# Patient Record
Sex: Male | Born: 1950 | Race: Black or African American | Hispanic: No | Smoking: Former smoker
Health system: Southern US, Community
[De-identification: ages and names within clinical notes are randomized; demographics above are authoritative.]

## PROBLEM LIST (undated history)

## (undated) ENCOUNTER — Ambulatory Visit (HOSPITAL_COMMUNITY): Admission: EM

## (undated) DIAGNOSIS — R011 Cardiac murmur, unspecified: Secondary | ICD-10-CM

## (undated) DIAGNOSIS — I10 Essential (primary) hypertension: Secondary | ICD-10-CM

## (undated) DIAGNOSIS — L932 Other local lupus erythematosus: Secondary | ICD-10-CM

## (undated) DIAGNOSIS — I509 Heart failure, unspecified: Secondary | ICD-10-CM

## (undated) DIAGNOSIS — M329 Systemic lupus erythematosus, unspecified: Secondary | ICD-10-CM

## (undated) DIAGNOSIS — N183 Chronic kidney disease, stage 3 unspecified: Secondary | ICD-10-CM

## (undated) DIAGNOSIS — E785 Hyperlipidemia, unspecified: Secondary | ICD-10-CM

## (undated) DIAGNOSIS — R972 Elevated prostate specific antigen [PSA]: Secondary | ICD-10-CM

## (undated) DIAGNOSIS — I519 Heart disease, unspecified: Secondary | ICD-10-CM

## (undated) DIAGNOSIS — I4729 Other ventricular tachycardia: Secondary | ICD-10-CM

## (undated) DIAGNOSIS — K649 Unspecified hemorrhoids: Secondary | ICD-10-CM

## (undated) DIAGNOSIS — Z87898 Personal history of other specified conditions: Secondary | ICD-10-CM

## (undated) DIAGNOSIS — R4781 Slurred speech: Secondary | ICD-10-CM

## (undated) DIAGNOSIS — E7800 Pure hypercholesterolemia, unspecified: Secondary | ICD-10-CM

## (undated) DIAGNOSIS — F1410 Cocaine abuse, uncomplicated: Secondary | ICD-10-CM

## (undated) DIAGNOSIS — N1831 Chronic kidney disease, stage 3a: Secondary | ICD-10-CM

## (undated) DIAGNOSIS — I69351 Hemiplegia and hemiparesis following cerebral infarction affecting right dominant side: Secondary | ICD-10-CM

## (undated) DIAGNOSIS — R269 Unspecified abnormalities of gait and mobility: Secondary | ICD-10-CM

## (undated) DIAGNOSIS — N401 Enlarged prostate with lower urinary tract symptoms: Secondary | ICD-10-CM

## (undated) DIAGNOSIS — F1990 Other psychoactive substance use, unspecified, uncomplicated: Secondary | ICD-10-CM

## (undated) DIAGNOSIS — R351 Nocturia: Secondary | ICD-10-CM

## (undated) DIAGNOSIS — F1010 Alcohol abuse, uncomplicated: Secondary | ICD-10-CM

## (undated) DIAGNOSIS — Z8673 Personal history of transient ischemic attack (TIA), and cerebral infarction without residual deficits: Secondary | ICD-10-CM

## (undated) DIAGNOSIS — I130 Hypertensive heart and chronic kidney disease with heart failure and stage 1 through stage 4 chronic kidney disease, or unspecified chronic kidney disease: Secondary | ICD-10-CM

## (undated) DIAGNOSIS — I472 Ventricular tachycardia: Secondary | ICD-10-CM

## (undated) DIAGNOSIS — IMO0002 Reserved for concepts with insufficient information to code with codable children: Secondary | ICD-10-CM

## (undated) DIAGNOSIS — F32A Depression, unspecified: Secondary | ICD-10-CM

## (undated) DIAGNOSIS — I639 Cerebral infarction, unspecified: Secondary | ICD-10-CM

## (undated) DIAGNOSIS — N179 Acute kidney failure, unspecified: Secondary | ICD-10-CM

## (undated) DIAGNOSIS — D649 Anemia, unspecified: Secondary | ICD-10-CM

## (undated) DIAGNOSIS — N400 Enlarged prostate without lower urinary tract symptoms: Secondary | ICD-10-CM

## (undated) HISTORY — PX: TONSILLECTOMY: SUR1361

## (undated) HISTORY — DX: Hemiplegia and hemiparesis following cerebral infarction affecting right dominant side: I69.351

## (undated) HISTORY — DX: Reserved for concepts with insufficient information to code with codable children: IMO0002

## (undated) HISTORY — DX: Chronic kidney disease, stage 3a: N18.31

## (undated) HISTORY — DX: Chronic kidney disease, stage 3 unspecified: N18.30

## (undated) HISTORY — DX: Personal history of other specified conditions: Z87.898

## (undated) HISTORY — DX: Cardiac murmur, unspecified: R01.1

## (undated) HISTORY — PX: PROSTATE BIOPSY: SHX241

## (undated) HISTORY — DX: Hyperlipidemia, unspecified: E78.5

## (undated) HISTORY — PX: KNEE ARTHROSCOPY: SHX127

## (undated) HISTORY — DX: Hypertensive heart and chronic kidney disease with heart failure and stage 1 through stage 4 chronic kidney disease, or unspecified chronic kidney disease: I13.0

## (undated) HISTORY — DX: Unspecified hemorrhoids: K64.9

## (undated) HISTORY — DX: Heart failure, unspecified: I50.9

## (undated) HISTORY — DX: Systemic lupus erythematosus, unspecified: M32.9

## (undated) HISTORY — DX: Benign prostatic hyperplasia without lower urinary tract symptoms: N40.0

---
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
## 2001-03-11 ENCOUNTER — Emergency Department (HOSPITAL_COMMUNITY): Admission: EM | Admit: 2001-03-11 | Discharge: 2001-03-11 | Payer: Self-pay | Admitting: Emergency Medicine

## 2001-03-11 ENCOUNTER — Encounter: Payer: Self-pay | Admitting: Emergency Medicine

## 2002-04-17 ENCOUNTER — Emergency Department (HOSPITAL_COMMUNITY): Admission: EM | Admit: 2002-04-17 | Discharge: 2002-04-17 | Payer: Self-pay | Admitting: Emergency Medicine

## 2012-05-21 DIAGNOSIS — J45909 Unspecified asthma, uncomplicated: Secondary | ICD-10-CM

## 2012-05-21 HISTORY — DX: Unspecified asthma, uncomplicated: J45.909

## 2013-03-05 ENCOUNTER — Ambulatory Visit (INDEPENDENT_AMBULATORY_CARE_PROVIDER_SITE_OTHER): Payer: BC Managed Care – PPO | Admitting: Internal Medicine

## 2013-03-19 ENCOUNTER — Ambulatory Visit (INDEPENDENT_AMBULATORY_CARE_PROVIDER_SITE_OTHER): Payer: BC Managed Care – PPO | Admitting: Internal Medicine

## 2013-03-19 ENCOUNTER — Encounter: Payer: Self-pay | Admitting: Internal Medicine

## 2013-03-19 ENCOUNTER — Encounter (HOSPITAL_COMMUNITY): Payer: Self-pay | Admitting: *Deleted

## 2013-03-19 VITALS — BP 140/70 | HR 74 | Ht 74.0 in | Wt 207.7 lb

## 2013-03-19 DIAGNOSIS — I517 Cardiomegaly: Secondary | ICD-10-CM

## 2013-03-19 DIAGNOSIS — R011 Cardiac murmur, unspecified: Secondary | ICD-10-CM

## 2013-03-19 DIAGNOSIS — R9431 Abnormal electrocardiogram [ECG] [EKG]: Secondary | ICD-10-CM

## 2013-03-19 DIAGNOSIS — Z01810 Encounter for preprocedural cardiovascular examination: Secondary | ICD-10-CM

## 2013-03-19 NOTE — Progress Notes (Signed)
OFFICE NOTE  Chief Complaint:  Preoperative cardiovascular exam, murmur  Primary Care Physician: Irven Shelling, MD  HPI:  Isaac Reyes is a pleasant 62 year old male who was recently scheduled for foot surgery with Dr. Doran Durand. During preoperative evaluation he was noted to have an abnormal EKG showing possible ischemic changes, premature atrial contractions and was noted to be hypertensive. History was canceled and he was referred for evaluation in our office. Isaac Reyes has no significant past medical history that he reports. There is a family history of heart disease in his father died of heart attack at age 61 and also has hypertension. His brother also died but not of heart disease. He denies any chest pain or worsening shortness of breath recently and has been told that he has high blood pressure intermittently in the past. He underwent an EKG preoperatively which showed left ventricular hypertrophy, PACs, and inferior lateral T-wave inversions. This is corroborated with his EKG today suggesting possible global ischemia or apical variant hypertrophic cardiomyopathy. He was also heard to have a cardiac murmur and is here for evaluation of that. He does report being very active and denies any shortness of breath with exertion as mentioned or limiting symptoms. 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  PMHx:  Past Medical History  Diagnosis Date  . Lupus     Past Surgical History  Procedure Laterality Date  . Knee surgery      FAMHx:  Family History  Problem Relation Age of Onset  . Heart attack Father     SOCHx:   reports that he has quit smoking. His smoking use included Cigars. He has never used smokeless tobacco. He reports that he drinks about 1.0 ounces of alcohol per week. He reports that he does not use illicit drugs.  ALLERGIES:  No Known Allergies  ROS: A comprehensive review of systems was negative except for:  Musculoskeletal: positive for bunions, foot pain, LE swelling  HOME MEDS: Current Outpatient Prescriptions  Medication Sig Dispense Refill  . Acetaminophen (TYLENOL PO) Take by mouth as needed.       No current facility-administered medications for this visit.    LABS/IMAGING: No results found for this or any previous visit (from the past 48 hour(s)). No results found.  VITALS: BP 140/70  Pulse 74  Ht 6\' 2"  (1.88 m)  Wt 207 lb 11.2 oz (94.212 kg)  BMI 26.66 kg/m2  EXAM: General appearance: alert and no distress Neck: no carotid bruit and no JVD Lungs: clear to auscultation bilaterally Heart: regular rate and rhythm, S1, S2 normal and systolic murmur: early systolic 2/6, blowing at apex Abdomen: soft, non-tender; bowel sounds normal; no masses,  no organomegaly Extremities: extremities normal, atraumatic, no cyanosis or edema Pulses: 2+ and symmetric Skin: Skin color, texture, turgor normal. No rashes or lesions Neurologic: Grossly normal Psych: Mood, affect normal  EKG: Normal sinus rhythm at 74, full criteria for LVH, inferior and lateral T-wave inversions  ASSESSMENT: 1. Systolic murmur heard best at the apex 2. Abnormal EKG suggesting ischemia versus apical variant hypertrophic cardiomyopathy 3. Probable undiagnosed hypertension 4. Indeterminate preoperative cardiovascular risk  PLAN: 1.   Isaac Reyes has an EKG which is markedly abnormal, suggesting either hypertensive heart disease, apical variant hypertrophic cardiomyopathy or ischemia. He has not had ischemic symptoms, however an ischemia workup is indicated preoperatively. I would recommend a LexiScan nuclear stress test as he has difficulty walking with his foot problems. In addition,  an echocardiogram will be helpful to assess for structural changes of the heart and/or hypertrophy. He may need treatment with an ACE inhibitor or ARB as his blood pressure I suspect has been elevated for some time. We'll plan to see  him back in a few weeks to discuss results of his tests and re\re check his blood pressure at that time. He continues to be elevated, again I would recommend starting medication.  Thanks again for the kind referral. Please don't hesitate to contact me with questions.  Pixie Casino, MD, [facility] Attending Cardiologist [facility]  Aundrea Horace C 03/19/2013, 11:22 AM

## 2013-03-19 NOTE — Patient Instructions (Addendum)
Your physician has requested that you have a lexiscan myoview. For further information please visit [email or web address]. Please follow instruction sheet, as given.  Your physician has requested that you have an echocardiogram. Echocardiography is a painless test that uses sound waves to create images of your heart. It provides your doctor with information about the size and shape of your heart and how well your heart's chambers and valves are working. This procedure takes approximately one hour. There are no restrictions for this procedure.  These tests are both done in our office.  Please schedule a follow up visit with Dr. Debara Pickett after your tests.

## 2013-04-03 ENCOUNTER — Ambulatory Visit (HOSPITAL_COMMUNITY)
Admission: RE | Admit: 2013-04-03 | Discharge: 2013-04-03 | Disposition: A | Discharge status: Home / Self Care | Payer: BC Managed Care – PPO | Source: Ambulatory Visit | Attending: Cardiovascular Disease | Admitting: Cardiovascular Disease

## 2013-04-03 DIAGNOSIS — Z01810 Encounter for preprocedural cardiovascular examination: Secondary | ICD-10-CM

## 2013-04-03 DIAGNOSIS — R9431 Abnormal electrocardiogram [ECG] [EKG]: Secondary | ICD-10-CM | POA: Insufficient documentation

## 2013-04-03 MED ORDER — TECHNETIUM TC 99M SESTAMIBI GENERIC - CARDIOLITE
30.2000 | Freq: Once | INTRAVENOUS | Status: AC | PRN
  Administered 2013-04-03: 30.2 via INTRAVENOUS

## 2013-04-03 MED ORDER — REGADENOSON 0.4 MG/5ML IV SOLN
0.4000 mg | Freq: Once | INTRAVENOUS | Status: AC
  Administered 2013-04-03: 0.4 mg via INTRAVENOUS

## 2013-04-03 MED ORDER — TECHNETIUM TC 99M SESTAMIBI GENERIC - CARDIOLITE
10.4000 | Freq: Once | INTRAVENOUS | Status: AC | PRN
  Administered 2013-04-03: 10 via INTRAVENOUS

## 2013-04-03 NOTE — Procedures (Addendum)
[place] [address] [place] [id number]  Cardiology Nuclear Med Study  Isaac Reyes is a 62 y.o. male     MRN : [id number]     DOB: [birth year]  Procedure Date: 04/03/2013  Nuclear Med Background Indication for Stress Test:  Surgical Clearance and Abnormal EKG History:  murmur;lupus Cardiac Risk Factors: Family History - CAD and History of Smoking  Symptoms:  Pt denies symptoms   Nuclear Pre-Procedure Caffeine/Decaff Intake:  12:00am NPO After: 10am   IV Site: R Forearm  IV 0.9% NS with Angio Cath:  22g  Chest Size (in):  42"  IV Started by: Azucena Cecil, RN  Height: 6\' 2"  (1.88 m)  Cup Size: n/a  BMI:  Body mass index is 26.57 kg/(m^2). Weight:  207 lb (93.895 kg)   Tech Comments:  n/a    Nuclear Med Study 1 or 2 day study: 1 day  Stress Test Type:  [place] Provider:  Lyman Bishop, MD   Resting Radionuclide: Technetium 62m Sestamibi  Resting Radionuclide Dose: 10.4 mCi   Stress Radionuclide:  Technetium 1m Sestamibi  Stress Radionuclide Dose: 30.2 mCi           Stress Protocol Rest HR: 67 Stress HR:  76  Rest BP: 177/114 Stress BP: 181/119  Exercise Time (min): n/a METS: n/a   Predicted Max HR: 158 bpm % Max HR: 53.8 bpm Rate Pressure Product: 15385  Dose of Adenosine (mg):  n/a Dose of Lexiscan: 0.4 mg  Dose of Atropine (mg): n/a Dose of Dobutamine: n/a mcg/kg/min (at max HR)  Stress Test Technologist: Leane Para, CCT Nuclear Technologist: Imagene Riches, CNMT   Rest Procedure:  Myocardial perfusion imaging was performed at rest 45 minutes following the intravenous administration of Technetium 52m Sestamibi. Stress Procedure:  The patient received IV Lexiscan 0.4 mg over 15-seconds.  Technetium 36m Sestamibi injected at 30-seconds.  There were no significant changes with Lexiscan.  Quantitative spect images were obtained after a 45 minute  delay.  Transient Ischemic Dilatation (Normal <1.22):  1.23 Lung/Heart Ratio (Normal <0.45):  0.24 QGS EDV:  231 ml QGS ESV:  142 ml LV Ejection Fraction: 39%   Rest ECG: NSR-LVH  Stress ECG: No significant change from baseline ECG  QPS Raw Data Images:  Marked cardiomegaly is present. Mild diaphragmatic attenuation is seen. Stress Images:  Normal homogeneous uptake in all areas of the myocardium. Rest Images:  Normal homogeneous uptake in all areas of the myocardium. Subtraction (SDS):  No evidence of ischemia. LV Wall Motion:  Moderate global LV hypokinesis with calculated EF of 39%  Impression Exercise Capacity:  Lexiscan with no exercise. BP Response:  Severe baseline hypertension Clinical Symptoms:  No significant symptoms noted. ECG Impression:  No significant ECG changes with Lexiscan. 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   Sanda Klein, MD  04/03/2013 5:23 PM

## 2013-04-06 ENCOUNTER — Ambulatory Visit (HOSPITAL_COMMUNITY)
Admission: RE | Admit: 2013-04-06 | Discharge: 2013-04-06 | Disposition: A | Discharge status: Home / Self Care | Payer: BC Managed Care – PPO | Source: Ambulatory Visit | Attending: Cardiology | Admitting: Cardiology

## 2013-04-06 DIAGNOSIS — Z01810 Encounter for preprocedural cardiovascular examination: Secondary | ICD-10-CM

## 2013-04-06 DIAGNOSIS — R9431 Abnormal electrocardiogram [ECG] [EKG]: Secondary | ICD-10-CM | POA: Insufficient documentation

## 2013-04-06 NOTE — Progress Notes (Signed)
2D Echo Performed 04/06/2013    Marygrace Drought, RCS

## 2013-04-15 ENCOUNTER — Ambulatory Visit: Payer: BC Managed Care – PPO | Admitting: Internal Medicine

## 2013-04-22 NOTE — Progress Notes (Signed)
Left message with patient's mother for him to return call.

## 2017-06-21 DIAGNOSIS — Z8679 Personal history of other diseases of the circulatory system: Secondary | ICD-10-CM

## 2017-06-21 DIAGNOSIS — I639 Cerebral infarction, unspecified: Secondary | ICD-10-CM

## 2017-06-21 HISTORY — DX: Cerebral infarction, unspecified: I63.9

## 2017-06-21 HISTORY — DX: Personal history of other diseases of the circulatory system: Z86.79

## 2017-07-17 ENCOUNTER — Other Ambulatory Visit: Payer: Self-pay

## 2017-07-17 ENCOUNTER — Emergency Department (HOSPITAL_COMMUNITY): Payer: Medicare Other

## 2017-07-17 ENCOUNTER — Inpatient Hospital Stay (HOSPITAL_COMMUNITY)
Admission: EM | Admit: 2017-07-17 | Discharge: 2017-07-19 | DRG: 065 | Disposition: A | Discharge status: Home / Self Care | Payer: Medicare Other | Attending: Internal Medicine | Admitting: Internal Medicine

## 2017-07-17 ENCOUNTER — Encounter (HOSPITAL_COMMUNITY): Payer: Self-pay | Admitting: *Deleted

## 2017-07-17 DIAGNOSIS — E440 Moderate protein-calorie malnutrition: Secondary | ICD-10-CM | POA: Diagnosis present

## 2017-07-17 DIAGNOSIS — D649 Anemia, unspecified: Secondary | ICD-10-CM

## 2017-07-17 DIAGNOSIS — R296 Repeated falls: Secondary | ICD-10-CM | POA: Diagnosis not present

## 2017-07-17 DIAGNOSIS — G8194 Hemiplegia, unspecified affecting left nondominant side: Secondary | ICD-10-CM | POA: Diagnosis not present

## 2017-07-17 DIAGNOSIS — I472 Ventricular tachycardia: Secondary | ICD-10-CM | POA: Diagnosis present

## 2017-07-17 DIAGNOSIS — R4701 Aphasia: Secondary | ICD-10-CM | POA: Diagnosis present

## 2017-07-17 DIAGNOSIS — N289 Disorder of kidney and ureter, unspecified: Secondary | ICD-10-CM | POA: Diagnosis not present

## 2017-07-17 DIAGNOSIS — R030 Elevated blood-pressure reading, without diagnosis of hypertension: Secondary | ICD-10-CM | POA: Diagnosis present

## 2017-07-17 DIAGNOSIS — Z8673 Personal history of transient ischemic attack (TIA), and cerebral infarction without residual deficits: Secondary | ICD-10-CM

## 2017-07-17 DIAGNOSIS — I639 Cerebral infarction, unspecified: Secondary | ICD-10-CM | POA: Diagnosis not present

## 2017-07-17 DIAGNOSIS — E785 Hyperlipidemia, unspecified: Secondary | ICD-10-CM | POA: Diagnosis not present

## 2017-07-17 DIAGNOSIS — F17200 Nicotine dependence, unspecified, uncomplicated: Secondary | ICD-10-CM | POA: Diagnosis present

## 2017-07-17 DIAGNOSIS — Z8249 Family history of ischemic heart disease and other diseases of the circulatory system: Secondary | ICD-10-CM

## 2017-07-17 DIAGNOSIS — R4781 Slurred speech: Secondary | ICD-10-CM | POA: Diagnosis not present

## 2017-07-17 DIAGNOSIS — R471 Dysarthria and anarthria: Secondary | ICD-10-CM | POA: Diagnosis not present

## 2017-07-17 DIAGNOSIS — I4729 Other ventricular tachycardia: Secondary | ICD-10-CM

## 2017-07-17 DIAGNOSIS — R7303 Prediabetes: Secondary | ICD-10-CM | POA: Diagnosis not present

## 2017-07-17 DIAGNOSIS — I10 Essential (primary) hypertension: Secondary | ICD-10-CM | POA: Diagnosis not present

## 2017-07-17 DIAGNOSIS — F1010 Alcohol abuse, uncomplicated: Secondary | ICD-10-CM | POA: Diagnosis not present

## 2017-07-17 HISTORY — DX: Other ventricular tachycardia: I47.29

## 2017-07-17 HISTORY — DX: Ventricular tachycardia: I47.2

## 2017-07-17 HISTORY — DX: Anemia, unspecified: D64.9

## 2017-07-17 HISTORY — DX: Cerebral infarction, unspecified: I63.9

## 2017-07-17 LAB — BASIC METABOLIC PANEL
ANION GAP: 10 (ref 5–15)
BUN: 16 mg/dL (ref 6–20)
CHLORIDE: 101 mmol/L (ref 101–111)
CO2: 27 mmol/L (ref 22–32)
CREATININE: 1.3 mg/dL — AB (ref 0.61–1.24)
Calcium: 9.2 mg/dL (ref 8.9–10.3)
GFR calc Af Amer: >60 mL/min (ref >60)
GFR calc non Af Amer: 55 mL/min — ABNORMAL LOW (ref >60)
Glucose, Bld: 101 mg/dL — ABNORMAL HIGH (ref 65–99)
Potassium: 3.6 mmol/L (ref 3.5–5.1)
SODIUM: 138 mmol/L (ref 135–145)

## 2017-07-17 LAB — CBC WITH DIFFERENTIAL/PLATELET
Basophils Absolute: 0 10*3/uL (ref 0.0–0.1)
Basophils Relative: 1 %
EOS ABS: 0.2 10*3/uL (ref 0.0–0.7)
Eosinophils Relative: 6 %
HCT: 39.6 % (ref 39.0–52.0)
Hemoglobin: 12.4 g/dL — ABNORMAL LOW (ref 13.0–17.0)
LYMPHS ABS: 1.3 10*3/uL (ref 0.7–4.0)
Lymphocytes Relative: 38 %
MCH: 25.9 pg — AB (ref 26.0–34.0)
MCHC: 31.3 g/dL (ref 30.0–36.0)
MCV: 82.7 fL (ref 78.0–100.0)
Monocytes Absolute: 0.3 10*3/uL (ref 0.1–1.0)
Monocytes Relative: 10 %
NEUTROS PCT: 45 %
Neutro Abs: 1.6 10*3/uL — ABNORMAL LOW (ref 1.7–7.7)
Platelets: 175 10*3/uL (ref 150–400)
RBC: 4.79 MIL/uL (ref 4.22–5.81)
RDW: 15.2 % (ref 11.5–15.5)
WBC: 3.4 10*3/uL — ABNORMAL LOW (ref 4.0–10.5)

## 2017-07-17 LAB — URINALYSIS, ROUTINE W REFLEX MICROSCOPIC
Bilirubin Urine: NEGATIVE
Glucose, UA: NEGATIVE mg/dL
Hgb urine dipstick: NEGATIVE
Ketones, ur: NEGATIVE mg/dL
Leukocytes, UA: NEGATIVE
NITRITE: NEGATIVE
Protein, ur: NEGATIVE mg/dL
SPECIFIC GRAVITY, URINE: 1.015 (ref 1.005–1.030)
pH: 6 (ref 5.0–8.0)

## 2017-07-17 NOTE — ED Notes (Signed)
Results reviewed, no change in acuity at this time

## 2017-07-17 NOTE — Discharge Instructions (Addendum)
Fall Prevention in the Home Falls can cause injuries. They can happen to people of all ages. There are many things you can do to make your home safe and to help prevent falls. What can I do on the outside of my home?  Regularly fix the edges of walkways and driveways and fix any cracks.  Remove anything that might make you trip as you walk through a door, such as a raised step or threshold.  Trim any bushes or trees on the path to your home.  Use bright outdoor lighting.  Clear any walking paths of anything that might make someone trip, such as rocks or tools.  Regularly check to see if handrails are loose or broken. Make sure that both sides of any steps have handrails.  Any raised decks and porches should have guardrails on the edges.  Have any leaves, snow, or ice cleared regularly.  Use sand or salt on walking paths during winter.  Clean up any spills in your garage right away. This includes oil or grease spills. What can I do in the bathroom?  Use night lights.  Install grab bars by the toilet and in the tub and shower. Do not use towel bars as grab bars.  Use non-skid mats or decals in the tub or shower.  If you need to sit down in the shower, use a plastic, non-slip stool.  Keep the floor dry. Clean up any water that spills on the floor as soon as it happens.  Remove soap buildup in the tub or shower regularly.  Attach bath mats securely with double-sided non-slip rug tape.  Do not have throw rugs and other things on the floor that can make you trip. What can I do in the bedroom?  Use night lights.  Make sure that you have a light by your bed that is easy to reach.  Do not use any sheets or blankets that are too big for your bed. They should not hang down onto the floor.  Have a firm chair that has side arms. You can use this for support while you get dressed.  Do not have throw rugs and other things on the floor that can make you trip. What can I do in  the kitchen?  Clean up any spills right away.  Avoid walking on wet floors.  Keep items that you use a lot in easy-to-reach places.  If you need to reach something above you, use a strong step stool that has a grab bar.  Keep electrical cords out of the way.  Do not use floor polish or wax that makes floors slippery. If you must use wax, use non-skid floor wax.  Do not have throw rugs and other things on the floor that can make you trip. What can I do with my stairs?  Do not leave any items on the stairs.  Make sure that there are handrails on both sides of the stairs and use them. Fix handrails that are broken or loose. Make sure that handrails are as long as the stairways.  Check any carpeting to make sure that it is firmly attached to the stairs. Fix any carpet that is loose or worn.  Avoid having throw rugs at the top or bottom of the stairs. If you do have throw rugs, attach them to the floor with carpet tape.  Make sure that you have a light switch at the top of the stairs and the bottom of the stairs. If you  do not have them, ask someone to add them for you. What else can I do to help prevent falls?  Wear shoes that: ? Do not have high heels. ? Have rubber bottoms. ? Are comfortable and fit you well. ? Are closed at the toe. Do not wear sandals.  If you use a stepladder: ? Make sure that it is fully opened. Do not climb a closed stepladder. ? Make sure that both sides of the stepladder are locked into place. ? Ask someone to hold it for you, if possible.  Clearly mark and make sure that you can see: ? Any grab bars or handrails. ? First and last steps. ? Where the edge of each step is.  Use tools that help you move around (mobility aids) if they are needed. These include: ? Canes. ? Walkers. ? Scooters. ? Crutches.  Turn on the lights when you go into a dark area. Replace any light bulbs as soon as they burn out.  Set up your furniture so you have a clear  path. Avoid moving your furniture around.  If any of your floors are uneven, fix them.  If there are any pets around you, be aware of where they are.  Review your medicines with your doctor. Some medicines can make you feel dizzy. This can increase your chance of falling. Ask your doctor what other things that you can do to help prevent falls. This information is not intended to replace advice given to you by your health care provider. Make sure you discuss any questions you have with your health care provider. Document Released: 03/03/2009 Document Revised: 10/13/2015 Document Reviewed: 06/11/2014 Elsevier Interactive Patient Education  2018 [employer].   Ischemic Stroke An ischemic stroke is the sudden death of brain tissue. Blood carries oxygen to all areas of the body. This type of stroke happens when your blood does not flow to your brain like normal. Your brain cannot get the oxygen it needs. This is an emergency. It must be treated right away. Symptoms of a stroke usually happen all of a sudden. You may notice them when you wake up. They can include:  Weakness or loss of feeling in your face, arm, or leg. This often happens on one side of the body.  Trouble walking.  Trouble moving your arms or legs.  Loss of balance or coordination.  Feeling confused.  Trouble talking or understanding what people are saying.  Slurred speech.  Trouble seeing.  Seeing two of one object (double vision).  Feeling dizzy.  Feeling sick to your stomach (nauseous) and throwing up (vomiting).  A very bad headache for no reason.  Get help as soon as any of these problems start. This is important. Some treatments work better if they are given right away. These include:  Aspirin.  Medicines to control blood pressure.  A shot (injection) of medicine to break up the blood clot.  Treatments given in the blood vessel (artery) to take out the clot or break it up.  Other treatments may  include:  Oxygen.  Fluids given through an IV tube.  Medicines to thin out your blood.  Procedures to help your blood flow better.  What increases the risk? Certain things may make you more likely to have a stroke. Some of these are things that you can change, such as:  Being very overweight (obesity).  Smoking.  Taking birth control pills.  Not being active.  Drinking too much alcohol.  Using drugs.  Other  risk factors include:  High blood pressure.  High cholesterol.  Diabetes.  Heart disease.  Being [place] American, Native American, Hispanic, or [place] Native.  Being over age 73.  Family history of stroke.  Having had blood clots, stroke, or warning stroke (transient ischemic attack, TIA) in the past.  Sickle cell disease.  Being a woman with a history of high blood pressure in pregnancy (preeclampsia).  Migraine headache.  Sleep apnea.  Having an irregular heartbeat (atrial fibrillation).  Long-term (chronic) diseases that cause soreness and swelling (inflammation).  Disorders that affect how your blood clots.  Follow these instructions at home: Medicines  Take over-the-counter and prescription medicines only as told by your doctor.  If you were told to take aspirin or another medicine to thin your blood, take it exactly as told by your doctor. ? Taking too much of the medicine can cause bleeding. ? If you do not take enough, it may not work as well.  Know the side effects of your medicines. If you are taking a blood thinner, make sure you: ? Hold pressure over any cuts for longer than usual. ? Tell your dentist and other doctors that you take this medicine. ? Avoid activities that may cause damage or injury to your body. Eating and drinking  Follow instructions from your doctor about what you cannot eat or drink.  Eat healthy foods.  If you have trouble with swallowing, do these things to avoid choking: ? Take small bites when  eating. ? Eat foods that are soft or pureed. Safety  Follow instructions from your health care team about physical activity.  Use a walker or cane as told by your doctor.  Keep your home safe so you do not fall. This may include: ? Having experts look at your home to make sure it is safe. ? Putting grab bars in the bedroom and bathroom. ? Using raised toilets. ? Putting a seat in the shower. General instructions  Do not use any tobacco products. ? Examples of these are cigarettes, chewing tobacco, and e-cigarettes. ? If you need help quitting, ask your doctor.  Limit how much alcohol you drink. This means no more than 1 drink a day for nonpregnant women and 2 drinks a day for men. One drink equals 12 oz of beer, 5 oz of wine, or 1 oz of hard liquor.  If you need help to stop using drugs or alcohol, ask your doctor to refer you to a program or specialist.  Stay active. Exercise as told by your doctor.  Keep all follow-up visits as told by your doctor. This is important. Get help right away if:  You suddenly: ? Have weakness or loss of feeling in your face, arm, or leg. ? Feel confused. ? Have trouble talking or understanding what people are saying. ? Have trouble seeing. ? Have trouble walking. ? Have trouble moving your arms or legs. ? Feel dizzy. ? Lose your balance or coordination. ? Have a very bad headache and you do not know why.  You pass out (lose consciousness) or almost pass out.  You have jerky movements that you cannot control (seizure). These symptoms may be an emergency. Do not wait to see if the symptoms will go away. Get medical help right away. Call your local emergency services (911 in the [place] Do not drive yourself to the hospital. This information is not intended to replace advice given to you by your health care provider. Make sure you discuss any questions  you have with your health care provider. Document Released: 04/26/2011 Document Revised:  10/18/2015 Document Reviewed: 08/03/2015 Elsevier Interactive Patient Education  2018 [employer].   Stroke Prevention Some health problems and behaviors may make it more likely for you to have a stroke. Below are ways to lessen your risk of having a stroke.  Be active for at least 30 minutes on most or all days.  Do not smoke. Try not to be around others who smoke.  Do not drink too much alcohol. ? Do not have more than 2 drinks a day if you are a man. ? Do not have more than 1 drink a day if you are a woman and are not pregnant.  Eat healthy foods, such as fruits and vegetables. If you were put on a specific diet, follow the diet as told.  Keep your cholesterol levels under control through diet and medicines. Look for foods that are low in saturated fat, trans fat, cholesterol, and are high in fiber.  If you have diabetes, follow all diet plans and take your medicine as told.  Ask your doctor if you need treatment to lower your blood pressure. If you have high blood pressure (hypertension), follow all diet plans and take your medicine as told by your doctor.  If you are 76-83 years old, have your blood pressure checked every 3-5 years. If you are age 58 or older, have your blood pressure checked every year.  Keep a healthy weight. Eat foods that are low in calories, salt, saturated fat, trans fat, and cholesterol.  Do not take drugs.  Avoid birth control pills, if this applies. Talk to your doctor about the risks of taking birth control pills.  Talk to your doctor if you have sleep problems (sleep apnea).  Take all medicine as told by your doctor. ? You may be told to take aspirin or blood thinner medicine. Take this medicine as told by your doctor. ? Understand your medicine instructions.  Make sure any other conditions you have are being taken care of.  Get help right away if:  You suddenly lose feeling (you feel numb) or have weakness in your face, arm, or leg.  Your  face or eyelid hangs down to one side.  You suddenly feel confused.  You have trouble talking (aphasia) or understanding what people are saying.  You suddenly have trouble seeing in one or both eyes.  You suddenly have trouble walking.  You are dizzy.  You lose your balance or your movements are clumsy (uncoordinated).  You suddenly have a very bad headache and you do not know the cause.  You have new chest pain.  Your heart feels like it is fluttering or skipping a beat (irregular heartbeat). Do not wait to see if the symptoms above go away. Get help right away. Call your local emergency services (911 in [place] Do not drive yourself to the hospital. This information is not intended to replace advice given to you by your health care provider. Make sure you discuss any questions you have with your health care provider. Document Released: 11/06/2011 Document Revised: 10/13/2015 Document Reviewed: 11/07/2012 Elsevier Interactive Patient Education  [employer]. Please follow up with your PCP and a neurologist.  You need to have further studies done to see if we can do something to prevent further strokes.  Return for any worsening symptoms

## 2017-07-17 NOTE — ED Provider Notes (Signed)
[facility] EMERGENCY DEPARTMENT Provider Note   CSN: [id number] Arrival date & time: 07/17/17  1826     History   Chief Complaint Chief Complaint  Patient presents with  . Aphasia    x 1 year  . Fall    2 weeks ago    HPI Isaac Reyes is a 67 y.o. male.  67 yo M with a chief complaint of slurred speech and increased falls.  The patient has had difficulty with speech for at least a year.  He states that he knew he had a stroke but did not seek care.  He has been falling more often over the past week or so.  His friend convinced him that he needed to come to the emergency department.  He denies chest pain or shortness of breath.  Denies abdominal pain.  Denies difficulty with swallowing.  Denies unilateral numbness or weakness.  Denies headache.  Denies neck pain.   The history is provided by the patient.  Fall  This is a new problem. The current episode started more than 1 week ago. The problem occurs constantly. The problem has been gradually worsening. Pertinent negatives include no chest pain, no abdominal pain, no headaches and no shortness of breath. Nothing aggravates the symptoms. Nothing relieves the symptoms. He has tried nothing for the symptoms. The treatment provided no relief.    Past Medical History:  Diagnosis Date  . Lupus     Patient Active Problem List   Diagnosis Date Noted  . Abnormal EKG 03/19/2013  . Pre-operative cardiovascular exam, new EKG abnormalities c/w ischemia 03/19/2013  . LVH (left ventricular hypertrophy) 03/19/2013  . Murmur, cardiac 03/19/2013    Past Surgical History:  Procedure Laterality Date  . KNEE SURGERY         Home Medications    Prior to Admission medications   Medication Sig Start Date End Date Taking? Authorizing Provider  Acetaminophen (TYLENOL PO) Take by mouth as needed.    [provider]    Family History Family History  Problem Relation Age of Onset  . Heart attack Father      Social History Social History   Tobacco Use  . Smoking status: Former Smoker    Years: 22.00    Types: Cigars  . Smokeless tobacco: Never Used  Substance Use Topics  . Alcohol use: Yes    Alcohol/week: 1.0 - 1.5 oz    Types: 2 - 3 Standard drinks or equivalent per week  . Drug use: No     Allergies   Patient has no known allergies.   Review of Systems Review of Systems  Constitutional: Negative for chills and fever.  HENT: Negative for congestion and facial swelling.   Eyes: Negative for discharge and visual disturbance.  Respiratory: Negative for shortness of breath.   Cardiovascular: Negative for chest pain and palpitations.  Gastrointestinal: Negative for abdominal pain, diarrhea and vomiting.  Musculoskeletal: Negative for arthralgias and myalgias.  Skin: Negative for color change and rash.  Neurological: Positive for facial asymmetry, speech difficulty and weakness. Negative for tremors, syncope and headaches.  Psychiatric/Behavioral: Negative for confusion and dysphoric mood.     Physical Exam Updated Vital Signs BP (!) 176/107   Pulse (!) 128   Temp 98.4 F (36.9 C) (Oral)   Resp 15   Ht 6\' 2"  (1.88 m)   Wt 81.6 kg (180 lb)   SpO2 100%   BMI 23.11 kg/m   Physical Exam  Constitutional:  He is oriented to person, place, and time. He appears well-developed and well-nourished.  HENT:  Head: Normocephalic and atraumatic.  Eyes: EOM are normal. Pupils are equal, round, and reactive to light.  Neck: Normal range of motion. Neck supple. No JVD present.  Cardiovascular: Normal rate and regular rhythm. Exam reveals no gallop and no friction rub.  No murmur heard. Pulmonary/Chest: No respiratory distress. He has no wheezes.  Abdominal: He exhibits no distension and no mass. There is no tenderness. There is no rebound and no guarding.  Musculoskeletal: Normal range of motion.  Neurological: He is alert and oriented to person, place, and time.  Slurred  speech, left upper extremity 4 out of 5 muscle strength compared to right 5 out of 5.  Right lower extremity difficulty heel to shin, wavering.  Patient is able to ambulate with a shuffling gait.  Skin: No rash noted. No pallor.  Psychiatric: He has a normal mood and affect. His behavior is normal.  Nursing note and vitals reviewed.    ED Treatments / Results  Labs (all labs ordered are listed, but only abnormal results are displayed) Labs Reviewed  BASIC METABOLIC PANEL - Abnormal; Notable for the following components:      Result Value   Glucose, Bld 101 (*)    Creatinine, Ser 1.30 (*)    GFR calc non Af Amer 55 (*)    All other components within normal limits  CBC WITH DIFFERENTIAL/PLATELET - Abnormal; Notable for the following components:   WBC 3.4 (*)    Hemoglobin 12.4 (*)    MCH 25.9 (*)    Neutro Abs 1.6 (*)    All other components within normal limits  URINALYSIS, ROUTINE W REFLEX MICROSCOPIC    EKG  EKG Interpretation  Date/Time:  Wednesday July 17 2017 18:52:48 EST Ventricular Rate:  63 PR Interval:  168 QRS Duration: 106 QT Interval:  430 QTC Calculation: 440 R Axis:   -39 Text Interpretation:  Sinus rhythm with Premature atrial complexes with Abberant conduction Left axis deviation Incomplete right bundle branch block Left ventricular hypertrophy T wave abnormality, consider lateral ischemia Abnormal ECG No old tracing to compare Confirmed by Delora Fuel ([id number]) on 07/17/2017 10:55:37 PM       Radiology Ct Head Wo Contrast  Result Date: 07/17/2017 CLINICAL DATA:  Several recent falls.  Gait difficulty EXAM: CT HEAD WITHOUT CONTRAST TECHNIQUE: Contiguous axial images were obtained from the base of the skull through the vertex without intravenous contrast. COMPARISON:  None. FINDINGS: Brain: The ventricles are normal in size and configuration. There is no intracranial mass hemorrhage, extra-axial fluid collection, or midline shift. There is decreased  attenuation in the medial right occipital lobe, concerning for acute/recent infarct in this area. There is extensive small vessel disease in the centra semiovale bilaterally. There are multiple lacunar infarcts in basal ganglia regions bilaterally. There is decreased attenuation in the medial right lentiform nucleus compared to the left side, concerning for recent infarct in this area. There are foci of small vessel disease in each thalamus with a focal prior infarct in the mid right thalamus. Vascular: No hyperdense vessels are evident. No appreciable vascular calcification is evident. Skull: Bony calvarium appears intact. Sinuses/Orbits: There is mucosal thickening in several ethmoid air cells. Other visualized paranasal sinuses are clear. Orbits appear symmetric bilaterally. Other: Mastoid air cells are clear. There is debris in the left external auditory canal. IMPRESSION: 1. Decreased attenuation in the medial aspect of the right occipital lobe, concerning for  recent and potentially acute infarct. 2. Extensive supratentorial small vessel disease. Multiple lacunar infarcts as well as small vessel disease are noted throughout both basal ganglia regions. There is decreased attenuation in the medial right lentiform nucleus compared to the left side, suggesting early/recent infarct in this area. Prior infarcts and small vessel disease in the thalami, more discrete on right than on the left. 3.  No mass or hemorrhage.  No extra-axial fluid collection. 4.  Mucosal thickening noted in several ethmoid air cells. 5.  Probable cerumen in the left external auditory canal. Electronically Signed   By: Lowella Grip III M.D.   On: 07/17/2017 19:57    Procedures Procedures (including critical care time)  Medications Ordered in ED Medications - No data to display   Initial Impression / Assessment and Plan / ED Course  I have reviewed the triage vital signs and the nursing notes.  Pertinent labs & imaging  results that were available during my care of the patient were reviewed by me and considered in my medical decision making (see chart for details).     67 yo M with a chief complaint of slurred speech and frequent falls.  These are somewhat subacute in nature.  The patient has a abnormal neuro exam but is difficult to ascertain how long he has had these deficits.  CT of the head was concerning for a possible acute infarct in the right occipital lobe.  I discussed the case with neurology who do not feel that his symptoms localized to that area of the brain.  The recommended an MRI.  If this is negative he feels that the rest of his workup can be completed as an outpatient.  Turned over to Dr. Roxanne Mins. Please see his note for further details.   The patients results and plan were reviewed and discussed.   Any x-rays performed were independently reviewed by myself.   Differential diagnosis were considered with the presenting HPI.  Medications - No data to display  Vitals:   07/17/17 1846 07/17/17 1847 07/17/17 2017 07/17/17 2249  BP: (!) 179/94  (!) 130/98 (!) 176/107  Pulse: (!) 45  (!) 57 (!) 128  Resp: 18  18 15   Temp: 98.4 F (36.9 C)     TempSrc: Oral     SpO2: 100%  100% 100%  Weight:  81.6 kg (180 lb)    Height:  6\' 2"  (1.88 m)      Final diagnoses:  Slurred speech  Frequent falls      Final Clinical Impressions(s) / ED Diagnoses   Final diagnoses:  Slurred speech  Frequent falls    ED Discharge Orders        Ordered    Ambulatory referral to Neurology    Comments:  Stroke, frequent falls   07/17/17 [place], [place], DO 07/17/17 2351

## 2017-07-17 NOTE — ED Triage Notes (Addendum)
Pt is here today because he feels he had a stroke 1 year ago.  He noticed his gait was off when he fell several weeks ago.  Also c/o frequent urination, sometimes waking up 10 times a night to urinate, though small amounts.  AO x 4. PT ambulated into triage without difficulty, though states he has been weak.

## 2017-07-17 NOTE — ED Provider Notes (Signed)
Patient placed in Quick Look pathway, seen and evaluated   Chief Complaint: weakness and unsteady gait  HPI:  67 y.o. male who presents to the ED with c/o feeling weak and and having unsteady gait and worsening speech. Patient reports having CVA one year ago and states that his speech has been affected since then but seems worse now. He also reports that about 2 weeks ago he fell and after that he noted his gait was different. Patient also c/o frequent urination that often wakes him up to 10 time a night and he only goes small amounts.   ROS: Neuro: speech impaired, unsteady gait.  GU: frequent urination   Physical Exam:  BP (!) 179/94 (BP Location: Right Arm)   Pulse (!) 45   Temp 98.4 F (36.9 C) (Oral)   Resp 18   Ht 6\' 2"  (1.88 m)   Wt 81.6 kg (180 lb)   SpO2 100%   BMI 23.11 kg/m    Gen: No distress  Neuro: Awake and Alert, slurred speech,   Skin: Warm and dry  Heart: bradycardia    Focused Exam:    Initiation of care has begun. The patient has been counseled on the process, plan, and necessity for staying for the completion/evaluation, and the remainder of the medical screening examination    Ashley Murrain, NP 07/17/17 Hoover [place], MD 07/18/17 2208

## 2017-07-17 NOTE — ED Notes (Signed)
Pt up at the desk asking when he is going to be roomed. Pt wanting to leave, RN encouraged Pt to stay.

## 2017-07-18 ENCOUNTER — Other Ambulatory Visit: Payer: Self-pay

## 2017-07-18 ENCOUNTER — Emergency Department (HOSPITAL_COMMUNITY): Payer: Medicare Other

## 2017-07-18 ENCOUNTER — Observation Stay (HOSPITAL_COMMUNITY): Payer: Medicare Other

## 2017-07-18 ENCOUNTER — Encounter (HOSPITAL_COMMUNITY): Payer: Self-pay | Admitting: Internal Medicine

## 2017-07-18 ENCOUNTER — Observation Stay (HOSPITAL_BASED_OUTPATIENT_CLINIC_OR_DEPARTMENT_OTHER): Payer: Medicare Other

## 2017-07-18 DIAGNOSIS — R011 Cardiac murmur, unspecified: Secondary | ICD-10-CM | POA: Diagnosis not present

## 2017-07-18 DIAGNOSIS — I6789 Other cerebrovascular disease: Secondary | ICD-10-CM

## 2017-07-18 DIAGNOSIS — F17200 Nicotine dependence, unspecified, uncomplicated: Secondary | ICD-10-CM

## 2017-07-18 DIAGNOSIS — R030 Elevated blood-pressure reading, without diagnosis of hypertension: Secondary | ICD-10-CM | POA: Diagnosis not present

## 2017-07-18 DIAGNOSIS — D649 Anemia, unspecified: Secondary | ICD-10-CM | POA: Insufficient documentation

## 2017-07-18 DIAGNOSIS — I639 Cerebral infarction, unspecified: Secondary | ICD-10-CM

## 2017-07-18 DIAGNOSIS — I472 Ventricular tachycardia: Secondary | ICD-10-CM | POA: Diagnosis not present

## 2017-07-18 DIAGNOSIS — F1010 Alcohol abuse, uncomplicated: Secondary | ICD-10-CM

## 2017-07-18 DIAGNOSIS — E440 Moderate protein-calorie malnutrition: Secondary | ICD-10-CM

## 2017-07-18 DIAGNOSIS — R4781 Slurred speech: Secondary | ICD-10-CM | POA: Diagnosis not present

## 2017-07-18 DIAGNOSIS — N289 Disorder of kidney and ureter, unspecified: Secondary | ICD-10-CM

## 2017-07-18 DIAGNOSIS — I4729 Other ventricular tachycardia: Secondary | ICD-10-CM

## 2017-07-18 DIAGNOSIS — R296 Repeated falls: Secondary | ICD-10-CM

## 2017-07-18 DIAGNOSIS — E785 Hyperlipidemia, unspecified: Secondary | ICD-10-CM

## 2017-07-18 LAB — COMPREHENSIVE METABOLIC PANEL
ALK PHOS: 99 U/L (ref 38–126)
ALT: 14 U/L — ABNORMAL LOW (ref 17–63)
ANION GAP: 11 (ref 5–15)
AST: 17 U/L (ref 15–41)
Albumin: 3.3 g/dL — ABNORMAL LOW (ref 3.5–5.0)
BUN: 13 mg/dL (ref 6–20)
CALCIUM: 8.9 mg/dL (ref 8.9–10.3)
CO2: 24 mmol/L (ref 22–32)
Chloride: 102 mmol/L (ref 101–111)
Creatinine, Ser: 1.25 mg/dL — ABNORMAL HIGH (ref 0.61–1.24)
GFR, EST NON AFRICAN AMERICAN: 58 mL/min — AB (ref >60)
Glucose, Bld: 98 mg/dL (ref 65–99)
Potassium: 3.3 mmol/L — ABNORMAL LOW (ref 3.5–5.1)
SODIUM: 137 mmol/L (ref 135–145)
Total Bilirubin: 1.1 mg/dL (ref 0.3–1.2)
Total Protein: 6.7 g/dL (ref 6.5–8.1)

## 2017-07-18 LAB — IRON AND TIBC
Iron: 60 ug/dL (ref 45–182)
Saturation Ratios: 29 % (ref 17.9–39.5)
TIBC: 204 ug/dL — ABNORMAL LOW (ref 250–450)
UIBC: 144 ug/dL

## 2017-07-18 LAB — CBC
HCT: 38.3 % — ABNORMAL LOW (ref 39.0–52.0)
HEMOGLOBIN: 12.8 g/dL — AB (ref 13.0–17.0)
MCH: 27.4 pg (ref 26.0–34.0)
MCHC: 33.4 g/dL (ref 30.0–36.0)
MCV: 82 fL (ref 78.0–100.0)
PLATELETS: 145 10*3/uL — AB (ref 150–400)
RBC: 4.67 MIL/uL (ref 4.22–5.81)
RDW: 15.5 % (ref 11.5–15.5)
WBC: 3.9 10*3/uL — ABNORMAL LOW (ref 4.0–10.5)

## 2017-07-18 LAB — RETICULOCYTES
RBC.: 4.67 MIL/uL (ref 4.22–5.81)
RETIC COUNT ABSOLUTE: 32.7 10*3/uL (ref 19.0–186.0)
RETIC CT PCT: 0.7 % (ref 0.4–3.1)

## 2017-07-18 LAB — ECHOCARDIOGRAM COMPLETE
HEIGHTINCHES: 74 in
WEIGHTICAEL: 2797.2 [oz_av]

## 2017-07-18 LAB — LIPID PANEL
CHOL/HDL RATIO: 3.1 ratio
Cholesterol: 200 mg/dL (ref 0–200)
HDL: 65 mg/dL (ref >40)
LDL Cholesterol: 124 mg/dL — ABNORMAL HIGH (ref 0–99)
Triglycerides: 56 mg/dL (ref <150)
VLDL: 11 mg/dL (ref 0–40)

## 2017-07-18 LAB — FOLATE: Folate: 15.5 ng/mL (ref >5.9)

## 2017-07-18 LAB — MAGNESIUM: MAGNESIUM: 1.9 mg/dL (ref 1.7–2.4)

## 2017-07-18 LAB — TROPONIN I: TROPONIN I: 0.07 ng/mL — AB (ref <0.03)

## 2017-07-18 LAB — HEMOGLOBIN A1C
Hgb A1c MFr Bld: 5.7 % — ABNORMAL HIGH (ref 4.8–5.6)
Mean Plasma Glucose: 116.89 mg/dL

## 2017-07-18 LAB — FERRITIN: Ferritin: 56 ng/mL (ref 24–336)

## 2017-07-18 LAB — VITAMIN B12: VITAMIN B 12: 430 pg/mL (ref 180–914)

## 2017-07-18 MED ORDER — ACETAMINOPHEN 650 MG RE SUPP: 650.0000 mg | RECTAL | Status: DC | PRN

## 2017-07-18 MED ORDER — ACETAMINOPHEN 160 MG/5ML PO SOLN: 650.0000 mg | ORAL | Status: DC | PRN

## 2017-07-18 MED ORDER — STROKE: EARLY STAGES OF RECOVERY BOOK
Freq: Once | Status: DC
  Filled 2017-07-18: qty 1

## 2017-07-18 MED ORDER — ASPIRIN 325 MG PO TABS
325.0000 mg | ORAL_TABLET | Freq: Every day | ORAL | Status: DC
  Administered 2017-07-18 – 2017-07-19 (×2): 325 mg via ORAL
  Filled 2017-07-18 (×2): qty 1

## 2017-07-18 MED ORDER — SODIUM CHLORIDE 0.9 % IV SOLN: INTRAVENOUS | Status: DC

## 2017-07-18 MED ORDER — ENSURE ENLIVE PO LIQD: 237.0000 mL | Freq: Two times a day (BID) | ORAL | Status: DC

## 2017-07-18 MED ORDER — HYDRALAZINE HCL 20 MG/ML IJ SOLN
10.0000 mg | INTRAMUSCULAR | Status: DC | PRN
  Administered 2017-07-18: 10 mg via INTRAVENOUS

## 2017-07-18 MED ORDER — ENOXAPARIN SODIUM 40 MG/0.4ML ~~LOC~~ SOLN
40.0000 mg | SUBCUTANEOUS | Status: DC
  Administered 2017-07-18: 40 mg via SUBCUTANEOUS
  Filled 2017-07-18: qty 0.4

## 2017-07-18 MED ORDER — ATORVASTATIN CALCIUM 80 MG PO TABS
80.0000 mg | ORAL_TABLET | Freq: Every day | ORAL | Status: DC
  Administered 2017-07-18: 80 mg via ORAL
  Filled 2017-07-18: qty 1

## 2017-07-18 MED ORDER — ASPIRIN 300 MG RE SUPP: 300.0000 mg | Freq: Every day | RECTAL | Status: DC

## 2017-07-18 MED ORDER — ACETAMINOPHEN 325 MG PO TABS: 650.0000 mg | ORAL_TABLET | ORAL | Status: DC | PRN

## 2017-07-18 NOTE — ED Provider Notes (Signed)
Patient signed out to me by Dr. Tyrone Nine pending MRI of the brain.  MRI shows 7 mm acute infarct in the putamen.  Case is discussed with Dr. Hal Hope of [facility] hospitalist who agrees to admit the patient.  Case also discussed with Dr. Cheral Marker of neurology service who agrees to see the patient in consultation.  Results for orders placed or performed during the hospital encounter of 62/95/28  Basic metabolic panel  Result Value Ref Range   Sodium 138 135 - 145 mmol/L   Potassium 3.6 3.5 - 5.1 mmol/L   Chloride 101 101 - 111 mmol/L   CO2 27 22 - 32 mmol/L   Glucose, Bld 101 (H) 65 - 99 mg/dL   BUN 16 6 - 20 mg/dL   Creatinine, Ser 1.30 (H) 0.61 - 1.24 mg/dL   Calcium 9.2 8.9 - 10.3 mg/dL   GFR calc non Af Amer 55 (L) >60 mL/min   GFR calc Af Amer >60 >60 mL/min   Anion gap 10 5 - 15  CBC with Differential/Platelet  Result Value Ref Range   WBC 3.4 (L) 4.0 - 10.5 K/uL   RBC 4.79 4.22 - 5.81 MIL/uL   Hemoglobin 12.4 (L) 13.0 - 17.0 g/dL   HCT 39.6 39.0 - 52.0 %   MCV 82.7 78.0 - 100.0 fL   MCH 25.9 (L) 26.0 - 34.0 pg   MCHC 31.3 30.0 - 36.0 g/dL   RDW 15.2 11.5 - 15.5 %   Platelets 175 150 - 400 K/uL   Neutrophils Relative % 45 %   Neutro Abs 1.6 (L) 1.7 - 7.7 K/uL   Lymphocytes Relative 38 %   Lymphs Abs 1.3 0.7 - 4.0 K/uL   Monocytes Relative 10 %   Monocytes Absolute 0.3 0.1 - 1.0 K/uL   Eosinophils Relative 6 %   Eosinophils Absolute 0.2 0.0 - 0.7 K/uL   Basophils Relative 1 %   Basophils Absolute 0.0 0.0 - 0.1 K/uL  Urinalysis, Routine w reflex microscopic  Result Value Ref Range   Color, Urine YELLOW YELLOW   APPearance CLEAR CLEAR   Specific Gravity, Urine 1.015 1.005 - 1.030   pH 6.0 5.0 - 8.0   Glucose, UA NEGATIVE NEGATIVE mg/dL   Hgb urine dipstick NEGATIVE NEGATIVE   Bilirubin Urine NEGATIVE NEGATIVE   Ketones, ur NEGATIVE NEGATIVE mg/dL   Protein, ur NEGATIVE NEGATIVE mg/dL   Nitrite NEGATIVE NEGATIVE   Leukocytes, UA NEGATIVE NEGATIVE   Ct Head Wo  Contrast  Result Date: 07/17/2017 CLINICAL DATA:  Several recent falls.  Gait difficulty EXAM: CT HEAD WITHOUT CONTRAST TECHNIQUE: Contiguous axial images were obtained from the base of the skull through the vertex without intravenous contrast. COMPARISON:  None. FINDINGS: Brain: The ventricles are normal in size and configuration. There is no intracranial mass hemorrhage, extra-axial fluid collection, or midline shift. There is decreased attenuation in the medial right occipital lobe, concerning for acute/recent infarct in this area. There is extensive small vessel disease in the centra semiovale bilaterally. There are multiple lacunar infarcts in basal ganglia regions bilaterally. There is decreased attenuation in the medial right lentiform nucleus compared to the left side, concerning for recent infarct in this area. There are foci of small vessel disease in each thalamus with a focal prior infarct in the mid right thalamus. Vascular: No hyperdense vessels are evident. No appreciable vascular calcification is evident. Skull: Bony calvarium appears intact. Sinuses/Orbits: There is mucosal thickening in several ethmoid air cells. Other visualized paranasal sinuses are clear.  Orbits appear symmetric bilaterally. Other: Mastoid air cells are clear. There is debris in the left external auditory canal. IMPRESSION: 1. Decreased attenuation in the medial aspect of the right occipital lobe, concerning for recent and potentially acute infarct. 2. Extensive supratentorial small vessel disease. Multiple lacunar infarcts as well as small vessel disease are noted throughout both basal ganglia regions. There is decreased attenuation in the medial right lentiform nucleus compared to the left side, suggesting early/recent infarct in this area. Prior infarcts and small vessel disease in the thalami, more discrete on right than on the left. 3.  No mass or hemorrhage.  No extra-axial fluid collection. 4.  Mucosal thickening noted  in several ethmoid air cells. 5.  Probable cerumen in the left external auditory canal. Electronically Signed   By: Lowella Grip III M.D.   On: 07/17/2017 19:57   Mr Brain Wo Contrast  Result Date: 07/18/2017 CLINICAL DATA:  67 y/o  M; slurred speech and increasing falls. EXAM: MRI HEAD WITHOUT CONTRAST TECHNIQUE: Multiplanar, multiecho pulse sequences of the brain and surrounding structures were obtained without intravenous contrast. COMPARISON:  07/17/2017 CT head. FINDINGS: Brain: 7 mm focus of reduced diffusion in the right anterior putamen compatible with acute/early subacute infarction. No associated hemorrhage or mass effect. Multiple chronic lacunar infarctions are present within the bilateral basal ganglia involving thalamus, lentiform nuclei, and caudate nuclei. Multiple small chronic periventricular infarctions. Confluent patchynonspecific foci of T2 FLAIR hyperintense signal abnormality in subcortical and periventricular white matter as well as pons are compatible withseverechronic microvascular ischemic changes for age. Moderatebrain parenchymal volume loss. Numerous foci of susceptibility hypointensity are present in a predominantly central distribution involving basal ganglia, periventricular white matter, pons, and left cerebellar hemisphere compatible with hemosiderin deposition of chronic microhemorrhage. Vascular: Normal flow voids. Skull and upper cervical spine: Normal marrow signal. Sinuses/Orbits: Negative. Other: None. IMPRESSION: 1. 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 No associated hemorrhage or mass effect. 2. Several chronic lacunar infarcts in basal ganglia and periventricular white matter. 3. Severe for age chronic microvascular ischemic changes of the brain. Moderate brain parenchymal volume loss. 4. Foci of chronic microhemorrhage with central distribution, probably related to chronic hypertension. These results were called by telephone at the  time of interpretation on 07/18/2017 at 6:96 am to Dr. Roxanne Mins, who verbally acknowledged these results. Electronically Signed   By: Kristine Garbe M.D.   On: 78/93/8101 75:10      Delora Fuel, MD 25/85/27 [phone]

## 2017-07-18 NOTE — Progress Notes (Signed)
Initial Nutrition Assessment  DOCUMENTATION CODES:   Non-severe (moderate) malnutrition in context of chronic illness  INTERVENTION:  Magic cup TID with meals, each supplement provides 290 kcal and 9 grams of protein  Hormel Shake daily with Lunch, each supplement provides 500 calories and 23 grams of protein  NUTRITION DIAGNOSIS:   Moderate Malnutrition related to chronic illness as evidenced by moderate fat depletion, severe fat depletion, moderate muscle depletion  GOAL:   Patient will meet greater than or equal to 90% of their needs  MONITOR:   PO intake, I & O's, Labs, Supplement acceptance, Weight trends  REASON FOR ASSESSMENT:   Malnutrition Screening Tool    ASSESSMENT:   Isaac Reyes is a 67 y.o. male with no significant past medical history presents to the ER with complaints of slurred speech.  Patient states he has been having difficulty speaking for last 1 year but his friends noticed that his speech has got worse acutely Presents with acute/subacute CVA  Spoke with patient at bedside. He reports decreased appetite for 1-2 years with a UBW of 220 pounds, down 25 pounds over 2 years. Discussed with RN and PT, patient has been taking care of his mom during that time span, cooking, and eating less. Normally eats eggs and toast for breakfast, meat and bread for lunch, and will normally eat out for dinner. Unclear if he is meeting calorie needs. 11.3% insignificant weight loss. Does not like ensure. Ate 100% of pulled pork sandwich, collard greens, potato wedges and banana pudding for lunch.  Labs reviewed:  K+ 3.3  Medications reviewed and include:  NS at 66mL/hr  NUTRITION - FOCUSED PHYSICAL EXAM:    Most Recent Value  Orbital Region  Moderate depletion  Upper Arm Region  Moderate depletion  Thoracic and Lumbar Region  Moderate depletion  Buccal Region  Moderate depletion  Temple Region  Moderate depletion  Clavicle Bone Region  Severe depletion   Clavicle and Acromion Bone Region  Severe depletion  Scapular Bone Region  Severe depletion  Dorsal Hand  Mild depletion  Patellar Region  Mild depletion  Anterior Thigh Region  Mild depletion  Posterior Calf Region  Mild depletion  Edema (RD Assessment)  None  Hair  Reviewed  Eyes  Reviewed  Mouth  Reviewed  Skin  Reviewed  Nails  Reviewed       Diet Order:  Fall precautions Diet Heart Room service appropriate? Yes; Fluid consistency: Thin  EDUCATION NEEDS:   Education needs have been addressed  Skin:  Skin Assessment: Reviewed RN Assessment  Last BM:  PTA  Height:   Ht Readings from Last 1 Encounters:  07/18/17 6\' 2"  (1.88 m)    Weight:   Wt Readings from Last 1 Encounters:  07/18/17 174 lb 13.2 oz (79.3 kg)    Ideal Body Weight:  86.36 kg  BMI:  Body mass index is 22.45 kg/m.  Estimated Nutritional Needs:   Kcal:  2000-2300 calories  Protein:  110-127 grams  Fluid:  2-2.3L  Satira Anis. [name], MS, RD LDN Inpatient Clinical Dietitian Pager [phone]

## 2017-07-18 NOTE — Progress Notes (Signed)
Occupational Therapy Evaluation Patient Details Name: Isaac Reyes MRN: [id number] DOB: 1950-09-08 Today's Date: 07/18/2017    History of Present Illness 67 y.o. male presenting to the ED with slurred speech and increased falls. 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  PMH: Lupus   Clinical Impression   PTA, pt was independent with his mobility and ADL. And IADL tasks.  Pt lives with his mother and he is her primary caregiver. Pt demosntrates difficulty with BUE integration and sensorimotor tasks, increasing his difficulty such as writing and manipulating fasteners. Pt states "I can't read what I write and that frustrates me". Recommend pt follow up with OT at the neuro outpt center. Will follow acutely.     Follow Up Recommendations  Outpatient OT(neuro outpt)    Equipment Recommendations  None recommended by OT    Recommendations for Other Services       Precautions / Restrictions Precautions Precautions: Fall      Mobility Bed Mobility Overal bed mobility: Modified Independent                Transfers Overall transfer level: Needs assistance   Transfers: Sit to/from Stand Sit to Stand: Supervision              Balance Overall balance assessment: History of Falls(will further assess)                                         ADL either performed or assessed with clinical judgement   ADL Overall ADL's : Needs assistance/impaired                                     Functional mobility during ADLs: Supervision/safety General ADL Comments: Pt overall set up with ADL tasks although demonstrates difficulty with fine motor tasks, i.e. tying, buttoning; writing     Vision Baseline Vision/History: No visual deficits Additional Comments: appears intact     Perception     Praxis      Pertinent Vitals/Pain Pain Assessment: No/denies pain     Hand Dominance (uses both)   Extremity/Trunk  Assessment Upper Extremity Assessment Upper Extremity Assessment: LUE deficits/detail;RUE deficits/detail RUE Deficits / Details: strength WFL; pt's writing has been affected; Pt states " I can't read what I write becasue my writing is so bad" RUE Coordination: decreased fine motor LUE Deficits / Details: strength WFL; pt demonstrates difficulty with sensorimotor integration (unable to tie gown) LUE Coordination: decreased fine motor   Lower Extremity Assessment Lower Extremity Assessment: Defer to PT evaluation   Cervical / Trunk Assessment Cervical / Trunk Assessment: Normal   Communication Communication Communication: Expressive difficulties   Cognition Arousal/Alertness: Awake/alert Behavior During Therapy: WFL for tasks assessed/performed Overall Cognitive Status: Within Functional Limits for tasks assessed                                     General Comments       Exercises     Shoulder Instructions      Home Living Family/patient expects to be discharged to:: Private residence Living Arrangements: Parent Available Help at Discharge: Family;Available PRN/intermittently Type of Home: House Home Access: Level entry     Home Layout: One level  Bathroom Shower/Tub: Tub/shower unit;Walk-in shower   Bathroom Toilet: Standard Bathroom Accessibility: Yes How Accessible: Accessible via walker Home Equipment: None          Prior Functioning/Environment Level of Independence: Independent        Comments: caregiver for his mother; drives; retired Armed forces operational officer        OT Problem List: Decreased coordination;Impaired UE functional use;Impaired balance (sitting and/or standing)      OT Treatment/Interventions: Self-care/ADL training;Therapeutic exercise;Neuromuscular education;Therapeutic activities;Patient/family education;Balance training    OT Goals(Current goals can be found in the care plan section) Acute Rehab OT Goals Patient Stated  Goal: to go home OT Goal Formulation: With patient Time For Goal Achievement: 08/01/17 Potential to Achieve Goals: Good  OT Frequency: Min 3X/week   Barriers to D/C:            Co-evaluation              AM-PAC PT "6 Clicks" Daily Activity     Outcome Measure Help from another person eating meals?: None Help from another person taking care of personal grooming?: None Help from another person toileting, which includes using toliet, bedpan, or urinal?: None Help from another person bathing (including washing, rinsing, drying)?: None Help from another person to put on and taking off regular upper body clothing?: A Little Help from another person to put on and taking off regular lower body clothing?: None 6 Click Score: 23   End of Session Equipment Utilized During Treatment: Gait belt Nurse Communication: Mobility status  Activity Tolerance: Patient tolerated treatment well Patient left: in bed;with call bell/phone within reach;with bed alarm set  OT Visit Diagnosis: Unsteadiness on feet (R26.81);Muscle weakness (generalized) (M62.81)                Time: 1025-8527 OT Time Calculation (min): 24 min Charges:  OT General Charges $OT Visit: 1 Visit OT Evaluation $OT Eval Low Complexity: 1 Low OT Treatments $Self Care/Home Management : 8-22 mins G-Codes:     [facility], OT/L  [phone] 07/18/2017  Isaac Reyes,Isaac Reyes 07/18/2017, 8:30 AM

## 2017-07-18 NOTE — Progress Notes (Signed)
Isaac Reyes is a 67 y.o. male with no significant past medical history presents to the ER with complaints of slurred speech.  Patient states he has been having difficulty speaking for last 1 year but his friends noticed that his speech has got worse acutely.  His friends insisted him to come to the ER and presented.  Denies any weakness of the upper or lower extremities denies any visual symptoms.  Denies any difficulty swallowing.  Has not been taking any medications.  Has not been to his PCP for many years. 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  07/18/17: patient seen and examined at his bedside. Appears to have some weakness in his left lower extremity. Mild dysarthria. Neurology following, highly appreciated.  Please refer to H&P dictated by Dr. Hal Hope on 07/18/17  for further assessment and plan.

## 2017-07-18 NOTE — Progress Notes (Signed)
  Echocardiogram 2D Echocardiogram has been performed.  Nakyia Dau T Jasia Hiltunen 07/18/2017, 10:53 AM

## 2017-07-18 NOTE — Evaluation (Addendum)
Physical Therapy Evaluation Patient Details Name: Isaac Reyes MRN: [id number] DOB: [birth year] Today's Date: 07/18/2017   History of Present Illness  67 y.o. male presenting to the ED with slurred speech and increased falls. 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  PMH: Lupus  Clinical Impression  Patient presents with dysarthria s/p above. Tolerated gait training and stair training with supervision for safety. Scored 21/24 on DGI indicating pt is not an increased risk for falls. Tolerated higher level balance activities- walking backwards, head turns, direction changes etc with only minor deviations in gait but no overt LOB. Pt denies any deficits and wants to return home. Pt cares for mother PTA and is independent. Education re: signs/symptoms of stroke and BeFAST. Per OT, pt with some UE coordination issues and difficulty writing so OP OT seems appropriate. Pt does not require skilled therapy services in the hospital as pt functioning close to baseline with regards to mobility. All education completed. Discharge from therapy.     Follow Up Recommendations No PT follow up;Supervision - Intermittent    Equipment Recommendations  None recommended by PT    Recommendations for Other Services       Precautions / Restrictions Precautions Precautions: Fall Restrictions Weight Bearing Restrictions: No      Mobility  Bed Mobility Overal bed mobility: Modified Independent             General bed mobility comments: No assist needed.   Transfers Overall transfer level: Needs assistance Equipment used: None Transfers: Sit to/from Stand Sit to Stand: Supervision         General transfer comment: Supervision for safety.   Ambulation/Gait Ambulation/Gait assistance: Supervision Ambulation Distance (Feet): 350 Feet Assistive device: None Gait Pattern/deviations: Step-through pattern;Decreased stride length;Wide base of support Gait velocity:  decreased   General Gait Details: Mostly steady gait with wide BoS. Some mild drifting noted. Tolerated higher level balance deficits- see balance section.  Stairs Stairs: Yes Stairs assistance: Supervision Stair Management: One rail Right;Alternating pattern Number of Stairs: 3(+ 2 steps x2 bouts) General stair comments: Used rail for support, good demo of technique.  Wheelchair Mobility    Modified Rankin (Stroke Patients Only) Modified Rankin (Stroke Patients Only) Pre-Morbid Rankin Score: No symptoms Modified Rankin: No significant disability     Balance Overall balance assessment: Needs assistance;History of Falls Sitting-balance support: Feet supported;No upper extremity supported Sitting balance-Leahy Scale: Good     Standing balance support: During functional activity Standing balance-Leahy Scale: Good               High level balance activites: Backward walking;Direction changes;Turns;Head turns;Sudden stops High Level Balance Comments: Tolerated above with only mild deviations in gait but no overt LOB.  Standardized Balance Assessment Standardized Balance Assessment : Dynamic Gait Index   Dynamic Gait Index Level Surface: Normal Change in Gait Speed: Normal Gait with Horizontal Head Turns: Normal Gait with Vertical Head Turns: Normal Gait and Pivot Turn: Mild Impairment Step Over Obstacle: Mild Impairment Step Around Obstacles: Normal Steps: Mild Impairment Total Score: 21       Pertinent Vitals/Pain Pain Assessment: No/denies pain    Home Living Family/patient expects to be discharged to:: Private residence Living Arrangements: Parent Available Help at Discharge: Family;Available PRN/intermittently Type of Home: House Home Access: Level entry     Home Layout: One level Home Equipment: None      Prior Function Level of Independence: Independent         Comments: caregiver for his mother;  drives; retired Therapist, occupational: (uses both)    Extremity/Trunk Assessment   Upper Extremity Assessment Upper Extremity Assessment: Defer to OT evaluation RUE Deficits / Details: strength WFL; pt's writing has been affected; Pt states " I can't read what I write becasue my writing is so bad" RUE Coordination: decreased fine motor LUE Deficits / Details: strength WFL; pt demonstrates difficulty with sensorimotor integration (unable to tie gown) LUE Coordination: decreased fine motor    Lower Extremity Assessment Lower Extremity Assessment: Overall WFL for tasks assessed    Cervical / Trunk Assessment Cervical / Trunk Assessment: Normal  Communication   Communication: Expressive difficulties  Cognition Arousal/Alertness: Awake/alert Behavior During Therapy: WFL for tasks assessed/performed Overall Cognitive Status: Within Functional Limits for tasks assessed                                        General Comments      Exercises     Assessment/Plan    PT Assessment Patent does not need any further PT services  PT Problem List         PT Treatment Interventions      PT Goals (Current goals can be found in the Care Plan section)  Acute Rehab PT Goals Patient Stated Goal: to go home PT Goal Formulation: All assessment and education complete, DC therapy    Frequency     Barriers to discharge        Co-evaluation               AM-PAC PT "6 Clicks" Daily Activity  Outcome Measure Difficulty turning over in bed (including adjusting bedclothes, sheets and blankets)?: None Difficulty moving from lying on back to sitting on the side of the bed? : None Difficulty sitting down on and standing up from a chair with arms (e.g., wheelchair, bedside commode, etc,.)?: None Help needed moving to and from a bed to chair (including a wheelchair)?: None Help needed walking in hospital room?: None Help needed climbing 3-5 steps with a railing? : A Little 6 Click  Score: 23    End of Session Equipment Utilized During Treatment: Gait belt Activity Tolerance: Patient tolerated treatment well Patient left: in bed;with call bell/phone within reach;with bed alarm set Nurse Communication: Mobility status PT Visit Diagnosis: Difficulty in walking, not elsewhere classified (R26.2)    Time: 4765-4650 PT Time Calculation (min) (ACUTE ONLY): 13 min   Charges:   PT Evaluation $PT Eval Low Complexity: 1 Low     PT G CodesWray Kearns, PT, DPT [phone]    Marguarite Arbour A Kambrea Carrasco 07/18/2017, 11:50 AM

## 2017-07-18 NOTE — Consult Note (Signed)
Referring Physician: Dr. Roxanne Mins    Chief Complaint: Slurred speech with increased falls  HPI: Isaac Reyes is an 67 y.o. male presenting to the ED with slurred speech and increased falls.  He stated on initial ED interview that he has been having difficulty with speech for at least a year, but that this has recently worsened. He stated that he had a stroke with speech deficit about 1 year ago but has not yet sought care for this. He complains of unsteady gain and feels that he has been falling more often over the past week, approximately.  His friend convinced him to be seen in the ED. He denies feeling weaker on one side than the other. Also denies sensory numbness. He has a PMHx of lupus.  EKG: sinus rhythm with PACs  MRI brain:  1. 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 No associated hemorrhage or mass effect. 2. Several chronic lacunar infarcts in basal ganglia and periventricular white matter. 3. Severe for age chronic microvascular ischemic changes of the brain. Moderate brain parenchymal volume loss. 4. Foci of chronic microhemorrhage with central distribution, probably related to chronic hypertension.  Past Medical History:  Diagnosis Date  . Lupus     Past Surgical History:  Procedure Laterality Date  . KNEE SURGERY      Family History  Problem Relation Age of Onset  . Heart attack Father    Social History:  reports that he has quit smoking. His smoking use included cigars. He quit after 22.00 years of use. he has never used smokeless tobacco. He reports that he drinks about 1.0 - 1.5 oz of alcohol per week. He reports that he does not use drugs.  Allergies: No Known Allergies  Medications: Prior to Admission: Tylenol  ROS: No chest pain, SOB, abdominal pain, chills, fever or headache. Positive for frequent urination at night with small volumes. Other ROS as per HPI.   Physical Examination: Blood pressure (!) 181/92, pulse 67,  temperature 98.4 F (36.9 C), temperature source Oral, resp. rate 16, height _0  (1.88 m), weight 81.6 kg (180 lb), SpO2 98 %.  HEENT: [place]/AT. Vitiligo patches noted Lungs: Respirations unlabored Ext: No edema  Neurologic Examination: Mental Status: Alert, oriented, thought content appropriate.  Speech fluent without evidence of aphasia.  Able to follow all commands without difficulty. Dysarthria noted Cranial Nerves: II:  Visual fields intact. PERRL  III,IV, VI: Ptosis not present, EOMI V,VII: Smile symmetric. Facial temp sensation normal bilaterally VIII: hearing normal bilaterally IX,X: Mild pharyngeal dysarthria XI: bilateral shoulder shrug XII: midline tongue extension  Motor: RUE and RLE: 5/5 LUE and LLE: 4/5 Sensory: Temp and light touch intact proximally all 4 limbs Deep Tendon Reflexes:  Some patchy asymmetry 1+ to 2+ upper and lower extremities Plantars: Right: downgoing  Left: downgoing Cerebellar: No ataxia with FNF bilaterally Gait: Deferred   Results for orders placed or performed during the hospital encounter of 07/17/17 (from the past 48 hour(s))  Urinalysis, Routine w reflex microscopic     Status: None   Collection Time: 07/17/17  7:09 PM  Result Value Ref Range   Color, Urine YELLOW YELLOW   APPearance CLEAR CLEAR   Specific Gravity, Urine 1.015 1.005 - 1.030   pH 6.0 5.0 - 8.0   Glucose, UA NEGATIVE NEGATIVE mg/dL   Hgb urine dipstick NEGATIVE NEGATIVE   Bilirubin Urine NEGATIVE NEGATIVE   Ketones, ur NEGATIVE NEGATIVE mg/dL   Protein, ur NEGATIVE NEGATIVE mg/dL   Nitrite  NEGATIVE NEGATIVE   Leukocytes, UA NEGATIVE NEGATIVE    Comment: Performed at [facility] [address], [place], [place] [address]  Basic metabolic panel     Status: Abnormal   Collection Time: 07/17/17  7:25 PM  Result Value Ref Range   Sodium 138 135 - 145 mmol/L   Potassium 3.6 3.5 - 5.1 mmol/L   Chloride 101 101 - 111 mmol/L   CO2 27 22 - 32 mmol/L   Glucose, Bld  101 (H) 65 - 99 mg/dL   BUN 16 6 - 20 mg/dL   Creatinine, Ser 1.30 (H) 0.61 - 1.24 mg/dL   Calcium 9.2 8.9 - 10.3 mg/dL   GFR calc non Af Amer 55 (L) >60 mL/min   GFR calc Af Amer >60 >60 mL/min    Comment: (NOTE) The eGFR has been calculated using the CKD EPI equation. This calculation has not been validated in all clinical situations. eGFR's persistently <60 mL/min signify possible Chronic Kidney Disease.    Anion gap 10 5 - 15    Comment: Performed at [facility] [address] [place], [place] [address]  CBC with Differential/Platelet     Status: Abnormal   Collection Time: 07/17/17  7:25 PM  Result Value Ref Range   WBC 3.4 (L) 4.0 - 10.5 K/uL   RBC 4.79 4.22 - 5.81 MIL/uL   Hemoglobin 12.4 (L) 13.0 - 17.0 g/dL   HCT 39.6 39.0 - 52.0 %   MCV 82.7 78.0 - 100.0 fL   MCH 25.9 (L) 26.0 - 34.0 pg   MCHC 31.3 30.0 - 36.0 g/dL   RDW 15.2 11.5 - 15.5 %   Platelets 175 150 - 400 K/uL   Neutrophils Relative % 45 %   Neutro Abs 1.6 (L) 1.7 - 7.7 K/uL   Lymphocytes Relative 38 %   Lymphs Abs 1.3 0.7 - 4.0 K/uL   Monocytes Relative 10 %   Monocytes Absolute 0.3 0.1 - 1.0 K/uL   Eosinophils Relative 6 %   Eosinophils Absolute 0.2 0.0 - 0.7 K/uL   Basophils Relative 1 %   Basophils Absolute 0.0 0.0 - 0.1 K/uL    Comment: Performed at [facility] [address], [place], [place] [address]   Ct Head Wo Contrast  Result Date: 07/17/2017 CLINICAL DATA:  Several recent falls.  Gait difficulty EXAM: CT HEAD WITHOUT CONTRAST TECHNIQUE: Contiguous axial images were obtained from the base of the skull through the vertex without intravenous contrast. COMPARISON:  None. FINDINGS: Brain: The ventricles are normal in size and configuration. There is no intracranial mass hemorrhage, extra-axial fluid collection, or midline shift. There is decreased attenuation in the medial right occipital lobe, concerning for acute/recent infarct in this area. There is extensive small vessel disease  in the centra semiovale bilaterally. There are multiple lacunar infarcts in basal ganglia regions bilaterally. There is decreased attenuation in the medial right lentiform nucleus compared to the left side, concerning for recent infarct in this area. There are foci of small vessel disease in each thalamus with a focal prior infarct in the mid right thalamus. Vascular: No hyperdense vessels are evident. No appreciable vascular calcification is evident. Skull: Bony calvarium appears intact. Sinuses/Orbits: There is mucosal thickening in several ethmoid air cells. Other visualized paranasal sinuses are clear. Orbits appear symmetric bilaterally. Other: Mastoid air cells are clear. There is debris in the left external auditory canal. IMPRESSION: 1. Decreased attenuation in the medial aspect of the right  occipital lobe, concerning for recent and potentially acute infarct. 2. Extensive supratentorial small vessel disease. Multiple lacunar infarcts as well as small vessel disease are noted throughout both basal ganglia regions. There is decreased attenuation in the medial right lentiform nucleus compared to the left side, suggesting early/recent infarct in this area. Prior infarcts and small vessel disease in the thalami, more discrete on right than on the left. 3.  No mass or hemorrhage.  No extra-axial fluid collection. 4.  Mucosal thickening noted in several ethmoid air cells. 5.  Probable cerumen in the left external auditory canal. Electronically Signed   By: Lowella Grip III M.D.   On: 07/17/2017 19:57   Mr Brain Wo Contrast  Result Date: 07/18/2017 CLINICAL DATA:  67 y/o  M; slurred speech and increasing falls. EXAM: MRI HEAD WITHOUT CONTRAST TECHNIQUE: Multiplanar, multiecho pulse sequences of the brain and surrounding structures were obtained without intravenous contrast. COMPARISON:  07/17/2017 CT head. FINDINGS: Brain: 7 mm focus of reduced diffusion in the right anterior putamen compatible with  acute/early subacute infarction. No associated hemorrhage or mass effect. Multiple chronic lacunar infarctions are present within the bilateral basal ganglia involving thalamus, lentiform nuclei, and caudate nuclei. Multiple small chronic periventricular infarctions. Confluent patchynonspecific foci of T2 FLAIR hyperintense signal abnormality in subcortical and periventricular white matter as well as pons are compatible withseverechronic microvascular ischemic changes for age. Moderatebrain parenchymal volume loss. Numerous foci of susceptibility hypointensity are present in a predominantly central distribution involving basal ganglia, periventricular white matter, pons, and left cerebellar hemisphere compatible with hemosiderin deposition of chronic microhemorrhage. Vascular: Normal flow voids. Skull and upper cervical spine: Normal marrow signal. Sinuses/Orbits: Negative. Other: None. IMPRESSION: 1. 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 No associated hemorrhage or mass effect. 2. Several chronic lacunar infarcts in basal ganglia and periventricular white matter. 3. Severe for age chronic microvascular ischemic changes of the brain. Moderate brain parenchymal volume loss. 4. Foci of chronic microhemorrhage with central distribution, probably related to chronic hypertension. These results were called by telephone at the time of interpretation on 07/18/2017 at 7:25 am to Dr. Roxanne Mins, who verbally acknowledged these results. Electronically Signed   By: Kristine Garbe M.D.   On: 07/18/2017 01:46    Assessment: 67 y.o. male presenting with worsened speech relative to baseline and a 2 week history of gait instability  1. 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  2. Also seen on MRI: Several chronic lacunar infarcts in basal ganglia and periventricular white matter; severe for age chronic microvascular ischemic changes of the brain;  moderate brain parenchymal volume loss; foci of chronic microhemorrhage with central distribution, probably related to chronic hypertension. 3. Exam reveals mild left hemiparesis and dysarthria 4. Stroke Risk Factors - Lupus  Plan: 1. HgbA1c, fasting lipid panel 2. MRA of the brain without contrast 3. PT consult, OT consult, Speech consult 4. Echocardiogram 5. Carotid dopplers 6. Start ASA and atorvastatin  7. Risk factor modification 8. Telemetry monitoring 9. Frequent neuro checks 10. BP management  _0  signed: Dr. Kerney Elbe 07/18/2017, 2:29 AM

## 2017-07-18 NOTE — Progress Notes (Signed)
Carotid duplex prelim: 1-39% ICA stenosis.  Dadrian Ballantine Eunice, RDMS, RVT   

## 2017-07-18 NOTE — H&P (Signed)
History and Physical    BRASEN BUNDREN BBC:[id number] DOB: 05-30-1950 DOA: 07/17/2017  PCP: [name], MD  Patient coming from: Home.  Chief Complaint: Slurred speech.  HPI: Isaac Reyes is a 67 y.o. male with no significant past medical history presents to the ER with complaints of slurred speech.  Patient states he has been having difficulty speaking for last 1 year but his friends noticed that his speech has got worse acutely.  His friends insisted him to come to the ER and presented.  Denies any weakness of the upper or lower extremities denies any visual symptoms.  Denies any difficulty swallowing.  Has not been taking any medications.  Has not been to his PCP for many years.  ED Course: In the ER on exam patient has slurred speech but otherwise is able to move all extremities 5 x 5.  No facial asymmetry.  CT of the head was showing abnormal findings concerning for acute stroke which was followed by MRI of the brain which shows acute to subacute stroke in the right anterior putamen.  Neurologist on-call was consulted.  Patient is being admitted for further stroke workup.  Review of Systems: As per HPI, rest all negative.   Past Medical History:  Diagnosis Date  . Lupus     Past Surgical History:  Procedure Laterality Date  . KNEE SURGERY       reports that he has quit smoking. His smoking use included cigars. He quit after 22.00 years of use. he has never used smokeless tobacco. He reports that he drinks about 1.0 - 1.5 oz of alcohol per week. He reports that he does not use drugs.  No Known Allergies  Family History  Problem Relation Age of Onset  . Heart attack Father     Prior to Admission medications   Medication Sig Start Date End Date Taking? Authorizing Provider  Multiple Vitamin (MULTIVITAMIN WITH MINERALS) TABS tablet Take 1 tablet by mouth daily.   Yes [provider]    Physical Exam: Vitals:   07/18/17 0000 07/18/17 0015 07/18/17 0030  07/18/17 0300  BP: (!) 181/92 (!) 164/108 (!) 180/107 (!) 182/114  Pulse: 67 66 68   Resp: 16 20 17    Temp:      TempSrc:      SpO2: 98% 99% 100%   Weight:      Height:          Constitutional: Moderately built and nourished. Vitals:   07/18/17 0000 07/18/17 0015 07/18/17 0030 07/18/17 0300  BP: (!) 181/92 (!) 164/108 (!) 180/107 (!) 182/114  Pulse: 67 66 68   Resp: 16 20 17    Temp:      TempSrc:      SpO2: 98% 99% 100%   Weight:      Height:       Eyes: Anicteric no pallor. ENMT: No discharge from the ears eyes nose or mouth. Neck: No mass felt.  No JVD appreciated. Respiratory: No rhonchi or crepitations. Cardiovascular: S1-S2 heard no murmurs appreciated. Abdomen: Soft nontender bowel sounds present. Musculoskeletal: No edema.  No joint effusion. Skin: No rash.  Skin appears warm. Neurologic: Alert awake oriented to time place and person.  Moves all extremities 5 x 5.  No facial asymmetry tongue is midline.  Pupils are equal and reacting to light. Psychiatric: Appears normal.  Normal affect.   Labs on Admission: I have personally reviewed following labs and imaging studies  CBC: Recent Labs  Lab 07/17/17  1925  WBC 3.4*  NEUTROABS 1.6*  HGB 12.4*  HCT 39.6  MCV 82.7  PLT 993   Basic Metabolic Panel: Recent Labs  Lab 07/17/17 1925  NA 138  K 3.6  CL 101  CO2 27  GLUCOSE 101*  BUN 16  CREATININE 1.30*  CALCIUM 9.2   GFR: Estimated Creatinine Clearance: 63.6 mL/min (A) (by C-G formula based on SCr of 1.3 mg/dL (H)). Liver Function Tests: No results for input(s): AST, ALT, ALKPHOS, BILITOT, PROT, ALBUMIN in the last 168 hours. No results for input(s): LIPASE, AMYLASE in the last 168 hours. No results for input(s): AMMONIA in the last 168 hours. Coagulation Profile: No results for input(s): INR, PROTIME in the last 168 hours. Cardiac Enzymes: No results for input(s): CKTOTAL, CKMB, CKMBINDEX, TROPONINI in the last 168 hours. BNP (last 3  results) No results for input(s): PROBNP in the last 8760 hours. HbA1C: No results for input(s): HGBA1C in the last 72 hours. CBG: No results for input(s): GLUCAP in the last 168 hours. Lipid Profile: No results for input(s): CHOL, HDL, LDLCALC, TRIG, CHOLHDL, LDLDIRECT in the last 72 hours. Thyroid Function Tests: No results for input(s): TSH, T4TOTAL, FREET4, T3FREE, THYROIDAB in the last 72 hours. Anemia Panel: No results for input(s): VITAMINB12, FOLATE, FERRITIN, TIBC, IRON, RETICCTPCT in the last 72 hours. Urine analysis:    Component Value Date/Time   COLORURINE YELLOW 07/17/2017 1909   APPEARANCEUR CLEAR 07/17/2017 1909   LABSPEC 1.015 07/17/2017 1909   PHURINE 6.0 07/17/2017 1909   GLUCOSEU NEGATIVE 07/17/2017 1909   HGBUR NEGATIVE 07/17/2017 1909   BILIRUBINUR NEGATIVE 07/17/2017 1909   KETONESUR NEGATIVE 07/17/2017 1909   PROTEINUR NEGATIVE 07/17/2017 1909   NITRITE NEGATIVE 07/17/2017 1909   LEUKOCYTESUR NEGATIVE 07/17/2017 1909   Sepsis Labs: @LABRCNTIP (procalcitonin:4,lacticidven:4) )No results found for this or any previous visit (from the past 240 hour(s)).   Radiological Exams on Admission: Ct Head Wo Contrast  Result Date: 07/17/2017 CLINICAL DATA:  Several recent falls.  Gait difficulty EXAM: CT HEAD WITHOUT CONTRAST TECHNIQUE: Contiguous axial images were obtained from the base of the skull through the vertex without intravenous contrast. COMPARISON:  None. FINDINGS: Brain: The ventricles are normal in size and configuration. There is no intracranial mass hemorrhage, extra-axial fluid collection, or midline shift. There is decreased attenuation in the medial right occipital lobe, concerning for acute/recent infarct in this area. There is extensive small vessel disease in the centra semiovale bilaterally. There are multiple lacunar infarcts in basal ganglia regions bilaterally. There is decreased attenuation in the medial right lentiform nucleus compared to the  left side, concerning for recent infarct in this area. There are foci of small vessel disease in each thalamus with a focal prior infarct in the mid right thalamus. Vascular: No hyperdense vessels are evident. No appreciable vascular calcification is evident. Skull: Bony calvarium appears intact. Sinuses/Orbits: There is mucosal thickening in several ethmoid air cells. Other visualized paranasal sinuses are clear. Orbits appear symmetric bilaterally. Other: Mastoid air cells are clear. There is debris in the left external auditory canal. IMPRESSION: 1. Decreased attenuation in the medial aspect of the right occipital lobe, concerning for recent and potentially acute infarct. 2. Extensive supratentorial small vessel disease. Multiple lacunar infarcts as well as small vessel disease are noted throughout both basal ganglia regions. There is decreased attenuation in the medial right lentiform nucleus compared to the left side, suggesting early/recent infarct in this area. Prior infarcts and small vessel disease in the thalami, more discrete on  right than on the left. 3.  No mass or hemorrhage.  No extra-axial fluid collection. 4.  Mucosal thickening noted in several ethmoid air cells. 5.  Probable cerumen in the left external auditory canal. Electronically Signed   By: Lowella Grip III M.D.   On: 07/17/2017 19:57   Mr Brain Wo Contrast  Result Date: 07/18/2017 CLINICAL DATA:  67 y/o  M; slurred speech and increasing falls. EXAM: MRI HEAD WITHOUT CONTRAST TECHNIQUE: Multiplanar, multiecho pulse sequences of the brain and surrounding structures were obtained without intravenous contrast. COMPARISON:  07/17/2017 CT head. FINDINGS: Brain: 7 mm focus of reduced diffusion in the right anterior putamen compatible with acute/early subacute infarction. No associated hemorrhage or mass effect. Multiple chronic lacunar infarctions are present within the bilateral basal ganglia involving thalamus, lentiform nuclei, and  caudate nuclei. Multiple small chronic periventricular infarctions. Confluent patchynonspecific foci of T2 FLAIR hyperintense signal abnormality in subcortical and periventricular white matter as well as pons are compatible withseverechronic microvascular ischemic changes for age. Moderatebrain parenchymal volume loss. Numerous foci of susceptibility hypointensity are present in a predominantly central distribution involving basal ganglia, periventricular white matter, pons, and left cerebellar hemisphere compatible with hemosiderin deposition of chronic microhemorrhage. Vascular: Normal flow voids. Skull and upper cervical spine: Normal marrow signal. Sinuses/Orbits: Negative. Other: None. IMPRESSION: 1. 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 No associated hemorrhage or mass effect. 2. Several chronic lacunar infarcts in basal ganglia and periventricular white matter. 3. Severe for age chronic microvascular ischemic changes of the brain. Moderate brain parenchymal volume loss. 4. Foci of chronic microhemorrhage with central distribution, probably related to chronic hypertension. These results were called by telephone at the time of interpretation on 07/18/2017 at 2:84 am to Dr. Roxanne Mins, who verbally acknowledged these results. Electronically Signed   By: Kristine Garbe M.D.   On: 07/18/2017 01:46    EKG: Independently reviewed.  Normal sinus rhythm.  Acute/subacute CVA  Assessment/Plan Active Problems:   Acute CVA (cerebrovascular accident) ([place])   NSVT (nonsustained ventricular tachycardia) ([facility])   Elevated blood pressure reading   Normocytic normochromic anemia    1. Acute/subacute CVA with slurred speech -appreciate neurology consult.  MRI of the brain, echocardiogram carotid Doppler has been ordered.  Patient is placed on aspirin and Lipitor.  Get physical therapy consult.  Check hemoglobin A1c and lipid panel. 2. Elevated blood pressure -MRI brain findings are  concerning for hypertension.  At this time since patient has acute stroke will allow for permissive hypertension and I have placed patient on PRN IV hydralazine for systolic blood pressure more than 220.  Closely follow blood pressure trends.  Patient eventually may need antihypertensives.  3. Nonsustained ventricular tachycardia -patient had a brief run of nonsustained ventricular tachycardia while in the ER.  Will check 2D echo magnesium levels and repeat metabolic panel.  Patient has no chest pain is asymptomatic.  Follow 2D echo results. 4. Normocytic normochromic anemia -no old labs to compare.  Will check anemia panel.  Since patient also has mildly elevated creatinine will check SPEP. 5. Mildly elevated creatinine no labs to compare.  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  Check SPEP. 6. Tobacco abuse -tobacco cessation counseling requested.   DVT prophylaxis: Lovenox. Code Status: Full code. Family Communication: Discussed with patient. Disposition Plan: Home. Consults called: Neurology. Admission status: Observation.   Rise Patience MD [facility] Pager [phone].  If 7PM-7AM, please contact night-coverage [email or web address] Password [facility]  07/18/2017, 4:42  AM

## 2017-07-18 NOTE — Progress Notes (Signed)
  Echocardiogram 2D Echocardiogram has been performed.  Isaac Reyes Isaac Reyes 07/18/2017, 10:53 AM

## 2017-07-18 NOTE — Progress Notes (Signed)
Critical lab Troponin 0.07 received. MD notified

## 2017-07-18 NOTE — Progress Notes (Signed)
Pt Isaac Reyes arrived at the unit at about 0655, he is alert and oriented X4.BP= 170/92, other values are within normal  limits.He was oriented to his room environment.

## 2017-07-18 NOTE — Progress Notes (Addendum)
NEUROHOSPITALISTS STROKE TEAM - DAILY PROGRESS NOTE   ADMISSION HISTORY:  Isaac Reyes is an 67 y.o. male presenting to the ED with slurred speech and increased falls.  He stated on initial ED interview that he has been having difficulty with speech for at least a year, but that this has recently worsened. He stated that he had a stroke with speech deficit about 1 year ago but has not yet sought care for this. He complains of unsteady gain and feels that he has been falling more often over the past week, approximately.  His friend convinced him to be seen in the ED. He denies feeling weaker on one side than the other. Also denies sensory numbness. He has a PMHx of lupus.  EKG: sinus rhythm with PACs  SUBJECTIVE (INTERVAL HISTORY) No family is at the bedside. Patient is found laying in bed in NAD. Overall he feels his condition is unchanged. Voices no new complaints. No new events reported overnight. Was able to ambulate in hallways with PT this AM.   OBJECTIVE Lab Results: CBC:  Recent Labs  Lab 07/17/17 1925 07/18/17 0628  WBC 3.4* 3.9*  HGB 12.4* 12.8*  HCT 39.6 38.3*  MCV 82.7 82.0  PLT 175 145*   BMP: Recent Labs  Lab 07/17/17 1925 07/18/17 0628  NA 138 137  K 3.6 3.3*  CL 101 102  CO2 27 24  GLUCOSE 101* 98  BUN 16 13  CREATININE 1.30* 1.25*  CALCIUM 9.2 8.9  MG  --  1.9   Liver Function Tests:  Recent Labs  Lab 07/18/17 0628  AST 17  ALT 14*  ALKPHOS 99  BILITOT 1.1  PROT 6.7  ALBUMIN 3.3*   Cardiac Enzymes:  Recent Labs  Lab 07/18/17 0628  TROPONINI 0.07*   [place] Work -Up:  Recent Labs    07/18/17 0628  VITAMINB12 430  FOLATE 15.5  IRON 60  RETICCTPCT 0.7   Urinalysis:  Recent Labs  Lab 07/17/17 [place] CLEAR  LABSPEC 1.015  PHURINE 6.0  GLUCOSEU NEGATIVE  HGBUR NEGATIVE  BILIRUBINUR NEGATIVE  KETONESUR NEGATIVE  PROTEINUR NEGATIVE  NITRITE  NEGATIVE  LEUKOCYTESUR NEGATIVE   PHYSICAL EXAM Temp:  [97.7 F (36.5 C)-98.9 F (37.2 C)] 98.9 F (37.2 C) (02/28 1242) Pulse Rate:  [45-128] 71 (02/28 1242) Resp:  [13-20] 20 (02/28 1242) BP: (125-182)/(68-116) 125/68 (02/28 1242) SpO2:  [98 %-100 %] 100 % (02/28 1242) Weight:  [79.3 kg (174 lb 13.2 oz)-81.6 kg (180 lb)] 79.3 kg (174 lb 13.2 oz) (02/28 0631) General - Well nourished, well developed, in no apparent distress HEENT-  Normocephalic,  Cardiovascular - Regular rate and rhythm  Respiratory - Lungs clear bilaterally. No wheezing. Abdomen - soft and non-tender, BS normal Extremities- no edema or cyanosis Mental Status: Alert, oriented, thought content appropriate.  Speech fluent without evidence of aphasia.  Able to follow all commands without difficulty. Dysarthria noted Cranial Nerves: II:  Visual fields intact. PERRL  III,IV, VI: Ptosis not present, EOMI V,VII: Smile symmetric. Facial temp sensation normal bilaterally VIII: hearing normal bilaterally IX,X: Mild pharyngeal dysarthria XI: bilateral shoulder shrug XII: midline tongue extension  Motor: RUE and RLE: 5/5 LUE and LLE: 4+/5 Sensory: Temp and light touch intact proximally all 4 limbs Plantars: Right: downgoing  Left: downgoing Cerebellar: No ataxia with FNF bilaterally Gait: Deferred  IMAGING: I have personally reviewed the radiological images below and agree with the radiology interpretations. Dg Chest 2 View  Result  Date: 07/18/2017 CLINICAL DATA:  67 year old male with stroke. EXAM: CHEST  2 VIEW COMPARISON:  None. FINDINGS: The heart size and mediastinal contours are within normal limits. Both lungs are clear. The visualized skeletal structures are unremarkable. IMPRESSION: No active cardiopulmonary disease. Electronically Signed   By: Anner Crete M.D.   On: 07/18/2017 05:28   Ct Head Wo Contrast  Result Date: 07/17/2017 CLINICAL DATA:  Several recent falls.  Gait difficulty EXAM: CT HEAD  WITHOUT CONTRAST TECHNIQUE: Contiguous axial images were obtained from the base of the skull through the vertex without intravenous contrast. COMPARISON:  None. FINDINGS: Brain: The ventricles are normal in size and configuration. There is no intracranial mass hemorrhage, extra-axial fluid collection, or midline shift. There is decreased attenuation in the medial right occipital lobe, concerning for acute/recent infarct in this area. There is extensive small vessel disease in the centra semiovale bilaterally. There are multiple lacunar infarcts in basal ganglia regions bilaterally. There is decreased attenuation in the medial right lentiform nucleus compared to the left side, concerning for recent infarct in this area. There are foci of small vessel disease in each thalamus with a focal prior infarct in the mid right thalamus. Vascular: No hyperdense vessels are evident. No appreciable vascular calcification is evident. Skull: Bony calvarium appears intact. Sinuses/Orbits: There is mucosal thickening in several ethmoid air cells. Other visualized paranasal sinuses are clear. Orbits appear symmetric bilaterally. Other: Mastoid air cells are clear. There is debris in the left external auditory canal. IMPRESSION: 1. Decreased attenuation in the medial aspect of the right occipital lobe, concerning for recent and potentially acute infarct. 2. Extensive supratentorial small vessel disease. Multiple lacunar infarcts as well as small vessel disease are noted throughout both basal ganglia regions. There is decreased attenuation in the medial right lentiform nucleus compared to the left side, suggesting early/recent infarct in this area. Prior infarcts and small vessel disease in the thalami, more discrete on right than on the left. 3.  No mass or hemorrhage.  No extra-axial fluid collection. 4.  Mucosal thickening noted in several ethmoid air cells. 5.  Probable cerumen in the left external auditory canal. Electronically  Signed   By: Lowella Grip III M.D.   On: 07/17/2017 19:57   Mr Brain Wo Contrast  Result Date: 07/18/2017 CLINICAL DATA:  67 y/o  M; slurred speech and increasing falls. EXAM: MRI HEAD WITHOUT CONTRAST TECHNIQUE: Multiplanar, multiecho pulse sequences of the brain and surrounding structures were obtained without intravenous contrast. COMPARISON:  07/17/2017 CT head. FINDINGS: Brain: 7 mm focus of reduced diffusion in the right anterior putamen compatible with acute/early subacute infarction. No associated hemorrhage or mass effect. Multiple chronic lacunar infarctions are present within the bilateral basal ganglia involving thalamus, lentiform nuclei, and caudate nuclei. Multiple small chronic periventricular infarctions. Confluent patchynonspecific foci of T2 FLAIR hyperintense signal abnormality in subcortical and periventricular white matter as well as pons are compatible withseverechronic microvascular ischemic changes for age. Moderatebrain parenchymal volume loss. Numerous foci of susceptibility hypointensity are present in a predominantly central distribution involving basal ganglia, periventricular white matter, pons, and left cerebellar hemisphere compatible with hemosiderin deposition of chronic microhemorrhage. Vascular: Normal flow voids. Skull and upper cervical spine: Normal marrow signal. Sinuses/Orbits: Negative. Other: None. IMPRESSION: 1. 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 No associated hemorrhage or mass effect. 2. Several chronic lacunar infarcts in basal ganglia and periventricular white matter. 3. Severe for age chronic microvascular ischemic changes of the brain. Moderate brain parenchymal volume  loss. 4. Foci of chronic microhemorrhage with central distribution, probably related to chronic hypertension. These results were called by telephone at the time of interpretation on 07/18/2017 at 5:73 am to Dr. Roxanne Mins, who verbally acknowledged these results.  Electronically Signed   By: Kristine Garbe M.D.   On: 07/18/2017 01:46   Mr Jodene Nam Head Wo Contrast  Result Date: 07/18/2017 CLINICAL DATA:  67 y/o  M; slurred speech and infarct. EXAM: MRA HEAD WITHOUT CONTRAST TECHNIQUE: Angiographic images of the Circle of Willis were obtained using MRA technique without intravenous contrast. COMPARISON:  07/18/2017 MRI of the head. FINDINGS: Mild motion artifact. Internal carotid arteries:  Patent. Anterior cerebral arteries:  Patent. Middle cerebral arteries: Patent. Anterior communicating artery: Not identified, likely hypoplastic or absent. Posterior communicating arteries:  Patent.  Bilateral fetal PCA. Posterior cerebral arteries:  Patent. Basilar artery:  Patent. Vertebral arteries:  Patent. No evidence of high-grade stenosis, large vessel occlusion, or aneurysm. IMPRESSION: Patent anterior and posterior intracranial circulation. No large vessel occlusion, aneurysm, or significant stenosis. Electronically Signed   By: Kristine Garbe M.D.   On: 07/18/2017 06:03   Echocardiogram:                                              PENDING  B/L Carotid U/S:   1-39% ICA stenosis.     IMPRESSION: Isaac Reyes is a 67 y.o. male with PMH of  HTN and old CVA's now presenting with worsened speech relative to baseline and a 2 week history of gait instability. MRI reveals:   7 mm acute/early subacute infarction within right anterior putamen.   Suspected Etiology: small vessel disease Resultant Symptoms:  mild left hemiparesis and dysarthria Stroke Risk Factors: diabetes mellitus, hyperlipidemia and hypertension Other Stroke Risk Factors: Advanced age, Cigarette smoker,  Hx stroke, Lupus  Outstanding Stroke Work-up Studies:    Work up is  completed Lupus labs       -PENDING  PLAN  07/18/2017: Continue Aspirin/ Statin Frequent neuro checks Telemetry monitoring PT/OT/SLP Ongoing aggressive stroke risk factor management Patient counseled  to be compliant with his antithrombotic medications Patient counseled on Lifestyle modifications including, Diet, Exercise, and Stress Follow up with [facility] in 6 weeks  HX OF STROKES: Several chronic lacunar infarcts in basal ganglia and periventricular white matter  HYPERTENSION: Stable, some elevated B/P's noted overnight Permissive hypertension (OK if <220/120) for 24-48 hours post stroke and then gradually normalized within 5-7 days. SBP goal less than 140/90  Long term BP goal normotensive. May slowly start B/P medications after 48 hours Home Meds: NONE  HYPERLIPIDEMIA:    Component Value Date/Time   CHOL 200 07/18/2017 0628   TRIG 56 07/18/2017 0628   HDL 65 07/18/2017 0628   CHOLHDL 3.1 07/18/2017 0628   VLDL 11 07/18/2017 0628   LDLCALC 124 (H) 07/18/2017 0628  Home Meds:  NONE LDL  goal < 70 Started on  Lipitor to 80 mg daily Continue statin at discharge  PRE- DIABETES: Lab Results  Component Value Date   HGBA1C 5.7 (H) 07/18/2017  HgbA1c goal < 7.0 Continue CBG monitoring and SSI to maintain glucose 140-180 mg/dl DM education   TOBACCO ABUSE Current smoker Smoking cessation counseling provided Nicotine patch provided  Other Active Problems: Active Problems:   Acute CVA (cerebrovascular accident) ([place])   NSVT (nonsustained ventricular tachycardia) ([place])  Elevated blood pressure reading   Normocytic normochromic anemia    Hospital day # 0 VTE prophylaxis: Lovenox  Diet : Fall precautions Diet Heart Room service appropriate? Yes; Fluid consistency: Thin   FAMILY UPDATES: No family at bedside  TEAM UPDATES: [name], DO   Prior Home Stroke Medications:  No antithrombotic  Discharge Stroke Meds:  Please discharge patient on aspirin 325 mg daily   Disposition: Final discharge disposition not confirmed Therapy Recs:               PENDING Follow Up:  Follow-up Information    [name], MD Follow up.   Specialty:   Internal Medicine Contact information: [address] [place] [address] [phone]          [name], MD -PCP Follow up in 1-2 weeks      Assessment & plan discussed with with attending physician and they are in agreement.    Mary Sella, ANP-C Stroke Neurology Team 07/18/2017 1:16 PM   07/18/2017 ATTENDING ASSESSMENT:   I reviewed above note and agree with the assessment and plan. I have made any additions or clarifications directly to the above note. Pt was seen and examined.   67 year old male with history of lupus admitted for worsening slurred speech and increased frequency of falls at home.  MRI brain showed acute right anterior putamen small punctate infarct.  It also showed old bilateral BG, CR and right CC infarcts, as well as quite a few cerebral micro bleeds most prominent in the bilateral BG.  MRI of head unremarkable.  Carotid Doppler no significant stenosis.  EF 55-60%.  LDL 124 and A1c 5.7.  Creatinine 1.3.  His systolic blood pressure in hospital ranging from 132 - 182, indicating hypertension.  However, patient not aware of history of hypertension.  Patient foremer smoker, and currently alcohol user.   Patient stroke more consistent with mitral disease given location and risk factors.  However, due to history of lupus, will check further test to rule out antiphospholipid syndrome.  Currently on aspirin and Lipitor, continue for stroke prevention and risk factor modification.  PT/OT recommend outpatient OT.  Rosalin Hawking, MD PhD Stroke Neurology 07/18/2017 4:32 PM    To contact Stroke Continuity provider, please refer to [email or web address] After hours, contact General Neurology

## 2017-07-19 DIAGNOSIS — E440 Moderate protein-calorie malnutrition: Secondary | ICD-10-CM

## 2017-07-19 DIAGNOSIS — F1010 Alcohol abuse, uncomplicated: Secondary | ICD-10-CM | POA: Diagnosis present

## 2017-07-19 DIAGNOSIS — N289 Disorder of kidney and ureter, unspecified: Secondary | ICD-10-CM | POA: Diagnosis present

## 2017-07-19 DIAGNOSIS — F17200 Nicotine dependence, unspecified, uncomplicated: Secondary | ICD-10-CM | POA: Diagnosis present

## 2017-07-19 DIAGNOSIS — R4701 Aphasia: Secondary | ICD-10-CM | POA: Diagnosis present

## 2017-07-19 DIAGNOSIS — I639 Cerebral infarction, unspecified: Secondary | ICD-10-CM | POA: Diagnosis not present

## 2017-07-19 DIAGNOSIS — D649 Anemia, unspecified: Secondary | ICD-10-CM | POA: Diagnosis present

## 2017-07-19 DIAGNOSIS — I472 Ventricular tachycardia: Secondary | ICD-10-CM | POA: Diagnosis present

## 2017-07-19 DIAGNOSIS — R296 Repeated falls: Secondary | ICD-10-CM | POA: Diagnosis present

## 2017-07-19 DIAGNOSIS — E785 Hyperlipidemia, unspecified: Secondary | ICD-10-CM

## 2017-07-19 DIAGNOSIS — Z8249 Family history of ischemic heart disease and other diseases of the circulatory system: Secondary | ICD-10-CM | POA: Diagnosis not present

## 2017-07-19 DIAGNOSIS — Z8673 Personal history of transient ischemic attack (TIA), and cerebral infarction without residual deficits: Secondary | ICD-10-CM | POA: Diagnosis not present

## 2017-07-19 DIAGNOSIS — R7303 Prediabetes: Secondary | ICD-10-CM | POA: Diagnosis present

## 2017-07-19 DIAGNOSIS — R471 Dysarthria and anarthria: Secondary | ICD-10-CM | POA: Diagnosis present

## 2017-07-19 DIAGNOSIS — R4781 Slurred speech: Secondary | ICD-10-CM | POA: Diagnosis present

## 2017-07-19 DIAGNOSIS — G8194 Hemiplegia, unspecified affecting left nondominant side: Secondary | ICD-10-CM | POA: Diagnosis present

## 2017-07-19 DIAGNOSIS — I10 Essential (primary) hypertension: Secondary | ICD-10-CM | POA: Diagnosis present

## 2017-07-19 LAB — CBC
HCT: 37.5 % — ABNORMAL LOW (ref 39.0–52.0)
Hemoglobin: 11.7 g/dL — ABNORMAL LOW (ref 13.0–17.0)
MCH: 25.8 pg — AB (ref 26.0–34.0)
MCHC: 31.2 g/dL (ref 30.0–36.0)
MCV: 82.6 fL (ref 78.0–100.0)
Platelets: 175 10*3/uL (ref 150–400)
RBC: 4.54 MIL/uL (ref 4.22–5.81)
RDW: 15.6 % — ABNORMAL HIGH (ref 11.5–15.5)
WBC: 4.2 10*3/uL (ref 4.0–10.5)

## 2017-07-19 LAB — MAGNESIUM: MAGNESIUM: 2.4 mg/dL (ref 1.7–2.4)

## 2017-07-19 LAB — BASIC METABOLIC PANEL
Anion gap: 9 (ref 5–15)
BUN: 24 mg/dL — AB (ref 6–20)
CHLORIDE: 101 mmol/L (ref 101–111)
CO2: 28 mmol/L (ref 22–32)
CREATININE: 2.16 mg/dL — AB (ref 0.61–1.24)
Calcium: 8.8 mg/dL — ABNORMAL LOW (ref 8.9–10.3)
GFR calc Af Amer: 35 mL/min — ABNORMAL LOW (ref >60)
GFR calc non Af Amer: 30 mL/min — ABNORMAL LOW (ref >60)
GLUCOSE: 92 mg/dL (ref 65–99)
POTASSIUM: 3.3 mmol/L — AB (ref 3.5–5.1)
SODIUM: 138 mmol/L (ref 135–145)

## 2017-07-19 LAB — CARDIOLIPIN ANTIBODIES, IGG, IGM, IGA
Anticardiolipin IgA: <9 APL U/mL (ref 0–11)
Anticardiolipin IgM: <9 MPL U/mL (ref 0–12)

## 2017-07-19 LAB — HOMOCYSTEINE: HOMOCYSTEINE-NORM: 17.9 umol/L — AB (ref 0.0–15.0)

## 2017-07-19 LAB — LUPUS ANTICOAGULANT PANEL
DRVVT: 35.3 s (ref 0.0–47.0)
PTT Lupus Anticoagulant: 36 s (ref 0.0–51.9)

## 2017-07-19 LAB — ANTI-DNA ANTIBODY, DOUBLE-STRANDED

## 2017-07-19 MED ORDER — ATORVASTATIN CALCIUM 80 MG PO TABS: 80.0000 mg | ORAL_TABLET | Freq: Every day | ORAL | 0 refills | Status: DC

## 2017-07-19 MED ORDER — ASPIRIN 325 MG PO TABS: 325.0000 mg | ORAL_TABLET | Freq: Every day | ORAL | 0 refills | Status: DC

## 2017-07-19 NOTE — Progress Notes (Signed)
Patient discharged home. IV and telemetry discontinued. Discharge information given. Patient questions asked and answered. Volunteer services to transport patient from unit via wheelchair. Wendee Copp

## 2017-07-19 NOTE — Progress Notes (Addendum)
Occupational Therapy Treatment Patient Details Name: Isaac Reyes MRN: [id number] DOB: [birth year] Today's Date: 07/19/2017    History of present illness 67 y.o. male presenting to the ED with slurred speech and increased falls. 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  PMH: Lupus   OT comments  Pt anxious about leaving - pt asking about leaving - nurse had discussed this with pt prior to session. Pt again asking about when he would leave - discussion with pt about need to have DC order from MD once they feel he is appropirate for DC.  Educated pt on HEP for fine motor and coordination activities and pre-writing tasks. During session, pt demonstrates apparent cognitive impairment, primarily with attention as noted below. Recommend further cognitive assessment. Given pt's current attentional deficits, recommend pt refrain from driving at this time. Continue to recommend follow up with outpt OT at the neuro outpt center. Pt in agreement.   Follow Up Recommendations  Outpatient OT ; intermittent S Refrain from driving   Equipment Recommendations  None recommended by OT    Recommendations for Other Services      Precautions / Restrictions Precautions Precautions: None Restrictions Weight Bearing Restrictions: No       Mobility Bed Mobility                  Transfers                      Balance                                           ADL either performed or assessed with clinical judgement   ADL                                               Vision   Additional Comments: concerns for decreased visual attention; fields appear intact. will further assess   Perception     Praxis      Cognition Arousal/Alertness: Awake/alert Behavior During Therapy: WFL for tasks assessed/performed Overall Cognitive Status: Impaired/Different from baseline Area of Impairment: Attention                    Current Attention Level: Selective           General Comments: Pt with apparent deficits with attention - will further assess. During cancellation task, pt omitted a significant number of targets        Exercises Exercises: Other exercises Other Exercises Other Exercises: fine motor/coordination Other Exercises: BUE integrated activities Other Exercises: handwriting activities   Shoulder Instructions       General Comments      Pertinent Vitals/ Pain       Pain Assessment: No/denies pain  Home Living       Type of Home: House                                  Prior Functioning/Environment              Frequency  Min 3X/week        Progress Toward Goals  OT Goals(current goals can now be found in the care  plan section)  Progress towards OT goals: Progressing toward goals  Acute Rehab OT Goals Patient Stated Goal: to go home OT Goal Formulation: With patient Time For Goal Achievement: 08/01/17 Potential to Achieve Goals: Good ADL Goals Pt/caregiver will Perform Home Exercise Program: Both right and left upper extremity;With theraputty;With written HEP provided Additional ADL Goal #1: Pt will independently verbalize 3 strtegies to reduce risk of falls.  Plan Discharge plan remains appropriate    Co-evaluation                 AM-PAC PT "6 Clicks" Daily Activity     Outcome Measure   Help from another person eating meals?: None Help from another person taking care of personal grooming?: None Help from another person toileting, which includes using toliet, bedpan, or urinal?: None Help from another person bathing (including washing, rinsing, drying)?: None Help from another person to put on and taking off regular upper body clothing?: A Little Help from another person to put on and taking off regular lower body clothing?: None 6 Click Score: 23    End of Session    OT Visit Diagnosis: Cognitive communication deficit  (R41.841);Muscle weakness (generalized) (M62.81) Symptoms and signs involving cognitive functions: Cerebral infarction   Activity Tolerance Patient tolerated treatment well   Patient Left in bed;with call bell/phone within reach;Other (comment)(working with ST)   Nurse Communication Other (comment)(DC needs)        Time: 5189-8421 OT Time Calculation (min): 21 min  Charges: OT General Charges $OT Visit: 1 Visit OT Treatments $Therapeutic Activity: 8-22 mins  [facility], OT/L  [phone] 07/19/2017   Dayvon Dax,HILLARY 07/19/2017, 9:18 AM

## 2017-07-19 NOTE — Evaluation (Signed)
Speech Language Pathology Evaluation Patient Details Name: Isaac Reyes MRN: [id number] DOB: 1950-10-13 Today's Date: 07/19/2017 Time: 7829-5621 SLP Time Calculation (min) (ACUTE ONLY): 30 min  Problem List:  Patient Active Problem List   Diagnosis Date Noted  . CVA (cerebral vascular accident) ([place]) 07/19/2017  . Stroke (cerebrum) ([place]) 07/18/2017  . NSVT (nonsustained ventricular tachycardia) ([place]) 07/18/2017  . Elevated blood pressure reading 07/18/2017  . Normocytic normochromic anemia 07/18/2017  . Malnutrition of moderate degree 07/18/2017  . Normocytic anemia   . Frequent falls   . Renal insufficiency   . Hyperlipidemia   . Heavy smoker   . Alcohol abuse   . Abnormal EKG 03/19/2013  . Pre-operative cardiovascular exam, new EKG abnormalities c/w ischemia 03/19/2013  . LVH (left ventricular hypertrophy) 03/19/2013  . Murmur, cardiac 03/19/2013   Past Medical History:  Past Medical History:  Diagnosis Date  . Acute CVA (cerebrovascular accident) ([place]) 06/2017  . Anemia   . Lupus   . Lupus   . NSVT (nonsustained ventricular tachycardia) ([facility])    Past Surgical History:  Past Surgical History:  Procedure Laterality Date  . KNEE SURGERY     HPI:  67 year old male with history of lupus admitted for worsening slurred speech and increased frequency of falls at home.  MRI brain showed acute right anterior putamen small punctate infarct.  It also showed old bilateral BG, CR and right CC infarcts, as well as quite a few cerebral micro bleeds most prominent in the bilateral BG.     Assessment / Plan / Recommendation Clinical Impression  Patient presents with moderate impairments in the areas of speech intelligibility, sustained attention and organization, with impact on working memory and high level reasoning tasks. These deficits have the potential to impact safety with ADLs post discharge although patient eager to return home. Recommend OP SLP f/u for intermittent  supervision at a minimum after discharge.     SLP Assessment  SLP Recommendation/Assessment: Patient needs continued Speech Lanaguage Pathology Services SLP Visit Diagnosis: Attention and concentration deficit Attention and concentration deficit following: Cerebral infarction    Follow Up Recommendations  Outpatient SLP    Frequency and Duration min 2x/week  1 week      SLP Evaluation Cognition  Overall Cognitive Status: Impaired/Different from baseline Arousal/Alertness: Awake/alert Orientation Level: Oriented X4 Attention: Sustained Sustained Attention: Impaired Sustained Attention Impairment: Verbal complex;Functional complex Memory: Impaired Memory Impairment: Storage deficit;Retrieval deficit Awareness: Impaired Awareness Impairment: Anticipatory impairment Problem Solving: Appears intact Executive Function: Reasoning;Organizing;Self Correcting Reasoning: Impaired Reasoning Impairment: Verbal complex;Functional complex Organizing: Impaired Organizing Impairment: Verbal complex;Functional complex Self Correcting: Impaired Self Correcting Impairment: Verbal complex;Functional complex Behaviors: Impulsive Safety/Judgment: Impaired(? baseline)       Comprehension  Auditory Comprehension Overall Auditory Comprehension: Appears within functional limits for tasks assessed Visual Recognition/Discrimination Discrimination: Within Function Limits Reading Comprehension Reading Status: Within funtional limits    Expression Expression Primary Mode of Expression: Verbal Verbal Expression Overall Verbal Expression: Appears within functional limits for tasks assessed   Oral / Motor  Oral Motor/Sensory Function Overall Oral Motor/Sensory Function: Generalized oral weakness(discoordination) Motor Speech Overall Motor Speech: Impaired Respiration: Within functional limits Phonation: Normal Resonance: Within functional limits Articulation: Impaired Level of Impairment:  Word Intelligibility: Intelligibility reduced Word: 50-74% accurate Phrase: 50-74% accurate Sentence: 50-74% accurate Conversation: 50-74% accurate Motor Planning: Not tested Effective Techniques: Increased vocal intensity   GO            Isaac Rainwater MA, CCC-SLP [phone]  Isaac Reyes Isaac Reyes 07/19/2017, 11:07 AM

## 2017-07-19 NOTE — Care Management Note (Signed)
Case Management Note  Patient Details  Name: Isaac Reyes MRN: [id number] Date of Birth: 05-27-1950  Subjective/Objective:     Pt admitted with CVA. He is from home where he takes care of his mother.                Action/Plan: Pt discharging home with self care. CM consulted for outpatient therapy. CM met with the patient and he would like to attend [facility]. Orders in Epic and information on the AVS. Pt states he has transportation home.   Expected Discharge Date:                  Expected Discharge Plan:  OP Rehab  In-House Referral:     Discharge planning Services  CM Consult  Post Acute Care Choice:    Choice offered to:     DME Arranged:    DME Agency:     HH Arranged:    [place] Agency:     Status of Service:  Completed, signed off  If discussed at [employer] of Stay Meetings, dates discussed:    Additional Comments:  Pollie Friar, RN 07/19/2017, 11:51 AM

## 2017-07-19 NOTE — Discharge Summary (Signed)
Discharge Summary  Isaac Reyes [facility] IOX:[id number] DOB: Feb 04, 1951  PCP: [name], MD  Admit date: 07/17/2017 Discharge date: 07/19/2017  Time spent: 25 minutes  Recommendations for Outpatient Follow-up:  1. Follow up with neurology 2. Follow up with PCP 3. Continue OT in the outpatient setting 4. Continue speech therapy in the outpatient setting 5. Fall precaution  Discharge Diagnoses:  Active Hospital Problems   Diagnosis Date Noted  . CVA (cerebral vascular accident) ([place]) 07/19/2017  . 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 NSVT (nonsustained ventricular tachycardia) ([place]) 07/18/2017  . Elevated blood pressure reading 07/18/2017  . Normocytic normochromic anemia 07/18/2017  . Malnutrition of moderate degree 07/18/2017  . Frequent falls   . Renal insufficiency   . Hyperlipidemia   . Heavy smoker   . Alcohol abuse     Resolved Hospital Problems  No resolved problems to display.    Discharge Condition: stable   Diet recommendation: resume previous diet  Vitals:   07/19/17 0400 07/19/17 0930  BP: (!) 146/77 99/85  Pulse: 60 76  Resp: 18 18  Temp: 98.6 F (37 C) 98 F (36.7 C)  SpO2: 97% 100%    History of present illness:  Isaac Reyes a 67 y.o.malewithno significant past medical history presents to the ER with complaints of slurred speech. Patient states he has been having difficulty speaking for last 1 year but his friends noticed that his speech has got worse acutely. His friends insisted him to come to the ER and presented. Denies any weakness of the upper or lower extremities denies any visual symptoms. Denies any difficulty swallowing. Has not been taking any medications. Has not been to his PCP for many years. 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  07/18/17: patient seen and examined at his bedside. Appears to have some weakness in his left lower extremity. Mild dysarthria.  Neurology following, highly appreciated.  07/19/17: no new complaints. On the day of discharge the patient was hemodynamically stable. He will need to continue OT/speech therapy in the outpatient setting and follow up with neurology and PCP.   MRI brain showed acute right anterior putamen small punctate infarct.  It also showed old bilateral BG, CR and right CC infarcts, as well as quite a few cerebral micro bleeds most prominent in the bilateral BG.  MRI of head unremarkable.  Carotid Doppler no significant stenosis.  EF 55-60%.  LDL 124 and A1c 5.7.     Hospital Course:  Active Problems:   Stroke (cerebrum) ([facility])   NSVT (nonsustained ventricular tachycardia) ([facility])   Elevated blood pressure reading   Normocytic normochromic anemia   Malnutrition of moderate degree   Frequent falls   Renal insufficiency   Hyperlipidemia   Heavy smoker   Alcohol abuse   CVA (cerebral vascular accident) ([place])  1-Acute/subacute CVA with slurred speech -appreciate neurology consult.  MRI/MRA of the brain revealed 7 mm acute/early subacute infarction within right anterior putamen.   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 If you have any questions about your discharge medications or the care you received while you were in the  hospital after you are discharged, you can call the unit and asked to speak with the hospitalist on call if the hospitalist that took care of you is not available. Once you are discharged, your primary care physician will handle any further medical issues. Please note that NO REFILLS for any discharge medications will be authorized once you are discharged, as it is imperative that you return to your primary care physician (or establish a relationship with a primary care physician if you do not have one) for your aftercare needs so that they can reassess your need for medications and monitor your lab values.  Discharge Instructions    Ambulatory referral to Neurology   Complete by:  As directed    Stroke, frequent falls   Ambulatory referral to Occupational Therapy   Complete by:  As directed    Ambulatory referral to Speech Therapy   Complete by:  As directed      Allergies as of 07/19/2017   No Known Allergies     Medication List    TAKE these medications   aspirin 325 MG tablet Take 1 tablet (325 mg total) by mouth daily. Start taking on:  07/20/2017   atorvastatin 80 MG tablet Commonly known as:  LIPITOR Take 1 tablet (80 mg total) by mouth daily at 6 PM.   multivitamin with minerals Tabs tablet Take 1 tablet by mouth daily.      No Known Allergies Follow-up Information    [name], MD Follow up in 3 day(s).   Specialty:  Internal Medicine Contact information: [address], [address] [place] [address] [place] Follow up in 3 day(s).        [facility] Follow up in 1 week(s).   Specialty:  Speech Therapy       [facility] Follow up.   Specialty:  Rehabilitation Why:  They will contact you for the first visit. Contact information: [address] [place] [id number] [place] [phone]            The results of significant diagnostics from this hospitalization (including imaging, microbiology, ancillary and laboratory) are listed below for reference.    Significant Diagnostic Studies: Dg Chest 2 View  Result Date: 07/18/2017 CLINICAL DATA:  67 year old male with stroke. EXAM: CHEST  2 VIEW COMPARISON:  None. FINDINGS: The heart size and mediastinal contours are within normal limits. Both lungs are clear. The visualized skeletal structures are unremarkable. IMPRESSION: No active cardiopulmonary disease. Electronically Signed   By: Anner Crete M.D.   On: 07/18/2017 05:28   Ct Head Wo Contrast  Result Date: 07/17/2017 CLINICAL DATA:  Several recent falls.  Gait difficulty EXAM: CT HEAD WITHOUT CONTRAST TECHNIQUE: Contiguous axial images were obtained from the base of the skull through the vertex without intravenous contrast. COMPARISON:  None. FINDINGS: Brain: The ventricles are normal in size and configuration. There is no intracranial mass hemorrhage, extra-axial fluid collection, or midline shift. There is decreased attenuation in the medial  right occipital lobe, concerning for acute/recent infarct in this area. There is extensive small vessel disease in the centra semiovale bilaterally. There are multiple lacunar infarcts in basal ganglia regions bilaterally. There is decreased attenuation in the medial right lentiform nucleus compared to the left side, concerning for recent infarct in this area. There are foci of small vessel disease in each thalamus with a focal prior infarct in the mid right thalamus. Vascular: No hyperdense vessels are evident. No appreciable vascular calcification is evident. Skull: Bony calvarium appears intact. Sinuses/Orbits: There is mucosal thickening in several ethmoid air cells. Other visualized paranasal sinuses are clear. Orbits appear symmetric bilaterally. Other: Mastoid air cells are clear. There is debris in the left external auditory canal.  IMPRESSION: 1. Decreased attenuation in the medial aspect of the right occipital lobe, concerning for recent and potentially acute infarct. 2. Extensive supratentorial small vessel disease. Multiple lacunar infarcts as well as small vessel disease are noted throughout both basal ganglia regions. There is decreased attenuation in the medial right lentiform nucleus compared to the left side, suggesting early/recent infarct in this area. Prior infarcts and small vessel disease in the thalami, more discrete on right than on the left. 3.  No mass or hemorrhage.  No extra-axial fluid collection. 4.  Mucosal thickening noted in several ethmoid air cells. 5.  Probable cerumen in the left external auditory canal. Electronically Signed   By: Lowella Grip III M.D.   On: 07/17/2017 19:57   Mr Brain Wo Contrast  Result Date: 07/18/2017 CLINICAL DATA:  67 y/o  M; slurred speech and increasing falls. EXAM: MRI HEAD WITHOUT CONTRAST TECHNIQUE: Multiplanar, multiecho pulse sequences of the brain and surrounding structures were obtained without intravenous contrast. COMPARISON:  07/17/2017 CT head. FINDINGS: Brain: 7 mm focus of reduced diffusion in the right anterior putamen compatible with acute/early subacute infarction. No associated hemorrhage or mass effect. Multiple chronic lacunar infarctions are present within the bilateral basal ganglia involving thalamus, lentiform nuclei, and caudate nuclei. Multiple small chronic periventricular infarctions. Confluent patchynonspecific foci of T2 FLAIR hyperintense signal abnormality in subcortical and periventricular white matter as well as pons are compatible withseverechronic microvascular ischemic changes for age. Moderatebrain parenchymal volume loss. Numerous foci of susceptibility hypointensity are present in a predominantly central distribution involving basal ganglia, periventricular white matter, pons, and left cerebellar hemisphere compatible with hemosiderin  deposition of chronic microhemorrhage. Vascular: Normal flow voids. Skull and upper cervical spine: Normal marrow signal. Sinuses/Orbits: Negative. Other: None. IMPRESSION: 1. 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 No associated hemorrhage or mass effect. 2. Several chronic lacunar infarcts in basal ganglia and periventricular white matter. 3. Severe for age chronic microvascular ischemic changes of the brain. Moderate brain parenchymal volume loss. 4. Foci of chronic microhemorrhage with central distribution, probably related to chronic hypertension. These results were called by telephone at the time of interpretation on 07/18/2017 at 8:29 am to Dr. Roxanne Mins, who verbally acknowledged these results. Electronically Signed   By: Kristine Garbe M.D.   On: 07/18/2017 01:46   Mr Jodene Nam Head Wo Contrast  Result Date: 07/18/2017 CLINICAL DATA:  67 y/o  M; slurred speech and infarct. EXAM: MRA HEAD WITHOUT CONTRAST TECHNIQUE: Angiographic images of the Circle of Willis were obtained using MRA technique without intravenous contrast. COMPARISON:  07/18/2017 MRI of the head. FINDINGS: Mild motion artifact. Internal carotid arteries:  Patent. Anterior cerebral arteries:  Patent. Middle cerebral arteries: Patent. Anterior communicating artery: Not identified, likely hypoplastic or absent. Posterior communicating  arteries:  Patent.  Bilateral fetal PCA. Posterior cerebral arteries:  Patent. Basilar artery:  Patent. Vertebral arteries:  Patent. No evidence of high-grade stenosis, large vessel occlusion, or aneurysm. IMPRESSION: Patent anterior and posterior intracranial circulation. No large vessel occlusion, aneurysm, or significant stenosis. Electronically Signed   By: Kristine Garbe M.D.   On: 07/18/2017 06:03    Microbiology: No results found for this or any previous visit (from the past 240 hour(s)).   Labs: Basic Metabolic Panel: Recent Labs  Lab 07/17/17 1925  07/18/17 0628 07/19/17 1003  NA 138 137 138  K 3.6 3.3* 3.3*  CL 101 102 101  CO2 27 24 28   GLUCOSE 101* 98 92  BUN 16 13 24*  CREATININE 1.30* 1.25* 2.16*  CALCIUM 9.2 8.9 8.8*  MG  --  1.9 2.4   Liver Function Tests: Recent Labs  Lab 07/18/17 0628  AST 17  ALT 14*  ALKPHOS 99  BILITOT 1.1  PROT 6.7  ALBUMIN 3.3*   No results for input(s): LIPASE, AMYLASE in the last 168 hours. No results for input(s): AMMONIA in the last 168 hours. CBC: Recent Labs  Lab 07/17/17 1925 07/18/17 0628 07/19/17 1003  WBC 3.4* 3.9* 4.2  NEUTROABS 1.6*  --   --   HGB 12.4* 12.8* 11.7*  HCT 39.6 38.3* 37.5*  MCV 82.7 82.0 82.6  PLT 175 145* 175   Cardiac Enzymes: Recent Labs  Lab 07/18/17 0628  TROPONINI 0.07*   BNP: BNP (last 3 results) No results for input(s): BNP in the last 8760 hours.  ProBNP (last 3 results) No results for input(s): PROBNP in the last 8760 hours.  CBG: No results for input(s): GLUCAP in the last 168 hours.     Signed:  [name], MD [facility] 07/19/2017, 6:02 PM

## 2017-07-19 NOTE — Progress Notes (Addendum)
NEUROHOSPITALISTS STROKE TEAM - DAILY PROGRESS NOTE   ADMISSION HISTORY:  Isaac Reyes is an 67 y.o. male presenting to the ED with slurred speech and increased falls.  He stated on initial ED interview that he has been having difficulty with speech for at least a year, but that this has recently worsened. He stated that he had a stroke with speech deficit about 1 year ago but has not yet sought care for this. He complains of unsteady gain and feels that he has been falling more often over the past week, approximately.  His friend convinced him to be seen in the ED. He denies feeling weaker on one side than the other. Also denies sensory numbness. He has a PMHx of lupus.  EKG: sinus rhythm with PACs  SUBJECTIVE (INTERVAL HISTORY) No family is at the bedside. Patient is found laying in bed in NAD. Overall he feels his condition is slowly improving. Voices no new complaints. No new events reported overnight. Dysarthria slowly improving. Patient anxious to be discharged home today   OBJECTIVE Lab Results: CBC:  Recent Labs  Lab 07/17/17 1925 07/18/17 0628 07/19/17 1003  WBC 3.4* 3.9* 4.2  HGB 12.4* 12.8* 11.7*  HCT 39.6 38.3* 37.5*  MCV 82.7 82.0 82.6  PLT 175 145* 175   BMP: Recent Labs  Lab 07/17/17 1925 07/18/17 0628 07/19/17 1003  NA 138 137 138  K 3.6 3.3* 3.3*  CL 101 102 101  CO2 27 24 28   GLUCOSE 101* 98 92  BUN 16 13 24*  CREATININE 1.30* 1.25* 2.16*  CALCIUM 9.2 8.9 8.8*  MG  --  1.9 2.4   Liver Function Tests:  Recent Labs  Lab 07/18/17 0628  AST 17  ALT 14*  ALKPHOS 99  BILITOT 1.1  PROT 6.7  ALBUMIN 3.3*   Cardiac Enzymes:  Recent Labs  Lab 07/18/17 0628  TROPONINI 0.07*   [place] Work -Up:  Recent Labs    07/18/17 0628  VITAMINB12 430  FOLATE 15.5  IRON 60  RETICCTPCT 0.7   Urinalysis:  Recent Labs  Lab 07/17/17 1909  COLORURINE YELLOW  APPEARANCEUR CLEAR  LABSPEC 1.015    PHURINE 6.0  GLUCOSEU NEGATIVE  HGBUR NEGATIVE  BILIRUBINUR NEGATIVE  KETONESUR NEGATIVE  PROTEINUR NEGATIVE  NITRITE NEGATIVE  LEUKOCYTESUR NEGATIVE   PHYSICAL EXAM Temp:  [98 F (36.7 C)-99.3 F (37.4 C)] 98 F (36.7 C) (03/01 0930) Pulse Rate:  [60-81] 76 (03/01 0930) Resp:  [18-20] 18 (03/01 0930) BP: (99-146)/(71-85) 99/85 (03/01 0930) SpO2:  [97 %-100 %] 100 % (03/01 0930) General - Well nourished, well developed, in no apparent distress HEENT-  Normocephalic,  Cardiovascular - Regular rate and rhythm  Respiratory - Lungs clear bilaterally. No wheezing. Abdomen - soft and non-tender, BS normal Extremities- no edema or cyanosis Mental Status: Alert, oriented, thought content appropriate.  Speech fluent without evidence of aphasia.  Able to follow all commands without difficulty. Dysarthria noted but improved from yesterdays exam Cranial Nerves: II:  Visual fields intact. PERRL  III,IV, VI: Ptosis not present, EOMI V,VII: Smile symmetric. Facial temp sensation normal bilaterally VIII: hearing normal bilaterally IX,X: Mild pharyngeal dysarthria XI: bilateral shoulder shrug XII: midline tongue extension  Motor: RUE and RLE: 5/5 LUE and LLE: 4+/5 Sensory: Temp and light touch intact proximally all 4 limbs Plantars: Right: downgoing  Left: downgoing Cerebellar: No ataxia with FNF bilaterally Gait: Deferred  IMAGING: I have personally reviewed the radiological images below and agree with the radiology interpretations.  Dg Chest 2 View  Result Date: 07/18/2017 CLINICAL DATA:  67 year old male with stroke. EXAM: CHEST  2 VIEW COMPARISON:  None. FINDINGS: The heart size and mediastinal contours are within normal limits. Both lungs are clear. The visualized skeletal structures are unremarkable. IMPRESSION: No active cardiopulmonary disease. Electronically Signed   By: Anner Crete M.D.   On: 07/18/2017 05:28   Ct Head Wo Contrast  Result Date: 07/17/2017 CLINICAL  DATA:  Several recent falls.  Gait difficulty EXAM: CT HEAD WITHOUT CONTRAST TECHNIQUE: Contiguous axial images were obtained from the base of the skull through the vertex without intravenous contrast. COMPARISON:  None. FINDINGS: Brain: The ventricles are normal in size and configuration. There is no intracranial mass hemorrhage, extra-axial fluid collection, or midline shift. There is decreased attenuation in the medial right occipital lobe, concerning for acute/recent infarct in this area. There is extensive small vessel disease in the centra semiovale bilaterally. There are multiple lacunar infarcts in basal ganglia regions bilaterally. There is decreased attenuation in the medial right lentiform nucleus compared to the left side, concerning for recent infarct in this area. There are foci of small vessel disease in each thalamus with a focal prior infarct in the mid right thalamus. Vascular: No hyperdense vessels are evident. No appreciable vascular calcification is evident. Skull: Bony calvarium appears intact. Sinuses/Orbits: There is mucosal thickening in several ethmoid air cells. Other visualized paranasal sinuses are clear. Orbits appear symmetric bilaterally. Other: Mastoid air cells are clear. There is debris in the left external auditory canal. IMPRESSION: 1. Decreased attenuation in the medial aspect of the right occipital lobe, concerning for recent and potentially acute infarct. 2. Extensive supratentorial small vessel disease. Multiple lacunar infarcts as well as small vessel disease are noted throughout both basal ganglia regions. There is decreased attenuation in the medial right lentiform nucleus compared to the left side, suggesting early/recent infarct in this area. Prior infarcts and small vessel disease in the thalami, more discrete on right than on the left. 3.  No mass or hemorrhage.  No extra-axial fluid collection. 4.  Mucosal thickening noted in several ethmoid air cells. 5.  Probable  cerumen in the left external auditory canal. Electronically Signed   By: Lowella Grip III M.D.   On: 07/17/2017 19:57   Mr Brain Wo Contrast  Result Date: 07/18/2017 CLINICAL DATA:  67 y/o  M; slurred speech and increasing falls. EXAM: MRI HEAD WITHOUT CONTRAST TECHNIQUE: Multiplanar, multiecho pulse sequences of the brain and surrounding structures were obtained without intravenous contrast. COMPARISON:  07/17/2017 CT head. FINDINGS: Brain: 7 mm focus of reduced diffusion in the right anterior putamen compatible with acute/early subacute infarction. No associated hemorrhage or mass effect. Multiple chronic lacunar infarctions are present within the bilateral basal ganglia involving thalamus, lentiform nuclei, and caudate nuclei. Multiple small chronic periventricular infarctions. Confluent patchynonspecific foci of T2 FLAIR hyperintense signal abnormality in subcortical and periventricular white matter as well as pons are compatible withseverechronic microvascular ischemic changes for age. Moderatebrain parenchymal volume loss. Numerous foci of susceptibility hypointensity are present in a predominantly central distribution involving basal ganglia, periventricular white matter, pons, and left cerebellar hemisphere compatible with hemosiderin deposition of chronic microhemorrhage. Vascular: Normal flow voids. Skull and upper cervical spine: Normal marrow signal. Sinuses/Orbits: Negative. Other: None. IMPRESSION: 1. 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 No associated hemorrhage or mass effect. 2. Several chronic lacunar infarcts in basal ganglia and periventricular white matter. 3. Severe for age chronic microvascular ischemic changes of  the brain. Moderate brain parenchymal volume loss. 4. Foci of chronic microhemorrhage with central distribution, probably related to chronic hypertension. These results were called by telephone at the time of interpretation on 07/18/2017 at 7:74  am to Dr. Roxanne Mins, who verbally acknowledged these results. Electronically Signed   By: Kristine Garbe M.D.   On: 07/18/2017 01:46   Mr Jodene Nam Head Wo Contrast  Result Date: 07/18/2017 CLINICAL DATA:  67 y/o  M; slurred speech and infarct. EXAM: MRA HEAD WITHOUT CONTRAST TECHNIQUE: Angiographic images of the Circle of Willis were obtained using MRA technique without intravenous contrast. COMPARISON:  07/18/2017 MRI of the head. FINDINGS: Mild motion artifact. Internal carotid arteries:  Patent. Anterior cerebral arteries:  Patent. Middle cerebral arteries: Patent. Anterior communicating artery: Not identified, likely hypoplastic or absent. Posterior communicating arteries:  Patent.  Bilateral fetal PCA. Posterior cerebral arteries:  Patent. Basilar artery:  Patent. Vertebral arteries:  Patent. No evidence of high-grade stenosis, large vessel occlusion, or aneurysm. IMPRESSION: Patent anterior and posterior intracranial circulation. No large vessel occlusion, aneurysm, or significant stenosis. Electronically Signed   By: Kristine Garbe M.D.   On: 07/18/2017 06:03   Echocardiogram:                                              Study Conclusions  - Left ventricle: The cavity size was normal. There was mild   concentric hypertrophy. Systolic function was normal. The   estimated ejection fraction was in the range of 55% to 60%. Wall   motion was normal; there were no regional wall motion   abnormalities. Doppler parameters are consistent with abnormal   left ventricular relaxation (grade 1 diastolic dysfunction). - Aortic valve: There was mild regurgitation. - Left atrium: The atrium was mildly dilated. - Right ventricle: The cavity size was normal. Wall thickness was   increased. - Right atrium: The atrium was mildly dilated.  Impressions: - The left ventriuclar myocardium is hyperechogenic and right   ventricular wall thicknes is also increased. Consider an   infiltrative  process, such as amyloidosis.  B/L Carotid U/S:   1-39% ICA stenosis.     IMPRESSION: Mr. KAVEH KISSINGER is a 67 y.o. male with PMH of  HTN and old CVA's now presenting with worsened speech relative to baseline and a 2 week history of gait instability. MRI reveals:   7 mm acute/early subacute infarction within right anterior putamen.   Suspected Etiology: small vessel disease Resultant Symptoms:  mild left hemiparesis and dysarthria Stroke Risk Factors: diabetes mellitus, hyperlipidemia and hypertension Other Stroke Risk Factors: Advanced age, Cigarette smoker,  Hx stroke, Lupus  Outstanding Stroke Work-up Studies:    Work up is  completed Lupus labs       -PENDING, will follow up at Outpatient Neurology appointment  PLAN  07/19/2017: Continue Aspirin/ Statin Likely to be discharged home later today Frequent neuro checks Telemetry monitoring PT/OT/SLP Ongoing aggressive stroke risk factor management Patient counseled to be compliant with his antithrombotic medications Patient counseled on Lifestyle modifications including, Diet, Exercise, and Stress Follow up with [facility] in 6 weeks  HX OF STROKES: Several chronic lacunar infarcts in basal ganglia and periventricular white matter  HYPERTENSION: Stable, some elevated B/P's noted overnight Permissive hypertension (OK if <220/120) for 24-48 hours post stroke and then gradually normalized within 5-7 days. SBP goal less than  140/90  Long term BP goal normotensive. May slowly start B/P medications after 48 hours Home Meds: NONE  HYPERLIPIDEMIA:    Component Value Date/Time   CHOL 200 07/18/2017 0628   TRIG 56 07/18/2017 0628   HDL 65 07/18/2017 0628   CHOLHDL 3.1 07/18/2017 0628   VLDL 11 07/18/2017 0628   LDLCALC 124 (H) 07/18/2017 0628  Home Meds:  NONE LDL  goal < 70 Started on  Lipitor to 80 mg daily Continue statin at discharge  PRE- DIABETES: Lab Results  Component Value Date   HGBA1C 5.7  (H) 07/18/2017  HgbA1c goal < 7.0 Continue CBG monitoring and SSI to maintain glucose 140-180 mg/dl DM education   TOBACCO ABUSE Current smoker Smoking cessation counseling provided Nicotine patch provided  Other Active Problems: Active Problems:   Stroke (cerebrum) (HCC)   NSVT (nonsustained ventricular tachycardia) (HCC)   Elevated blood pressure reading   Normocytic normochromic anemia   Malnutrition of moderate degree   Frequent falls   Renal insufficiency   Hyperlipidemia   Heavy smoker   Alcohol abuse   CVA (cerebral vascular accident) [place])    Hospital day # 0 VTE prophylaxis: Lovenox  Diet : Fall precautions Diet Heart Room service appropriate? Yes; Fluid consistency: Thin   FAMILY UPDATES: No family at bedside  TEAM UPDATES: [name], DO   Prior Home Stroke Medications:  No antithrombotic  Discharge Stroke Meds:  Please discharge patient on aspirin 325 mg daily   Disposition: Final discharge disposition not confirmed Therapy Recs:               HOME Follow Up:  Follow-up Information    [name], MD Follow up in 3 day(s).   Specialty:  Internal Medicine Contact information: [address], [address] [place] [address] [place] Follow up in 3 day(s).        Garvin Fila, MD Follow up in 1 week(s).   Specialties:  Neurology, Radiology Contact information: [address] [place] [address] [phone]        [place] Follow up in 1 week(s).   Specialty:  Speech Therapy       [place] Follow up.   Specialty:  Rehabilitation Why:  They will contact you for the first visit. Contact information: [address] [place] [id number] [place]         [name], MD -PCP Follow up in 1-2 weeks      Assessment & plan discussed with with  attending physician and they are in agreement.    Renie Ora Stroke Neurology Team 07/19/2017 2:11 PM   ATTENDING ASSESSMENT:   I reviewed above note and agree with the assessment and plan. I have made any additions or clarifications directly to the above note. Pt was seen and examined.   67 year old male with history of lupus admitted for worsening slurred speech and increased frequency of falls at home.  MRI brain showed acute right anterior putamen small punctate infarct.  It also showed old bilateral BG, CR and right CC infarcts, as well as quite a few cerebral micro bleeds most prominent in the bilateral BG.  MRI of head unremarkable.  Carotid Doppler no significant stenosis.  EF 55-60%.  LDL 124 and A1c 5.7.  Creatinine 1.3.  His systolic blood pressure in hospital ranging from 132 - 182, indicating  hypertension.  However, patient not aware of history of hypertension.  Patient foremer smoker, and currently alcohol user.   Patient stroke more consistent with mitral disease given location and risk factors. Pending labs to rule out antiphospholipid syndrome.  Currently on aspirin and Lipitor, continue for stroke prevention and risk factor modification.  PT/OT recommend outpatient OT.  Neurology will sign off. Please call with questions. Pt has been referred to [facility] for urgent follow up prior to admission. Thanks for the consult.   Rosalin Hawking, MD PhD Stroke Neurology 07/19/2017 2:11 PM   To contact Stroke Continuity provider, please refer to [email or web address] After hours, contact General Neurology

## 2017-07-20 LAB — BETA-2-GLYCOPROTEIN I ABS, IGG/M/A: Beta-2-Glycoprotein I IgA: <9 GPI IgA units (ref 0–25)

## 2017-07-22 LAB — ANTINUCLEAR ANTIBODIES, IFA: ANTINUCLEAR ANTIBODIES, IFA: NEGATIVE

## 2017-07-24 ENCOUNTER — Inpatient Hospital Stay (HOSPITAL_COMMUNITY): Payer: Medicare Other

## 2017-07-24 ENCOUNTER — Other Ambulatory Visit: Payer: Self-pay

## 2017-07-24 ENCOUNTER — Inpatient Hospital Stay (HOSPITAL_COMMUNITY)
Admission: EM | Admit: 2017-07-24 | Discharge: 2017-07-26 | DRG: 683 | Disposition: A | Discharge status: Home / Self Care | Payer: Medicare Other | Attending: Internal Medicine | Admitting: Internal Medicine

## 2017-07-24 ENCOUNTER — Encounter (HOSPITAL_COMMUNITY): Payer: Self-pay | Admitting: Emergency Medicine

## 2017-07-24 DIAGNOSIS — Z7982 Long term (current) use of aspirin: Secondary | ICD-10-CM

## 2017-07-24 DIAGNOSIS — I63311 Cerebral infarction due to thrombosis of right middle cerebral artery: Secondary | ICD-10-CM

## 2017-07-24 DIAGNOSIS — I10 Essential (primary) hypertension: Secondary | ICD-10-CM | POA: Diagnosis not present

## 2017-07-24 DIAGNOSIS — T503X5A Adverse effect of electrolytic, caloric and water-balance agents, initial encounter: Secondary | ICD-10-CM | POA: Diagnosis not present

## 2017-07-24 DIAGNOSIS — F17210 Nicotine dependence, cigarettes, uncomplicated: Secondary | ICD-10-CM | POA: Diagnosis present

## 2017-07-24 DIAGNOSIS — I69354 Hemiplegia and hemiparesis following cerebral infarction affecting left non-dominant side: Secondary | ICD-10-CM

## 2017-07-24 DIAGNOSIS — I517 Cardiomegaly: Secondary | ICD-10-CM | POA: Diagnosis not present

## 2017-07-24 DIAGNOSIS — R296 Repeated falls: Secondary | ICD-10-CM | POA: Diagnosis not present

## 2017-07-24 DIAGNOSIS — I110 Hypertensive heart disease with heart failure: Secondary | ICD-10-CM | POA: Diagnosis present

## 2017-07-24 DIAGNOSIS — N179 Acute kidney failure, unspecified: Secondary | ICD-10-CM | POA: Diagnosis not present

## 2017-07-24 DIAGNOSIS — Y92230 Patient room in hospital as the place of occurrence of the external cause: Secondary | ICD-10-CM | POA: Diagnosis not present

## 2017-07-24 DIAGNOSIS — R2681 Unsteadiness on feet: Secondary | ICD-10-CM | POA: Diagnosis present

## 2017-07-24 DIAGNOSIS — Z79899 Other long term (current) drug therapy: Secondary | ICD-10-CM | POA: Diagnosis not present

## 2017-07-24 DIAGNOSIS — Z8679 Personal history of other diseases of the circulatory system: Secondary | ICD-10-CM | POA: Diagnosis not present

## 2017-07-24 DIAGNOSIS — I69322 Dysarthria following cerebral infarction: Secondary | ICD-10-CM

## 2017-07-24 DIAGNOSIS — F1010 Alcohol abuse, uncomplicated: Secondary | ICD-10-CM | POA: Diagnosis present

## 2017-07-24 DIAGNOSIS — E860 Dehydration: Secondary | ICD-10-CM | POA: Diagnosis not present

## 2017-07-24 DIAGNOSIS — R7989 Other specified abnormal findings of blood chemistry: Secondary | ICD-10-CM | POA: Diagnosis present

## 2017-07-24 DIAGNOSIS — M329 Systemic lupus erythematosus, unspecified: Secondary | ICD-10-CM | POA: Diagnosis present

## 2017-07-24 DIAGNOSIS — E785 Hyperlipidemia, unspecified: Secondary | ICD-10-CM | POA: Diagnosis present

## 2017-07-24 DIAGNOSIS — I509 Heart failure, unspecified: Secondary | ICD-10-CM | POA: Diagnosis present

## 2017-07-24 DIAGNOSIS — F17290 Nicotine dependence, other tobacco product, uncomplicated: Secondary | ICD-10-CM | POA: Diagnosis present

## 2017-07-24 DIAGNOSIS — M6282 Rhabdomyolysis: Secondary | ICD-10-CM | POA: Diagnosis present

## 2017-07-24 DIAGNOSIS — I639 Cerebral infarction, unspecified: Secondary | ICD-10-CM | POA: Diagnosis present

## 2017-07-24 HISTORY — DX: Acute kidney failure, unspecified: N17.9

## 2017-07-24 HISTORY — DX: Pure hypercholesterolemia, unspecified: E78.00

## 2017-07-24 LAB — CBC WITH DIFFERENTIAL/PLATELET
BASOS ABS: 0 10*3/uL (ref 0.0–0.1)
Basophils Relative: 1 %
EOS ABS: 0 10*3/uL (ref 0.0–0.7)
EOS PCT: 0 %
HCT: 40.1 % (ref 39.0–52.0)
HEMOGLOBIN: 12.9 g/dL — AB (ref 13.0–17.0)
LYMPHS ABS: 1 10*3/uL (ref 0.7–4.0)
LYMPHS PCT: 22 %
MCH: 26.4 pg (ref 26.0–34.0)
MCHC: 32.2 g/dL (ref 30.0–36.0)
MCV: 82.2 fL (ref 78.0–100.0)
Monocytes Absolute: 0.4 10*3/uL (ref 0.1–1.0)
Monocytes Relative: 8 %
NEUTROS PCT: 69 %
Neutro Abs: 3 10*3/uL (ref 1.7–7.7)
PLATELETS: 138 10*3/uL — AB (ref 150–400)
RBC: 4.88 MIL/uL (ref 4.22–5.81)
RDW: 15.4 % (ref 11.5–15.5)
WBC: 4.4 10*3/uL (ref 4.0–10.5)

## 2017-07-24 LAB — BASIC METABOLIC PANEL
ANION GAP: 8 (ref 5–15)
Anion gap: 11 (ref 5–15)
BUN: 35 mg/dL — AB (ref 6–20)
BUN: 28 mg/dL — ABNORMAL HIGH (ref 6–20)
CALCIUM: 8.6 mg/dL — AB (ref 8.9–10.3)
CHLORIDE: 101 mmol/L (ref 101–111)
CO2: 24 mmol/L (ref 22–32)
CO2: 28 mmol/L (ref 22–32)
CREATININE: 2.21 mg/dL — AB (ref 0.61–1.24)
Calcium: 9.2 mg/dL (ref 8.9–10.3)
Chloride: 102 mmol/L (ref 101–111)
Creatinine, Ser: 2.91 mg/dL — ABNORMAL HIGH (ref 0.61–1.24)
GFR, EST AFRICAN AMERICAN: 24 mL/min — AB (ref >60)
GFR, EST AFRICAN AMERICAN: 34 mL/min — AB (ref >60)
GFR, EST NON AFRICAN AMERICAN: 21 mL/min — AB (ref >60)
GFR, EST NON AFRICAN AMERICAN: 29 mL/min — AB (ref >60)
Glucose, Bld: 106 mg/dL — ABNORMAL HIGH (ref 65–99)
Glucose, Bld: 103 mg/dL — ABNORMAL HIGH (ref 65–99)
POTASSIUM: 3.9 mmol/L (ref 3.5–5.1)
Potassium: 3.6 mmol/L (ref 3.5–5.1)
SODIUM: 138 mmol/L (ref 135–145)
Sodium: 136 mmol/L (ref 135–145)

## 2017-07-24 LAB — URINALYSIS, ROUTINE W REFLEX MICROSCOPIC
Bacteria, UA: NONE SEEN
Bilirubin Urine: NEGATIVE
Glucose, UA: NEGATIVE mg/dL
KETONES UR: NEGATIVE mg/dL
Leukocytes, UA: NEGATIVE
Nitrite: NEGATIVE
PROTEIN: NEGATIVE mg/dL
Specific Gravity, Urine: 1.006 (ref 1.005–1.030)
pH: 6 (ref 5.0–8.0)

## 2017-07-24 LAB — COOXEMETRY PANEL
CARBOXYHEMOGLOBIN: 2 % — AB (ref 0.5–1.5)
Methemoglobin: 0.9 % (ref 0.0–1.5)
O2 Saturation: 87.6 %
TOTAL HEMOGLOBIN: 13.2 g/dL (ref 12.0–16.0)

## 2017-07-24 LAB — GLUCOSE, CAPILLARY: GLUCOSE-CAPILLARY: 98 mg/dL (ref 65–99)

## 2017-07-24 LAB — CK: Total CK: 503 U/L — ABNORMAL HIGH (ref 49–397)

## 2017-07-24 MED ORDER — ASPIRIN 325 MG PO TABS
325.0000 mg | ORAL_TABLET | Freq: Every day | ORAL | Status: DC
  Administered 2017-07-24 – 2017-07-26 (×3): 325 mg via ORAL
  Filled 2017-07-24 (×3): qty 1

## 2017-07-24 MED ORDER — FOLIC ACID 1 MG PO TABS
1.0000 mg | ORAL_TABLET | Freq: Every day | ORAL | Status: DC
  Administered 2017-07-24 – 2017-07-26 (×3): 1 mg via ORAL
  Filled 2017-07-24 (×3): qty 1

## 2017-07-24 MED ORDER — ONDANSETRON HCL 4 MG PO TABS: 4.0000 mg | ORAL_TABLET | Freq: Four times a day (QID) | ORAL | Status: DC | PRN

## 2017-07-24 MED ORDER — ADULT MULTIVITAMIN W/MINERALS CH
1.0000 | ORAL_TABLET | Freq: Every day | ORAL | Status: DC
  Administered 2017-07-24 – 2017-07-26 (×3): 1 via ORAL
  Filled 2017-07-24 (×3): qty 1

## 2017-07-24 MED ORDER — ONDANSETRON HCL 4 MG/2ML IJ SOLN: 4.0000 mg | Freq: Four times a day (QID) | INTRAMUSCULAR | Status: DC | PRN

## 2017-07-24 MED ORDER — ACETAMINOPHEN 650 MG RE SUPP: 650.0000 mg | Freq: Four times a day (QID) | RECTAL | Status: DC | PRN

## 2017-07-24 MED ORDER — LORAZEPAM 1 MG PO TABS: 1.0000 mg | ORAL_TABLET | Freq: Four times a day (QID) | ORAL | Status: DC | PRN

## 2017-07-24 MED ORDER — ACETAMINOPHEN 325 MG PO TABS: 650.0000 mg | ORAL_TABLET | Freq: Four times a day (QID) | ORAL | Status: DC | PRN

## 2017-07-24 MED ORDER — VITAMIN B-1 100 MG PO TABS
100.0000 mg | ORAL_TABLET | Freq: Every day | ORAL | Status: DC
  Administered 2017-07-24 – 2017-07-26 (×3): 100 mg via ORAL
  Filled 2017-07-24 (×3): qty 1

## 2017-07-24 MED ORDER — SODIUM CHLORIDE 0.9 % IV SOLN
INTRAVENOUS | Status: DC
  Administered 2017-07-24 – 2017-07-25 (×2): via INTRAVENOUS

## 2017-07-24 MED ORDER — SODIUM CHLORIDE 0.9 % IV BOLUS (SEPSIS)
1000.0000 mL | Freq: Once | INTRAVENOUS | Status: AC
Start: 2017-07-24 — End: 2017-07-24
  Administered 2017-07-24: 1000 mL via INTRAVENOUS

## 2017-07-24 MED ORDER — LORAZEPAM 2 MG/ML IJ SOLN: 1.0000 mg | Freq: Four times a day (QID) | INTRAMUSCULAR | Status: DC | PRN

## 2017-07-24 MED ORDER — HEPARIN SODIUM (PORCINE) 5000 UNIT/ML IJ SOLN
5000.0000 [IU] | Freq: Three times a day (TID) | INTRAMUSCULAR | Status: DC
  Administered 2017-07-24 – 2017-07-26 (×6): 5000 [IU] via SUBCUTANEOUS
  Filled 2017-07-24 (×4): qty 1

## 2017-07-24 MED ORDER — THIAMINE HCL 100 MG/ML IJ SOLN: 100.0000 mg | Freq: Every day | INTRAMUSCULAR | Status: DC

## 2017-07-24 NOTE — ED Notes (Signed)
Admitting at bedside 

## 2017-07-24 NOTE — ED Triage Notes (Signed)
Pt c/o unsteady gait started today, pt is been seen multiple time on different placed on the hospital sleeping and is been taking out by security, pt able to ambulate to triage with steady gait, pt AO x 4, no neuro deficit noticed.

## 2017-07-24 NOTE — H&P (Signed)
History and Physical    Isaac Reyes ZMO:[id number] DOB: Jan 19, 1951 DOA: 07/24/2017  PCP: [name], MD  Patient coming from: Home  I have personally briefly reviewed patient's old medical records in [place]  Chief Complaint: Dysarthria  HPI: Isaac Reyes is a 67 y.o. male with medical history significant of EtOH abuse, reported lupus, recent stroke admission on our service from 2/28 to 3/1.  Patient presents for evaluation of his unsteady gait and slurred speech.  He was recently diagnosed with a stroke, and since being at home he has been concerned about his difficulty walking and speech.  He does not feel it is any worse.  He also reports that the gas was shut off to his house earlier in the day because it was found to be leaking carbon monoxide.  He denies any headaches or any other new symptoms.   ED Course: CO level 2.0%.  Concerning however is that his creatinine which was 1.30 and 1.25 on 2/27 and 2/28, escalated to 2.1 at time of discharge on 3/1.  And has continued to escalate to 2.9 today.   Review of Systems: As per HPI otherwise 10 point review of systems negative.   Past Medical History:  Diagnosis Date  . Acute CVA (cerebrovascular accident) ([place]) 06/2017  . Anemia   . Lupus   . Lupus   . NSVT (nonsustained ventricular tachycardia) (HCC)     Past Surgical History:  Procedure Laterality Date  . KNEE SURGERY       reports that he has been smoking cigars and cigarettes.  He has smoked for the past 22.00 years. he has never used smokeless tobacco. He reports that he drinks about 1.0 - 1.5 oz of alcohol per week. He reports that he does not use drugs.  No Known Allergies  Family History  Problem Relation Age of Onset  . Heart attack Father      Prior to Admission medications   Medication Sig Start Date End Date Taking? Authorizing Provider  aspirin 325 MG tablet Take 1 tablet (325 mg total) by mouth daily. 07/20/17  Yes [name], DO    Multiple Vitamin (MULTIVITAMIN WITH MINERALS) TABS tablet Take 1 tablet by mouth daily.   Yes [provider]  atorvastatin (LIPITOR) 80 MG tablet Take 1 tablet (80 mg total) by mouth daily at 6 PM. Patient not taking: Reported on 07/24/2017 07/19/17   [name], DO    Physical Exam: Vitals:   07/24/17 0400 07/24/17 0415 07/24/17 0430 07/24/17 0500  BP: 128/82 (!) 143/92 (!) 148/95 (!) 150/95  Pulse: 65 (!) 57 63 77  Resp: 15 15 14 16   Temp:      SpO2: 99% 99% 100% 100%  Weight:      Height:        Constitutional: NAD, calm, comfortable Eyes: PERRL, lids and conjunctivae normal ENMT: Mucous membranes are moist. Posterior pharynx clear of any exudate or lesions.Normal dentition.  Neck: normal, supple, no masses, no thyromegaly Respiratory: clear to auscultation bilaterally, no wheezing, no crackles. Normal respiratory effort. No accessory muscle use.  Cardiovascular: Regular rate and rhythm, no murmurs / rubs / gallops. No extremity edema. 2+ pedal pulses. No carotid bruits.  Abdomen: no tenderness, no masses palpated. No hepatosplenomegaly. Bowel sounds positive.  Musculoskeletal: no clubbing / cyanosis. No joint deformity upper and lower extremities. Good ROM, no contractures. Normal muscle tone.  Skin: no rashes, lesions, ulcers. No induration Neurologic: Speech dysarthric Psychiatric: Normal  judgment and insight. Alert and oriented to situation. Normal mood.    Labs on Admission: I have personally reviewed following labs and imaging studies  CBC: Recent Labs  Lab 07/17/17 1925 07/18/17 0628 07/19/17 1003 07/24/17 0341  WBC 3.4* 3.9* 4.2 4.4  NEUTROABS 1.6*  --   --  3.0  HGB 12.4* 12.8* 11.7* 12.9*  HCT 39.6 38.3* 37.5* 40.1  MCV 82.7 82.0 82.6 82.2  PLT 175 145* 175 546*   Basic Metabolic Panel: Recent Labs  Lab 07/17/17 1925 07/18/17 0628 07/19/17 1003 07/24/17 0341  NA 138 137 138 136  K 3.6 3.3* 3.3* 3.9  CL 101 102 101 101  CO2 27 24 28 24    GLUCOSE 101* 98 92 106*  BUN 16 13 24* 35*  CREATININE 1.30* 1.25* 2.16* 2.91*  CALCIUM 9.2 8.9 8.8* 9.2  MG  --  1.9 2.4  --    GFR: Estimated Creatinine Clearance: 27.5 mL/min (A) (by C-G formula based on SCr of 2.91 mg/dL (H)). Liver Function Tests: Recent Labs  Lab 07/18/17 0628  AST 17  ALT 14*  ALKPHOS 99  BILITOT 1.1  PROT 6.7  ALBUMIN 3.3*   No results for input(s): LIPASE, AMYLASE in the last 168 hours. No results for input(s): AMMONIA in the last 168 hours. Coagulation Profile: No results for input(s): INR, PROTIME in the last 168 hours. Cardiac Enzymes: Recent Labs  Lab 07/18/17 0628  TROPONINI 0.07*   BNP (last 3 results) No results for input(s): PROBNP in the last 8760 hours. HbA1C: No results for input(s): HGBA1C in the last 72 hours. CBG: No results for input(s): GLUCAP in the last 168 hours. Lipid Profile: No results for input(s): CHOL, HDL, LDLCALC, TRIG, CHOLHDL, LDLDIRECT in the last 72 hours. Thyroid Function Tests: No results for input(s): TSH, T4TOTAL, FREET4, T3FREE, THYROIDAB in the last 72 hours. Anemia Panel: No results for input(s): VITAMINB12, FOLATE, FERRITIN, TIBC, IRON, RETICCTPCT in the last 72 hours. Urine analysis:    Component Value Date/Time   COLORURINE YELLOW 07/17/2017 1909   APPEARANCEUR CLEAR 07/17/2017 1909   LABSPEC 1.015 07/17/2017 1909   PHURINE 6.0 07/17/2017 1909   GLUCOSEU NEGATIVE 07/17/2017 1909   HGBUR NEGATIVE 07/17/2017 1909   BILIRUBINUR NEGATIVE 07/17/2017 1909   KETONESUR NEGATIVE 07/17/2017 1909   PROTEINUR NEGATIVE 07/17/2017 1909   NITRITE NEGATIVE 07/17/2017 1909   LEUKOCYTESUR NEGATIVE 07/17/2017 1909    Radiological Exams on Admission: No results found.  EKG: Independently reviewed.  Assessment/Plan Principal Problem:   Acute kidney failure (HCC) Active Problems:   Alcohol abuse   CVA (cerebral vascular accident) ([place])    1. AKF - 1. DDx includes: Dehydration due to decreased PO  intake following stroke, obstructive, SLE 2. SLP eval 3. IVF: NS at 125 cc/hr, got 1L bolus in ED 4. US renal to R/O obstruction 5. Urine lytes 6. UA 7. CK 8. Strict intake and output 9. SLE nephritis seems less likely with negative ANA and negative anti-ds DNA (only considering it because of patients reported history of 'lupus'). 10. If no rapid improvement then likely warrants nephrology evaluation. 2. EtOH Abuse - CIWA 3. CVA - 1. SLP eval 2. dosent seem to have new stroke symptoms to suggest extension of stroke at this time. 3. Continue ASA 4. HLD - will leave lipitor on hold given the AKF, looks like he hadnt been taking this at home anyhow  DVT prophylaxis: Lovenox Code Status: Full Family Communication: No family in room Disposition Plan: TBD  Consults called: None Admission status: Admit to inpatient   [place], [place] [facility] Pager [phone]  If 7AM-7PM, please contact day team taking care of patient [email or web address] Password [email or web address]  07/24/2017, 5:30 AM

## 2017-07-24 NOTE — ED Notes (Signed)
Gave patient the urinal patient trying to get a urine sample call bell in reach

## 2017-07-24 NOTE — ED Notes (Signed)
Patient transported to Ultrasound 

## 2017-07-24 NOTE — ED Notes (Signed)
Pt was eating meal that was given to pt prior to this RN assuming care. Previous RN reported to this RN that pt failed his swallow screen and was awaiting swallow eval by ST. Previous RN was unaware that pt was given food prior to pt being relocated to ED holding pod as she did not order food tray.

## 2017-07-24 NOTE — Progress Notes (Signed)
[facility]  This is a 67 y.o. male with medical history significant of EtOH abuse, reported lupus, recent stroke admission on our service from 2/28 to 3/1. He presents with unsteady gait and slurred speech. He also reports that the gas was shut off to his house earlier in the day because it was found to be leaking carbon monoxide.  He is noted to have an elevated Cr in the ER which has increased from 1.25 on 2/28 to 2.16 on 3/5 and then 2.91 (today). He admits to poor oral intake.   Principal Problem:   Acute kidney failure ([place]) - suspected to be prerenal - on continuous IV fluids - recheck Cr in AM  Active Problems:   Alcohol abuse - follow for withdrawal    CVA (cerebral vascular accident) ([place]) - with resultant dysarthria- recommended aspirin and Lipitor - CK on admission 503- holding statin  Mild Rhabdomyolysis - see above- recheck tomorrow   [name], MD

## 2017-07-24 NOTE — ED Provider Notes (Signed)
[facility] EMERGENCY DEPARTMENT Provider Note   CSN: [id number] Arrival date & time: 07/24/17  0026     History   Chief Complaint Chief Complaint  Patient presents with  . Gait Problem    HPI Isaac Reyes is a 67 y.o. male.  The history is provided by the patient.  Illness  This is a new problem. The current episode started more than 1 week ago. The problem occurs daily. The problem has not changed since onset.Pertinent negatives include no chest pain and no headaches. The symptoms are aggravated by walking. The symptoms are relieved by rest.  Patient presents for evaluation of his unsteady gait and slurred speech.  He was recently diagnosed with a stroke, and since being at home he has been concerned about his difficulty walking and speech.  He does not feel it is any worse.  He also reports that the gas was shut off to his house earlier in the day because it was found to be leaking carbon monoxide.  He denies any headaches or any other new symptoms.  Past Medical History:  Diagnosis Date  . Acute CVA (cerebrovascular accident) (Isaac Reyes) 06/2017  . Anemia   . Lupus   . Lupus   . NSVT (nonsustained ventricular tachycardia) [facility])     Patient Active Problem List   Diagnosis Date Noted  . CVA (cerebral vascular accident) ([place]) 07/19/2017  . Stroke (cerebrum) ([place]) 07/18/2017  . NSVT (nonsustained ventricular tachycardia) ([place]) 07/18/2017  . Elevated blood pressure reading 07/18/2017  . Normocytic normochromic anemia 07/18/2017  . Malnutrition of moderate degree 07/18/2017  . Normocytic anemia   . Frequent falls   . Renal insufficiency   . Hyperlipidemia   . Heavy smoker   . Alcohol abuse   . Abnormal EKG 03/19/2013  . Pre-operative cardiovascular exam, new EKG abnormalities c/w ischemia 03/19/2013  . LVH (left ventricular hypertrophy) 03/19/2013  . Murmur, cardiac 03/19/2013    Past Surgical History:  Procedure Laterality Date  . KNEE SURGERY           Home Medications    Prior to Admission medications   Medication Sig Start Date End Date Taking? Authorizing Provider  aspirin 325 MG tablet Take 1 tablet (325 mg total) by mouth daily. 07/20/17  Yes [name], DO  Multiple Vitamin (MULTIVITAMIN WITH MINERALS) TABS tablet Take 1 tablet by mouth daily.   Yes [provider]  atorvastatin (LIPITOR) 80 MG tablet Take 1 tablet (80 mg total) by mouth daily at 6 PM. Patient not taking: Reported on 07/24/2017 07/19/17   [name], DO    Family History Family History  Problem Relation Age of Onset  . Heart attack Father     Social History Social History   Tobacco Use  . Smoking status: Current Every Day Smoker    Years: 22.00    Types: Cigars, Cigarettes  . Smokeless tobacco: Never Used  Substance Use Topics  . Alcohol use: Yes    Alcohol/week: 1.0 - 1.5 oz    Types: 2 - 3 Standard drinks or equivalent per week  . Drug use: No     Allergies   Patient has no known allergies.   Review of Systems Review of Systems  Constitutional: Negative for fever.  Cardiovascular: Negative for chest pain.  Neurological: Positive for speech difficulty. Negative for headaches.  All other systems reviewed and are negative.    Physical Exam Updated Vital Signs BP 122/74   Pulse 65  Temp 98.5 F (36.9 C)   Resp (!) 7   Ht 1.88 m (6\' 2" )   Wt 78.9 kg (174 lb)   SpO2 98%   BMI 22.34 kg/m   Physical Exam CONSTITUTIONAL: Elderly/frail, no acute distress HEAD: Normocephalic/atraumatic EYES: EOMI/PERRL ENMT: Mucous membranes moist NECK: supple no meningeal signs SPINE/BACK:entire spine nontender CV: S1/S2 noted, no murmurs/rubs/gallops noted LUNGS: Lungs are clear to auscultation bilaterally, no apparent distress ABDOMEN: soft, nontender GU:no cva tenderness NEURO: Pt is awake/alert/appropriate, moves all extremitiesx4.  No facial droop.  No obvious arm or leg drift.  Patient can ambulate with steady gait.   Dysarthria noted EXTREMITIES: pulses normal/equal, full ROM SKIN: warm, color normal PSYCH: no abnormalities of mood noted, alert and oriented to situation   ED Treatments / Results  Labs (all labs ordered are listed, but only abnormal results are displayed) Labs Reviewed  BASIC METABOLIC PANEL - Abnormal; Notable for the following components:      Result Value   Glucose, Bld 106 (*)    BUN 35 (*)    Creatinine, Ser 2.91 (*)    GFR calc non Af Amer 21 (*)    GFR calc Af Amer 24 (*)    All other components within normal limits  CBC WITH DIFFERENTIAL/PLATELET - Abnormal; Notable for the following components:   Hemoglobin 12.9 (*)    Platelets 138 (*)    All other components within normal limits  COOXEMETRY PANEL - Abnormal; Notable for the following components:   Carboxyhemoglobin 2.0 (*)    All other components within normal limits    EKG  EKG Interpretation  Date/Time:  Wednesday July 24 2017 03:44:45 EST Ventricular Rate:  62 PR Interval:    QRS Duration: 104 QT Interval:  441 QTC Calculation: 448 R Axis:   -34 Text Interpretation:  Sinus rhythm Atrial premature complexes Abnormal R-wave progression, early transition LVH with secondary repolarization abnormality No significant change since last tracing Confirmed by Ripley Fraise [phone]) on 07/24/2017 3:53:44 AM       Radiology No results found.  Procedures Procedures  Medications Ordered in ED Medications  0.9 %  sodium chloride infusion (not administered)  sodium chloride 0.9 % bolus 1,000 mL (1,000 mLs Intravenous New Bag/Given 07/24/17 0517)     Initial Impression / Assessment and Plan / ED Course  I have reviewed the triage vital signs and the nursing notes.  Pertinent labs results that were available during my care of the patient were reviewed by me and considered in my medical decision making (see chart for details).     3:16 AM Patient is a poor historian, and his dysarthria from recent stroke  limits his history.  However it appears he is mostly concerned about the continued difficulty speaking and feeling that his gait is off.  He also mentions that he may have been exposed to carbon monoxide at home.  Clinically, he is in no acute distress.  No physical signs of CO toxicity at this time We will send labs and CO level. When I review his chart from recent admission, it appears his neuro exam is consistent at time of discharge on March 1 5:18 AM No signs of CO toxicity.  However he does have worsening renal failure when compared to recent admission.  He denies any vomiting or diarrhea.  He does report decreased p.o. intake  patient is a poor historian and his exam is also limited due to his dysarthria Patient would benefit from admission due to worsening  renal failure of unclear etiology  5:22 AM Discussed with Dr. Alcario Drought.  Patient will be admitted to the hospitalist service Final Clinical Impressions(s) / ED Diagnoses   Final diagnoses:  AKI (acute kidney injury) [facility])  Dehydration    ED Discharge Orders    None       Ripley Fraise, MD 07/24/17 [phone]

## 2017-07-25 DIAGNOSIS — I10 Essential (primary) hypertension: Secondary | ICD-10-CM

## 2017-07-25 DIAGNOSIS — I517 Cardiomegaly: Secondary | ICD-10-CM

## 2017-07-25 DIAGNOSIS — M6282 Rhabdomyolysis: Secondary | ICD-10-CM

## 2017-07-25 LAB — BASIC METABOLIC PANEL
ANION GAP: 10 (ref 5–15)
BUN: 28 mg/dL — ABNORMAL HIGH (ref 6–20)
CO2: 24 mmol/L (ref 22–32)
Calcium: 8.2 mg/dL — ABNORMAL LOW (ref 8.9–10.3)
Chloride: 106 mmol/L (ref 101–111)
Creatinine, Ser: 1.87 mg/dL — ABNORMAL HIGH (ref 0.61–1.24)
GFR calc non Af Amer: 36 mL/min — ABNORMAL LOW (ref >60)
GFR, EST AFRICAN AMERICAN: 41 mL/min — AB (ref >60)
GLUCOSE: 92 mg/dL (ref 65–99)
POTASSIUM: 3.8 mmol/L (ref 3.5–5.1)
Sodium: 140 mmol/L (ref 135–145)

## 2017-07-25 MED ORDER — AMLODIPINE BESYLATE 5 MG PO TABS
5.0000 mg | ORAL_TABLET | Freq: Every day | ORAL | Status: DC
  Administered 2017-07-25: 5 mg via ORAL

## 2017-07-25 MED ORDER — AMLODIPINE BESYLATE 10 MG PO TABS
10.0000 mg | ORAL_TABLET | Freq: Every day | ORAL | Status: DC
Start: 2017-07-26 — End: 2017-07-26
  Administered 2017-07-26: 10 mg via ORAL
  Filled 2017-07-25 (×2): qty 1

## 2017-07-25 MED ORDER — SODIUM CHLORIDE 0.45 % IV SOLN
INTRAVENOUS | Status: DC
  Administered 2017-07-25 – 2017-07-26 (×3): via INTRAVENOUS

## 2017-07-25 NOTE — Progress Notes (Signed)
PROGRESS NOTE    Isaac Reyes [facility]   GGY:[id number]  DOB: 1950/10/17  DOA: 07/24/2017 PCP: [name], MD   Brief Narrative:    This is a 67 y.o.malewith medical history significant ofEtOH abuse, reported lupus, recent stroke with admission to our service from 2/28 to 3/1. He presents with unsteady gait and slurred speech. He also reports that the gas was shutoff to his house earlier in the day because it was found to be leaking carbon monoxide.  He is noted to have an elevated Cr in the ER which has increased from 1.25 on 2/28 to 2.16 on 3/5 and then 2.91 (today). He admits to poor oral intake.    Subjective: He has no complaints today.  ROS: no complaints of nausea, vomiting, constipation diarrhea, cough, dyspnea or dysuria. No other complaints.   Assessment & Plan:  Principal Problem:   Acute kidney failure ([place]) - suspected to be prerenal - on continuous IV fluids -  Cr this AM shows improvement but not yet back to baseline- will cont IVF  Active Problems:   Alcohol abuse - no withdrawal symptoms noted in the hospital    CVA (cerebral vascular accident)   - recent right anterior putamen infarct  - with resultant dysarthria and mild left hemiparesis - Neuro recommended full dose aspirin and Lipitor - needs BP control as well  Mild rhabdomyolysis Hyperlipidemia with LDL of 124 on recent admission - CK on admission 503- holding statin for now- recheck CK tomorrow  Mild Rhabdomyolysis - see above   HTN - SBP as high as 170 which may be in part due to ongoing NS infusion - not on oupt medications - start Norvasc- changefluids  to 1/2 NS  LVH ECHO last admission: The left ventriuclar myocardium is hyperechogenic and right   ventricular wall thicknes is also increased. Consider an   infiltrative process, such as amyloidosis. - plan to discuss with cardiology  DVT prophylaxis: Heparin Code Status: Full code Family Communication:  Disposition Plan: home  hopefully tomorrow Consultants:    Procedures:    Antimicrobials:  Anti-infectives (From admission, onward)   None       Objective: Vitals:   07/24/17 1553 07/24/17 2135 07/25/17 0441 07/25/17 1038  BP: (!) 172/76 (!) 137/59 (!) 160/90 (!) 170/76  Pulse: (!) 45 72 64 66  Resp: 18 18 18 17   Temp: (!) 97.3 F (36.3 C) 98 F (36.7 C) 98.6 F (37 C) 98.1 F (36.7 C)  TempSrc: Oral Oral Oral Oral  SpO2: 99% 100% 99% 100%  Weight:      Height:        Intake/Output Summary (Last 24 hours) at 07/25/2017 1240 Last data filed at 07/25/2017 1049 Gross per 24 hour  Intake 2282.08 ml  Output 500 ml  Net 1782.08 ml   Filed Weights   07/24/17 0037  Weight: 78.9 kg (174 lb)    Examination: General exam: Appears comfortable  HEENT: PERRLA, oral mucosa moist, no sclera icterus or thrush Respiratory system: Clear to auscultation. Respiratory effort normal. Cardiovascular system: S1 & S2 heard, RRR.    Gastrointestinal system: Abdomen soft, non-tender, nondistended. Normal bowel sound. No organomegaly Central nervous system: Alert and oriented. No focal neurological deficits. Extremities: No cyanosis, clubbing or edema Skin: No rashes or ulcers Psychiatry:  Mood & affect appropriate.     Data Reviewed: I have personally reviewed following labs and imaging studies  CBC: Recent Labs  Lab 07/19/17 1003 07/24/17 0341  WBC 4.2 4.4  NEUTROABS  --  3.0  HGB 11.7* 12.9*  HCT 37.5* 40.1  MCV 82.6 82.2  PLT 175 629*   Basic Metabolic Panel: Recent Labs  Lab 07/19/17 1003 07/24/17 0341 07/24/17 1647 07/25/17 0623  NA 138 136 138 140  K 3.3* 3.9 3.6 3.8  CL 101 101 102 106  CO2 28 24 28 24   GLUCOSE 92 106* 103* 92  BUN 24* 35* 28* 28*  CREATININE 2.16* 2.91* 2.21* 1.87*  CALCIUM 8.8* 9.2 8.6* 8.2*  MG 2.4  --   --   --    GFR: Estimated Creatinine Clearance: 42.8 mL/min (A) (by C-G formula based on SCr of 1.87 mg/dL (H)). Liver Function Tests: No results for  input(s): AST, ALT, ALKPHOS, BILITOT, PROT, ALBUMIN in the last 168 hours. No results for input(s): LIPASE, AMYLASE in the last 168 hours. No results for input(s): AMMONIA in the last 168 hours. Coagulation Profile: No results for input(s): INR, PROTIME in the last 168 hours. Cardiac Enzymes: Recent Labs  Lab 07/24/17 0341  CKTOTAL 503*   BNP (last 3 results) No results for input(s): PROBNP in the last 8760 hours. HbA1C: No results for input(s): HGBA1C in the last 72 hours. CBG: Recent Labs  Lab 07/24/17 2128  GLUCAP 98   Lipid Profile: No results for input(s): CHOL, HDL, LDLCALC, TRIG, CHOLHDL, LDLDIRECT in the last 72 hours. Thyroid Function Tests: No results for input(s): TSH, T4TOTAL, FREET4, T3FREE, THYROIDAB in the last 72 hours. Anemia Panel: No results for input(s): VITAMINB12, FOLATE, FERRITIN, TIBC, IRON, RETICCTPCT in the last 72 hours. Urine analysis:    Component Value Date/Time   COLORURINE STRAW (A) 07/24/2017 0852   APPEARANCEUR CLEAR 07/24/2017 0852   LABSPEC 1.006 07/24/2017 0852   PHURINE 6.0 07/24/2017 0852   GLUCOSEU NEGATIVE 07/24/2017 0852   HGBUR SMALL (A) 07/24/2017 0852   BILIRUBINUR NEGATIVE 07/24/2017 0852   KETONESUR NEGATIVE 07/24/2017 0852   PROTEINUR NEGATIVE 07/24/2017 0852   NITRITE NEGATIVE 07/24/2017 0852   LEUKOCYTESUR NEGATIVE 07/24/2017 0852   Sepsis Labs: @LABRCNTIP (procalcitonin:4,lacticidven:4) )No results found for this or any previous visit (from the past 240 hour(s)).       Radiology Studies: US Renal  Result Date: 07/24/2017 CLINICAL DATA:  Acute renal failure, lupus. EXAM: RENAL / URINARY TRACT ULTRASOUND COMPLETE COMPARISON:  None. FINDINGS: Right Kidney: Length: 10.4 cm. Parenchymal echogenicity is increased. No hydronephrosis or mass. Left Kidney: Length: 9.8 cm. Parenchymal echogenicity is increased. No hydronephrosis or mass. Bladder: Debris is seen dependently in the bladder. IMPRESSION: 1. Increased renal  parenchymal echogenicity, consistent with chronic medical renal disease. 2. Dependent bladder debris. Electronically Signed   By: Lorin Picket M.D.   On: 07/24/2017 06:39      Scheduled Meds: . amLODipine  5 mg Oral Daily  . aspirin  325 mg Oral Daily  . folic acid  1 mg Oral Daily  . heparin  5,000 Units Subcutaneous Q8H  . multivitamin with minerals  1 tablet Oral Daily  . thiamine  100 mg Oral Daily   Or  . thiamine  100 mg Intravenous Daily   Continuous Infusions: . sodium chloride 75 mL/hr at 07/25/17 0504     LOS: 1 day    Time spent in minutes: 35    [name], MD [facility] Pager: [email or web address] Password [email or web address] 07/25/2017, 12:40 PM

## 2017-07-25 NOTE — Care Management Note (Addendum)
Case Management Note  Patient Details  Name: CHETAN MEHRING MRN: [id number] Date of Birth: 03/14/1951  Subjective/Objective:    History of recent CVA admission,  EtOH abuse; Admitted for  Acute kidney failure. Prior to admission lived at home with mother-takes care of.       Action/Plan: IVF: NS at 125 cc/hr, got 1L bolus in ED; US renal to R/O obstruction.  Urine lytes. UA/CK. Strict intake and output. ETOH Abuse - CIWA; CVA -SLP eval.  Review of patient chart per Epic was referred for outpatient rehab recently, advised that "[facility]" would contact him for first visit.  NCM will continue to monitor for discharge needs.  Expected Discharge Date:    07/26/2017 Expected Discharge Plan:    Home self care  Discharge planning Services   CM consult  Status of Service:   In progress, will continue to follow.  Kristen Cardinal, RN  Nurse case manager [facility] 07/25/2017, 3:31 PM

## 2017-07-26 DIAGNOSIS — E785 Hyperlipidemia, unspecified: Secondary | ICD-10-CM

## 2017-07-26 DIAGNOSIS — I10 Essential (primary) hypertension: Secondary | ICD-10-CM

## 2017-07-26 DIAGNOSIS — R296 Repeated falls: Secondary | ICD-10-CM

## 2017-07-26 LAB — BASIC METABOLIC PANEL
Anion gap: 7 (ref 5–15)
BUN: 20 mg/dL (ref 6–20)
CO2: 26 mmol/L (ref 22–32)
CREATININE: 1.67 mg/dL — AB (ref 0.61–1.24)
Calcium: 8.1 mg/dL — ABNORMAL LOW (ref 8.9–10.3)
Chloride: 106 mmol/L (ref 101–111)
GFR calc Af Amer: 47 mL/min — ABNORMAL LOW (ref >60)
GFR, EST NON AFRICAN AMERICAN: 41 mL/min — AB (ref >60)
Glucose, Bld: 90 mg/dL (ref 65–99)
POTASSIUM: 3.8 mmol/L (ref 3.5–5.1)
SODIUM: 139 mmol/L (ref 135–145)

## 2017-07-26 LAB — CK: CK TOTAL: 140 U/L (ref 49–397)

## 2017-07-26 MED ORDER — THIAMINE HCL 100 MG PO TABS: 100.0000 mg | ORAL_TABLET | Freq: Every day | ORAL | 0 refills | Status: DC

## 2017-07-26 MED ORDER — AMLODIPINE BESYLATE 10 MG PO TABS: 10.0000 mg | ORAL_TABLET | Freq: Every day | ORAL | 0 refills | Status: AC

## 2017-07-26 NOTE — Progress Notes (Addendum)
Patient with discharge orders home today.  In to speak with patient, discussed recommendation of [facility].  Patient has never used home health services before.  Offered choice of [facility] providers, No phone number or permanent residence to set up [facility] at this time.  Review of chart shows: Patient has existing outpatient referral sent from recent admission: [facility] [place] [address], [phone], patient advised to follow up on recent referral and verbalized understanding.  Royetta Crochet. [place]

## 2017-07-26 NOTE — Discharge Instructions (Signed)
Your BP is high and I have started Amlodipine for this. Take it daily and check you BP daily and take a record of your BP readings to your next doctor visit.  Drink plenty of water to stay hydrated.  Stop drinking alcohol.   Please take all your medications with you for your next visit with your Primary MD. Please request your Primary MD to go over all hospital test results at the follow up. Please ask your Primary MD to get all Hospital records sent to his/her office.  If you experience worsening of your admission symptoms, develop shortness of breath, chest pain, suicidal or homicidal thoughts or a life threatening emergency, you must seek medical attention immediately by calling 911 or calling your MD.  Dennis Bast must read the complete instructions/literature along with all the possible adverse reactions/side effects for all the medicines you take including new medications that have been prescribed to you. Take new medicines after you have completely understood and accpet all the possible adverse reactions/side effects.   Do not drive when taking pain medications or sedatives.    Do not take more than prescribed Pain, Sleep and Anxiety Medications  If you have smoked or chewed Tobacco in the last 2 yrs please stop. Stop any regular alcohol and or recreational drug use.  Wear Seat belts while driving.

## 2017-07-26 NOTE — Discharge Summary (Addendum)
Physician Discharge Summary  Kyel Purk [facility] POE:[id number] DOB: 12-Mar-1951 DOA: 07/24/2017  PCP: [name], MD  Admit date: 07/24/2017 Discharge date: 07/26/2017  Admitted From: home  Disposition:  home   Recommendations for Outpatient Follow-up:  1. F/u on HTN  Home Health:  ordered Discharge Condition:  stable   CODE STATUS:  Full code   Consultations:  none    Discharge Diagnoses:  Principal Problem:   Acute kidney failure ([place]) Active Problems:   LVH (left ventricular hypertrophy)   Frequent falls   Hyperlipidemia   Alcohol abuse   CVA (cerebral vascular accident) ([place])   Benign essential HTN    Subjective: He feels well today and has no complaints.   Brief Summary: This is a67 y.o.malewith medical history significant ofEtOH abuse, reported lupus, recent stroke with admission to our service from 2/28 to 3/1. He presents with unsteady gait and slurred speech.He also reports that the gas was shutoff to his house earlier in the day because it was found to be leaking carbon monoxide. He is noted to have an elevated Cr in the ER which has increased from 1.25 on 2/28 to 2.16 on 3/5 and then 2.91 (today). He admits to poor oral intake.    Hospital Course:  Principal Problem: Acute kidney failure ([place]) - suspected to be prerenal and treated with continuous IV fluids - he has had an improvement in Cr and has good urine output- he is advised to maintain oral hydration at home to prevent recurrence - renal ultrasound suggestive of chronic medical-renal disease - see below   Active Problems: Alcohol abuse - no withdrawal symptoms noted in the hospital- advised to avoid alcohol  CVA (cerebral vascular accident)   - recent right anterior putamen infarct  - with resultant dysarthria and mild left hemiparesis - Neuro recommended full dose aspirin and Lipitor - needs adequate BP control as well  Unsteady gait/ falls -? Due to CVA vs due to excess  ETOH use- he was able to ambulate with the nurse but feels a little unsteady- will order home health PT    Mild rhabdomyolysis Hyperlipidemia with LDL of 124 on recent admission - ? If rhabdo was related to fall at home  - CK on admission 503 and now is 140  HTN - SBP as high as 170 which may be in part due to ongoing NS infusion in the hospital - not on any oupt medications - started Norvasc & changed fluids  to 1/2 NS- BP today is 142/68 after Norvasc given -  have discussed with patient that he will need to take this medication regularly, check his BP daily and see his PCP in 1 wk for a BP check  LVH ECHO report (last admission): EF 55% with Grade 1 d CHF The left ventriuclar myocardium is hyperechogenic and right ventricular wall thicknes is also increased. Consider an infiltrative process, such as amyloidosis. -   discussed with cardiology- I was recommended that he will need to f/u with Dr Sallyanne Kuster who did the ECHO read- they will make the appt with him - needs adequate BP control to prevent progression of LVH    Discharge Exam: Vitals:   07/26/17 0551 07/26/17 0840  BP: (!) 169/87 (!) 142/68  Pulse: 64 60  Resp: 15 16  Temp: 98.1 F (36.7 C) 97.8 F (36.6 C)  SpO2: 100% 100%   Vitals:   07/25/17 1620 07/25/17 2154 07/26/17 0551 07/26/17 0840  BP: (!) 159/74 (!) 141/64 (!) 169/87 Marland Kitchen)  142/68  Pulse: 60 78 64 60  Resp: 18 17 15 16   Temp: 98.9 F (37.2 C) 98.6 F (37 C) 98.1 F (36.7 C) 97.8 F (36.6 C)  TempSrc: Oral   Oral  SpO2: 100% 99% 100% 100%  Weight:  81.2 kg (179 lb)    Height:        General: Pt is alert, awake, not in acute distress Cardiovascular: RRR, S1/S2 +, no rubs, no gallops Respiratory: CTA bilaterally, no wheezing, no rhonchi Abdominal: Soft, NT, ND, bowel sounds + Extremities: no edema, no cyanosis   Discharge Instructions  Discharge Instructions    Diet - low sodium heart healthy   Complete by:  As directed    Increase  activity slowly   Complete by:  As directed      Allergies as of 07/26/2017   No Known Allergies     Medication List    TAKE these medications   amLODipine 10 MG tablet Commonly known as:  NORVASC Take 1 tablet (10 mg total) by mouth daily. Start taking on:  07/27/2017   aspirin 325 MG tablet Take 1 tablet (325 mg total) by mouth daily.   atorvastatin 80 MG tablet Commonly known as:  LIPITOR Take 1 tablet (80 mg total) by mouth daily at 6 PM.   multivitamin with minerals Tabs tablet Take 1 tablet by mouth daily.   thiamine 100 MG tablet Take 1 tablet (100 mg total) by mouth daily. Start taking on:  07/27/2017      Follow-up Information    [name], MD. Schedule an appointment as soon as possible for a visit.   Specialty:  Internal Medicine Why:  he will need to do the following blood work: Advertising account executive information: [place] [place] [address] [phone]          No Known Allergies   Procedures/Studies:    Dg Chest 2 View  Result Date: 07/18/2017 CLINICAL DATA:  67 year old male with stroke. EXAM: CHEST  2 VIEW COMPARISON:  None. FINDINGS: The heart size and mediastinal contours are within normal limits. Both lungs are clear. The visualized skeletal structures are unremarkable. IMPRESSION: No active cardiopulmonary disease. Electronically Signed   By: Anner Crete M.D.   On: 07/18/2017 05:28   Ct Head Wo Contrast  Result Date: 07/17/2017 CLINICAL DATA:  Several recent falls.  Gait difficulty EXAM: CT HEAD WITHOUT CONTRAST TECHNIQUE: Contiguous axial images were obtained from the base of the skull through the vertex without intravenous contrast. COMPARISON:  None. FINDINGS: Brain: The ventricles are normal in size and configuration. There is no intracranial mass hemorrhage, extra-axial fluid collection, or midline shift. There is decreased attenuation in the medial right occipital lobe, concerning for acute/recent infarct in this  area. There is extensive small vessel disease in the centra semiovale bilaterally. There are multiple lacunar infarcts in basal ganglia regions bilaterally. There is decreased attenuation in the medial right lentiform nucleus compared to the left side, concerning for recent infarct in this area. There are foci of small vessel disease in each thalamus with a focal prior infarct in the mid right thalamus. Vascular: No hyperdense vessels are evident. No appreciable vascular calcification is evident. Skull: Bony calvarium appears intact. Sinuses/Orbits: There is mucosal thickening in several ethmoid air cells. Other visualized paranasal sinuses are clear. Orbits appear symmetric bilaterally. Other: Mastoid air cells are clear. There is debris in the left external auditory canal. IMPRESSION: 1. Decreased attenuation in the medial aspect of the  right occipital lobe, concerning for recent and potentially acute infarct. 2. Extensive supratentorial small vessel disease. Multiple lacunar infarcts as well as small vessel disease are noted throughout both basal ganglia regions. There is decreased attenuation in the medial right lentiform nucleus compared to the left side, suggesting early/recent infarct in this area. Prior infarcts and small vessel disease in the thalami, more discrete on right than on the left. 3.  No mass or hemorrhage.  No extra-axial fluid collection. 4.  Mucosal thickening noted in several ethmoid air cells. 5.  Probable cerumen in the left external auditory canal. Electronically Signed   By: Lowella Grip III M.D.   On: 07/17/2017 19:57   Mr Brain Wo Contrast  Result Date: 07/18/2017 CLINICAL DATA:  67 y/o  M; slurred speech and increasing falls. EXAM: MRI HEAD WITHOUT CONTRAST TECHNIQUE: Multiplanar, multiecho pulse sequences of the brain and surrounding structures were obtained without intravenous contrast. COMPARISON:  07/17/2017 CT head. FINDINGS: Brain: 7 mm focus of reduced diffusion in the  right anterior putamen compatible with acute/early subacute infarction. No associated hemorrhage or mass effect. Multiple chronic lacunar infarctions are present within the bilateral basal ganglia involving thalamus, lentiform nuclei, and caudate nuclei. Multiple small chronic periventricular infarctions. Confluent patchynonspecific foci of T2 FLAIR hyperintense signal abnormality in subcortical and periventricular white matter as well as pons are compatible withseverechronic microvascular ischemic changes for age. Moderatebrain parenchymal volume loss. Numerous foci of susceptibility hypointensity are present in a predominantly central distribution involving basal ganglia, periventricular white matter, pons, and left cerebellar hemisphere compatible with hemosiderin deposition of chronic microhemorrhage. Vascular: Normal flow voids. Skull and upper cervical spine: Normal marrow signal. Sinuses/Orbits: Negative. Other: None. IMPRESSION: 1. 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 No associated hemorrhage or mass effect. 2. Several chronic lacunar infarcts in basal ganglia and periventricular white matter. 3. Severe for age chronic microvascular ischemic changes of the brain. Moderate brain parenchymal volume loss. 4. Foci of chronic microhemorrhage with central distribution, probably related to chronic hypertension. These results were called by telephone at the time of interpretation on 07/18/2017 at 2:44 am to Dr. Roxanne Mins, who verbally acknowledged these results. Electronically Signed   By: Kristine Garbe M.D.   On: 07/18/2017 01:46   US Renal  Result Date: 07/24/2017 CLINICAL DATA:  Acute renal failure, lupus. EXAM: RENAL / URINARY TRACT ULTRASOUND COMPLETE COMPARISON:  None. FINDINGS: Right Kidney: Length: 10.4 cm. Parenchymal echogenicity is increased. No hydronephrosis or mass. Left Kidney: Length: 9.8 cm. Parenchymal echogenicity is increased. No hydronephrosis or mass.  Bladder: Debris is seen dependently in the bladder. IMPRESSION: 1. Increased renal parenchymal echogenicity, consistent with chronic medical renal disease. 2. Dependent bladder debris. Electronically Signed   By: Lorin Picket M.D.   On: 07/24/2017 06:39   Mr Jodene Nam Head Wo Contrast  Result Date: 07/18/2017 CLINICAL DATA:  67 y/o  M; slurred speech and infarct. EXAM: MRA HEAD WITHOUT CONTRAST TECHNIQUE: Angiographic images of the Circle of Willis were obtained using MRA technique without intravenous contrast. COMPARISON:  07/18/2017 MRI of the head. FINDINGS: Mild motion artifact. Internal carotid arteries:  Patent. Anterior cerebral arteries:  Patent. Middle cerebral arteries: Patent. Anterior communicating artery: Not identified, likely hypoplastic or absent. Posterior communicating arteries:  Patent.  Bilateral fetal PCA. Posterior cerebral arteries:  Patent. Basilar artery:  Patent. Vertebral arteries:  Patent. No evidence of high-grade stenosis, large vessel occlusion, or aneurysm. IMPRESSION: Patent anterior and posterior intracranial circulation. No large vessel occlusion, aneurysm, or significant stenosis.  Electronically Signed   By: Kristine Garbe M.D.   On: 07/18/2017 06:03     The results of significant diagnostics from this hospitalization (including imaging, microbiology, ancillary and laboratory) are listed below for reference.     Microbiology: No results found for this or any previous visit (from the past 240 hour(s)).   Labs: BNP (last 3 results) No results for input(s): BNP in the last 8760 hours. Basic Metabolic Panel: Recent Labs  Lab 07/24/17 0341 07/24/17 1647 07/25/17 0623 07/26/17 0758  NA 136 138 140 139  K 3.9 3.6 3.8 3.8  CL 101 102 106 106  CO2 24 28 24 26   GLUCOSE 106* 103* 92 90  BUN 35* 28* 28* 20  CREATININE 2.91* 2.21* 1.87* 1.67*  CALCIUM 9.2 8.6* 8.2* 8.1*   Liver Function Tests: No results for input(s): AST, ALT, ALKPHOS, BILITOT, PROT,  ALBUMIN in the last 168 hours. No results for input(s): LIPASE, AMYLASE in the last 168 hours. No results for input(s): AMMONIA in the last 168 hours. CBC: Recent Labs  Lab 07/24/17 0341  WBC 4.4  NEUTROABS 3.0  HGB 12.9*  HCT 40.1  MCV 82.2  PLT 138*   Cardiac Enzymes: Recent Labs  Lab 07/24/17 0341 07/26/17 0758  CKTOTAL 503* 140   BNP: Invalid input(s): POCBNP CBG: Recent Labs  Lab 07/24/17 2128  GLUCAP 98   D-Dimer No results for input(s): DDIMER in the last 72 hours. Hgb A1c No results for input(s): HGBA1C in the last 72 hours. Lipid Profile No results for input(s): CHOL, HDL, LDLCALC, TRIG, CHOLHDL, LDLDIRECT in the last 72 hours. Thyroid function studies No results for input(s): TSH, T4TOTAL, T3FREE, THYROIDAB in the last 72 hours.  Invalid input(s): FREET3 Anemia work up No results for input(s): VITAMINB12, FOLATE, FERRITIN, TIBC, IRON, RETICCTPCT in the last 72 hours. Urinalysis    Component Value Date/Time   COLORURINE STRAW (A) 07/24/2017 0852   APPEARANCEUR CLEAR 07/24/2017 0852   LABSPEC 1.006 07/24/2017 0852   PHURINE 6.0 07/24/2017 0852   GLUCOSEU NEGATIVE 07/24/2017 0852   HGBUR SMALL (A) 07/24/2017 0852   BILIRUBINUR NEGATIVE 07/24/2017 0852   KETONESUR NEGATIVE 07/24/2017 0852   PROTEINUR NEGATIVE 07/24/2017 0852   NITRITE NEGATIVE 07/24/2017 0852   LEUKOCYTESUR NEGATIVE 07/24/2017 0852   Sepsis Labs Invalid input(s): PROCALCITONIN,  WBC,  LACTICIDVEN Microbiology No results found for this or any previous visit (from the past 240 hour(s)).   Time coordinating discharge: Over 30 minutes  SIGNED:   [name], MD  [facility] 07/26/2017, 12:50 PM Pager   If 7PM-7AM, please contact night-coverage [email or web address] Password [email or web address]

## 2017-07-26 NOTE — Clinical Social Work Note (Signed)
CSW received consult regarding patient needing assistance with housing as he has no heat at home. CSW visited with patient and discussion ensued regarding his situation. His furnance is leaking carbon dioxide and his mother is currently staying with family. Patient reported that he does not have the money at this point to have it repaired or replaced. Patient supplied with shelter resources and information on the [employer]. CSW signing off, please reconsult if any other SW interactive services needed prior to discharge.  Harry Shuck Givens, MSW, LCSW Licensed Clinical Social Worker [place] [phone]

## 2017-07-26 NOTE — Progress Notes (Signed)
Patient Discharge: Disposition: Patient discharged to home. Education: patient refused to go over the discharge instructions, or the medications, says he got it.   Telemetry: N/A Transportation: Patient refused to be escorted out and walked out of the unit saying his friend is waiting at the entrance. Belongings: Patient took all his belongings with him.

## 2017-08-08 ENCOUNTER — Telehealth: Payer: Self-pay | Admitting: Cardiology

## 2017-08-08 ENCOUNTER — Encounter: Payer: Self-pay | Admitting: Cardiology

## 2017-08-08 NOTE — Telephone Encounter (Signed)
Closed Encounter  °

## 2017-08-27 ENCOUNTER — Telehealth: Payer: Self-pay | Admitting: Internal Medicine

## 2017-08-28 NOTE — Progress Notes (Deleted)
Jamesetta Orleans MD Reason for referral-Abnormal echocardiogram  HPI: 67 year old male for evaluation of abnormal echocardiogram at request of [name] MD. Seen previously by Dr. Debara Pickett but not since October 2014.  Nuclear study November 2014 showed ejection fraction 39% and diaphragmatic attenuation.  No ischemia.  Echocardiogram February 2019 showed normal LV function, mild diastolic dysfunction, mild aortic insufficiency and mild biatrial enlargement.  Myocardium felt suggestive of amyloid and cardiology asked to evaluate.  Recently admitted with CVA and acute renal failure.  There was some improvement with hydration.  Current Outpatient Medications  Medication Sig Dispense Refill  . amLODipine (NORVASC) 10 MG tablet Take 1 tablet (10 mg total) by mouth daily. 30 tablet 0  . aspirin 325 MG tablet Take 1 tablet (325 mg total) by mouth daily. 30 tablet 0  . atorvastatin (LIPITOR) 80 MG tablet Take 1 tablet (80 mg total) by mouth daily at 6 PM. (Patient not taking: Reported on 07/24/2017) 30 tablet 0  . Multiple Vitamin (MULTIVITAMIN WITH MINERALS) TABS tablet Take 1 tablet by mouth daily.    Marland Kitchen thiamine 100 MG tablet Take 1 tablet (100 mg total) by mouth daily. 30 tablet 0   No current facility-administered medications for this visit.     No Known Allergies  Past Medical History:  Diagnosis Date  . Acute CVA (cerebrovascular accident) ([place]) 06/2017   "speech issues since" (07/24/2017)  . AKI (acute kidney injury) ([place]) 07/24/2017  . Anemia   . High cholesterol   . Lupus    "skin kind"  . NSVT (nonsustained ventricular tachycardia) ([facility])     Past Surgical History:  Procedure Laterality Date  . KNEE ARTHROSCOPY    . TONSILLECTOMY      Social History   Socioeconomic History  . Marital status: Divorced    Spouse name: Not on file  . Number of children: 2  . Years of education: Not on file  . Highest education level: Not on file  Occupational History  . Not on file    Social Needs  . Financial resource strain: Not on file  . Food insecurity:    Worry: Not on file    Inability: Not on file  . Transportation needs:    Medical: Not on file    Non-medical: Not on file  Tobacco Use  . Smoking status: Current Every Day Smoker    Packs/day: 0.12    Years: 45.00    Pack years: 5.40    Types: Cigars, Cigarettes  . Smokeless tobacco: Never Used  Substance and Sexual Activity  . Alcohol use: Yes    Alcohol/week: 1.8 oz    Types: 3 Standard drinks or equivalent per week  . Drug use: No  . Sexual activity: Never  Lifestyle  . Physical activity:    Days per week: Not on file    Minutes per session: Not on file  . Stress: Not on file  Relationships  . Social connections:    Talks on phone: Not on file    Gets together: Not on file    Attends religious service: Not on file    Active member of club or organization: Not on file    Attends meetings of clubs or organizations: Not on file    Relationship status: Not on file  . Intimate partner violence:    Fear of current or ex partner: Not on file    Emotionally abused: Not on file    Physically abused: Not on file  Forced sexual activity: Not on file  Other Topics Concern  . Not on file  Social History Narrative  . Not on file    Family History  Problem Relation Age of Onset  . Heart attack Father     ROS: no fevers or chills, productive cough, hemoptysis, dysphasia, odynophagia, melena, hematochezia, dysuria, hematuria, rash, seizure activity, orthopnea, PND, pedal edema, claudication. Remaining systems are negative.  Physical Exam:   There were no vitals taken for this visit.  General:  Well developed/well nourished in NAD Skin warm/dry Patient not depressed No peripheral clubbing Back-normal HEENT-normal/normal eyelids Neck supple/normal carotid upstroke bilaterally; no bruits; no JVD; no thyromegaly chest - CTA/ normal expansion CV - RRR/normal S1 and S2; no murmurs, rubs or  gallops;  PMI nondisplaced Abdomen -NT/ND, no HSM, no mass, + bowel sounds, no bruit 2+ femoral pulses, no bruits Ext-no edema, chords, 2+ DP Neuro-grossly nonfocal  ECG - personally reviewed  A/P  1  Kirk Ruths, MD

## 2017-09-09 NOTE — Telephone Encounter (Signed)
F/u patient should be r/s for 09-11-17 with Dr. Debara Pickett.

## 2017-09-09 NOTE — Telephone Encounter (Signed)
Called to move patient to Dr. Debara Pickett schedule for 09-11-17 @11 :15. I called all three numbers and was unable to leave message.

## 2017-09-10 ENCOUNTER — Ambulatory Visit: Payer: Medicare Other | Admitting: Cardiology

## 2017-09-11 ENCOUNTER — Encounter: Payer: Self-pay | Admitting: *Deleted

## 2017-09-16 ENCOUNTER — Encounter: Payer: Self-pay | Admitting: Pediatric Intensive Care

## 2017-09-16 NOTE — Congregational Nurse Program (Signed)
Congregational Nurse Program Note  Date of Encounter: 09/16/2017  Past Medical History: Past Medical History:  Diagnosis Date  . Acute CVA (cerebrovascular accident) ([place]) 06/2017   "speech issues since" (07/24/2017)  . AKI (acute kidney injury) ([place]) 07/24/2017  . Anemia   . High cholesterol   . Lupus    "skin kind"  . NSVT (nonsustained ventricular tachycardia) ([place])     Encounter Details: CNP Questionnaire - 09/16/17 0915      Questionnaire   Patient Status  Not Applicable    Race  Black or African American    Location Patient [place]  Medicare    Uninsured  Not Applicable    Food  No food insecurities    Housing/Utilities  No permanent housing    Transportation  No transportation needs    Interpersonal Safety  No, do not feel physically and emotionally safe where you currently live    Medication  No medication insecurities    Medical Provider  Yes    Referrals  Not Applicable    ED Visit Averted  Not Applicable    Life-Saving Intervention Made  Not Applicable     Client states he had a stroke about a month ago and would like to get his BP checked. 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 Client states he has only been taking a 1/2 tablet of his BP medication. CN reviewed medication from hospital discharge and discussed importance of taking medication by direction.Cleint states he will take medication as directed and follow up in CN clinic for BP check as needed. CN also advised follow up with PCP.

## 2017-09-24 DIAGNOSIS — Z8673 Personal history of transient ischemic attack (TIA), and cerebral infarction without residual deficits: Secondary | ICD-10-CM | POA: Diagnosis not present

## 2017-09-24 DIAGNOSIS — I10 Essential (primary) hypertension: Secondary | ICD-10-CM | POA: Diagnosis not present

## 2017-09-24 DIAGNOSIS — R5383 Other fatigue: Secondary | ICD-10-CM | POA: Diagnosis not present

## 2017-09-24 DIAGNOSIS — Z87891 Personal history of nicotine dependence: Secondary | ICD-10-CM | POA: Diagnosis not present

## 2017-09-24 DIAGNOSIS — E7800 Pure hypercholesterolemia, unspecified: Secondary | ICD-10-CM | POA: Diagnosis not present

## 2017-09-24 DIAGNOSIS — Z87448 Personal history of other diseases of urinary system: Secondary | ICD-10-CM | POA: Diagnosis not present

## 2017-09-24 DIAGNOSIS — F1011 Alcohol abuse, in remission: Secondary | ICD-10-CM | POA: Diagnosis not present

## 2017-09-30 ENCOUNTER — Emergency Department (HOSPITAL_COMMUNITY)
Admission: EM | Admit: 2017-09-30 | Discharge: 2017-09-30 | Disposition: A | Discharge status: Home / Self Care | Payer: Medicare Other | Attending: Emergency Medicine | Admitting: Emergency Medicine

## 2017-09-30 ENCOUNTER — Encounter (HOSPITAL_COMMUNITY): Payer: Self-pay

## 2017-09-30 ENCOUNTER — Other Ambulatory Visit: Payer: Self-pay

## 2017-09-30 DIAGNOSIS — Z8673 Personal history of transient ischemic attack (TIA), and cerebral infarction without residual deficits: Secondary | ICD-10-CM | POA: Insufficient documentation

## 2017-09-30 DIAGNOSIS — R404 Transient alteration of awareness: Secondary | ICD-10-CM | POA: Diagnosis not present

## 2017-09-30 DIAGNOSIS — R42 Dizziness and giddiness: Secondary | ICD-10-CM | POA: Diagnosis not present

## 2017-09-30 DIAGNOSIS — I129 Hypertensive chronic kidney disease with stage 1 through stage 4 chronic kidney disease, or unspecified chronic kidney disease: Secondary | ICD-10-CM | POA: Insufficient documentation

## 2017-09-30 DIAGNOSIS — Z79899 Other long term (current) drug therapy: Secondary | ICD-10-CM | POA: Diagnosis not present

## 2017-09-30 DIAGNOSIS — F17210 Nicotine dependence, cigarettes, uncomplicated: Secondary | ICD-10-CM | POA: Diagnosis not present

## 2017-09-30 DIAGNOSIS — N189 Chronic kidney disease, unspecified: Secondary | ICD-10-CM | POA: Diagnosis not present

## 2017-09-30 DIAGNOSIS — R0981 Nasal congestion: Secondary | ICD-10-CM | POA: Diagnosis not present

## 2017-09-30 DIAGNOSIS — Z7982 Long term (current) use of aspirin: Secondary | ICD-10-CM | POA: Diagnosis not present

## 2017-09-30 LAB — CBC
HCT: 37.8 % — ABNORMAL LOW (ref 39.0–52.0)
Hemoglobin: 11.8 g/dL — ABNORMAL LOW (ref 13.0–17.0)
MCH: 25.9 pg — ABNORMAL LOW (ref 26.0–34.0)
MCHC: 31.2 g/dL (ref 30.0–36.0)
MCV: 83.1 fL (ref 78.0–100.0)
PLATELETS: 165 10*3/uL (ref 150–400)
RBC: 4.55 MIL/uL (ref 4.22–5.81)
RDW: 15.9 % — AB (ref 11.5–15.5)
WBC: 6.5 10*3/uL (ref 4.0–10.5)

## 2017-09-30 LAB — URINALYSIS, ROUTINE W REFLEX MICROSCOPIC
Bilirubin Urine: NEGATIVE
GLUCOSE, UA: NEGATIVE mg/dL
Hgb urine dipstick: NEGATIVE
KETONES UR: NEGATIVE mg/dL
LEUKOCYTES UA: NEGATIVE
Nitrite: NEGATIVE
PROTEIN: NEGATIVE mg/dL
Specific Gravity, Urine: 1.016 (ref 1.005–1.030)
pH: 6 (ref 5.0–8.0)

## 2017-09-30 LAB — BASIC METABOLIC PANEL
Anion gap: 6 (ref 5–15)
BUN: 29 mg/dL — AB (ref 6–20)
CHLORIDE: 101 mmol/L (ref 101–111)
CO2: 30 mmol/L (ref 22–32)
CREATININE: 1.51 mg/dL — AB (ref 0.61–1.24)
Calcium: 8.8 mg/dL — ABNORMAL LOW (ref 8.9–10.3)
GFR, EST AFRICAN AMERICAN: 53 mL/min — AB (ref >60)
GFR, EST NON AFRICAN AMERICAN: 46 mL/min — AB (ref >60)
Glucose, Bld: 88 mg/dL (ref 65–99)
POTASSIUM: 4.1 mmol/L (ref 3.5–5.1)
SODIUM: 137 mmol/L (ref 135–145)

## 2017-09-30 NOTE — ED Notes (Signed)
Patient given discharge instructions and verbalized understanding.  Patient stable to discharge at this time.  Patient is alert and oriented to baseline.  No distressed noted at this time.  All belongings taken with the patient at discharge.   

## 2017-09-30 NOTE — ED Notes (Signed)
Pt has been ambulatory in waiting room. Pt has asked several times about the wait time.

## 2017-09-30 NOTE — ED Provider Notes (Signed)
[facility] EMERGENCY DEPARTMENT Provider Note   CSN: [id number] Arrival date & time: 09/30/17  1002     History   Chief Complaint Chief Complaint  Patient presents with  . Dizziness    HPI Isaac Reyes is a 67 y.o. male.  Patient with history of stroke, high blood pressure --presents with complaint of dizziness.  Patient is non-to prescription when talking about this but does state that he has mild sensation of spinning.  He denies lightheadedness or syncope.  He denies imbalance or trouble walking.  Patient denies signs of stroke including: facial droop, slurred speech, aphasia, weakness/numbness in extremities, imbalance/trouble walking.  No headaches or neck pain.  No fevers.  He does have some sinus congestion.  Patient relates the dizzy spells to occurring after starting taking amlodipine.  He is currently on 10 mg daily.  He did take his medication this morning. The onset of this condition was acute. The course is waxing and waning. Alleviating factors: none.        Past Medical History:  Diagnosis Date  . Acute CVA (cerebrovascular accident) ([place]) 06/2017   "speech issues since" (07/24/2017)  . AKI (acute kidney injury) ([place]) 07/24/2017  . Anemia   . High cholesterol   . Lupus ([place])    "skin kind"  . NSVT (nonsustained ventricular tachycardia) [facility])     Patient Active Problem List   Diagnosis Date Noted  . Benign essential HTN 07/26/2017  . Acute kidney failure ([place]) 07/24/2017  . CVA (cerebral vascular accident) ([place]) 07/19/2017  . Stroke (cerebrum) ([place]) 07/18/2017  . NSVT (nonsustained ventricular tachycardia) ([place]) 07/18/2017  . Elevated blood pressure reading 07/18/2017  . Normocytic normochromic anemia 07/18/2017  . Malnutrition of moderate degree 07/18/2017  . Normocytic anemia   . Frequent falls   . Renal insufficiency   . Hyperlipidemia   . Heavy smoker   . Alcohol abuse   . Abnormal EKG 03/19/2013  . Pre-operative  cardiovascular exam, new EKG abnormalities c/w ischemia 03/19/2013  . LVH (left ventricular hypertrophy) 03/19/2013  . Murmur, cardiac 03/19/2013    Past Surgical History:  Procedure Laterality Date  . KNEE ARTHROSCOPY    . TONSILLECTOMY          Home Medications    Prior to Admission medications   Medication Sig Start Date End Date Taking? Authorizing Provider  amLODipine (NORVASC) 10 MG tablet Take 1 tablet (10 mg total) by mouth daily. 07/27/17   [name], MD  aspirin 325 MG tablet Take 1 tablet (325 mg total) by mouth daily. 07/20/17   [name], DO  atorvastatin (LIPITOR) 80 MG tablet Take 1 tablet (80 mg total) by mouth daily at 6 PM. Patient not taking: Reported on 07/24/2017 07/19/17   [name], DO  Multiple Vitamin (MULTIVITAMIN WITH MINERALS) TABS tablet Take 1 tablet by mouth daily.    [provider]  thiamine 100 MG tablet Take 1 tablet (100 mg total) by mouth daily. 07/27/17   [name], MD    Family History Family History  Problem Relation Age of Onset  . Heart attack Father     Social History Social History   Tobacco Use  . Smoking status: Current Every Day Smoker    Packs/day: 0.12    Years: 45.00    Pack years: 5.40    Types: Cigars, Cigarettes  . Smokeless tobacco: Never Used  Substance Use Topics  . Alcohol use: Yes    Alcohol/week: 1.8 oz  Types: 3 Standard drinks or equivalent per week  . Drug use: No     Allergies   Patient has no known allergies.   Review of Systems Review of Systems  Constitutional: Negative for fever.  HENT: Positive for congestion. Negative for dental problem, rhinorrhea and sinus pressure.   Eyes: Negative for photophobia, discharge, redness and visual disturbance.  Respiratory: Negative for shortness of breath.   Cardiovascular: Negative for chest pain.  Gastrointestinal: Negative for nausea and vomiting.  Musculoskeletal: Negative for gait problem, neck pain and neck stiffness.  Skin:  Negative for rash.  Neurological: Positive for dizziness. Negative for syncope, speech difficulty, weakness, light-headedness, numbness and headaches.  Psychiatric/Behavioral: Negative for confusion.     Physical Exam Updated Vital Signs BP (!) 165/114 (BP Location: Right Arm)   Pulse 77   Temp 98.1 F (36.7 C) (Oral)   Resp 16   SpO2 100%   Physical Exam  Constitutional: He is oriented to person, place, and time. He appears well-developed and well-nourished.  HENT:  Head: Normocephalic and atraumatic.  Right Ear: Tympanic membrane, external ear and ear canal normal.  Left Ear: Tympanic membrane, external ear and ear canal normal.  Nose: Nose normal.  Mouth/Throat: Uvula is midline, oropharynx is clear and moist and mucous membranes are normal.  Eyes: Pupils are equal, round, and reactive to light. Conjunctivae, EOM and lids are normal.  Neck: Normal range of motion. Neck supple.  Cardiovascular: Normal rate and regular rhythm.  Pulmonary/Chest: Effort normal and breath sounds normal.  Abdominal: Soft. There is no tenderness.  Musculoskeletal: Normal range of motion.       Cervical back: He exhibits normal range of motion, no tenderness and no bony tenderness.  Neurological: He is alert and oriented to person, place, and time. He has normal strength and normal reflexes. No cranial nerve deficit or sensory deficit. He exhibits normal muscle tone. He displays a negative Romberg sign. Coordination and gait normal. GCS eye subscore is 4. GCS verbal subscore is 5. GCS motor subscore is 6.  Patient with normal ambulation. He stands up from sitting without any difficulty.   Skin: Skin is warm and dry.  Psychiatric: He has a normal mood and affect.  Nursing note and vitals reviewed.    ED Treatments / Results  Labs (all labs ordered are listed, but only abnormal results are displayed) Labs Reviewed  BASIC METABOLIC PANEL - Abnormal; Notable for the following components:      Result  Value   BUN 29 (*)    Creatinine, Ser 1.51 (*)    Calcium 8.8 (*)    GFR calc non Af Amer 46 (*)    GFR calc Af Amer 53 (*)    All other components within normal limits  CBC - Abnormal; Notable for the following components:   Hemoglobin 11.8 (*)    HCT 37.8 (*)    MCH 25.9 (*)    RDW 15.9 (*)    All other components within normal limits  URINALYSIS, ROUTINE W REFLEX MICROSCOPIC  CBG MONITORING, ED    EKG EKG Interpretation  Date/Time:  Monday Sep 30 2017 10:15:39 EDT Ventricular Rate:  78 PR Interval:  160 QRS Duration: 100 QT Interval:  396 QTC Calculation: 451 R Axis:   -38 Text Interpretation:  Normal sinus rhythm with sinus arrhythmia Left axis deviation Left ventricular hypertrophy T wave abnormality, consider lateral ischemia Abnormal ECG similar to prior ecg 2/19 Confirmed by Aletta Edouard [phone]) on 09/30/2017 4:53:37 PM  Radiology No results found.  Procedures Procedures (including critical care time)  Medications Ordered in ED Medications - No data to display   Initial Impression / Assessment and Plan / ED Course  I have reviewed the triage vital signs and the nursing notes.  Pertinent labs & imaging results that were available during my care of the patient were reviewed by me and considered in my medical decision making (see chart for details).  Clinical Course as of Sep 30 1725  Mon Sep 30, 1553  [id number] 67 year old male with history of hypertension here with some lightheadedness.  Symptoms have improved since arrival and is got a benign exam.  Labs are also at baseline.  He can follow-up with his primary care doctor for further work-up.   [MB]    Clinical Course User Index [MB] Hayden Rasmussen, MD    Patient seen and examined. EKG reviewed. Labs are at patient's baseline. Discussed with Dr. Melina Copa who will see.   Vital signs reviewed and are as follows: BP (!) 165/114 (BP Location: Right Arm)   Pulse 77   Temp 98.1 F (36.7 C) (Oral)   Resp  16   SpO2 100%   5:29 PM Dr. Melina Copa has seen patient.  At this point, we would not perform any imaging as symptoms are minor.  They may be related to blood pressure medications.  Patient encouraged to follow-up with his primary care doctor regarding his medications.  Discussed that his body may adjust over time to these medications and he may feel better.  We did discuss signs and symptoms of stroke and that he needs to call the ambulance with any worsening symptoms, dizziness so bad that he cannot walk. Patient also counseled to return if they have weakness in their arms or legs, slurred speech, trouble walking or talking, confusion, trouble with their balance, or if they have any other concerns. Patient verbalizes understanding and agrees with plan.    Final Clinical Impressions(s) / ED Diagnoses   Final diagnoses:  Dizziness   Patient with previous history of stroke presents with nondescript dizzy symptoms since starting a blood pressure medication, amlodipine.  Blood pressure is not low while patient has been in the emergency department.  He does not have any lightheadedness or syncope, chest pain or any other cardiac symptoms.  He has a normal neurological exam and can ambulate without any difficulty here in the department.  Low concern for TIA or stroke at this point.  Given minimal symptoms, do not feel that imaging with MRI is indicated at this time to rule out posterior circulation stroke, however patient counseled to return with worsening symptoms or other strokelike symptoms as above.  ED Discharge Orders    None       Carlisle Cater, Hershal Coria 09/30/17 1731    Hayden Rasmussen, MD 10/01/17 1141

## 2017-09-30 NOTE — Discharge Instructions (Signed)
Please read and follow all provided instructions.  Your diagnoses today include:  1. Dizziness    Tests performed today include:  Blood counts and electrolytes  Urine test  Vital signs. See below for your results today.   Medications prescribed:   None  Take any prescribed medications only as directed.  Home care instructions:  Follow any educational materials contained in this packet.  BE VERY CAREFUL not to take multiple medicines containing Tylenol (also called acetaminophen). Doing so can lead to an overdose which can damage your liver and cause liver failure and possibly death.   Follow-up instructions: Please follow-up with your primary care provider in the next 3 days for further evaluation of your symptoms and to discuss your blood pressure medications.   Return instructions:   Please return to the Emergency Department if you experience worsening symptoms.   Return if you have weakness in your arms or legs, slurred speech, trouble walking or talking, confusion, or trouble with your balance.   Please return if you have any other emergent concerns.  Additional Information:  Your vital signs today were: BP (!) 165/114 (BP Location: Right Arm)    Pulse 77    Temp 98.1 F (36.7 C) (Oral)    Resp 16    SpO2 100%  If your blood pressure (BP) was elevated above 135/85 this visit, please have this repeated by your doctor within one month. --------------

## 2017-09-30 NOTE — ED Triage Notes (Signed)
[facility]- pt coming from home with complaint of dizziness that has been going on X2 days. Pt had a change in BP medication and began having dizziness. Pt alert and oriented in triage.   140/76 HR 72 98% ra  cbg 117

## 2017-10-01 ENCOUNTER — Encounter: Payer: Self-pay | Admitting: Pediatric Intensive Care

## 2017-10-01 NOTE — Congregational Nurse Program (Signed)
Congregational Nurse Program Note  Date of Encounter: 10/01/2017  Past Medical History: Past Medical History:  Diagnosis Date  . Acute CVA (cerebrovascular accident) ([place]) 06/2017   "speech issues since" (07/24/2017)  . AKI (acute kidney injury) ([place]) 07/24/2017  . Anemia   . High cholesterol   . Lupus ([place])    "skin kind"  . NSVT (nonsustained ventricular tachycardia) ([place])     Encounter Details: CNP Questionnaire - 10/01/17 0850      Questionnaire   Patient Status  Not Applicable    Race  Black or African American    Location Patient [place]  Medicare    Uninsured  Not Applicable    Food  No food insecurities    Housing/Utilities  No permanent housing    Transportation  Yes, need transportation assistance    Interpersonal Safety  Yes, feel physically and emotionally safe where you currently live    Medication  Yes, have medication insecurities    Medical Provider  Yes    Referrals  Primary Care Provider/Clinic    ED Visit Averted  Not Applicable    Life-Saving Intervention Made  Not Applicable     Client states that he went to hospital yesterday due to "dizziness". BP check- client has taken his medication. Client states that he continues to have nocturnal urinary frequency. Cn assisted making appointment with PCP for Thursday at 1115. CN will provide transportation support.

## 2017-10-02 NOTE — Congregational Nurse Program (Signed)
Congregational Nurse Program Note  Date of Encounter: 09/27/2017  Past Medical History: Past Medical History:  Diagnosis Date  . Acute CVA (cerebrovascular accident) ([place]) 06/2017   "speech issues since" (07/24/2017)  . AKI (acute kidney injury) ([place]) 07/24/2017  . Anemia   . High cholesterol   . Lupus ([place])    "skin kind"  . NSVT (nonsustained ventricular tachycardia) ([place])     Encounter Details: CNP Questionnaire - 10/01/17 0850      Questionnaire   Patient Status  Not Applicable    Race  Black or African American    Location Patient [place]  Medicare    Uninsured  Not Applicable    Food  No food insecurities    Housing/Utilities  No permanent housing    Transportation  Yes, need transportation assistance    Interpersonal Safety  Yes, feel physically and emotionally safe where you currently live    Medication  Yes, have medication insecurities    Medical Provider  Yes    Referrals  Primary Care Provider/Clinic    ED Visit Averted  Not Applicable    Life-Saving Intervention Made  Not Applicable      Safety check with client per request of CN Lisette Abu.  Client is currently homeless and there were no beds at the shelter. Client does have financial resources to obtain housing when his check arrives in June.  Connected client with Emergency Services at [facility] to assist with emergency housing.  Gained approval from the Director of [employer] (shelter) for client to remain in the lobby until a bed is available

## 2017-10-03 DIAGNOSIS — I10 Essential (primary) hypertension: Secondary | ICD-10-CM | POA: Diagnosis not present

## 2017-10-03 DIAGNOSIS — R350 Frequency of micturition: Secondary | ICD-10-CM | POA: Diagnosis not present

## 2017-10-03 DIAGNOSIS — N400 Enlarged prostate without lower urinary tract symptoms: Secondary | ICD-10-CM | POA: Diagnosis not present

## 2017-10-11 ENCOUNTER — Encounter: Payer: Self-pay | Admitting: Pediatric Intensive Care

## 2017-10-17 ENCOUNTER — Ambulatory Visit: Payer: Medicare Other | Admitting: Internal Medicine

## 2017-10-17 NOTE — Congregational Nurse Program (Signed)
Congregational Nurse Program Note  Date of Encounter: 10/11/2017  Past Medical History: Past Medical History:  Diagnosis Date  . Acute CVA (cerebrovascular accident) ([place]) 06/2017   "speech issues since" (07/24/2017)  . AKI (acute kidney injury) ([place]) 07/24/2017  . Anemia   . High cholesterol   . Lupus ([place])    "skin kind"  . NSVT (nonsustained ventricular tachycardia) ([place])     Encounter Details: CNP Questionnaire - 10/11/17 1015      Questionnaire   Patient Status  Not Applicable    Race  Black or African American    Location Patient [place]  Medicare    Uninsured  Not Applicable    Food  No food insecurities    Housing/Utilities  No permanent housing    Transportation  Yes, need transportation assistance    Interpersonal Safety  Yes, feel physically and emotionally safe where you currently live    Medication  Yes, have medication insecurities    Medical Provider  Yes    Referrals  Not Applicable    ED Visit Averted  Not Applicable    Life-Saving Intervention Made  Not Applicable     BP check.

## 2017-10-18 ENCOUNTER — Encounter: Payer: Self-pay | Admitting: Pediatric Intensive Care

## 2017-10-18 NOTE — Congregational Nurse Program (Signed)
Congregational Nurse Program Note  Date of Encounter: 10/18/2017  Past Medical History: Past Medical History:  Diagnosis Date  . Acute CVA (cerebrovascular accident) ([place]) 06/2017   "speech issues since" (07/24/2017)  . AKI (acute kidney injury) ([place]) 07/24/2017  . Anemia   . High cholesterol   . Lupus ([place])    "skin kind"  . NSVT (nonsustained ventricular tachycardia) ([place])     Encounter Details: CNP Questionnaire - 10/18/17 0900      Questionnaire   Patient Status  Not Applicable    Race  Black or African American    Location Patient [place]  Medicare    Uninsured  Not Applicable    Food  No food insecurities    Housing/Utilities  No permanent housing    Transportation  Yes, need transportation assistance    Interpersonal Safety  Yes, feel physically and emotionally safe where you currently live    Medication  Yes, have medication insecurities    Medical Provider  Yes    Referrals  Other    ED Visit Averted  Not Applicable    Life-Saving Intervention Made  Not Applicable     BP check. Rescheduled cardiology appointment. Confirmed PCP appointment. Reminder cards given. Re-check BP on Monday. Client states that living at shelter has become very stressful as he feels people in the community are intrusive and without "morals". CN encouraged client to verbalize feelings/emotions and to speak with [facility] Nan nurse. Client to return to CN clinic on MOnday for BP check and bus passes.

## 2017-10-21 ENCOUNTER — Encounter: Payer: Self-pay | Admitting: Pediatric Intensive Care

## 2017-10-21 ENCOUNTER — Ambulatory Visit: Payer: Medicare Other | Admitting: Internal Medicine

## 2017-10-21 NOTE — Congregational Nurse Program (Signed)
Congregational Nurse Program Note  Date of Encounter: 10/21/2017  Past Medical History: Past Medical History:  Diagnosis Date  . Acute CVA (cerebrovascular accident) ([place]) 06/2017   "speech issues since" (07/24/2017)  . AKI (acute kidney injury) ([place]) 07/24/2017  . Anemia   . High cholesterol   . Lupus ([place])    "skin kind"  . NSVT (nonsustained ventricular tachycardia) ([place])     Encounter Details: CNP Questionnaire - 10/21/17 0900      Questionnaire   Patient Status  Not Applicable    Race  Black or African American    Location Patient [place]  Medicare    Uninsured  Not Applicable    Food  No food insecurities    Housing/Utilities  No permanent housing    Transportation  Provided transportation assistance (bus pass, taxi voucher, etc.);Yes, need transportation assistance    Interpersonal Safety  No, do not feel physically and emotionally safe where you currently live    Medication  Yes, have medication insecurities    Medical Provider  Yes    Referrals  Other;Primary Care Provider/Clinic    ED Visit Averted  Not Applicable    Life-Saving Intervention Made  Not Applicable     BP check. CN verified cardiology appointment for this afternoon. HR 88 but very irregular. Precordium active. Client notes edema at ankles- CN notes 2+ edema of ankles which is not client baseline. Client endorses increased stress and inability to cope with environment at shelter. Bus passes with directions to cardiology office given.

## 2017-10-22 ENCOUNTER — Encounter: Payer: Self-pay | Admitting: Internal Medicine

## 2017-10-22 ENCOUNTER — Encounter: Payer: Self-pay | Admitting: Pediatric Intensive Care

## 2017-10-22 DIAGNOSIS — N400 Enlarged prostate without lower urinary tract symptoms: Secondary | ICD-10-CM | POA: Diagnosis not present

## 2017-10-22 DIAGNOSIS — I129 Hypertensive chronic kidney disease with stage 1 through stage 4 chronic kidney disease, or unspecified chronic kidney disease: Secondary | ICD-10-CM | POA: Diagnosis not present

## 2017-10-22 DIAGNOSIS — I10 Essential (primary) hypertension: Secondary | ICD-10-CM | POA: Diagnosis not present

## 2017-10-22 DIAGNOSIS — N183 Chronic kidney disease, stage 3 (moderate): Secondary | ICD-10-CM | POA: Diagnosis not present

## 2017-10-22 DIAGNOSIS — Z8673 Personal history of transient ischemic attack (TIA), and cerebral infarction without residual deficits: Secondary | ICD-10-CM | POA: Diagnosis not present

## 2017-10-24 ENCOUNTER — Encounter: Payer: Self-pay | Admitting: Pediatric Intensive Care

## 2017-10-24 NOTE — Congregational Nurse Program (Signed)
Congregational Nurse Program Note  Date of Encounter: 10/24/2017  Past Medical History: Past Medical History:  Diagnosis Date  . Acute CVA (cerebrovascular accident) ([place]) 06/2017   "speech issues since" (07/24/2017)  . AKI (acute kidney injury) ([place]) 07/24/2017  . Anemia   . High cholesterol   . Lupus ([place])    "skin kind"  . NSVT (nonsustained ventricular tachycardia) ([place])     Encounter Details: CNP Questionnaire - 10/21/17 0900      Questionnaire   Patient Status  Not Applicable    Race  Black or African American    Location Patient [place]  Medicare    Uninsured  Not Applicable    Food  No food insecurities    Housing/Utilities  No permanent housing    Transportation  Provided transportation assistance (bus pass, taxi voucher, etc.);Yes, need transportation assistance    Interpersonal Safety  No, do not feel physically and emotionally safe where you currently live    Medication  Yes, have medication insecurities    Medical Provider  Yes    Referrals  Other;Primary Care Provider/Clinic    ED Visit Averted  Not Applicable    Life-Saving Intervention Made  Not Applicable      BP check. Client has appointment with PCP on 5/7. CN will provide transportation assistance.

## 2017-11-04 ENCOUNTER — Encounter: Payer: Self-pay | Admitting: Pediatric Intensive Care

## 2017-11-05 ENCOUNTER — Encounter: Payer: Self-pay | Admitting: Pediatric Intensive Care

## 2017-11-06 DIAGNOSIS — N400 Enlarged prostate without lower urinary tract symptoms: Secondary | ICD-10-CM | POA: Diagnosis not present

## 2017-11-06 DIAGNOSIS — R319 Hematuria, unspecified: Secondary | ICD-10-CM | POA: Diagnosis not present

## 2017-11-07 ENCOUNTER — Encounter (HOSPITAL_COMMUNITY): Payer: Self-pay | Admitting: Emergency Medicine

## 2017-11-07 ENCOUNTER — Emergency Department (HOSPITAL_COMMUNITY): Payer: Medicare Other

## 2017-11-07 ENCOUNTER — Other Ambulatory Visit: Payer: Self-pay

## 2017-11-07 ENCOUNTER — Inpatient Hospital Stay (HOSPITAL_COMMUNITY)
Admission: EM | Admit: 2017-11-07 | Discharge: 2017-11-09 | DRG: 312 | Disposition: A | Discharge status: Home / Self Care | Payer: Medicare Other | Attending: Internal Medicine | Admitting: Internal Medicine

## 2017-11-07 DIAGNOSIS — R569 Unspecified convulsions: Secondary | ICD-10-CM | POA: Diagnosis present

## 2017-11-07 DIAGNOSIS — I5032 Chronic diastolic (congestive) heart failure: Secondary | ICD-10-CM | POA: Diagnosis present

## 2017-11-07 DIAGNOSIS — I10 Essential (primary) hypertension: Secondary | ICD-10-CM | POA: Diagnosis not present

## 2017-11-07 DIAGNOSIS — R9431 Abnormal electrocardiogram [ECG] [EKG]: Secondary | ICD-10-CM | POA: Diagnosis present

## 2017-11-07 DIAGNOSIS — F1010 Alcohol abuse, uncomplicated: Secondary | ICD-10-CM | POA: Diagnosis present

## 2017-11-07 DIAGNOSIS — E785 Hyperlipidemia, unspecified: Secondary | ICD-10-CM | POA: Diagnosis present

## 2017-11-07 DIAGNOSIS — R531 Weakness: Secondary | ICD-10-CM

## 2017-11-07 DIAGNOSIS — R002 Palpitations: Secondary | ICD-10-CM | POA: Diagnosis present

## 2017-11-07 DIAGNOSIS — R509 Fever, unspecified: Secondary | ICD-10-CM | POA: Diagnosis not present

## 2017-11-07 DIAGNOSIS — Z8249 Family history of ischemic heart disease and other diseases of the circulatory system: Secondary | ICD-10-CM

## 2017-11-07 DIAGNOSIS — Z6824 Body mass index (BMI) 24.0-24.9, adult: Secondary | ICD-10-CM

## 2017-11-07 DIAGNOSIS — I639 Cerebral infarction, unspecified: Secondary | ICD-10-CM | POA: Diagnosis not present

## 2017-11-07 DIAGNOSIS — N289 Disorder of kidney and ureter, unspecified: Secondary | ICD-10-CM

## 2017-11-07 DIAGNOSIS — D649 Anemia, unspecified: Secondary | ICD-10-CM | POA: Diagnosis present

## 2017-11-07 DIAGNOSIS — I951 Orthostatic hypotension: Secondary | ICD-10-CM | POA: Diagnosis present

## 2017-11-07 DIAGNOSIS — I4729 Other ventricular tachycardia: Secondary | ICD-10-CM

## 2017-11-07 DIAGNOSIS — F17210 Nicotine dependence, cigarettes, uncomplicated: Secondary | ICD-10-CM | POA: Diagnosis present

## 2017-11-07 DIAGNOSIS — R55 Syncope and collapse: Principal | ICD-10-CM | POA: Diagnosis present

## 2017-11-07 DIAGNOSIS — Z66 Do not resuscitate: Secondary | ICD-10-CM | POA: Diagnosis present

## 2017-11-07 DIAGNOSIS — R4020 Unspecified coma: Secondary | ICD-10-CM | POA: Diagnosis not present

## 2017-11-07 DIAGNOSIS — R296 Repeated falls: Secondary | ICD-10-CM

## 2017-11-07 DIAGNOSIS — F17290 Nicotine dependence, other tobacco product, uncomplicated: Secondary | ICD-10-CM | POA: Diagnosis present

## 2017-11-07 DIAGNOSIS — L80 Vitiligo: Secondary | ICD-10-CM | POA: Diagnosis present

## 2017-11-07 DIAGNOSIS — R112 Nausea with vomiting, unspecified: Secondary | ICD-10-CM | POA: Diagnosis not present

## 2017-11-07 DIAGNOSIS — Z7982 Long term (current) use of aspirin: Secondary | ICD-10-CM

## 2017-11-07 DIAGNOSIS — Z590 Homelessness: Secondary | ICD-10-CM

## 2017-11-07 DIAGNOSIS — E440 Moderate protein-calorie malnutrition: Secondary | ICD-10-CM | POA: Diagnosis present

## 2017-11-07 DIAGNOSIS — N400 Enlarged prostate without lower urinary tract symptoms: Secondary | ICD-10-CM | POA: Diagnosis present

## 2017-11-07 DIAGNOSIS — E7800 Pure hypercholesterolemia, unspecified: Secondary | ICD-10-CM | POA: Diagnosis present

## 2017-11-07 DIAGNOSIS — I130 Hypertensive heart and chronic kidney disease with heart failure and stage 1 through stage 4 chronic kidney disease, or unspecified chronic kidney disease: Secondary | ICD-10-CM | POA: Diagnosis present

## 2017-11-07 DIAGNOSIS — I69322 Dysarthria following cerebral infarction: Secondary | ICD-10-CM

## 2017-11-07 DIAGNOSIS — R011 Cardiac murmur, unspecified: Secondary | ICD-10-CM | POA: Diagnosis present

## 2017-11-07 DIAGNOSIS — I472 Ventricular tachycardia: Secondary | ICD-10-CM | POA: Diagnosis present

## 2017-11-07 DIAGNOSIS — E860 Dehydration: Secondary | ICD-10-CM | POA: Diagnosis present

## 2017-11-07 DIAGNOSIS — Z79899 Other long term (current) drug therapy: Secondary | ICD-10-CM

## 2017-11-07 DIAGNOSIS — I428 Other cardiomyopathies: Secondary | ICD-10-CM | POA: Diagnosis present

## 2017-11-07 DIAGNOSIS — N183 Chronic kidney disease, stage 3 (moderate): Secondary | ICD-10-CM | POA: Diagnosis present

## 2017-11-07 LAB — CBG MONITORING, ED: Glucose-Capillary: 107 mg/dL — ABNORMAL HIGH (ref 65–99)

## 2017-11-07 LAB — HEPATIC FUNCTION PANEL
ALT: 18 U/L (ref 17–63)
AST: 31 U/L (ref 15–41)
Albumin: 3.2 g/dL — ABNORMAL LOW (ref 3.5–5.0)
Alkaline Phosphatase: 118 U/L (ref 38–126)
BILIRUBIN DIRECT: 0.1 mg/dL (ref 0.1–0.5)
BILIRUBIN INDIRECT: 0.4 mg/dL (ref 0.3–0.9)
BILIRUBIN TOTAL: 0.5 mg/dL (ref 0.3–1.2)
Total Protein: 6.7 g/dL (ref 6.5–8.1)

## 2017-11-07 LAB — CBC
HEMATOCRIT: 37.6 % — AB (ref 39.0–52.0)
HEMOGLOBIN: 11.2 g/dL — AB (ref 13.0–17.0)
MCH: 25.5 pg — AB (ref 26.0–34.0)
MCHC: 29.8 g/dL — ABNORMAL LOW (ref 30.0–36.0)
MCV: 85.5 fL (ref 78.0–100.0)
Platelets: 167 10*3/uL (ref 150–400)
RBC: 4.4 MIL/uL (ref 4.22–5.81)
RDW: 15.4 % (ref 11.5–15.5)
WBC: 4.2 10*3/uL (ref 4.0–10.5)

## 2017-11-07 LAB — URINALYSIS, ROUTINE W REFLEX MICROSCOPIC
BACTERIA UA: NONE SEEN
BILIRUBIN URINE: NEGATIVE
Glucose, UA: NEGATIVE mg/dL
Hgb urine dipstick: NEGATIVE
KETONES UR: NEGATIVE mg/dL
Leukocytes, UA: NEGATIVE
Nitrite: NEGATIVE
PROTEIN: NEGATIVE mg/dL
Specific Gravity, Urine: <1.005 — ABNORMAL LOW (ref 1.005–1.030)
pH: 7 (ref 5.0–8.0)

## 2017-11-07 LAB — BASIC METABOLIC PANEL
ANION GAP: 8 (ref 5–15)
BUN: 21 mg/dL — ABNORMAL HIGH (ref 6–20)
CHLORIDE: 102 mmol/L (ref 101–111)
CO2: 29 mmol/L (ref 22–32)
CREATININE: 1.61 mg/dL — AB (ref 0.61–1.24)
Calcium: 8.6 mg/dL — ABNORMAL LOW (ref 8.9–10.3)
GFR calc Af Amer: 49 mL/min — ABNORMAL LOW (ref >60)
GFR calc non Af Amer: 43 mL/min — ABNORMAL LOW (ref >60)
Glucose, Bld: 147 mg/dL — ABNORMAL HIGH (ref 65–99)
POTASSIUM: 3.6 mmol/L (ref 3.5–5.1)
Sodium: 139 mmol/L (ref 135–145)

## 2017-11-07 LAB — I-STAT TROPONIN, ED: TROPONIN I, POC: 0.01 ng/mL (ref 0.00–0.08)

## 2017-11-07 LAB — ETHANOL: Alcohol, Ethyl (B): <10 mg/dL (ref <10)

## 2017-11-07 MED ORDER — HEPARIN SODIUM (PORCINE) 5000 UNIT/ML IJ SOLN
5000.0000 [IU] | Freq: Three times a day (TID) | INTRAMUSCULAR | Status: DC
  Administered 2017-11-07 – 2017-11-09 (×6): 5000 [IU] via SUBCUTANEOUS
  Filled 2017-11-07 (×6): qty 1

## 2017-11-07 MED ORDER — TAMSULOSIN HCL 0.4 MG PO CAPS
0.4000 mg | ORAL_CAPSULE | Freq: Every day | ORAL | Status: DC
  Administered 2017-11-08: 0.4 mg via ORAL
  Filled 2017-11-07 (×2): qty 1

## 2017-11-07 MED ORDER — LOSARTAN POTASSIUM 50 MG PO TABS
50.0000 mg | ORAL_TABLET | Freq: Every day | ORAL | Status: DC
  Administered 2017-11-08 – 2017-11-09 (×2): 50 mg via ORAL
  Filled 2017-11-07 (×2): qty 1

## 2017-11-07 MED ORDER — AMLODIPINE BESYLATE 10 MG PO TABS
10.0000 mg | ORAL_TABLET | Freq: Every day | ORAL | Status: DC
  Administered 2017-11-08 – 2017-11-09 (×2): 10 mg via ORAL
  Filled 2017-11-07 (×2): qty 1

## 2017-11-07 MED ORDER — ATORVASTATIN CALCIUM 10 MG PO TABS
10.0000 mg | ORAL_TABLET | Freq: Every day | ORAL | Status: DC
  Administered 2017-11-08 – 2017-11-09 (×2): 10 mg via ORAL
  Filled 2017-11-07 (×2): qty 1

## 2017-11-07 MED ORDER — ASPIRIN EC 81 MG PO TBEC
81.0000 mg | DELAYED_RELEASE_TABLET | Freq: Every day | ORAL | Status: DC
  Administered 2017-11-08 – 2017-11-09 (×2): 81 mg via ORAL
  Filled 2017-11-07 (×3): qty 1

## 2017-11-07 MED ORDER — HYDRALAZINE HCL 20 MG/ML IJ SOLN: 10.0000 mg | Freq: Four times a day (QID) | INTRAMUSCULAR | Status: DC | PRN

## 2017-11-07 MED ORDER — SODIUM CHLORIDE 0.9 % IV SOLN
INTRAVENOUS | Status: DC
  Administered 2017-11-07 – 2017-11-09 (×4): via INTRAVENOUS

## 2017-11-07 NOTE — ED Notes (Signed)
Patient transported to CT 

## 2017-11-07 NOTE — ED Notes (Signed)
Family at bedside. 

## 2017-11-07 NOTE — Progress Notes (Signed)
MD notified of patient needing orders.

## 2017-11-07 NOTE — ED Triage Notes (Signed)
Per [facility]: Patient to ED from [facility] following witnessed syncopal episode lasting 2-3 minutes at 1200 today. Patient was sitting on sidewalk and slumped over, denies pain. Patient states the only thing he felt prior to episode was feeling hot. He does endorse feeling depressed and emotional right now, but denies SI/HI or history of same. Neuro intact, does have some speech changes which he states are normal for him after stroke back in March. Not on blood thinners. EMS VS: 130/70, HR 68 NSR, 98% RA, CBG 171. Resp e/u, skin warm/dry.

## 2017-11-07 NOTE — Progress Notes (Signed)
MD notified of patient needing a VTE order.

## 2017-11-07 NOTE — ED Notes (Signed)
ED Provider at bedside. 

## 2017-11-07 NOTE — H&P (Signed)
History and Physical  [name] DJS:[id number] DOB: [birth year] DOA: 11/07/2017  Referring physician: Dr Rogene Houston  PCP: [name], MD  Outpatient Specialists: Cardiology Patient coming from: [place] ([facility])  Chief Complaint: Syncope  HPI: Isaac Reyes is a 67 y.o. male with medical history significant for CVA with residual dysarthria, nonsustained ventricular tachycardia, hypertension, chronic kidney disease stage III, hyperlipidemia, who presented to ED [facility] after a witnessed syncopal episode.  Per witnesses patient was shaking prior to his fall and was out for about 1 minute.  No reported urinary or bowel incontinence or tongue biting.  Patient has no recollection of the event.  Reports generalized weakness since his diagnosis of CVA back in March 2019.  Denies chest pain, palpitations, or dyspnea.    ED Course: Upon presentation to the ED, vital signs significant for accelerated hypertension.  CT head without contrast revealed no acute abnormalities.  Lab studies remarkable for CKD 3 with no evidence of AKI.  Twelve-lead EKG revealed normal sinus rhythm rate of 65.  Admitted as observation status for syncope.   Review of Systems: Review of systems as noted in the HPI.  All other systems reviewed and are negative.   Past Medical History:  Diagnosis Date  . Acute CVA (cerebrovascular accident) ([place]) 06/2017   "speech issues since" (07/24/2017)  . AKI (acute kidney injury) ([place]) 07/24/2017  . Anemia   . High cholesterol   . Lupus ([place])    "skin kind"  . NSVT (nonsustained ventricular tachycardia) ([facility])    Past Surgical History:  Procedure Laterality Date  . KNEE ARTHROSCOPY    . TONSILLECTOMY      Social History:  reports that he has been smoking cigars and cigarettes.  He has a 5.40 pack-year smoking history. He has never used smokeless tobacco. He reports that he drinks about 1.8 oz of alcohol per week. He reports that he does not use drugs.   No  Known Allergies  Family History  Problem Relation Age of Onset  . Heart attack Father     Father with history of hypertension.  Prior to Admission medications   Medication Sig Start Date End Date Taking? Authorizing Provider  amLODipine (NORVASC) 10 MG tablet Take 1 tablet (10 mg total) by mouth daily. 07/27/17  Yes [name], [name], MD  ASPIRIN LOW DOSE 81 MG EC tablet Take 81 mg by mouth daily. 09/26/17  Yes [provider]  atorvastatin (LIPITOR) 10 MG tablet Take 10 mg by mouth daily. 09/27/17  Yes [provider]  losartan (COZAAR) 50 MG tablet Take 50 mg by mouth daily. 09/27/17  Yes [provider]  tamsulosin (FLOMAX) 0.4 MG CAPS capsule Take 0.4 mg by mouth daily. 10/03/17  Yes [provider]  aspirin 325 MG tablet Take 1 tablet (325 mg total) by mouth daily. Patient not taking: Reported on 11/07/2017 07/20/17   [name], DO  thiamine 100 MG tablet Take 1 tablet (100 mg total) by mouth daily. Patient not taking: Reported on 11/07/2017 07/27/17   [name], MD    Physical Exam: BP (!) 168/90 (BP Location: Right Arm)   Pulse 62   Temp 98.4 F (36.9 C) (Oral)   Resp 19   Wt 87.2 kg (192 lb 3.9 oz)   SpO2 90%   BMI 24.68 kg/m   . General: 67 y.o. year-old male well developed well nourished in no acute distress.  Alert and oriented x3. . Cardiovascular: Regular rate and rhythm with no  rubs or gallops.  No thyromegaly or JVD noted.   Marland Kitchen Respiratory: Clear to auscultation with no wheezes or rales. Good inspiratory effort. . Abdomen: Soft nontender nondistended with normal bowel sounds x4 quadrants. . Musculoskeletal: No lower extremity edema. 2/4 pulses in all 4 extremities. . Skin: No ulcerative lesions noted or rashes, . Psychiatry: Mood is appropriate for condition and setting          Labs on Admission:  Basic Metabolic Panel: Recent Labs  Lab 11/07/17 1257  NA 139  K 3.6  CL 102  CO2 29  GLUCOSE 147*  BUN 21*  CREATININE 1.61*    CALCIUM 8.6*   Liver Function Tests: Recent Labs  Lab 11/07/17 1257  AST 31  ALT 18  ALKPHOS 118  BILITOT 0.5  PROT 6.7  ALBUMIN 3.2*   No results for input(s): LIPASE, AMYLASE in the last 168 hours. No results for input(s): AMMONIA in the last 168 hours. CBC: Recent Labs  Lab 11/07/17 1257  WBC 4.2  HGB 11.2*  HCT 37.6*  MCV 85.5  PLT 167   Cardiac Enzymes: No results for input(s): CKTOTAL, CKMB, CKMBINDEX, TROPONINI in the last 168 hours.  BNP (last 3 results) No results for input(s): BNP in the last 8760 hours.  ProBNP (last 3 results) No results for input(s): PROBNP in the last 8760 hours.  CBG: Recent Labs  Lab 11/07/17 1346  GLUCAP 107*    Radiological Exams on Admission: Dg Chest 2 View  Result Date: 11/07/2017 CLINICAL DATA:  Syncopal episode today, confusion EXAM: CHEST - 2 VIEW COMPARISON:  Chest x-ray of 07/18/2016 FINDINGS: No active infiltrate or effusion is seen. Mediastinal and hilar contours are stable and borderline cardiomegaly is stable. No acute bony abnormality is seen. IMPRESSION: No active lung disease.  Stable borderline cardiomegaly. Electronically Signed   By: Ivar Drape M.D.   On: 11/07/2017 14:35   Ct Head Wo Contrast  Result Date: 11/07/2017 CLINICAL DATA:  Altered level of consciousness.  Syncope EXAM: CT HEAD WITHOUT CONTRAST TECHNIQUE: Contiguous axial images were obtained from the base of the skull through the vertex without intravenous contrast. COMPARISON:  CT 07/17/2017 FINDINGS: Brain: Ventricle size normal. Moderate chronic microvascular ischemic changes in the white matter and basal ganglia bilaterally, as noted previously. Negative for acute infarct. Negative for acute hemorrhage mass or edema. No midline shift. Vascular: Negative for hyperdense vessel Skull: Negative Sinuses/Orbits: Mild mucosal edema paranasal sinuses.  Normal orbit Other: None IMPRESSION: No acute abnormality. Moderate chronic microvascular ischemic changes  unchanged from prior studies. Electronically Signed   By: Franchot Gallo M.D.   On: 11/07/2017 14:27    EKG: Independently reviewed.  Personally reviewed EKG which revealed sinus rhythm at a rate of 65.  Assessment/Plan Present on Admission: . Syncope  Active Problems:   Syncope  Syncope with unclear etiology Suspect cardiac Patient reports he has an appointment with cardiology on Monday, November 11, 2017 Please consult cardiology in the morning Continue telemetry monitoring Obtain bilateral carotid duplex ultrasound CT head without contrast unremarkable for any acute findings  Generalized weakness Most likely multifactorial Urine Analysis negative PT to assess  Accelerated hypertension Continue antihypertensive medications Amlodipine and losartan  History of CVA with residual dysarthria Continue aspirin and Lipitor  BPH Continue Flomax Monitor urine output  CKD 3 Baseline creatinine 1.6 At baseline Avoid nephrotoxic agents/hypotension/dehydration Repeat BMP in the morning  Hyperlipidemia Continue Lipitor  History of nonsustained V. tach Consult cardiology in the morning Continue to observe  on telemetry Self-reported having an appointment with cardiology on Monday, November 11, 2017. Patient does not recall the name of his cardiologist which he will be seeing for the first time    DVT prophylaxis: Heparin 3 times daily  Code Status: DNR  Family Communication: None at bedside  Disposition Plan: Admit to telemetry unit  Consults called: None.  Please call cardiology in the morning for a consult.  Admission status: Observation status    [name] MD [facility] Pager [phone]  If 7PM-7AM, please contact night-coverage [email or web address] Password [facility]  11/07/2017, 6:05 PM

## 2017-11-07 NOTE — ED Provider Notes (Signed)
[place] EMERGENCY DEPARTMENT Provider Note   CSN: [id number] Arrival date & time: 11/07/17  1242     History   Chief Complaint Chief Complaint  Patient presents with  . Loss of Consciousness    HPI Isaac Reyes is a 67 y.o. male.  Patient brought in by EMS from [employer].  Patient apparently had a witnessed syncopal episode.  None of the witnesses here.  EMS stated that it lasted 2 to 3 minutes it happened around 12 noon today.  Patient states he hardly went out at all.  But the report was that he was sitting on the sidewalk and slumped over.  Patient states only thing he felt prior to the episode was feeling hot.  Patient not very clear on the episode though.  Patient with prior stroke.  Patient also has a history of lupus and acute kidney injury.  His stroke was in February 2019.  Last seen on May 13 for dizziness and was discharged home.  Was admitted March 6 for acute kidney injury.  And was seen February 27 for a aphasia and fall and that was for a CVA.  Was admitted at that time as well.  Patient denies any complaints here at all currently.  His room air sats were 98%.  Blood sugar up as per EMS was 171.     Past Medical History:  Diagnosis Date  . Acute CVA (cerebrovascular accident) ([place]) 06/2017   "speech issues since" (07/24/2017)  . AKI (acute kidney injury) ([place]) 07/24/2017  . Anemia   . High cholesterol   . Lupus ([place])    "skin kind"  . NSVT (nonsustained ventricular tachycardia) [facility])     Patient Active Problem List   Diagnosis Date Noted  . Benign essential HTN 07/26/2017  . Acute kidney failure ([place]) 07/24/2017  . CVA (cerebral vascular accident) ([place]) 07/19/2017  . Stroke (cerebrum) ([place]) 07/18/2017  . NSVT (nonsustained ventricular tachycardia) ([place]) 07/18/2017  . Elevated blood pressure reading 07/18/2017  . Normocytic normochromic anemia 07/18/2017  . Malnutrition of moderate degree 07/18/2017  . Normocytic anemia   .  Frequent falls   . Renal insufficiency   . Hyperlipidemia   . Heavy smoker   . Alcohol abuse   . Abnormal EKG 03/19/2013  . Pre-operative cardiovascular exam, new EKG abnormalities c/w ischemia 03/19/2013  . LVH (left ventricular hypertrophy) 03/19/2013  . Murmur, cardiac 03/19/2013    Past Surgical History:  Procedure Laterality Date  . KNEE ARTHROSCOPY    . TONSILLECTOMY          Home Medications    Prior to Admission medications   Medication Sig Start Date End Date Taking? Authorizing Provider  amLODipine (NORVASC) 10 MG tablet Take 1 tablet (10 mg total) by mouth daily. 07/27/17  Yes [name], [name], MD  ASPIRIN LOW DOSE 81 MG EC tablet Take 81 mg by mouth daily. 09/26/17  Yes [provider]  atorvastatin (LIPITOR) 10 MG tablet Take 10 mg by mouth daily. 09/27/17  Yes [provider]  losartan (COZAAR) 50 MG tablet Take 50 mg by mouth daily. 09/27/17  Yes [provider]  tamsulosin (FLOMAX) 0.4 MG CAPS capsule Take 0.4 mg by mouth daily. 10/03/17  Yes [provider]  aspirin 325 MG tablet Take 1 tablet (325 mg total) by mouth daily. Patient not taking: Reported on 11/07/2017 07/20/17   [name], DO  thiamine 100 MG tablet Take 1 tablet (100 mg total) by mouth daily.  Patient not taking: Reported on 11/07/2017 07/27/17   [name], MD    Family History Family History  Problem Relation Age of Onset  . Heart attack Father     Social History Social History   Tobacco Use  . Smoking status: Current Every Day Smoker    Packs/day: 0.12    Years: 45.00    Pack years: 5.40    Types: Cigars, Cigarettes  . Smokeless tobacco: Never Used  Substance Use Topics  . Alcohol use: Yes    Alcohol/week: 1.8 oz    Types: 3 Standard drinks or equivalent per week  . Drug use: No     Allergies   Patient has no known allergies.   Review of Systems Review of Systems  Constitutional: Negative for fever.  HENT: Negative for congestion.     Respiratory: Negative for shortness of breath.   Cardiovascular: Negative for chest pain.  Gastrointestinal: Negative for abdominal pain.  Genitourinary: Negative for dysuria.  Musculoskeletal: Negative for back pain.  Skin: Negative for rash.  Neurological: Positive for syncope, speech difficulty and weakness. Negative for headaches.  Hematological: Does not bruise/bleed easily.  Psychiatric/Behavioral: Negative for confusion.     Physical Exam Updated Vital Signs BP (!) 135/96   Pulse 70   Temp 98.4 F (36.9 C) (Oral)   Resp (!) 23   SpO2 98%   Physical Exam  Constitutional: He is oriented to person, place, and time. He appears well-developed and well-nourished. No distress.  HENT:  Head: Normocephalic and atraumatic.  Mouth/Throat: Oropharynx is clear and moist.  Eyes: Pupils are equal, round, and reactive to light. EOM are normal.  Neck: Normal range of motion. Neck supple.  Cardiovascular: Normal rate, regular rhythm and normal heart sounds.  Pulmonary/Chest: Effort normal and breath sounds normal. No respiratory distress.  Abdominal: Soft. Bowel sounds are normal. There is no tenderness.  Musculoskeletal: Normal range of motion.  Neurological: He is alert and oriented to person, place, and time. No cranial nerve deficit or sensory deficit. He exhibits normal muscle tone. Coordination normal.  Except for some slight weakness on the right side.  Skin: Skin is warm.  Nursing note and vitals reviewed.    ED Treatments / Results  Labs (all labs ordered are listed, but only abnormal results are displayed) Labs Reviewed  BASIC METABOLIC PANEL - Abnormal; Notable for the following components:      Result Value   Glucose, Bld 147 (*)    BUN 21 (*)    Creatinine, Ser 1.61 (*)    Calcium 8.6 (*)    GFR calc non Af Amer 43 (*)    GFR calc Af Amer 49 (*)    All other components within normal limits  CBC - Abnormal; Notable for the following components:   Hemoglobin  11.2 (*)    HCT 37.6 (*)    MCH 25.5 (*)    MCHC 29.8 (*)    All other components within normal limits  HEPATIC FUNCTION PANEL - Abnormal; Notable for the following components:   Albumin 3.2 (*)    All other components within normal limits  CBG MONITORING, ED - Abnormal; Notable for the following components:   Glucose-Capillary 107 (*)    All other components within normal limits  ETHANOL  URINALYSIS, ROUTINE W REFLEX MICROSCOPIC  I-STAT TROPONIN, ED    EKG EKG Interpretation  Date/Time:  Thursday November 07 2017 12:53:57 EDT Ventricular Rate:  65 PR Interval:  170 QRS Duration: 108 QT Interval:  430 QTC Calculation: 447 R Axis:   -43 Text Interpretation:  Normal sinus rhythm Left axis deviation Incomplete right bundle branch block Moderate voltage criteria for LVH, may be normal variant T wave abnormality, consider inferolateral ischemia Abnormal ECG Confirmed by Fredia Sorrow [phone]) on 11/07/2017 1:36:56 PM   Radiology Dg Chest 2 View  Result Date: 11/07/2017 CLINICAL DATA:  Syncopal episode today, confusion EXAM: CHEST - 2 VIEW COMPARISON:  Chest x-ray of 07/18/2016 FINDINGS: No active infiltrate or effusion is seen. Mediastinal and hilar contours are stable and borderline cardiomegaly is stable. No acute bony abnormality is seen. IMPRESSION: No active lung disease.  Stable borderline cardiomegaly. Electronically Signed   By: Ivar Drape M.D.   On: 11/07/2017 14:35   Ct Head Wo Contrast  Result Date: 11/07/2017 CLINICAL DATA:  Altered level of consciousness.  Syncope EXAM: CT HEAD WITHOUT CONTRAST TECHNIQUE: Contiguous axial images were obtained from the base of the skull through the vertex without intravenous contrast. COMPARISON:  CT 07/17/2017 FINDINGS: Brain: Ventricle size normal. Moderate chronic microvascular ischemic changes in the white matter and basal ganglia bilaterally, as noted previously. Negative for acute infarct. Negative for acute hemorrhage mass or edema. No  midline shift. Vascular: Negative for hyperdense vessel Skull: Negative Sinuses/Orbits: Mild mucosal edema paranasal sinuses.  Normal orbit Other: None IMPRESSION: No acute abnormality. Moderate chronic microvascular ischemic changes unchanged from prior studies. Electronically Signed   By: Franchot Gallo M.D.   On: 11/07/2017 14:27    Procedures Procedures (including critical care time)  Medications Ordered in ED Medications  0.9 %  sodium chloride infusion ( Intravenous New Bag/Given 11/07/17 1355)     Initial Impression / Assessment and Plan / ED Course  I have reviewed the triage vital signs and the nursing notes.  Pertinent labs & imaging results that were available during my care of the patient were reviewed by me and considered in my medical decision making (see chart for details).    Staff member from the [employer] arrived and stated the patient was out for at least a minute had a little bit of mild shaking.  No incontinence no extensive shaking.  But states it definitely took him a while to wake up.  Patient not on any blood thinners.  Labs here without any significant abnormalities.  Chest x-ray negative and head CT negative.  Contacted hospitalist for admission and for syncopal episode and patient over 50 years of age for cardiac monitoring.  No acute findings here today.   Final Clinical Impressions(s) / ED Diagnoses   Final diagnoses:  Syncope, unspecified syncope type    ED Discharge Orders    None       Fredia Sorrow, MD 11/07/17 1711

## 2017-11-08 ENCOUNTER — Observation Stay (HOSPITAL_COMMUNITY): Payer: Medicare Other

## 2017-11-08 DIAGNOSIS — E7800 Pure hypercholesterolemia, unspecified: Secondary | ICD-10-CM | POA: Diagnosis present

## 2017-11-08 DIAGNOSIS — Z6824 Body mass index (BMI) 24.0-24.9, adult: Secondary | ICD-10-CM | POA: Diagnosis not present

## 2017-11-08 DIAGNOSIS — F1010 Alcohol abuse, uncomplicated: Secondary | ICD-10-CM | POA: Diagnosis not present

## 2017-11-08 DIAGNOSIS — F17290 Nicotine dependence, other tobacco product, uncomplicated: Secondary | ICD-10-CM | POA: Diagnosis present

## 2017-11-08 DIAGNOSIS — I10 Essential (primary) hypertension: Secondary | ICD-10-CM | POA: Diagnosis not present

## 2017-11-08 DIAGNOSIS — Z590 Homelessness: Secondary | ICD-10-CM | POA: Diagnosis not present

## 2017-11-08 DIAGNOSIS — I951 Orthostatic hypotension: Secondary | ICD-10-CM | POA: Diagnosis not present

## 2017-11-08 DIAGNOSIS — E860 Dehydration: Secondary | ICD-10-CM | POA: Diagnosis not present

## 2017-11-08 DIAGNOSIS — N400 Enlarged prostate without lower urinary tract symptoms: Secondary | ICD-10-CM | POA: Diagnosis present

## 2017-11-08 DIAGNOSIS — I428 Other cardiomyopathies: Secondary | ICD-10-CM | POA: Diagnosis not present

## 2017-11-08 DIAGNOSIS — I63311 Cerebral infarction due to thrombosis of right middle cerebral artery: Secondary | ICD-10-CM

## 2017-11-08 DIAGNOSIS — D649 Anemia, unspecified: Secondary | ICD-10-CM | POA: Diagnosis not present

## 2017-11-08 DIAGNOSIS — I69322 Dysarthria following cerebral infarction: Secondary | ICD-10-CM | POA: Diagnosis not present

## 2017-11-08 DIAGNOSIS — R509 Fever, unspecified: Secondary | ICD-10-CM | POA: Diagnosis not present

## 2017-11-08 DIAGNOSIS — I130 Hypertensive heart and chronic kidney disease with heart failure and stage 1 through stage 4 chronic kidney disease, or unspecified chronic kidney disease: Secondary | ICD-10-CM | POA: Diagnosis not present

## 2017-11-08 DIAGNOSIS — F17210 Nicotine dependence, cigarettes, uncomplicated: Secondary | ICD-10-CM | POA: Diagnosis present

## 2017-11-08 DIAGNOSIS — R296 Repeated falls: Secondary | ICD-10-CM | POA: Diagnosis not present

## 2017-11-08 DIAGNOSIS — Z8249 Family history of ischemic heart disease and other diseases of the circulatory system: Secondary | ICD-10-CM | POA: Diagnosis not present

## 2017-11-08 DIAGNOSIS — R569 Unspecified convulsions: Secondary | ICD-10-CM | POA: Diagnosis not present

## 2017-11-08 DIAGNOSIS — E440 Moderate protein-calorie malnutrition: Secondary | ICD-10-CM | POA: Diagnosis not present

## 2017-11-08 DIAGNOSIS — I472 Ventricular tachycardia: Secondary | ICD-10-CM | POA: Diagnosis not present

## 2017-11-08 DIAGNOSIS — Z66 Do not resuscitate: Secondary | ICD-10-CM | POA: Diagnosis present

## 2017-11-08 DIAGNOSIS — R55 Syncope and collapse: Principal | ICD-10-CM

## 2017-11-08 DIAGNOSIS — N289 Disorder of kidney and ureter, unspecified: Secondary | ICD-10-CM | POA: Diagnosis not present

## 2017-11-08 DIAGNOSIS — R9431 Abnormal electrocardiogram [ECG] [EKG]: Secondary | ICD-10-CM | POA: Diagnosis not present

## 2017-11-08 DIAGNOSIS — L80 Vitiligo: Secondary | ICD-10-CM | POA: Diagnosis present

## 2017-11-08 DIAGNOSIS — I5032 Chronic diastolic (congestive) heart failure: Secondary | ICD-10-CM | POA: Diagnosis not present

## 2017-11-08 DIAGNOSIS — N183 Chronic kidney disease, stage 3 (moderate): Secondary | ICD-10-CM | POA: Diagnosis present

## 2017-11-08 DIAGNOSIS — E785 Hyperlipidemia, unspecified: Secondary | ICD-10-CM | POA: Diagnosis present

## 2017-11-08 LAB — COMPREHENSIVE METABOLIC PANEL
ALBUMIN: 3 g/dL — AB (ref 3.5–5.0)
ALK PHOS: 114 U/L (ref 38–126)
ALT: 18 U/L (ref 17–63)
AST: 22 U/L (ref 15–41)
Anion gap: 5 (ref 5–15)
BILIRUBIN TOTAL: 0.8 mg/dL (ref 0.3–1.2)
BUN: 14 mg/dL (ref 6–20)
CALCIUM: 8.5 mg/dL — AB (ref 8.9–10.3)
CO2: 28 mmol/L (ref 22–32)
CREATININE: 1.26 mg/dL — AB (ref 0.61–1.24)
Chloride: 108 mmol/L (ref 101–111)
GFR calc Af Amer: >60 mL/min (ref >60)
GFR calc non Af Amer: 57 mL/min — ABNORMAL LOW (ref >60)
GLUCOSE: 89 mg/dL (ref 65–99)
Potassium: 3.7 mmol/L (ref 3.5–5.1)
SODIUM: 141 mmol/L (ref 135–145)
TOTAL PROTEIN: 6.1 g/dL — AB (ref 6.5–8.1)

## 2017-11-08 LAB — CBC
HEMATOCRIT: 37 % — AB (ref 39.0–52.0)
HEMOGLOBIN: 11.4 g/dL — AB (ref 13.0–17.0)
MCH: 25.7 pg — ABNORMAL LOW (ref 26.0–34.0)
MCHC: 30.8 g/dL (ref 30.0–36.0)
MCV: 83.3 fL (ref 78.0–100.0)
Platelets: 163 10*3/uL (ref 150–400)
RBC: 4.44 MIL/uL (ref 4.22–5.81)
RDW: 15.3 % (ref 11.5–15.5)
WBC: 3.4 10*3/uL — AB (ref 4.0–10.5)

## 2017-11-08 LAB — TROPONIN I
Troponin I: 0.05 ng/mL (ref <0.03)
Troponin I: 0.05 ng/mL (ref <0.03)
Troponin I: 0.06 ng/mL (ref <0.03)

## 2017-11-08 MED ORDER — DM-GUAIFENESIN ER 30-600 MG PO TB12
1.0000 | ORAL_TABLET | Freq: Two times a day (BID) | ORAL | Status: DC
  Administered 2017-11-08 – 2017-11-09 (×3): 1 via ORAL
  Filled 2017-11-08 (×3): qty 1

## 2017-11-08 MED ORDER — ACETAMINOPHEN 325 MG PO TABS
650.0000 mg | ORAL_TABLET | Freq: Four times a day (QID) | ORAL | Status: DC | PRN
  Administered 2017-11-09: 650 mg via ORAL
  Filled 2017-11-08: qty 2

## 2017-11-08 NOTE — Progress Notes (Signed)
Patient refused orthostatic vital signs at this time.

## 2017-11-08 NOTE — Progress Notes (Addendum)
   11/08/17 2059 11/08/17 2106  Vitals  BP (!) 157/84 (!) 137/114  MAP (mmHg) 106 122  BP Location Left Arm Left Arm  BP Method Automatic Automatic  Patient Position (if appropriate) Lying Sitting  Pulse Rate 92 95  ECG Heart Rate 88 97  Resp 18 18  Oxygen Therapy  SpO2 100 % 100 %  O2 Device Room Air Room Air   Orthostatic vitals attempted at this time. While sitting on the edge of the bed pt became lightheaded, dizzy, and diaphoretic. Pt became  Lethargic and unable to stand at this time, stating "I'm going to pass out". Pt returned to lying position. Confirmed NSR (80-90s) during event with telemetry tech and tele monitor. Pt recovered upon lying in bed appox 2 minutes. Will continue to monitor. Bed alarm on for safety.

## 2017-11-08 NOTE — Procedures (Signed)
HPI: 67 y/o with syncope  TECHNICAL SUMMARY:  A multichannel referential and bipolar montage EEG using the standard international 10-20 system was performed on the patient described as awake.  The dominant background activity consists of 8.5-9 hertz activity seen most prominantly over the posterior head region.  The backgound activity is reactive to eye opening and closing procedures.  Low voltage fast (beta) activity is distributed symmetrically and maximally over the anterior head regions.  ACTIVATION:  Stepwise photic stimulation and hyperventilation were not performed.  EPILEPTIFORM ACTIVITY:  There were no spikes, sharp waves or paroxysmal activity.  SLEEP: No sleep is noted.   IMPRESSION:  This is a normal EEG for the patients stated age.  There were no focal, hemispheric or lateralizing features.  No epileptiform activity was recorded.  A normal EEG does not exclude the diagnosis of a seizure disorder and if seizure remains high on the list of differential diagnosis, an ambulatory EEG may be of value.  Clinical correlation is required.

## 2017-11-08 NOTE — Progress Notes (Addendum)
PROGRESS NOTE    Isaac Reyes [facility]  SJG:[id number] DOB: 1950-09-19 DOA: 11/07/2017 PCP: [name], MD    Brief Narrative:  Isaac Reyes is a 67 y.o. male with medical history significant for CVA with residual dysarthria, nonsustained ventricular tachycardia, hypertension, chronic kidney disease stage III, hyperlipidemia, who presented to ED [facility] after a witnessed syncopal episode.  Per witnesses patient was shaking prior to his fall and was out for about 1 minute. He was admitted to medical service for evaluation of syncope.    Assessment & Plan:   Active Problems:   Syncope   Syncope:  Differential include cardiac, vs orthostatic hypotension vs seizures vs from dehydration.  Monitor on telemetry for evaluation of arrhythmias.  EKG on admission, shows sinus rhythm with abnormal t wave inversions in the lateral leads similar to the EKG from march 2019.  We have a recent echocardiogram from march 2019, showing good LVEF , no regional wall motion abn, and grade 1 diastolic dysfunction and hyperechogenic left ventricular myocardium, and to consider infiltrative process like amyloidosis.  He had a nuclear stress test in 2014, showing moderate non ischemic cardiomyopathy. He currently denies any chest pain or sob.  Get serial troponins and repeat EKG.  Get orthostatic blood pressure measurements.  EEG to rule out seizures as he had recent CVA. CT head without contrast on admission negative for new stroke.  Continue with aspirin for now.  Get PT /OT evaluation.     H/o recent CVA: Get lipid panel and resume aspirin and lipitor.    BPH; Resume flomax.    Acute on Stage 3 CKD: Creatinine was elevated on admission, which has improved with fluids back to baseline which suggests pt is probably dehydrated.     Hyperlipidemia: Resume lipitor.    H/o NSVT: None since admission. Not on BB.  Monitor.    Mild normocytic anemia: Hemoglobin stable around 11.    H/o  alcohol abuse:  Alcohol level on admission less than 10.  Monitor for signs of withdrawal.     DVT prophylaxis: sq heparin.  Code Status:DNR Family Communication: none at bedside.  Disposition Plan: pending further evaluation.    Consultants:   None.   Procedures:  EEG   Antimicrobials:NONE.   Subjective: Denies any chest pain , sob. Palpitations, headache.   Objective: Vitals:   11/07/17 2309 11/08/17 0527 11/08/17 0608 11/08/17 0830  BP: (!) 165/88 (!) 144/97  (!) 157/84  Pulse: 64 72    Resp: 18 20    Temp: 98.5 F (36.9 C) 99.3 F (37.4 C)    TempSrc: Oral Oral    SpO2: 96% 100%    Weight:      Height:   6\' 2"  (1.88 m)     Intake/Output Summary (Last 24 hours) at 11/08/2017 0832 Last data filed at 11/08/2017 0730 Gross per 24 hour  Intake 925.65 ml  Output 2030 ml  Net -1104.35 ml   Filed Weights   11/07/17 1725  Weight: 87.2 kg (192 lb 3.9 oz)    Examination:  General exam: Appears calm and comfortable  Respiratory system: Clear to auscultation. Respiratory effort normal. Cardiovascular system: S1 & S2 heard, RRR. No JVD, murmurs,. No pedal edema. Gastrointestinal system: Abdomen is nondistended, soft and nontender. No organomegaly or masses felt. Normal bowel sounds heard. Central nervous system: Alert and oriented. Non focal.  Extremities: Symmetric 5 x 5 power. Skin: No rashes, lesions or ulcers Psychiatry:  Mood & affect appropriate.  Data Reviewed: I have personally reviewed following labs and imaging studies  CBC: Recent Labs  Lab 11/07/17 1257 11/08/17 0536  WBC 4.2 3.4*  HGB 11.2* 11.4*  HCT 37.6* 37.0*  MCV 85.5 83.3  PLT 167 185   Basic Metabolic Panel: Recent Labs  Lab 11/07/17 1257 11/08/17 0536  NA 139 141  K 3.6 3.7  CL 102 108  CO2 29 28  GLUCOSE 147* 89  BUN 21* 14  CREATININE 1.61* 1.26*  CALCIUM 8.6* 8.5*   GFR: Estimated Creatinine Clearance: 66.1 mL/min (A) (by C-G formula based on SCr of 1.26 mg/dL  (H)). Liver Function Tests: Recent Labs  Lab 11/07/17 1257 11/08/17 0536  AST 31 22  ALT 18 18  ALKPHOS 118 114  BILITOT 0.5 0.8  PROT 6.7 6.1*  ALBUMIN 3.2* 3.0*   No results for input(s): LIPASE, AMYLASE in the last 168 hours. No results for input(s): AMMONIA in the last 168 hours. Coagulation Profile: No results for input(s): INR, PROTIME in the last 168 hours. Cardiac Enzymes: No results for input(s): CKTOTAL, CKMB, CKMBINDEX, TROPONINI in the last 168 hours. BNP (last 3 results) No results for input(s): PROBNP in the last 8760 hours. HbA1C: No results for input(s): HGBA1C in the last 72 hours. CBG: Recent Labs  Lab 11/07/17 1346  GLUCAP 107*   Lipid Profile: No results for input(s): CHOL, HDL, LDLCALC, TRIG, CHOLHDL, LDLDIRECT in the last 72 hours. Thyroid Function Tests: No results for input(s): TSH, T4TOTAL, FREET4, T3FREE, THYROIDAB in the last 72 hours. Anemia Panel: No results for input(s): VITAMINB12, FOLATE, FERRITIN, TIBC, IRON, RETICCTPCT in the last 72 hours. Sepsis Labs: No results for input(s): PROCALCITON, LATICACIDVEN in the last 168 hours.  No results found for this or any previous visit (from the past 240 hour(s)).       Radiology Studies: Dg Chest 2 View  Result Date: 11/07/2017 CLINICAL DATA:  Syncopal episode today, confusion EXAM: CHEST - 2 VIEW COMPARISON:  Chest x-ray of 07/18/2016 FINDINGS: No active infiltrate or effusion is seen. Mediastinal and hilar contours are stable and borderline cardiomegaly is stable. No acute bony abnormality is seen. IMPRESSION: No active lung disease.  Stable borderline cardiomegaly. Electronically Signed   By: Ivar Drape M.D.   On: 11/07/2017 14:35   Ct Head Wo Contrast  Result Date: 11/07/2017 CLINICAL DATA:  Altered level of consciousness.  Syncope EXAM: CT HEAD WITHOUT CONTRAST TECHNIQUE: Contiguous axial images were obtained from the base of the skull through the vertex without intravenous contrast.  COMPARISON:  CT 07/17/2017 FINDINGS: Brain: Ventricle size normal. Moderate chronic microvascular ischemic changes in the white matter and basal ganglia bilaterally, as noted previously. Negative for acute infarct. Negative for acute hemorrhage mass or edema. No midline shift. Vascular: Negative for hyperdense vessel Skull: Negative Sinuses/Orbits: Mild mucosal edema paranasal sinuses.  Normal orbit Other: None IMPRESSION: No acute abnormality. Moderate chronic microvascular ischemic changes unchanged from prior studies. Electronically Signed   By: Franchot Gallo M.D.   On: 11/07/2017 14:27        Scheduled Meds: . amLODipine  10 mg Oral Daily  . aspirin EC  81 mg Oral Daily  . atorvastatin  10 mg Oral Daily  . dextromethorphan-guaiFENesin  1 tablet Oral BID  . heparin injection (subcutaneous)  5,000 Units Subcutaneous Q8H  . losartan  50 mg Oral Daily  . tamsulosin  0.4 mg Oral Daily   Continuous Infusions: . sodium chloride 75 mL/hr at 11/08/17 [phone]  LOS: 0 days    Time spent: 35 minutes.     Hosie Poisson, MD [facility] Pager [phone]   If 7PM-7AM, please contact night-coverage [email or web address] Password [facility] 11/08/2017, 8:32 AM

## 2017-11-08 NOTE — Congregational Nurse Program (Signed)
Congregational Nurse Program Note  Date of Encounter: 11/04/2017  Past Medical History: Past Medical History:  Diagnosis Date  . Acute CVA (cerebrovascular accident) ([place]) 06/2017   "speech issues since" (07/24/2017)  . AKI (acute kidney injury) ([place]) 07/24/2017  . Anemia   . High cholesterol   . Lupus ([place])    "skin kind"  . NSVT (nonsustained ventricular tachycardia) ([place])     Encounter Details: CNP Questionnaire - 11/08/17 1015      Questionnaire   Patient Status  Not Applicable    Race  Black or African American    Location Patient [place]  Medicare    Uninsured  Not Applicable    Food  No food insecurities    Housing/Utilities  No permanent housing    Transportation  Yes, need transportation assistance    Interpersonal Safety  No, do not feel physically and emotionally safe where you currently live    Medication  Yes, have medication insecurities    Medical Provider  Yes    Referrals  Primary Care Provider/Clinic;Other    ED Visit Averted  Not Applicable    Life-Saving Intervention Made  Not Applicable     BP check. Client states that he has cardiology appointment on 6/24 and would like transportation assistance. Client states that he thinks he has a new Bp medication prescribed from his PCP  Last week. CN will call [facility] to confirm. CN gave SCAT application with directions to complete. Cn advised client to take all medication s to Cardiology appointment next week. Client to follow up in clinic tomorrow.

## 2017-11-08 NOTE — Progress Notes (Signed)
Physical Therapy Treatment Patient Details Name: Isaac Reyes MRN: [id number] DOB: 09-03-1950 Today's Date: 11/08/2017    History of Present Illness Pt is a 67 y.o. M with significant PMH of CVA with residual dysarthria, nonsustained ventricular tachycardia, hypertension, chronic kidney disease stage III, hyperlipidemia, who presented after a witness syncopal episode in which he was shaking prior to his fall and out for about 1 minute.    PT Comments    Patient is homeless from [facility]; states he enjoys walking prior to admission. Upon PT evaluation, patient ambulating 250 feet with supervision and no device without difficulty. Demonstrates good gait speed and posture throughout. Will continue to follow acutely for high level balance activities but no follow up physical therapy needed.     Follow Up Recommendations  No PT follow up     Equipment Recommendations  None recommended by PT    Recommendations for Other Services       Precautions / Restrictions Precautions Precautions: Fall Restrictions Weight Bearing Restrictions: No    Mobility  Bed Mobility Overal bed mobility: Independent             General bed mobility comments: Patient soaked in urine upon arrival. Changed bed linens and gown.  Transfers Overall transfer level: Independent Equipment used: None                Ambulation/Gait Ambulation/Gait assistance: Supervision Gait Distance (Feet): 250 Feet Assistive device: None Gait Pattern/deviations: Step-through pattern;Decreased dorsiflexion - right;Decreased dorsiflexion - left;Wide base of support     General Gait Details: bilateral foot external rotation (may be baseline) but overall no overt unsteadiness noted   Stairs             Wheelchair Mobility    Modified Rankin (Stroke Patients Only)       Balance Overall balance assessment: Mild deficits observed, not formally tested                                           Cognition Arousal/Alertness: Awake/alert Behavior During Therapy: Flat affect Overall Cognitive Status: No family/caregiver present to determine baseline cognitive functioning                                 General Comments: patient following multi step commands without difficulty.       Exercises      General Comments        Pertinent Vitals/Pain Pain Assessment: No/denies pain    Home Living Family/patient expects to be discharged to:: Shelter/Homeless               Additional Comments: From [employer]    Prior Function Level of Independence: Independent          PT Goals (current goals can now be found in the care plan section) Acute Rehab PT Goals Patient Stated Goal: not go to rehab PT Goal Formulation: With patient Time For Goal Achievement: 11/22/17 Potential to Achieve Goals: Fair    Frequency    Min 3X/week      PT Plan      Co-evaluation              AM-PAC PT "6 Clicks" Daily Activity  Outcome Measure  Difficulty turning over in bed (including adjusting bedclothes, sheets and blankets)?: None Difficulty moving from  lying on back to sitting on the side of the bed? : None Difficulty sitting down on and standing up from a chair with arms (e.g., wheelchair, bedside commode, etc,.)?: None Help needed moving to and from a bed to chair (including a wheelchair)?: A Little Help needed walking in hospital room?: A Little Help needed climbing 3-5 steps with a railing? : A Little 6 Click Score: 21    End of Session Equipment Utilized During Treatment: Gait belt Activity Tolerance: Patient tolerated treatment well Patient left: in bed;with call bell/phone within reach;with bed alarm set Nurse Communication: Mobility status PT Visit Diagnosis: Unsteadiness on feet (R26.81);Difficulty in walking, not elsewhere classified (R26.2)     Time: 1436-1500 PT Time Calculation (min) (ACUTE ONLY): 24  min  Charges:  $Therapeutic Activity: 8-22 mins                    G Codes:      Ellamae Sia, PT, DPT Acute Rehabilitation Services  Pager: [phone]    Willy Eddy 11/08/2017, 3:18 PM

## 2017-11-08 NOTE — Progress Notes (Signed)
CRITICAL VALUE ALERT  Critical Value:  Troponin 0.05  Date & Time Notied:  11/08/2017 and 1034  Provider Notified: Karleen Hampshire  Orders Received/Actions taken: Continue to monitor.

## 2017-11-08 NOTE — Progress Notes (Signed)
CSW received consult that patient is homeless and has been staying at [facility]. Per congregational nurse Jordan Hawks, [phone]), environment is not great for patient. She requested a referral to [facility] but he has ETOH use. Waiting on PT consult to see if he would require SNF under his Medicaid but his worker at [facility] [phone]) says he has been against SNF placement. Angel at [facility] is working on permanent housing for him. She is aware patient may need to return to [facility] if SNF doesn't apply.    Isaac Locus Deantae Shackleton LCSW [phone]

## 2017-11-08 NOTE — Congregational Nurse Program (Signed)
Congregational Nurse Program Note  Date of Encounter: 11/05/2017  Past Medical History: Past Medical History:  Diagnosis Date  . Acute CVA (cerebrovascular accident) ([place]) 06/2017   "speech issues since" (07/24/2017)  . AKI (acute kidney injury) ([place]) 07/24/2017  . Anemia   . High cholesterol   . Lupus ([place])    "skin kind"  . NSVT (nonsustained ventricular tachycardia) ([place])     Encounter Details: CNP Questionnaire - 11/08/17 1015      Questionnaire   Patient Status  Not Applicable    Race  Black or African American    Location Patient [place]  Medicare    Uninsured  Not Applicable    Food  No food insecurities    Housing/Utilities  No permanent housing    Transportation  Yes, need transportation assistance    Interpersonal Safety  No, do not feel physically and emotionally safe where you currently live    Medication  Yes, have medication insecurities    Medical Provider  Yes    Referrals  Primary Care Provider/Clinic;Other    ED Visit Averted  Not Applicable    Life-Saving Intervention Made  Not Applicable       BP check. CN spoke with Tye Maryland at [facility] and she advised that client's Losartan had been increased to 100mg  q day. Advise taking 50mg  x 2 until current prescription is out. CN advised client. HR is ireegular today and client states that he doesn't feel well. States that he has blood in his urine and stool. CN called client PCP to schedule appointment for tomorrow. Bus passes given.

## 2017-11-08 NOTE — Consult Note (Addendum)
Cardiology Consultation:   Patient ID: Isaac Reyes; [id number]; 1950-12-19   Admit date: 11/07/2017 Date of Consult: 11/08/2017  Primary Care Provider: [name], MD Primary Cardiologist: Pixie Casino, MD  Primary Electrophysiologist:     Patient Profile:   Isaac Reyes is a 67 y.o. male with a hx of lupus, HLD, CVA (06/2017), and CKD stage III who is being seen today for the evaluation of syncope at the request of Dr. Karleen Hampshire.  History of Present Illness:   Isaac Reyes was last seen by our office 03/2013 for a preoperative clearance for foot surgery. 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 He was recommended for myoview which was negative for ischemia. He also underwent echocardiogram 03/2013 which showed normal LVEF, grade 1 DD. He did not follow with cardiology. He suffered a stroke in 06/2017 prompting a repeat echo. Echo 07/18/17 showed normal LVEF, grade 1 DD, and hyperechogenic left ventricular myocardium and right ventricular wall thickness, suggestive of possible amyloidosis. Carotid duplex 06/2017 with 1-39% bilateral stenosis.   Isaac Reyes presented to [facility] after a witnessed syncopal episode. Per the witness in the ER, the patient was seen shaking prior to his fall and he was unconscious for about 1 minute. He was admitted to medicine service for observation. Carotid dopplers have been repeated and are stable compared to 06/2017 duplex. EEG was completed by neurology and was normal.   On my interview, pt does not remember having any palpitations, chest pain, or shortness of breath prior to his syncopal event. He has never passed out before. He states that it was hot outside and he had not eaten much lunch and was not drinking enough water. At the start of his syncopal event, he leaned against a brick wall outside and slid down. He did not hit his head. No one is at  bedside to corroborate. This occurred at the homeless shelter where he currently lives. He denies drug use, including cocaine and alcohol. He reports only drinking water. He has an appt with Dr. Debara Reyes in three days as a follow up after his stroke. He has residual weakness in both legs after stroke and states he can't walk as well as he used to walk. He does not use a walker at baseline.    Past Medical History:  Diagnosis Date  . Acute CVA (cerebrovascular accident) ([place]) 06/2017   "speech issues since" (07/24/2017)  . AKI (acute kidney injury) ([place]) 07/24/2017  . Anemia   . High cholesterol   . Lupus ([place])    "skin kind"  . NSVT (nonsustained ventricular tachycardia) ([facility])     Past Surgical History:  Procedure Laterality Date  . KNEE ARTHROSCOPY    . TONSILLECTOMY       Home Medications:  Prior to Admission medications   Medication Sig Start Date End Date Taking? Authorizing Provider  amLODipine (NORVASC) 10 MG tablet Take 1 tablet (10 mg total) by mouth daily. 07/27/17  Yes [name], [name], MD  ASPIRIN LOW DOSE 81 MG EC tablet Take 81 mg by mouth daily. 09/26/17  Yes [provider]  atorvastatin (LIPITOR) 10 MG tablet Take 10 mg by mouth daily. 09/27/17  Yes [provider]  losartan (COZAAR) 50 MG tablet Take 50 mg by mouth daily. 09/27/17  Yes [provider]  tamsulosin (FLOMAX) 0.4 MG CAPS capsule Take 0.4 mg by mouth daily. 10/03/17  Yes [provider]  aspirin 325 MG tablet Take 1 tablet (325 mg total) by mouth daily. Patient not taking: Reported on 11/07/2017 07/20/17   [name], DO  thiamine 100 MG tablet Take 1 tablet (100 mg total) by mouth daily. Patient not taking: Reported on 11/07/2017 07/27/17   [name], MD    Inpatient Medications: Scheduled Meds: . amLODipine  10 mg Oral Daily  . aspirin EC  81 mg Oral Daily  . atorvastatin  10 mg Oral Daily  . dextromethorphan-guaiFENesin  1 tablet Oral BID  . heparin injection  (subcutaneous)  5,000 Units Subcutaneous Q8H  . losartan  50 mg Oral Daily  . tamsulosin  0.4 mg Oral Daily   Continuous Infusions: . sodium chloride 75 mL/hr at 11/08/17 0339   PRN Meds: hydrALAZINE  Allergies:   No Known Allergies  Social History:   Social History   Socioeconomic History  . Marital status: Divorced    Spouse name: Not on file  . Number of children: 2  . Years of education: Not on file  . Highest education level: Not on file  Occupational History  . Not on file  Social Needs  . Financial resource strain: Not on file  . Food insecurity:    Worry: Not on file    Inability: Not on file  . Transportation needs:    Medical: Not on file    Non-medical: Not on file  Tobacco Use  . Smoking status: Former Smoker    Packs/day: 0.12    Years: 45.00    Pack years: 5.40    Types: Cigars, Cigarettes    Last attempt to quit: 10/19/2016    Years since quitting: 1.0  . Smokeless tobacco: Never Used  Substance and Sexual Activity  . Alcohol use: Yes    Alcohol/week: 1.8 oz    Types: 3 Standard drinks or equivalent per week  . Drug use: No  . Sexual activity: Never  Lifestyle  . Physical activity:    Days per week: Not on file    Minutes per session: Not on file  . Stress: Not on file  Relationships  . Social connections:    Talks on phone: Not on file    Gets together: Not on file    Attends religious service: Not on file    Active member of club or organization: Not on file    Attends meetings of clubs or organizations: Not on file    Relationship status: Not on file  . Intimate partner violence:    Fear of current or ex partner: Not on file    Emotionally abused: Not on file    Physically abused: Not on file    Forced sexual activity: Not on file  Other Topics Concern  . Not on file  Social History Narrative  . Not on file    Family History:    Family History  Problem Relation Age of Onset  . Heart attack Father      ROS:  Please see the  history of present illness.   All other ROS reviewed and negative.     Physical Exam/Data:   Vitals:   11/07/17 2309 11/08/17 0527 11/08/17 0608 11/08/17 0830  BP: (!) 165/88 (!) 144/97  (!) 157/84  Pulse: 64 72    Resp: 18 20    Temp: 98.5 F (36.9 C) 99.3 F (37.4 C)    TempSrc: Oral Oral    SpO2: 96% 100%    Weight:  Height:   6\' 2"  (1.88 m)     Intake/Output Summary (Last 24 hours) at 11/08/2017 1409 Last data filed at 11/08/2017 1326 Gross per 24 hour  Intake 925.65 ml  Output 3730 ml  Net -2804.35 ml   Filed Weights   11/07/17 1725  Weight: 192 lb 3.9 oz (87.2 kg)   Body mass index is 24.68 kg/m.  General:  Well nourished, well developed, in no acute distress HEENT: normal Neck: no JVD Vascular: No carotid bruits Cardiac:  normal S1, S2; RRR; soft systolic murmur Lungs:  clear to auscultation bilaterally, no wheezing, rhonchi or rales, diminished throughout Abd: soft, nontender, no hepatomegaly  Ext: no edema Musculoskeletal:  No deformities, BUE and BLE strength normal and equal Skin: warm and dry  Neuro:  CNs 2-12 intact, no focal abnormalities noted Psych:  Normal affect   EKG:  The EKG was personally reviewed and demonstrates:  Sinus, TWI inferior leads and V5/6 - unchanged from prior Telemetry:  Telemetry was personally reviewed and demonstrates:  Sinus, occasional PACs  Relevant CV Studies:  Echo 07/18/17: Study Conclusions - Left ventricle: The cavity size was normal. There was mild   concentric hypertrophy. Systolic function was normal. The   estimated ejection fraction was in the range of 55% to 60%. Wall   motion was normal; there were no regional wall motion   abnormalities. Doppler parameters are consistent with abnormal   left ventricular relaxation (grade 1 diastolic dysfunction). - Aortic valve: There was mild regurgitation. - Left atrium: The atrium was mildly dilated. - Right ventricle: The cavity size was normal. Wall thickness  was   increased. - Right atrium: The atrium was mildly dilated.  Impressions: - The left ventriuclar myocardium is hyperechogenic and right   ventricular wall thicknes is also increased. Consider an   infiltrative process, such as amyloidosis.   Echo 04/06/13: Study Conclusions  Left ventricle: The cavity size was moderately dilated. There was hypertrophy, with an appearance of eccentric hypertrophy. Systolic function was normal. The estimated ejection fraction was 55%, in the range of 55% to 60%. Wall motion was normal; there were no regional wall motion abnormalities. Doppler parameters are consistent with abnormal left ventricular relaxation (grade 1 diastolic dysfunction). Doppler parameters are consistent with both elevated ventricular end-diastolic filling pressure and elevated left atrial filling pressure. Apical false tendon is noted. - Aortic valve: Mildly calcified annulus. Trileaflet; normal thickness, mildly calcified leaflets. Mild focal calcification, nodularity, and sclerosis without stenosis. Mild regurgitation. - Left atrium: The atrium was mildly to moderately dilated. - Atrial septum: No defect or patent foramen ovale was identified.   Laboratory Data:  Chemistry Recent Labs  Lab 11/07/17 1257 11/08/17 0536  NA 139 141  K 3.6 3.7  CL 102 108  CO2 29 28  GLUCOSE 147* 89  BUN 21* 14  CREATININE 1.61* 1.26*  CALCIUM 8.6* 8.5*  GFRNONAA 43* 57*  GFRAA 49* >60  ANIONGAP 8 5    Recent Labs  Lab 11/07/17 1257 11/08/17 0536  PROT 6.7 6.1*  ALBUMIN 3.2* 3.0*  AST 31 22  ALT 18 18  ALKPHOS 118 114  BILITOT 0.5 0.8   Hematology Recent Labs  Lab 11/07/17 1257 11/08/17 0536  WBC 4.2 3.4*  RBC 4.40 4.44  HGB 11.2* 11.4*  HCT 37.6* 37.0*  MCV 85.5 83.3  MCH 25.5* 25.7*  MCHC 29.8* 30.8  RDW 15.4 15.3  PLT 167 163   Cardiac Enzymes Recent Labs  Lab 11/08/17 0901  TROPONINI 0.05*  Recent Labs  Lab  11/07/17 1303  TROPIPOC 0.01    BNPNo results for input(s): BNP, PROBNP in the last 168 hours.  DDimer No results for input(s): DDIMER in the last 168 hours.  Radiology/Studies:  Dg Chest 2 View  Result Date: 11/07/2017 CLINICAL DATA:  Syncopal episode today, confusion EXAM: CHEST - 2 VIEW COMPARISON:  Chest x-ray of 07/18/2016 FINDINGS: No active infiltrate or effusion is seen. Mediastinal and hilar contours are stable and borderline cardiomegaly is stable. No acute bony abnormality is seen. IMPRESSION: No active lung disease.  Stable borderline cardiomegaly. Electronically Signed   By: Ivar Drape M.D.   On: 11/07/2017 14:35   Ct Head Wo Contrast  Result Date: 11/07/2017 CLINICAL DATA:  Altered level of consciousness.  Syncope EXAM: CT HEAD WITHOUT CONTRAST TECHNIQUE: Contiguous axial images were obtained from the base of the skull through the vertex without intravenous contrast. COMPARISON:  CT 07/17/2017 FINDINGS: Brain: Ventricle size normal. Moderate chronic microvascular ischemic changes in the white matter and basal ganglia bilaterally, as noted previously. Negative for acute infarct. Negative for acute hemorrhage mass or edema. No midline shift. Vascular: Negative for hyperdense vessel Skull: Negative Sinuses/Orbits: Mild mucosal edema paranasal sinuses.  Normal orbit Other: None IMPRESSION: No acute abnormality. Moderate chronic microvascular ischemic changes unchanged from prior studies. Electronically Signed   By: Franchot Gallo M.D.   On: 11/07/2017 14:27    Assessment and Plan:   1. Syncope, PACs - nonobstructive carotid artery disease stable from 06/2017 - no arrhythmias detected on telemetry or previous strips and EKGs, some PACs on telemetry - will check orthostatic vitals - he has a cardiology appt on Monday that he intends to keep - he carries a previous diagnosis of NSVT, but has not had this since admission, no EKG evidence of this   2. Chronic diastolic heart  failure - pt is euvolemic on exam and on room air - CXR with stable cardiomegaly   3. Abnormal Echo with evidence of possible amyloidosis vs hypertensive LVH - echo 06/2017 with right ventricular wall thickened, suggestive of amyloidosis -  Pt will need to avoid dehydration   4. CKD stage III - sCr 1.26, below his baseline   5. Elevated troponin - troponin 0.05 --> 0.05 - he denies chest pain, EKG is stable from prior - no ischemic workup during this hospitalization planned   6. HTN - home norvasc and losartan - pressure elevated prior to medications   7. HLD - on 10 mg lipitor - 07/18/2017: Cholesterol 200; HDL 65; LDL Cholesterol 124; Triglycerides 56; VLDL 11  - recheck lipid profile - ordered for tomorrow morning   Given his recent stroke and now syncopal episode, he would benefit from evaluation of his rhythm with either a holter monitor or loop recorder. His homeless/shelter status may make a loop recorder difficult, but is getting a loop recorder. He is on 2 hypertensive medications and flomax and has evidence of LVH. He may be more susceptible to dehydration and orthostasis. Will recommend keeping his outpatient appt on Monday for evaluation.     For questions or updates, please contact [facility] Please consult [email or web address] for contact info under Cardiology/STEMI.   Signed, Ledora Bottcher, PA  11/08/2017 2:09 PM   I have seen and examined the patient along with Ledora Bottcher, PA.  I have reviewed the chart, notes and new data.  I agree with PA/NP's note.  Key new complaints: syncopal event sounds most consistent with a hypotensive  event (heat, relative dehydration, vasodilator antihypertensives, alpha blocker for prostatism), but cannot entirely exclude ventricular arrhythmia due to his underlying structural abnormalities. H Key examination changes: S4 present, no murmurs, no edema/rales, RRR. Vitiligo. Key new findings / data: elevated creatinine is  improving. ECG unchanged from baseline. Echo reviewed.  PLAN: I think he would really benefit from an implantable loop recorder for 2 reasons: potential identification of atrial fibrillation as a cause of his previous CVA and evaluation of an arrhythmic cause for syncope. This would pose impossible challenges while he is homeless, but he tells me that he will soon be living in an apartment.  I tried to organize the loop implantation today, but could not set it up due to the late hour. We can do that as an outpatient next week.   Avoid heat and stay well hydrated. Keep follow up appointment with Dr. Debara Reyes on Monday.  Sanda Klein, MD, [place] [phone] 11/08/2017, 4:41 PM

## 2017-11-08 NOTE — Progress Notes (Signed)
*  PRELIMINARY RESULTS* Vascular Ultrasound Carotid Duplex (Doppler) has been completed.  Findings suggest 1-39% internal carotid artery stenosis bilaterally. Vertebral arteries are patent with antegrade flow.  11/08/2017 12:30 PM Maudry Mayhew, BS, RVT, RDCS, RDMS

## 2017-11-08 NOTE — Progress Notes (Signed)
EEG completed; results pending.    

## 2017-11-08 NOTE — Progress Notes (Signed)
Nutrition Brief Note  RD consulted for assessment of nutritional status and needs.   Wt Readings from Last 15 Encounters:  11/07/17 192 lb 3.9 oz (87.2 kg)  07/25/17 179 lb (81.2 kg)  07/18/17 174 lb 13.2 oz (79.3 kg)  04/03/13 207 lb (93.9 kg)  03/19/13 207 lb 11.2 oz (94.2 kg)   Isaac Reyes is a 67 y.o. male with medical history significant for CVA with residual dysarthria, nonsustained ventricular tachycardia, hypertension, chronic kidney disease stage III, hyperlipidemia, who presented to ED [facility] after a witnessed syncopal episode.   Pt admitted with syncope.   Spoke with pt at bedside, who denies any complaints other than being eager to go home. He reports he typically has a good appetite; consumed most of his breakfast meal of french toast this morning. PTA, he was consuming 3 meals per day- Breakfast: grits and eggs, Lunch: sandwich and fries, Dinner: meat, starch, vegetable, and dessert from [employer]. Pt denies any changes in his eating habits or patterns.   He denies any weight loss. He shares UBW around 190#. Verified wt on bedscale (88.6 kg or 194#). He reports that he feels a little weaker and has less energy since suffering a stroke back in March 2019. He denies any limitations with ADLS or swallowing.   Nutrition-Focused physical exam completed. Findings are no fat depletion, mild muscle depletion, and no edema. Mild muscle depletion noted in lower extremities, however, pt denies any changes in physical appearance. Suspect minor muscle depletion may be related to residual side effects from stroke.   Discussed importance of good meal intake to promote healing. Pt denies any further nutritional related needs at this time.    Albumin has a half-life of 21 days and is strongly affected by stress response and inflammatory process, therefore, do not expect to see an improvement in this lab value during acute hospitalization. When a patient presents with low albumin, it is likely  skewed due to the acute inflammatory response.  Unless it is suspected that patient had poor PO intake or malnutrition prior to admission, then RD should not be consulted solely for low albumin. Note that low albumin is no longer used to diagnose malnutrition; [place] uses the new malnutrition guidelines published by the American Society for Parenteral and Enteral Nutrition (A.S.P.E.N.) and the Academy of Nutrition and Dietetics (AND).    Labs reviewed.   Body mass index is 24.68 kg/m. Patient meets criteria for normal weight range based on current BMI.   Current diet order is Heart Healthy, patient is consuming approximately 75% of meals at this time. Labs and medications reviewed.   No nutrition interventions warranted at this time. If nutrition issues arise, please consult RD.   [name], RD, LDN, CDE Pager: [phone] After hours Pager: [phone]

## 2017-11-09 ENCOUNTER — Inpatient Hospital Stay (HOSPITAL_COMMUNITY): Payer: Medicare Other

## 2017-11-09 DIAGNOSIS — F1010 Alcohol abuse, uncomplicated: Secondary | ICD-10-CM

## 2017-11-09 DIAGNOSIS — R296 Repeated falls: Secondary | ICD-10-CM

## 2017-11-09 DIAGNOSIS — R509 Fever, unspecified: Secondary | ICD-10-CM

## 2017-11-09 DIAGNOSIS — I10 Essential (primary) hypertension: Secondary | ICD-10-CM

## 2017-11-09 DIAGNOSIS — R9431 Abnormal electrocardiogram [ECG] [EKG]: Secondary | ICD-10-CM

## 2017-11-09 DIAGNOSIS — D649 Anemia, unspecified: Secondary | ICD-10-CM

## 2017-11-09 LAB — BASIC METABOLIC PANEL
Anion gap: 6 (ref 5–15)
BUN: 16 mg/dL (ref 6–20)
CHLORIDE: 103 mmol/L (ref 101–111)
CO2: 28 mmol/L (ref 22–32)
Calcium: 8.1 mg/dL — ABNORMAL LOW (ref 8.9–10.3)
Creatinine, Ser: 1.26 mg/dL — ABNORMAL HIGH (ref 0.61–1.24)
GFR calc Af Amer: >60 mL/min (ref >60)
GFR calc non Af Amer: 57 mL/min — ABNORMAL LOW (ref >60)
GLUCOSE: 102 mg/dL — AB (ref 65–99)
POTASSIUM: 3.7 mmol/L (ref 3.5–5.1)
Sodium: 137 mmol/L (ref 135–145)

## 2017-11-09 LAB — LIPID PANEL
CHOL/HDL RATIO: 2.2 ratio
Cholesterol: 141 mg/dL (ref 0–200)
HDL: 64 mg/dL (ref >40)
LDL Cholesterol: 69 mg/dL (ref 0–99)
TRIGLYCERIDES: 40 mg/dL (ref <150)
VLDL: 8 mg/dL (ref 0–40)

## 2017-11-09 MED ORDER — TAMSULOSIN HCL 0.4 MG PO CAPS: 0.4000 mg | ORAL_CAPSULE | Freq: Every day | ORAL | Status: DC

## 2017-11-09 MED ORDER — DM-GUAIFENESIN ER 30-600 MG PO TB12: 1.0000 | ORAL_TABLET | Freq: Two times a day (BID) | ORAL | 0 refills | Status: DC

## 2017-11-09 NOTE — Progress Notes (Signed)
Pt ambulated in room to bathroom and and is sitting up in chair.  He tolerated othostatic BP's.  Temperature is down.  No re[ports of fevers.  Pt. Is anxious to be discharged today.

## 2017-11-09 NOTE — Discharge Summary (Signed)
Physician Discharge Summary  Isaac Reyes KNL:[id number] DOB: 09/22/1950 DOA: 11/07/2017  PCP: [name], MD  Admit date: 11/07/2017 Discharge date: 11/09/2017  Admitted From: shelter. Disposition:  [facility]  Recommendations for Outpatient Follow-up:  1. Follow up with PCP in 1-2 weeks 2. Please obtain BMP/CBC in one week 3. Please follow up with cardiology on Monday for loop recorder placement.     Discharge Condition:Stable.  CODE STATUS: DNR Diet recommendation: Heart Healthy  Brief/Interim Summary: Isaac Reyes a 67 y.o.malewith medical history significant forCVA with residual dysarthria, nonsustained ventricular tachycardia, hypertension, chronic kidney disease stage III,hyperlipidemia, who presented to ED [facility] after a witnessed syncopal episode. Per witnesses patient was shaking prior to his fall and was out for about 1 minute. He was admitted to medical service for evaluation of syncope.     Discharge Diagnoses:  Active Problems:   Abnormal EKG   NSVT (nonsustained ventricular tachycardia) (HCC)   Normocytic normochromic anemia   Malnutrition of moderate degree   Frequent falls   Renal insufficiency   Alcohol abuse   CVA (cerebral vascular accident) ([place])   Benign essential HTN   Syncope     Syncope:  Differential include cardiac, vs orthostatic hypotension vs seizures vs from dehydration.  Monitor on telemetry for evaluation of arrhythmias.  EKG on admission, shows sinus rhythm with abnormal t wave inversions in the lateral leads similar to the EKG from march 2019.  We have a recent echocardiogram from march 2019, showing good LVEF , no regional wall motion abn, and grade 1 diastolic dysfunction and hyperechogenic left ventricular myocardium, and to consider infiltrative process like amyloidosis.  He had a nuclear stress test in 2014, showing moderate non ischemic cardiomyopathy. He currently denies any chest pain or sob.  Mildly elevated  troponins, but pt denies any chest pain.   Negative orthostatic blood pressure measurements.  EEG to rule out seizures as he had recent CVA. CT head without contrast on admission negative for new stroke.  Continue with aspirin for now.  Get PT /OT evaluation.  Cardiology consulted recommended loop recorder vs holter monitor.  Recommended outpatient follow up with cardiology on Monday.    Fever last night CXR neg, no urinary symptoms, UA neg on admission. No skin lesions.  Fever resolved.     H/o recent CVA: Lipid panel reviewed.  and resume aspirin and lipitor.      BPH; Resume flomax.    Acute on Stage 3 CKD: Creatinine was elevated on admission, which has improved with fluids, creatinine is  back to baseline which suggests pt is probably dehydrated.     Hyperlipidemia: Resume lipitor.    H/o NSVT: None since admission. Not on BB.  Monitor.    Mild normocytic anemia: Hemoglobin stable around 11.    H/o alcohol abuse:  Alcohol level on admission less than 10.  Monitor for signs of withdrawal.       Discharge Instructions  Discharge Instructions    Diet - low sodium heart healthy   Complete by:  As directed    Discharge instructions   Complete by:  As directed    Please follow up with cardiology as recommended on Monday for loop recorder placement.     Allergies as of 11/09/2017   No Known Allergies     Medication List    STOP taking these medications   thiamine 100 MG tablet     TAKE these medications   amLODipine 10 MG tablet Commonly known as:  NORVASC Take 1 tablet (10 mg total) by mouth daily.   ASPIRIN LOW DOSE 81 MG EC tablet Generic drug:  aspirin Take 81 mg by mouth daily. What changed:  Another medication with the same name was removed. Continue taking this medication, and follow the directions you see here.   atorvastatin 10 MG tablet Commonly known as:  LIPITOR Take 10 mg by mouth daily.    dextromethorphan-guaiFENesin 30-600 MG 12hr tablet Commonly known as:  MUCINEX DM Take 1 tablet by mouth 2 (two) times daily.   losartan 50 MG tablet Commonly known as:  COZAAR Take 50 mg by mouth daily.   tamsulosin 0.4 MG Caps capsule Commonly known as:  FLOMAX Take 0.4 mg by mouth daily.      Follow-up Information    [name], MD. Schedule an appointment as soon as possible for a visit in 1 week(s).   Specialty:  Internal Medicine Contact information: [address] [place] [address] [phone]        Pixie Casino, MD .   Specialty:  Cardiology Contact information: [place] [address] [phone]          No Known Allergies  Consultations:  Cardiology.    Procedures/Studies: Dg Chest 2 View  Result Date: 11/07/2017 CLINICAL DATA:  Syncopal episode today, confusion EXAM: CHEST - 2 VIEW COMPARISON:  Chest x-ray of 07/18/2016 FINDINGS: No active infiltrate or effusion is seen. Mediastinal and hilar contours are stable and borderline cardiomegaly is stable. No acute bony abnormality is seen. IMPRESSION: No active lung disease.  Stable borderline cardiomegaly. Electronically Signed   By: Ivar Drape M.D.   On: 11/07/2017 14:35   Ct Head Wo Contrast  Result Date: 11/07/2017 CLINICAL DATA:  Altered level of consciousness.  Syncope EXAM: CT HEAD WITHOUT CONTRAST TECHNIQUE: Contiguous axial images were obtained from the base of the skull through the vertex without intravenous contrast. COMPARISON:  CT 07/17/2017 FINDINGS: Brain: Ventricle size normal. Moderate chronic microvascular ischemic changes in the white matter and basal ganglia bilaterally, as noted previously. Negative for acute infarct. Negative for acute hemorrhage mass or edema. No midline shift. Vascular: Negative for hyperdense vessel Skull: Negative Sinuses/Orbits: Mild mucosal edema paranasal sinuses.  Normal orbit Other: None IMPRESSION: No  acute abnormality. Moderate chronic microvascular ischemic changes unchanged from prior studies. Electronically Signed   By: Franchot Gallo M.D.   On: 11/07/2017 14:27   Dg Chest Port 1 View  Result Date: 11/09/2017 CLINICAL DATA:  Dizzy spells and fever. EXAM: PORTABLE CHEST 1 VIEW COMPARISON:  11/07/2017 FINDINGS: Single view of the chest was obtained. The lungs are clear. Negative for a pneumothorax. Heart and mediastinum are within normal limits. Trachea is midline. Bone structures are unremarkable. IMPRESSION: No active disease. Electronically Signed   By: Markus Daft M.D.   On: 11/09/2017 14:35       Subjective:  No chest pain or sob.  Discharge Exam: Vitals:   11/08/17 2251 11/09/17 1451  BP: (!) 156/83   Pulse: (!) 43   Resp: 18   Temp: (!) 101.4 F (38.6 C) 98 F (36.7 C)  SpO2: 100%    Vitals:   11/08/17 2106 11/08/17 2117 11/08/17 2251 11/09/17 1451  BP: (!) 137/114 (!) 145/91 (!) 156/83   Pulse: 95 71 (!) 43   Resp: 18  18   Temp:   (!) 101.4 F (38.6 C) 98 F (36.7 C)  TempSrc:   Oral Oral  SpO2: 100%  100%   Weight:      Height:        General: Pt is alert, awake, not in acute distress Cardiovascular: RRR, S1/S2 +, no rubs, no gallops Respiratory: CTA bilaterally, no wheezing, no rhonchi Abdominal: Soft, NT, ND, bowel sounds + Extremities: no edema, no cyanosis    The results of significant diagnostics from this hospitalization (including imaging, microbiology, ancillary and laboratory) are listed below for reference.     Microbiology: No results found for this or any previous visit (from the past 240 hour(s)).   Labs: BNP (last 3 results) No results for input(s): BNP in the last 8760 hours. Basic Metabolic Panel: Recent Labs  Lab 11/07/17 1257 11/08/17 0536 11/09/17 1212  NA 139 141 137  K 3.6 3.7 3.7  CL 102 108 103  CO2 29 28 28   GLUCOSE 147* 89 102*  BUN 21* 14 16  CREATININE 1.61* 1.26* 1.26*  CALCIUM 8.6* 8.5* 8.1*   Liver  Function Tests: Recent Labs  Lab 11/07/17 1257 11/08/17 0536  AST 31 22  ALT 18 18  ALKPHOS 118 114  BILITOT 0.5 0.8  PROT 6.7 6.1*  ALBUMIN 3.2* 3.0*   No results for input(s): LIPASE, AMYLASE in the last 168 hours. No results for input(s): AMMONIA in the last 168 hours. CBC: Recent Labs  Lab 11/07/17 1257 11/08/17 0536  WBC 4.2 3.4*  HGB 11.2* 11.4*  HCT 37.6* 37.0*  MCV 85.5 83.3  PLT 167 163   Cardiac Enzymes: Recent Labs  Lab 11/08/17 0901 11/08/17 1409 11/08/17 2015  TROPONINI 0.05* 0.05* 0.06*   BNP: Invalid input(s): POCBNP CBG: Recent Labs  Lab 11/07/17 1346  GLUCAP 107*   D-Dimer No results for input(s): DDIMER in the last 72 hours. Hgb A1c No results for input(s): HGBA1C in the last 72 hours. Lipid Profile Recent Labs    11/09/17 0410  CHOL 141  HDL 64  LDLCALC 69  TRIG 40  CHOLHDL 2.2   Thyroid function studies No results for input(s): TSH, T4TOTAL, T3FREE, THYROIDAB in the last 72 hours.  Invalid input(s): FREET3 Anemia work up No results for input(s): VITAMINB12, FOLATE, FERRITIN, TIBC, IRON, RETICCTPCT in the last 72 hours. Urinalysis    Component Value Date/Time   COLORURINE YELLOW (A) 11/07/2017 1612   APPEARANCEUR CLEAR (A) 11/07/2017 1612   LABSPEC <1.005 (L) 11/07/2017 1612   PHURINE 7.0 11/07/2017 1612   GLUCOSEU NEGATIVE 11/07/2017 1612   HGBUR NEGATIVE 11/07/2017 1612   BILIRUBINUR NEGATIVE 11/07/2017 1612   KETONESUR NEGATIVE 11/07/2017 1612   PROTEINUR NEGATIVE 11/07/2017 1612   NITRITE NEGATIVE 11/07/2017 1612   LEUKOCYTESUR NEGATIVE 11/07/2017 1612   Sepsis Labs Invalid input(s): PROCALCITONIN,  WBC,  LACTICIDVEN Microbiology No results found for this or any previous visit (from the past 240 hour(s)).   Time coordinating discharge: 32 minutes  SIGNED:   Hosie Poisson, MD  [facility] 11/09/2017, 2:58 PM Pager   If 7PM-7AM, please contact night-coverage [email or web address] Password [email or web address]

## 2017-11-09 NOTE — Progress Notes (Signed)
OT Cancellation Note  Patient Details Name: Isaac Reyes MRN: [id number] DOB: December 22, 1950   Cancelled Treatment:    Reason Eval/Treat Not Completed: Medical issues which prohibited therapy. Troponin 0.06. Per note, it was 0.05 on 6/21.  Benito Mccreedy OTR/L 11/09/2017, 9:56 AM

## 2017-11-09 NOTE — Progress Notes (Signed)
CSW spoke with Glenard Haring at [facility] concerning pt's return to [facility].  Pt is fine to return.  CSW will assist with transportation (taxi voucher).    Reed Breech LCSWA [phone]

## 2017-11-11 ENCOUNTER — Encounter: Payer: Self-pay | Admitting: *Deleted

## 2017-11-11 ENCOUNTER — Ambulatory Visit (INDEPENDENT_AMBULATORY_CARE_PROVIDER_SITE_OTHER): Payer: Medicare Other | Admitting: Physician Assistant

## 2017-11-11 ENCOUNTER — Encounter: Payer: Self-pay | Admitting: Physician Assistant

## 2017-11-11 ENCOUNTER — Other Ambulatory Visit: Payer: Self-pay | Admitting: Physician Assistant

## 2017-11-11 VITALS — BP 126/74 | HR 69 | Ht 74.0 in | Wt 193.0 lb

## 2017-11-11 DIAGNOSIS — R55 Syncope and collapse: Secondary | ICD-10-CM | POA: Diagnosis not present

## 2017-11-11 DIAGNOSIS — I10 Essential (primary) hypertension: Secondary | ICD-10-CM | POA: Diagnosis not present

## 2017-11-11 DIAGNOSIS — R5383 Other fatigue: Secondary | ICD-10-CM | POA: Diagnosis not present

## 2017-11-11 DIAGNOSIS — I472 Ventricular tachycardia: Secondary | ICD-10-CM

## 2017-11-11 DIAGNOSIS — I4729 Other ventricular tachycardia: Secondary | ICD-10-CM

## 2017-11-11 DIAGNOSIS — I639 Cerebral infarction, unspecified: Secondary | ICD-10-CM

## 2017-11-11 DIAGNOSIS — R531 Weakness: Secondary | ICD-10-CM

## 2017-11-11 LAB — TSH: TSH: 0.975 u[IU]/mL (ref 0.450–4.500)

## 2017-11-11 NOTE — Progress Notes (Signed)
Cardiology Office Note   Date:  11/11/2017   ID:  Isaac Reyes, DOB [birth year], MRN [id number]  PCP:  [name], MD  Cardiologist: Dr. Debara Pickett, 02/2013 Rosaria Ferries, PA-C    History of Present Illness: Isaac Reyes is a 67 y.o. male with a history of  lupus, HLD, CVA (06/2017), and CKD stage III, neg MV 2014, nl EF echo 2014, carotid Dopplers 06/2017 with 1-39% bilateral disease, systolic murmur  Admitted 6/20- 11/09/2017 for syncope, seen by cardiology, carotid Dopplers unchanged, EEG normal, loop recorder recommended as an outpatient  [name] presents for cardiology follow up.  He says he normally drinks water ok, but just didn't want it the day he passed out. He is better at drinking water now. He does not like the idea of passing out, willing to try to prevent it. He passed out once before, did not tell anyone.   Never has had chest pain.   No palpitations, did not feel premature beats that I heard on exam.   No presyncope or syncope since discharge.    Says he had thyroid surgery as a child.   Wonders why he feels so tired all the time. Wonders if his thyroid is the cause.   Worries a little about the lupus, the only way it bothers him is his skin.   No LE edema, no orthopnea or PND. No DOE.    Past Medical History:  Diagnosis Date  . Acute CVA (cerebrovascular accident) ([place]) 06/2017   "speech issues since" (07/24/2017)  . AKI (acute kidney injury) ([place]) 07/24/2017  . Anemia   . High cholesterol   . Lupus ([place])    "skin kind"  . NSVT (nonsustained ventricular tachycardia) (HCC)     Past Surgical History:  Procedure Laterality Date  . KNEE ARTHROSCOPY    . TONSILLECTOMY      Current Outpatient Medications  Medication Sig Dispense Refill  . amLODipine (NORVASC) 10 MG tablet Take 1 tablet (10 mg total) by mouth daily. 30 tablet 0  . ASPIRIN LOW DOSE 81 MG EC tablet Take 81 mg by mouth daily.  3  . atorvastatin (LIPITOR) 10 MG tablet  Take 10 mg by mouth daily.  3  . dextromethorphan-guaiFENesin (MUCINEX DM) 30-600 MG 12hr tablet Take 1 tablet by mouth 2 (two) times daily. 10 tablet 0  . losartan (COZAAR) 50 MG tablet Take 50 mg by mouth daily.  3  . tamsulosin (FLOMAX) 0.4 MG CAPS capsule Take 0.4 mg by mouth daily.  11   No current facility-administered medications for this visit.     Allergies:   Patient has no known allergies.    Social History:  The patient  reports that he quit smoking about 12 months ago. His smoking use included cigars and cigarettes. He has a 5.40 pack-year smoking history. He has never used smokeless tobacco. He reports that he drinks about 1.8 oz of alcohol per week. He reports that he does not use drugs.   Family History:  The patient's family history includes Heart attack in his father.    ROS:  Please see the history of present illness. All other systems are reviewed and negative.    PHYSICAL EXAM: VS:  BP 126/74   Pulse 69   Ht 6\' 2"  (1.88 m)   Wt 193 lb (87.5 kg)   BMI 24.78 kg/m  , BMI Body mass index is 24.78 kg/m. GEN: Well nourished, well developed, male in no acute distress  HEENT: normal for age  Neck: mild JVD, no carotid bruit, no masses Cardiac: RRR; no murmur, no rubs, or gallops Respiratory:  clear to auscultation bilaterally, normal work of breathing GI: soft, nontender, nondistended, + BS MS: no deformity or atrophy; no edema; distal pulses are 2+ in all 4 extremities   Skin: warm and dry, no rash, areas of vitiligo noted Neuro:  Strength and sensation are intact Psych: euthymic mood, full affect  Orthostatic VS Position BP HR  Lying 122/70 70  Sitting 130/70 70  Standing 122/76 72  Standing at 3"           EKG:  EKG is not ordered today.   Echo 07/18/17: Study Conclusions - Left ventricle: The cavity size was normal. There was mild concentric hypertrophy. Systolic function was normal. The estimated ejection fraction was in the range of 55% to  60%. Wall motion was normal; there were no regional wall motion abnormalities. Doppler parameters are consistent with abnormal left ventricular relaxation (grade 1 diastolic dysfunction). - Aortic valve: There was mild regurgitation. - Left atrium: The atrium was mildly dilated. - Right ventricle: The cavity size was normal. Wall thickness was increased. - Right atrium: The atrium was mildly dilated. Impressions: - The left ventriuclar myocardium is hyperechogenic and right ventricular wall thicknes is also increased. Consider an infiltrative process, such as amyloidosis.   Recent Labs: 07/19/2017: Magnesium 2.4 11/08/2017: ALT 18; Hemoglobin 11.4; Platelets 163 11/09/2017: BUN 16; Creatinine, Ser 1.26; Potassium 3.7; Sodium 137    Lipid Panel    Component Value Date/Time   CHOL 141 11/09/2017 0410   TRIG 40 11/09/2017 0410   HDL 64 11/09/2017 0410   CHOLHDL 2.2 11/09/2017 0410   VLDL 8 11/09/2017 0410   LDLCALC 69 11/09/2017 0410     Wt Readings from Last 3 Encounters:  11/11/17 193 lb (87.5 kg)  11/07/17 192 lb 3.9 oz (87.2 kg)  07/25/17 179 lb (81.2 kg)     Other studies Reviewed: Additional studies/ records that were reviewed today include: office notes, hospital records and testing.  ASSESSMENT AND PLAN:  1.  Syncope: reported hx NSVT, no hx palpitations. Pt unaware of irreg HR, sounds like premature beats at times. PACs seen on telemetry review from recent hospitalization.  - due to social issues and rarity of events, ILR is best option.  - we will arrange this on Thursday w/ Dr Sallyanne Kuster - Dr Sallyanne Kuster reviewed this w/ pt, he is in agreement  2. HTN: good control on current rx  3. Fatigue, weakness: ongoing problem, no explanation based on telemetry (no sig bradycardia) or meds (no rate-lowering rx).  - pt reports thyroid surgery as a child>>ck TSH   Current medicines are reviewed at length with the patient today.  The patient does not have  concerns regarding medicines.  The following changes have been made:  no change  Labs/ tests ordered today include:  Orders Placed This Encounter  Procedures  . TSH     Disposition:   FU with Dr. Debara Pickett  Signed, Rosaria Ferries, PA-C  11/11/2017 11:15 AM    [place] Phone: [phone]; Fax: [phone]  This note was written with the assistance of speech recognition software. Please excuse any transcriptional errors.

## 2017-11-11 NOTE — Patient Instructions (Signed)
Medication Instructions:   Your physician recommends that you continue on your current medications as directed. Please refer to the Current Medication list given to you today.   If you need a refill on your cardiac medications before your next appointment, please call your pharmacy.  Labwork: TSH TODAY    Testing/Procedures:  SEE LETTER FOR LOOP INSERTION    Follow-Up:  3 MONTHS WITH DR HILTY    Any Other Special Instructions Will Be Listed Below (If Applicable).

## 2017-11-11 NOTE — Progress Notes (Signed)
Scheduled for Thursday MCr

## 2017-11-11 NOTE — Progress Notes (Signed)
The loop recorder implantation procedure has been fully reviewed with the patient and written informed consent has been obtained.

## 2017-11-12 ENCOUNTER — Encounter: Payer: Self-pay | Admitting: Pediatric Intensive Care

## 2017-11-14 ENCOUNTER — Ambulatory Visit (HOSPITAL_COMMUNITY): Admission: RE | Admit: 2017-11-14 | Payer: Medicare Other | Source: Ambulatory Visit | Admitting: Cardiovascular Disease

## 2017-11-14 ENCOUNTER — Encounter (HOSPITAL_COMMUNITY): Admission: RE | Payer: Self-pay | Source: Ambulatory Visit

## 2017-11-14 SURGERY — LOOP RECORDER INSERTION

## 2017-11-14 MED ORDER — LIDOCAINE-EPINEPHRINE 1 %-1:100000 IJ SOLN
INTRAMUSCULAR | Status: AC
  Filled 2017-11-14: qty 1

## 2017-11-15 ENCOUNTER — Encounter: Payer: Self-pay | Admitting: Pediatric Intensive Care

## 2017-11-19 ENCOUNTER — Encounter: Payer: Self-pay | Admitting: Pediatric Intensive Care

## 2017-11-20 DIAGNOSIS — I10 Essential (primary) hypertension: Secondary | ICD-10-CM | POA: Diagnosis not present

## 2017-11-20 DIAGNOSIS — R55 Syncope and collapse: Secondary | ICD-10-CM | POA: Diagnosis not present

## 2017-11-25 ENCOUNTER — Encounter: Payer: Self-pay | Admitting: Pediatric Intensive Care

## 2017-11-26 NOTE — Congregational Nurse Program (Signed)
Congregational Nurse Program Note  Date of Encounter: 10/22/2017  Past Medical History: Past Medical History:  Diagnosis Date  . Acute CVA (cerebrovascular accident) ([place]) 06/2017   "speech issues since" (07/24/2017)  . AKI (acute kidney injury) ([place]) 07/24/2017  . Anemia   . High cholesterol   . Lupus ([place])    "skin kind"  . NSVT (nonsustained ventricular tachycardia) ([place])     Encounter Details: CNP Questionnaire - 11/08/17 1015      Questionnaire   Patient Status  Not Applicable    Race  Black or African American    Location Patient [place]  Medicare    Uninsured  Not Applicable    Food  No food insecurities    Housing/Utilities  No permanent housing    Transportation  Yes, need transportation assistance    Interpersonal Safety  No, do not feel physically and emotionally safe where you currently live    Medication  Yes, have medication insecurities    Medical Provider  Yes    Referrals  Primary Care Provider/Clinic;Other    ED Visit Averted  Not Applicable    Life-Saving Intervention Made  Not Applicable     BP check. Bus passes for cardiology appointment.

## 2017-11-27 NOTE — Congregational Nurse Program (Signed)
Congregational Nurse Program Note  Date of Encounter: 11/12/2017  Past Medical History: Past Medical History:  Diagnosis Date  . Acute CVA (cerebrovascular accident) ([place]) 06/2017   "speech issues since" (07/24/2017)  . AKI (acute kidney injury) ([place]) 07/24/2017  . Anemia   . High cholesterol   . Lupus ([place])    "skin kind"  . NSVT (nonsustained ventricular tachycardia) ([place])     Encounter Details: CNP Questionnaire - 11/12/17 0830      Questionnaire   Patient Status  Not Applicable    Race  Black or African American    Location Patient [place]  Medicare    Uninsured  Not Applicable    Food  No food insecurities    Housing/Utilities  No permanent housing    Transportation  No transportation needs    Interpersonal Safety  No, do not feel physically and emotionally safe where you currently live    Medication  No medication insecurities    Medical Provider  Yes    Referrals  Other    ED Visit Averted  Not Applicable    Life-Saving Intervention Made  Not Applicable     BP check. Client has completed cardiology appointment. He states he wants to delay loop insertion until he is in stable housing. CN gave message fro mDr Griffin's office that client had no blood in urine.

## 2017-11-27 NOTE — Congregational Nurse Program (Signed)
Congregational Nurse Program Note  Date of Encounter: 11/15/2017  Past Medical History: Past Medical History:  Diagnosis Date  . Acute CVA (cerebrovascular accident) ([place]) 06/2017   "speech issues since" (07/24/2017)  . AKI (acute kidney injury) ([place]) 07/24/2017  . Anemia   . High cholesterol   . Lupus ([place])    "skin kind"  . NSVT (nonsustained ventricular tachycardia) ([place])     Encounter Details: CNP Questionnaire - 11/15/17 0900      Questionnaire   Patient Status  Not Applicable    Location Patient Served At  [employer]  Medicare    Uninsured  Not Applicable    Food  No food insecurities    Housing/Utilities  No permanent housing    Transportation  Provided transportation assistance (bus pass, taxi voucher, etc.);Yes, need transportation assistance    Interpersonal Safety  No, do not feel physically and emotionally safe where you currently live    Medication  No medication insecurities    Medical Provider  Yes    Referrals  Primary Care Provider/Clinic    ED Visit Averted  Not Applicable    Life-Saving Intervention Made  Not Applicable     BP check. Has URI symptoms x 1 week. Coughing. Non-productive. BBS- CTA. Follow up on MOnday.

## 2017-12-03 ENCOUNTER — Encounter: Payer: Self-pay | Admitting: Pediatric Intensive Care

## 2017-12-09 ENCOUNTER — Encounter: Payer: Self-pay | Admitting: Pediatric Intensive Care

## 2017-12-13 ENCOUNTER — Encounter: Payer: Self-pay | Admitting: Pediatric Intensive Care

## 2017-12-23 ENCOUNTER — Encounter: Payer: Self-pay | Admitting: Pediatric Intensive Care

## 2017-12-24 NOTE — Congregational Nurse Program (Signed)
Congregational Nurse Program Note  Date of Encounter: 11/19/2017  Past Medical History: Past Medical History:  Diagnosis Date  . Acute CVA (cerebrovascular accident) ([place]) 06/2017   "speech issues since" (07/24/2017)  . AKI (acute kidney injury) ([place]) 07/24/2017  . Anemia   . High cholesterol   . Lupus ([place])    "skin kind"  . NSVT (nonsustained ventricular tachycardia) ([place])     Encounter Details:  BP check. Client has had cold symptoms and is taking multi-symptom OTC medication. CN advised against this type of OTC medication due to history of hypertension and stroke. Bus passes provided for PCP appointment.

## 2017-12-25 NOTE — Congregational Nurse Program (Signed)
Congregational Nurse Program Note  Date of Encounter: 11/25/2017  Past Medical History: Past Medical History:  Diagnosis Date  . Acute CVA (cerebrovascular accident) ([place]) 06/2017   "speech issues since" (07/24/2017)  . AKI (acute kidney injury) ([place]) 07/24/2017  . Anemia   . High cholesterol   . Lupus ([place])    "skin kind"  . NSVT (nonsustained ventricular tachycardia) ([place])     Encounter Details:  BP check.

## 2017-12-30 ENCOUNTER — Encounter: Payer: Self-pay | Admitting: Pediatric Intensive Care

## 2018-01-03 NOTE — Congregational Nurse Program (Signed)
BP check  

## 2018-01-05 NOTE — Congregational Nurse Program (Signed)
BP check  

## 2018-01-06 ENCOUNTER — Encounter: Payer: Self-pay | Admitting: Pediatric Intensive Care

## 2018-01-07 ENCOUNTER — Encounter: Payer: Self-pay | Admitting: Pediatric Intensive Care

## 2018-01-16 DIAGNOSIS — Z23 Encounter for immunization: Secondary | ICD-10-CM | POA: Diagnosis not present

## 2018-01-27 ENCOUNTER — Encounter: Payer: Self-pay | Admitting: Pediatric Intensive Care

## 2018-01-27 NOTE — Congregational Nurse Program (Signed)
BP check  

## 2018-01-27 NOTE — Congregational Nurse Program (Signed)
Client will need medication refilled. CN program will cover co-pay.

## 2018-02-10 ENCOUNTER — Encounter: Payer: Self-pay | Admitting: Pediatric Intensive Care

## 2018-02-18 ENCOUNTER — Ambulatory Visit: Payer: Medicare Other | Admitting: Internal Medicine

## 2018-02-18 NOTE — Congregational Nurse Program (Signed)
BP check  

## 2018-02-19 ENCOUNTER — Encounter: Payer: Self-pay | Admitting: *Deleted

## 2018-02-25 NOTE — Congregational Nurse Program (Signed)
BP check. Client was not contacted for SCAT interview. Client given SCAT application to complete. PCP appointment on 10/17.

## 2018-03-07 ENCOUNTER — Encounter: Payer: Self-pay | Admitting: Pediatric Intensive Care

## 2018-03-20 NOTE — Congregational Nurse Program (Signed)
BP check. Confirmed SCAT appointment date.

## 2018-06-04 DIAGNOSIS — M79671 Pain in right foot: Secondary | ICD-10-CM | POA: Diagnosis not present

## 2018-06-04 DIAGNOSIS — R972 Elevated prostate specific antigen [PSA]: Secondary | ICD-10-CM | POA: Diagnosis not present

## 2018-06-04 DIAGNOSIS — Z0000 Encounter for general adult medical examination without abnormal findings: Secondary | ICD-10-CM | POA: Diagnosis not present

## 2018-06-04 DIAGNOSIS — N183 Chronic kidney disease, stage 3 (moderate): Secondary | ICD-10-CM | POA: Diagnosis not present

## 2018-06-04 DIAGNOSIS — Z8673 Personal history of transient ischemic attack (TIA), and cerebral infarction without residual deficits: Secondary | ICD-10-CM | POA: Diagnosis not present

## 2018-06-04 DIAGNOSIS — I129 Hypertensive chronic kidney disease with stage 1 through stage 4 chronic kidney disease, or unspecified chronic kidney disease: Secondary | ICD-10-CM | POA: Diagnosis not present

## 2018-06-04 DIAGNOSIS — Z1389 Encounter for screening for other disorder: Secondary | ICD-10-CM | POA: Diagnosis not present

## 2018-06-04 DIAGNOSIS — N400 Enlarged prostate without lower urinary tract symptoms: Secondary | ICD-10-CM | POA: Diagnosis not present

## 2018-06-04 DIAGNOSIS — E7800 Pure hypercholesterolemia, unspecified: Secondary | ICD-10-CM | POA: Diagnosis not present

## 2018-06-09 ENCOUNTER — Ambulatory Visit: Payer: Medicare Other | Admitting: Podiatry

## 2018-07-07 ENCOUNTER — Ambulatory Visit: Payer: Medicare Other | Admitting: Podiatry

## 2018-07-16 ENCOUNTER — Emergency Department (HOSPITAL_COMMUNITY): Payer: Medicare Other

## 2018-07-16 ENCOUNTER — Inpatient Hospital Stay (HOSPITAL_COMMUNITY)
Admission: EM | Admit: 2018-07-16 | Discharge: 2018-07-18 | DRG: 065 | Disposition: A | Discharge status: Home Health | Payer: Medicare Other | Attending: Internal Medicine | Admitting: Internal Medicine

## 2018-07-16 ENCOUNTER — Other Ambulatory Visit: Payer: Self-pay

## 2018-07-16 ENCOUNTER — Encounter (HOSPITAL_COMMUNITY): Payer: Self-pay | Admitting: Emergency Medicine

## 2018-07-16 DIAGNOSIS — Y92009 Unspecified place in unspecified non-institutional (private) residence as the place of occurrence of the external cause: Secondary | ICD-10-CM | POA: Diagnosis not present

## 2018-07-16 DIAGNOSIS — L930 Discoid lupus erythematosus: Secondary | ICD-10-CM | POA: Diagnosis present

## 2018-07-16 DIAGNOSIS — I472 Ventricular tachycardia: Secondary | ICD-10-CM | POA: Diagnosis present

## 2018-07-16 DIAGNOSIS — I10 Essential (primary) hypertension: Secondary | ICD-10-CM | POA: Diagnosis present

## 2018-07-16 DIAGNOSIS — I69322 Dysarthria following cerebral infarction: Secondary | ICD-10-CM | POA: Diagnosis not present

## 2018-07-16 DIAGNOSIS — I63312 Cerebral infarction due to thrombosis of left middle cerebral artery: Secondary | ICD-10-CM | POA: Diagnosis not present

## 2018-07-16 DIAGNOSIS — I6350 Cerebral infarction due to unspecified occlusion or stenosis of unspecified cerebral artery: Secondary | ICD-10-CM | POA: Diagnosis not present

## 2018-07-16 DIAGNOSIS — Y900 Blood alcohol level of less than 20 mg/100 ml: Secondary | ICD-10-CM | POA: Diagnosis present

## 2018-07-16 DIAGNOSIS — R200 Anesthesia of skin: Secondary | ICD-10-CM | POA: Diagnosis not present

## 2018-07-16 DIAGNOSIS — Z91128 Patient's intentional underdosing of medication regimen for other reason: Secondary | ICD-10-CM | POA: Diagnosis not present

## 2018-07-16 DIAGNOSIS — F1010 Alcohol abuse, uncomplicated: Secondary | ICD-10-CM | POA: Diagnosis present

## 2018-07-16 DIAGNOSIS — R258 Other abnormal involuntary movements: Secondary | ICD-10-CM | POA: Diagnosis present

## 2018-07-16 DIAGNOSIS — Z8249 Family history of ischemic heart disease and other diseases of the circulatory system: Secondary | ICD-10-CM | POA: Diagnosis not present

## 2018-07-16 DIAGNOSIS — T466X6A Underdosing of antihyperlipidemic and antiarteriosclerotic drugs, initial encounter: Secondary | ICD-10-CM | POA: Diagnosis present

## 2018-07-16 DIAGNOSIS — E785 Hyperlipidemia, unspecified: Secondary | ICD-10-CM | POA: Diagnosis present

## 2018-07-16 DIAGNOSIS — N183 Chronic kidney disease, stage 3 (moderate): Secondary | ICD-10-CM | POA: Diagnosis present

## 2018-07-16 DIAGNOSIS — Z7982 Long term (current) use of aspirin: Secondary | ICD-10-CM

## 2018-07-16 DIAGNOSIS — I739 Peripheral vascular disease, unspecified: Secondary | ICD-10-CM | POA: Diagnosis present

## 2018-07-16 DIAGNOSIS — R32 Unspecified urinary incontinence: Secondary | ICD-10-CM | POA: Diagnosis present

## 2018-07-16 DIAGNOSIS — Z79899 Other long term (current) drug therapy: Secondary | ICD-10-CM | POA: Diagnosis not present

## 2018-07-16 DIAGNOSIS — E7800 Pure hypercholesterolemia, unspecified: Secondary | ICD-10-CM | POA: Diagnosis present

## 2018-07-16 DIAGNOSIS — I129 Hypertensive chronic kidney disease with stage 1 through stage 4 chronic kidney disease, or unspecified chronic kidney disease: Secondary | ICD-10-CM | POA: Diagnosis present

## 2018-07-16 DIAGNOSIS — G8191 Hemiplegia, unspecified affecting right dominant side: Secondary | ICD-10-CM | POA: Diagnosis present

## 2018-07-16 DIAGNOSIS — R0902 Hypoxemia: Secondary | ICD-10-CM | POA: Diagnosis not present

## 2018-07-16 DIAGNOSIS — R29810 Facial weakness: Secondary | ICD-10-CM | POA: Diagnosis present

## 2018-07-16 DIAGNOSIS — Z87891 Personal history of nicotine dependence: Secondary | ICD-10-CM | POA: Diagnosis not present

## 2018-07-16 DIAGNOSIS — Z66 Do not resuscitate: Secondary | ICD-10-CM | POA: Diagnosis present

## 2018-07-16 DIAGNOSIS — I2510 Atherosclerotic heart disease of native coronary artery without angina pectoris: Secondary | ICD-10-CM | POA: Diagnosis present

## 2018-07-16 DIAGNOSIS — I639 Cerebral infarction, unspecified: Secondary | ICD-10-CM | POA: Diagnosis present

## 2018-07-16 DIAGNOSIS — R29701 NIHSS score 1: Secondary | ICD-10-CM | POA: Diagnosis present

## 2018-07-16 DIAGNOSIS — R531 Weakness: Secondary | ICD-10-CM | POA: Diagnosis not present

## 2018-07-16 DIAGNOSIS — I6389 Other cerebral infarction: Secondary | ICD-10-CM | POA: Diagnosis present

## 2018-07-16 DIAGNOSIS — F17200 Nicotine dependence, unspecified, uncomplicated: Secondary | ICD-10-CM | POA: Diagnosis present

## 2018-07-16 DIAGNOSIS — R9082 White matter disease, unspecified: Secondary | ICD-10-CM | POA: Diagnosis present

## 2018-07-16 DIAGNOSIS — T39016A Underdosing of aspirin, initial encounter: Secondary | ICD-10-CM | POA: Diagnosis present

## 2018-07-16 DIAGNOSIS — R262 Difficulty in walking, not elsewhere classified: Secondary | ICD-10-CM | POA: Diagnosis not present

## 2018-07-16 LAB — RAPID URINE DRUG SCREEN, HOSP PERFORMED
Amphetamines: NOT DETECTED
Barbiturates: NOT DETECTED
Benzodiazepines: NOT DETECTED
Cocaine: NOT DETECTED
OPIATES: NOT DETECTED
Tetrahydrocannabinol: NOT DETECTED

## 2018-07-16 LAB — COMPREHENSIVE METABOLIC PANEL
ALT: 29 U/L (ref 0–44)
AST: 28 U/L (ref 15–41)
Albumin: 3.1 g/dL — ABNORMAL LOW (ref 3.5–5.0)
Alkaline Phosphatase: 136 U/L — ABNORMAL HIGH (ref 38–126)
Anion gap: 6 (ref 5–15)
BUN: 14 mg/dL (ref 8–23)
CO2: 28 mmol/L (ref 22–32)
Calcium: 8.6 mg/dL — ABNORMAL LOW (ref 8.9–10.3)
Chloride: 103 mmol/L (ref 98–111)
Creatinine, Ser: 1.41 mg/dL — ABNORMAL HIGH (ref 0.61–1.24)
GFR calc Af Amer: 59 mL/min — ABNORMAL LOW (ref >60)
GFR, EST NON AFRICAN AMERICAN: 51 mL/min — AB (ref >60)
Glucose, Bld: 108 mg/dL — ABNORMAL HIGH (ref 70–99)
Potassium: 3.6 mmol/L (ref 3.5–5.1)
Sodium: 137 mmol/L (ref 135–145)
Total Bilirubin: 0.7 mg/dL (ref 0.3–1.2)
Total Protein: 6.5 g/dL (ref 6.5–8.1)

## 2018-07-16 LAB — CBC
HCT: 42.8 % (ref 39.0–52.0)
Hemoglobin: 12.7 g/dL — ABNORMAL LOW (ref 13.0–17.0)
MCH: 25 pg — ABNORMAL LOW (ref 26.0–34.0)
MCHC: 29.7 g/dL — ABNORMAL LOW (ref 30.0–36.0)
MCV: 84.4 fL (ref 80.0–100.0)
Platelets: 189 10*3/uL (ref 150–400)
RBC: 5.07 MIL/uL (ref 4.22–5.81)
RDW: 14.9 % (ref 11.5–15.5)
WBC: 4.1 10*3/uL (ref 4.0–10.5)
nRBC: 0 % (ref 0.0–0.2)

## 2018-07-16 LAB — URINALYSIS, ROUTINE W REFLEX MICROSCOPIC
Bilirubin Urine: NEGATIVE
Glucose, UA: NEGATIVE mg/dL
Hgb urine dipstick: NEGATIVE
Ketones, ur: NEGATIVE mg/dL
Leukocytes,Ua: NEGATIVE
Nitrite: NEGATIVE
PH: 7 (ref 5.0–8.0)
Protein, ur: NEGATIVE mg/dL
SPECIFIC GRAVITY, URINE: 1.013 (ref 1.005–1.030)

## 2018-07-16 LAB — DIFFERENTIAL
ABS IMMATURE GRANULOCYTES: 0.01 10*3/uL (ref 0.00–0.07)
Basophils Absolute: 0 10*3/uL (ref 0.0–0.1)
Basophils Relative: 1 %
Eosinophils Absolute: 0.3 10*3/uL (ref 0.0–0.5)
Eosinophils Relative: 6 %
Immature Granulocytes: 0 %
Lymphocytes Relative: 26 %
Lymphs Abs: 1.1 10*3/uL (ref 0.7–4.0)
MONOS PCT: 12 %
Monocytes Absolute: 0.5 10*3/uL (ref 0.1–1.0)
Neutro Abs: 2.3 10*3/uL (ref 1.7–7.7)
Neutrophils Relative %: 55 %

## 2018-07-16 LAB — APTT: aPTT: 31 seconds (ref 24–36)

## 2018-07-16 LAB — I-STAT TROPONIN, ED: Troponin i, poc: 0.02 ng/mL (ref 0.00–0.08)

## 2018-07-16 LAB — PROTIME-INR
INR: 1 (ref 0.8–1.2)
Prothrombin Time: 13.5 seconds (ref 11.4–15.2)

## 2018-07-16 LAB — ETHANOL: Alcohol, Ethyl (B): <10 mg/dL (ref <10)

## 2018-07-16 MED ORDER — ASPIRIN 325 MG PO TABS
325.0000 mg | ORAL_TABLET | Freq: Every day | ORAL | Status: DC
  Administered 2018-07-16 – 2018-07-17 (×2): 325 mg via ORAL
  Filled 2018-07-16 (×2): qty 1

## 2018-07-16 MED ORDER — SODIUM CHLORIDE 0.9 % IV SOLN
INTRAVENOUS | Status: DC
  Administered 2018-07-16: 23:00:00 via INTRAVENOUS

## 2018-07-16 MED ORDER — ACETAMINOPHEN 160 MG/5ML PO SOLN: 650.0000 mg | ORAL | Status: DC | PRN

## 2018-07-16 MED ORDER — ATORVASTATIN CALCIUM 40 MG PO TABS
40.0000 mg | ORAL_TABLET | Freq: Every day | ORAL | Status: DC
  Administered 2018-07-17: 40 mg via ORAL
  Filled 2018-07-16: qty 1

## 2018-07-16 MED ORDER — NICOTINE 21 MG/24HR TD PT24
21.0000 mg | MEDICATED_PATCH | Freq: Every day | TRANSDERMAL | Status: DC
  Filled 2018-07-16 (×2): qty 1

## 2018-07-16 MED ORDER — ACETAMINOPHEN 325 MG PO TABS: 650.0000 mg | ORAL_TABLET | ORAL | Status: DC | PRN

## 2018-07-16 MED ORDER — TAMSULOSIN HCL 0.4 MG PO CAPS: 0.4000 mg | ORAL_CAPSULE | Freq: Every day | ORAL | Status: DC

## 2018-07-16 MED ORDER — TAMSULOSIN HCL 0.4 MG PO CAPS
0.4000 mg | ORAL_CAPSULE | Freq: Every day | ORAL | Status: DC
  Administered 2018-07-16 – 2018-07-18 (×3): 0.4 mg via ORAL
  Filled 2018-07-16 (×3): qty 1

## 2018-07-16 MED ORDER — ACETAMINOPHEN 650 MG RE SUPP: 650.0000 mg | RECTAL | Status: DC | PRN

## 2018-07-16 MED ORDER — SENNOSIDES-DOCUSATE SODIUM 8.6-50 MG PO TABS: 1.0000 | ORAL_TABLET | Freq: Every evening | ORAL | Status: DC | PRN

## 2018-07-16 MED ORDER — LOSARTAN POTASSIUM 50 MG PO TABS
50.0000 mg | ORAL_TABLET | Freq: Every day | ORAL | Status: DC
  Administered 2018-07-17 – 2018-07-18 (×2): 50 mg via ORAL
  Filled 2018-07-16 (×3): qty 1

## 2018-07-16 MED ORDER — AMLODIPINE BESYLATE 10 MG PO TABS: 10.0000 mg | ORAL_TABLET | Freq: Every day | ORAL | Status: DC

## 2018-07-16 MED ORDER — AMLODIPINE BESYLATE 10 MG PO TABS
10.0000 mg | ORAL_TABLET | Freq: Every day | ORAL | Status: DC
  Administered 2018-07-17 – 2018-07-18 (×2): 10 mg via ORAL
  Filled 2018-07-16 (×3): qty 1

## 2018-07-16 MED ORDER — ENOXAPARIN SODIUM 40 MG/0.4ML ~~LOC~~ SOLN
40.0000 mg | SUBCUTANEOUS | Status: DC
  Administered 2018-07-16 – 2018-07-17 (×2): 40 mg via SUBCUTANEOUS
  Filled 2018-07-16 (×3): qty 0.4

## 2018-07-16 MED ORDER — STROKE: EARLY STAGES OF RECOVERY BOOK
Freq: Once | Status: AC
  Administered 2018-07-16: 23:00:00
  Filled 2018-07-16: qty 1

## 2018-07-16 MED ORDER — ASPIRIN 300 MG RE SUPP: 300.0000 mg | Freq: Every day | RECTAL | Status: DC

## 2018-07-16 NOTE — ED Provider Notes (Signed)
Received signout at the beginning of shift.  Patient here with right arm and right leg weakness, last known normal was approximately 3 days ago.  No headache, no pain.  Remote history of stroke last year causing dysphasia.  Work-up today is remarkable for a brain MRI showing small acute infarct of the left caudate nucleus tail and small subacute infarct of the right cerebellum.  I did discuss with neurologist, Dr. Cheral Marker who felt this is likely cardio embolic stroke.  He does not think the finding of the small subacute infarct of the right cerebellum is new.  He was see the patient and request medicine for admission.  Patient is aware of plan.  8:37 PM Appreciate consultation by [facility] Dr. Jonelle Sidle who agrees to see and admit pt for further management of his acute cardioembolic stroke.   CRITICAL CARE Performed by: Domenic Moras Total critical care time: 35 minutes Critical care time was exclusive of separately billable procedures and treating other patients. Critical care was necessary to treat or prevent imminent or life-threatening deterioration. Critical care was time spent personally by me on the following activities: development of treatment plan with patient and/or surrogate as well as nursing, discussions with consultants, evaluation of patient's response to treatment, examination of patient, obtaining history from patient or surrogate, ordering and performing treatments and interventions, ordering and review of laboratory studies, ordering and review of radiographic studies, pulse oximetry and re-evaluation of patient's condition.   BP (!) 156/87   Pulse (!) 58   Temp 98.6 F (37 C) (Oral)   Resp 20   Ht 6\' 2"  (1.88 m)   SpO2 100%   BMI 24.78 kg/m   Results for orders placed or performed during the hospital encounter of 07/16/18  Ethanol  Result Value Ref Range   Alcohol, Ethyl (B) <10 <10 mg/dL  Protime-INR  Result Value Ref Range   Prothrombin Time 13.5 11.4 - 15.2  seconds   INR 1.0 0.8 - 1.2  APTT  Result Value Ref Range   aPTT 31 24 - 36 seconds  CBC  Result Value Ref Range   WBC 4.1 4.0 - 10.5 K/uL   RBC 5.07 4.22 - 5.81 MIL/uL   Hemoglobin 12.7 (L) 13.0 - 17.0 g/dL   HCT 42.8 39.0 - 52.0 %   MCV 84.4 80.0 - 100.0 fL   MCH 25.0 (L) 26.0 - 34.0 pg   MCHC 29.7 (L) 30.0 - 36.0 g/dL   RDW 14.9 11.5 - 15.5 %   Platelets 189 150 - 400 K/uL   nRBC 0.0 0.0 - 0.2 %  Differential  Result Value Ref Range   Neutrophils Relative % 55 %   Neutro Abs 2.3 1.7 - 7.7 K/uL   Lymphocytes Relative 26 %   Lymphs Abs 1.1 0.7 - 4.0 K/uL   Monocytes Relative 12 %   Monocytes Absolute 0.5 0.1 - 1.0 K/uL   Eosinophils Relative 6 %   Eosinophils Absolute 0.3 0.0 - 0.5 K/uL   Basophils Relative 1 %   Basophils Absolute 0.0 0.0 - 0.1 K/uL   Immature Granulocytes 0 %   Abs Immature Granulocytes 0.01 0.00 - 0.07 K/uL  Comprehensive metabolic panel  Result Value Ref Range   Sodium 137 135 - 145 mmol/L   Potassium 3.6 3.5 - 5.1 mmol/L   Chloride 103 98 - 111 mmol/L   CO2 28 22 - 32 mmol/L   Glucose, Bld 108 (H) 70 - 99 mg/dL   BUN 14 8 -  23 mg/dL   Creatinine, Ser 1.41 (H) 0.61 - 1.24 mg/dL   Calcium 8.6 (L) 8.9 - 10.3 mg/dL   Total Protein 6.5 6.5 - 8.1 g/dL   Albumin 3.1 (L) 3.5 - 5.0 g/dL   AST 28 15 - 41 U/L   ALT 29 0 - 44 U/L   Alkaline Phosphatase 136 (H) 38 - 126 U/L   Total Bilirubin 0.7 0.3 - 1.2 mg/dL   GFR calc non Af Amer 51 (L) >60 mL/min   GFR calc Af Amer 59 (L) >60 mL/min   Anion gap 6 5 - 15  Urine rapid drug screen (hosp performed)  Result Value Ref Range   Opiates NONE DETECTED NONE DETECTED   Cocaine NONE DETECTED NONE DETECTED   Benzodiazepines NONE DETECTED NONE DETECTED   Amphetamines NONE DETECTED NONE DETECTED   Tetrahydrocannabinol NONE DETECTED NONE DETECTED   Barbiturates NONE DETECTED NONE DETECTED  Urinalysis, Routine w reflex microscopic  Result Value Ref Range   Color, Urine YELLOW YELLOW   APPearance CLEAR CLEAR    Specific Gravity, Urine 1.013 1.005 - 1.030   pH 7.0 5.0 - 8.0   Glucose, UA NEGATIVE NEGATIVE mg/dL   Hgb urine dipstick NEGATIVE NEGATIVE   Bilirubin Urine NEGATIVE NEGATIVE   Ketones, ur NEGATIVE NEGATIVE mg/dL   Protein, ur NEGATIVE NEGATIVE mg/dL   Nitrite NEGATIVE NEGATIVE   Leukocytes,Ua NEGATIVE NEGATIVE  I-stat troponin, ED  Result Value Ref Range   Troponin i, poc 0.02 0.00 - 0.08 ng/mL   Comment 3           Ct Head Wo Contrast  Result Date: 07/16/2018 CLINICAL DATA:  Acute right leg numbness. EXAM: CT HEAD WITHOUT CONTRAST TECHNIQUE: Contiguous axial images were obtained from the base of the skull through the vertex without intravenous contrast. COMPARISON:  CT scan of November 07, 2017. FINDINGS: Brain: Mild chronic ischemic white matter disease is noted. No mass effect or midline shift is noted. Ventricular size is within normal limits. There is no evidence of mass lesion, hemorrhage or acute infarction. Vascular: No hyperdense vessel or unexpected calcification. Skull: Normal. Negative for fracture or focal lesion. Sinuses/Orbits: No acute finding. Other: None. IMPRESSION: Mild chronic ischemic white matter disease. No acute intracranial abnormality seen. Electronically Signed   By: Marijo Conception, M.D.   On: 07/16/2018 16:04   Mr Brain Wo Contrast  Result Date: 07/16/2018 CLINICAL DATA:  Right-sided weakness. EXAM: MRI HEAD WITHOUT CONTRAST TECHNIQUE: Multiplanar, multiecho pulse sequences of the brain and surrounding structures were obtained without intravenous contrast. COMPARISON:  Head CT 07/16/2018 Brain MRI 07/18/2017 FINDINGS: BRAIN: Small focus of acute ischemia within the tail of the left caudate nucleus. Mild diffusion weighted hyperintensity the right cerebellum without clear defect on ADC map. The midline structures are normal. No midline shift or other mass effect. Diffuse confluent hyperintense T2-weighted signal within the periventricular, deep and juxtacortical  white matter, most commonly due to chronic ischemic microangiopathy. There are areas of cystic change at the site of old infarcts in the deep gray nuclei and centrum semiovale. Generalized atrophy without lobar predilection. There are multiple foci of chronic microhemorrhage, predominantly within the thalami and basal ganglia, consistent with chronic hypertensive angiopathy. VASCULAR: Major intracranial arterial and venous sinus flow voids are normal. SKULL AND UPPER CERVICAL SPINE: Calvarial bone marrow signal is normal. There is no skull base mass. Visualized upper cervical spine and soft tissues are normal. SINUSES/ORBITS: No fluid levels or advanced mucosal thickening. No mastoid or middle  ear effusion. The orbits are normal. IMPRESSION: 1. Small acute infarcts of the left caudate nucleus tail, which may exert an effect on the left internal capsule, in keeping with reported right-sided symptoms. 2. Small subacute infarct of the right cerebellum. 3. Central chronic microhemorrhages in a pattern consistent with hypertensive angiopathy. 4. No hemorrhage or mass effect. Electronically Signed   By: Ulyses Jarred M.D.   On: 07/16/2018 18:28      Domenic Moras, PA-C 07/16/18 2038    Fredia Sorrow, MD 07/17/18 [phone]

## 2018-07-16 NOTE — ED Provider Notes (Signed)
[facility] Provider Note   CSN: [id number] Arrival date & time: 07/16/18  1339    History   Chief Complaint Chief Complaint  Patient presents with  . stroke like symptoms    HPI Isaac Reyes is a 68 y.o. male.     Isaac Reyes is a 68 y.o. male with a history of prior stroke, hyperlipidemia, CKD, anemia, lupus, who presents to the emergency department for evaluation of weakness in the right leg.  Last known normal of 3 AM on Monday.  He reports symptoms have persisted since Monday morning.  He reports that he is able to move the right leg but it feels weaker when compared to the left and he has had difficulty walking.  He denies any falls or head injury.  He reports he has noticed some mild weakness in the right arm as well.  He denies any numbness in his extremities.  No facial asymmetry or numbness over the face.  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  No difficulty swallowing.  Patient denies any associated headache.  No vision changes or diplopia.  No dizziness or difficulty with balance.  No nausea or vomiting.  No associated chest pain or shortness of breath.  Reports he has never had focal weakness like this with prior strokes.     Past Medical History:  Diagnosis Date  . Acute CVA (cerebrovascular accident) ([place]) 06/2017   "speech issues since" (07/24/2017)  . AKI (acute kidney injury) ([place]) 07/24/2017  . Anemia   . High cholesterol   . Lupus ([place])    "skin kind"  . NSVT (nonsustained ventricular tachycardia) [facility])     Patient Active Problem List   Diagnosis Date Noted  . Syncope 11/07/2017  . Benign essential HTN 07/26/2017  . Acute kidney failure ([place]) 07/24/2017  . CVA (cerebral vascular accident) ([place]) 07/19/2017  . Stroke (cerebrum) ([place]) 07/18/2017  . NSVT (nonsustained ventricular tachycardia) ([place]) 07/18/2017    . Elevated blood pressure reading 07/18/2017  . Normocytic normochromic anemia 07/18/2017  . Malnutrition of moderate degree 07/18/2017  . Normocytic anemia   . Frequent falls   . Renal insufficiency   . Hyperlipidemia   . Heavy smoker   . Alcohol abuse   . Abnormal EKG 03/19/2013  . Pre-operative cardiovascular exam, new EKG abnormalities c/w ischemia 03/19/2013  . LVH (left ventricular hypertrophy) 03/19/2013  . Murmur, cardiac 03/19/2013    Past Surgical History:  Procedure Laterality Date  . KNEE ARTHROSCOPY    . TONSILLECTOMY          Home Medications    Prior to Admission medications   Medication Sig Start Date End Date Taking? Authorizing Provider  amLODipine (NORVASC) 10 MG tablet Take 1 tablet (10 mg total) by mouth daily. 07/27/17   [name], MD  ASPIRIN LOW DOSE 81 MG EC tablet Take 81 mg by mouth daily. 09/26/17   [provider]  atorvastatin (LIPITOR) 10 MG tablet Take 10 mg by mouth daily. 09/27/17   [provider]  dextromethorphan-guaiFENesin (MUCINEX DM) 30-600 MG 12hr tablet Take 1 tablet by mouth 2 (two) times daily. 11/09/17   Hosie Poisson, MD  losartan (COZAAR) 50 MG tablet Take 50 mg by mouth daily. 09/27/17   [provider]  tamsulosin (FLOMAX) 0.4 MG CAPS capsule Take 0.4 mg by mouth daily. 10/03/17  [provider]    Family History Family History  Problem Relation Age of Onset  . Heart attack Father     Social History Social History   Tobacco Use  . Smoking status: Former Smoker    Packs/day: 0.12    Years: 45.00    Pack years: 5.40    Types: Cigars, Cigarettes    Last attempt to quit: 10/19/2016    Years since quitting: 1.7  . Smokeless tobacco: Never Used  Substance Use Topics  . Alcohol use: Yes    Alcohol/week: 3.0 standard drinks    Types: 3 Standard drinks or equivalent per week  . Drug use: No     Allergies   Patient has no known allergies.   Review of Systems Review of Systems   Constitutional: Negative for chills and fever.  HENT: Negative.   Eyes: Negative for visual disturbance.  Respiratory: Negative for cough and shortness of breath.   Cardiovascular: Negative for chest pain.  Gastrointestinal: Negative for abdominal pain, nausea and vomiting.  Genitourinary: Negative for dysuria and frequency.  Musculoskeletal: Negative for arthralgias and myalgias.  Skin: Negative for color change and rash.  Neurological: Positive for weakness. Negative for dizziness, facial asymmetry, speech difficulty, light-headedness, numbness and headaches.  All other systems reviewed and are negative.    Physical Exam Updated Vital Signs BP (!) 168/88   Pulse 62   Temp 98.6 F (37 C) (Oral)   Resp 14   Ht 6\' 2"  (1.88 m)   SpO2 98%   BMI 24.78 kg/m   Physical Exam Vitals signs and nursing note reviewed.  Constitutional:      General: He is not in acute distress.    Appearance: Normal appearance. He is well-developed. He is not diaphoretic.  HENT:     Head: Normocephalic and atraumatic.  Eyes:     General:        Right eye: No discharge.        Left eye: No discharge.     Pupils: Pupils are equal, round, and reactive to light.  Neck:     Musculoskeletal: Neck supple.  Cardiovascular:     Rate and Rhythm: Normal rate and regular rhythm.     Heart sounds: Normal heart sounds.  Pulmonary:     Effort: Pulmonary effort is normal. No respiratory distress.     Breath sounds: Normal breath sounds. No wheezing or rales.     Comments: Respirations equal and unlabored, patient able to speak in full sentences, lungs clear to auscultation bilaterally Abdominal:     General: Bowel sounds are normal. There is no distension.     Palpations: Abdomen is soft. There is no mass.     Tenderness: There is no abdominal tenderness. There is no guarding.  Musculoskeletal:        General: No deformity.  Skin:    General: Skin is warm and dry.     Capillary Refill: Capillary refill  takes less than 2 seconds.  Neurological:     Mental Status: He is alert.     Coordination: Coordination normal.     Comments: Speech is slurred, able to follow commands CN III-XII intact Strength: RUE 4/5, LUE 5/5,  RLE 4/5, LLE 5/5 Sensation normal to light and sharp touch in bilateral upper and lower extremities. Moves extremities without ataxia, coordination intact No pronator drift  Psychiatric:        Mood and Affect: Mood normal.        Behavior: Behavior  normal.      ED Treatments / Results  Labs (all labs ordered are listed, but only abnormal results are displayed) Labs Reviewed  CBC - Abnormal; Notable for the following components:      Result Value   Hemoglobin 12.7 (*)    MCH 25.0 (*)    MCHC 29.7 (*)    All other components within normal limits  COMPREHENSIVE METABOLIC PANEL - Abnormal; Notable for the following components:   Glucose, Bld 108 (*)    Creatinine, Ser 1.41 (*)    Calcium 8.6 (*)    Albumin 3.1 (*)    Alkaline Phosphatase 136 (*)    GFR calc non Af Amer 51 (*)    GFR calc Af Amer 59 (*)    All other components within normal limits  ETHANOL  PROTIME-INR  APTT  DIFFERENTIAL  RAPID URINE DRUG SCREEN, HOSP PERFORMED  URINALYSIS, ROUTINE W REFLEX MICROSCOPIC  I-STAT TROPONIN, ED    EKG None  Radiology No results found.  Procedures Procedures (including critical care time)  Medications Ordered in ED Medications - No data to display   Initial Impression / Assessment and Plan / ED Course  I have reviewed the triage vital signs and the nursing notes.  Pertinent labs & imaging results that were available during my care of the patient were reviewed by me and considered in my medical decision making (see chart for details).  Patient presents to the emergency department for evaluation of right leg weakness which started Monday morning.  He is also noticed some weakness in his right arm, symptoms have been constant and not improving.  He  denies any other associated neurologic deficits.  He has had prior strokes and has chronic slurred speech which is unchanged.  No numbness, facial asymmetry or vision changes.  Patient denies headache, vomiting or dizziness.  No recent head trauma or falls.  Presentation concerning for stroke, patient is outside of the window for acute intervention, code stroke was not activated but will proceed with typical stroke labs and CT of the head, patient will likely need MRI as well.  EKG without concerning changes.  Negative troponin.  No leukocytosis, hemoglobin of 12.7, creatinine of 1.41 which is slightly worse than baseline, no other acute electrolyte derangements requiring intervention.  Normal clotting factors, negative ethanol level, urinalysis without signs of infection and UDS clear.  At shift change care signed out to PA Domenic Moras pending CT of the head, if normal patient will require MRI for likely stroke and admission.  Final Clinical Impressions(s) / ED Diagnoses   Final diagnoses:  Right sided weakness    ED Discharge Orders    None       Jacqlyn Larsen, [place] 07/16/18 1736    Maudie Flakes, MD 07/17/18 [phone]

## 2018-07-16 NOTE — ED Notes (Signed)
Patient transported to CT 

## 2018-07-16 NOTE — ED Triage Notes (Signed)
Pt arrives via EMS from home with reports of LSN at 3 am Monday. Pt has hx of CVA. Pt reports right foot weakness.

## 2018-07-16 NOTE — H&P (Signed)
History and Physical   KACHE MCCLURG DZH:[id number] DOB: 16-Aug-1950 DOA: 07/16/2018  Referring MD/NP/PA: Dr. Bobby Rumpf  PCP: [name], MD   Outpatient Specialists: None  Patient coming from: Home  Chief Complaint: Right-sided weakness  HPI: Isaac Reyes is a 68 y.o. 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  Also difficulty speaking.  Patient said he has been lying down on the floor for at least 2 days watching TV.  He was unable to get up.  Denied any fall.  Denied any dizziness denied any sudden weakness in any of his limbs.  Denied any headache.  Patient has claimed to be compliant with his medications.  Evaluation in the ER showed subacute CVA.  His last known normal was at 3 PM on Monday and he is coming on Wednesday.  Patient is otherwise hemodynamically stable.  He has been evaluated by neurology and is being admitted to the hospital for complete work-up of acute CVA.  ED Course: Temperature is 98.6 blood pressure 168/88 pulse 74 respiratory 23 oxygen sat 98% room air.  Chemistry is normal except for creatinine 1.4 and calcium 8.6.  Hemoglobin 12.7 also.  Urinalysis is negative.  Urine drugs screen also negative PT/INR within normal and alcohol level less than 10.  Head CT without contrast showed no acute findings.  MRI of the brain showed   Review of Systems: As per HPI otherwise 10 point review of systems negative.    Past Medical History:  Diagnosis Date  . Acute CVA (cerebrovascular accident) ([place]) 06/2017   "speech issues since" (07/24/2017)  . AKI (acute kidney injury) ([place]) 07/24/2017  . Anemia   . High cholesterol   . Lupus ([place])    "skin kind"  . NSVT (nonsustained ventricular tachycardia) ([facility])     Past Surgical History:  Procedure  Laterality Date  . KNEE ARTHROSCOPY    . TONSILLECTOMY       reports that he quit smoking about 20 months ago. His smoking use included cigars and cigarettes. He has a 5.40 pack-year smoking history. He has never used smokeless tobacco. He reports current alcohol use of about 3.0 standard drinks of alcohol per week. He reports that he does not use drugs.  No Known Allergies  Family History  Problem Relation Age of Onset  . Heart attack Father      Prior to Admission medications   Medication Sig Start Date End Date Taking? Authorizing Provider  amLODipine (NORVASC) 10 MG tablet Take 1 tablet (10 mg total) by mouth daily. 07/27/17   [name], MD  ASPIRIN LOW DOSE 81 MG EC tablet Take 81 mg by mouth daily. 09/26/17   [provider]  atorvastatin (LIPITOR) 10 MG tablet Take 10 mg by mouth daily. 09/27/17   [provider]  dextromethorphan-guaiFENesin (MUCINEX DM) 30-600 MG 12hr tablet Take 1 tablet by mouth 2 (two) times daily. 11/09/17   Hosie Poisson, MD  losartan (COZAAR) 50 MG tablet Take 50 mg by mouth daily. 09/27/17   [provider]  tamsulosin (FLOMAX) 0.4 MG CAPS capsule Take 0.4 mg by mouth daily. 10/03/17   [provider]    Physical Exam: Vitals:   07/16/18 1415 07/16/18 1500 07/16/18 2105 07/16/18 2145  BP: (!) 148/84 (!) 156/87 (!) 154/94 (!) 163/96  Pulse: (!) 58  74 72  Resp: (!) 23 20 18 19   Temp:      TempSrc:      SpO2: 100%  100% 100%  Weight:    92.7 kg  Height:    6\' 2"  (1.88 m)      Constitutional: NAD, calm, comfortable Vitals:   07/16/18 1415 07/16/18 1500 07/16/18 2105 07/16/18 2145  BP: (!) 148/84 (!) 156/87 (!) 154/94 (!) 163/96  Pulse: (!) 58  74 72  Resp: (!) 23 20 18 19   Temp:      TempSrc:      SpO2: 100%  100% 100%  Weight:    92.7 kg  Height:    6\' 2"  (1.88 m)   Eyes: PERRL, lids and conjunctivae normal ENMT: Mucous membranes are moist. Posterior pharynx clear of any exudate or lesions.Normal  dentition.  Neck: normal, supple, no masses, no thyromegaly Respiratory: clear to auscultation bilaterally, no wheezing, no crackles. Normal respiratory effort. No accessory muscle use.  Cardiovascular: Regular rate and rhythm, no murmurs / rubs / gallops. No extremity edema. 2+ pedal pulses. No carotid bruits.  Abdomen: no tenderness, no masses palpated. No hepatosplenomegaly. Bowel sounds positive.  Musculoskeletal: no clubbing / cyanosis. No joint deformity upper and lower extremities. Good ROM, no contractures. Normal muscle tone.  Skin: no rashes, lesions, ulcers. No induration Neurologic: Right hemiparesis, dysarthria, Psychiatric: Poor judgment and insight. Alert and oriented x 3. Normal mood.     Labs on Admission: I have personally reviewed following labs and imaging studies  CBC: Recent Labs  Lab 07/16/18 1417  WBC 4.1  NEUTROABS 2.3  HGB 12.7*  HCT 42.8  MCV 84.4  PLT 782   Basic Metabolic Panel: Recent Labs  Lab 07/16/18 1417  NA 137  K 3.6  CL 103  CO2 28  GLUCOSE 108*  BUN 14  CREATININE 1.41*  CALCIUM 8.6*   GFR: Estimated Creatinine Clearance: 58.3 mL/min (A) (by C-G formula based on SCr of 1.41 mg/dL (H)). Liver Function Tests: Recent Labs  Lab 07/16/18 1417  AST 28  ALT 29  ALKPHOS 136*  BILITOT 0.7  PROT 6.5  ALBUMIN 3.1*   No results for input(s): LIPASE, AMYLASE in the last 168 hours. No results for input(s): AMMONIA in the last 168 hours. Coagulation Profile: Recent Labs  Lab 07/16/18 1417  INR 1.0   Cardiac Enzymes: No results for input(s): CKTOTAL, CKMB, CKMBINDEX, TROPONINI in the last 168 hours. BNP (last 3 results) No results for input(s): PROBNP in the last 8760 hours. HbA1C: No results for input(s): HGBA1C in the last 72 hours. CBG: No results for input(s): GLUCAP in the last 168 hours. Lipid Profile: No results for input(s): CHOL, HDL, LDLCALC, TRIG, CHOLHDL, LDLDIRECT in the last 72 hours. Thyroid Function Tests: No  results for input(s): TSH, T4TOTAL, FREET4, T3FREE, THYROIDAB in the last 72 hours. Anemia Panel: No results for input(s): VITAMINB12, FOLATE, FERRITIN, TIBC, IRON, RETICCTPCT in the last 72 hours. Urine analysis:    Component Value Date/Time   COLORURINE YELLOW 07/16/2018 [place] 07/16/2018 1544   LABSPEC 1.013 07/16/2018 1544   PHURINE 7.0 07/16/2018 1544   GLUCOSEU NEGATIVE 07/16/2018 1544   HGBUR NEGATIVE 07/16/2018 [place] 07/16/2018 1544   KETONESUR NEGATIVE 07/16/2018 1544   PROTEINUR NEGATIVE 07/16/2018 1544   NITRITE NEGATIVE 07/16/2018 1544   LEUKOCYTESUR NEGATIVE 07/16/2018 1544   Sepsis Labs: @LABRCNTIP (procalcitonin:4,lacticidven:4) )No results found for this or any previous visit (from the past 240 hour(s)).  Radiological Exams on Admission: Ct Head Wo Contrast  Result Date: 07/16/2018 CLINICAL DATA:  Acute right leg numbness. EXAM: CT HEAD WITHOUT CONTRAST TECHNIQUE: Contiguous axial images were obtained from the base of the skull through the vertex without intravenous contrast. COMPARISON:  CT scan of November 07, 2017. FINDINGS: Brain: Mild chronic ischemic white matter disease is noted. No mass effect or midline shift is noted. Ventricular size is within normal limits. There is no evidence of mass lesion, hemorrhage or acute infarction. Vascular: No hyperdense vessel or unexpected calcification. Skull: Normal. Negative for fracture or focal lesion. Sinuses/Orbits: No acute finding. Other: None. IMPRESSION: Mild chronic ischemic white matter disease. No acute intracranial abnormality seen. Electronically Signed   By: Marijo Conception, M.D.   On: 07/16/2018 16:04   Mr Brain Wo Contrast  Result Date: 07/16/2018 CLINICAL DATA:  Right-sided weakness. EXAM: MRI HEAD WITHOUT CONTRAST TECHNIQUE: Multiplanar, multiecho pulse sequences of the brain and surrounding structures were obtained without intravenous contrast. COMPARISON:  Head CT  07/16/2018 Brain MRI 07/18/2017 FINDINGS: BRAIN: Small focus of acute ischemia within the tail of the left caudate nucleus. Mild diffusion weighted hyperintensity the right cerebellum without clear defect on ADC map. The midline structures are normal. No midline shift or other mass effect. Diffuse confluent hyperintense T2-weighted signal within the periventricular, deep and juxtacortical white matter, most commonly due to chronic ischemic microangiopathy. There are areas of cystic change at the site of old infarcts in the deep gray nuclei and centrum semiovale. Generalized atrophy without lobar predilection. There are multiple foci of chronic microhemorrhage, predominantly within the thalami and basal ganglia, consistent with chronic hypertensive angiopathy. VASCULAR: Major intracranial arterial and venous sinus flow voids are normal. SKULL AND UPPER CERVICAL SPINE: Calvarial bone marrow signal is normal. There is no skull base mass. Visualized upper cervical spine and soft tissues are normal. SINUSES/ORBITS: No fluid levels or advanced mucosal thickening. No mastoid or middle ear effusion. The orbits are normal. IMPRESSION: 1. Small acute infarcts of the left caudate nucleus tail, which may exert an effect on the left internal capsule, in keeping with reported right-sided symptoms. 2. Small subacute infarct of the right cerebellum. 3. Central chronic microhemorrhages in a pattern consistent with hypertensive angiopathy. 4. No hemorrhage or mass effect. Electronically Signed   By: Ulyses Jarred M.D.   On: 07/16/2018 18:28    EKG: Independently reviewed.  It showed normal sinus rhythm with a rate of 64, flipped T waves in the lateral leads with poor R wave progression.  Appears no significant change from previous  Assessment/Plan Principal Problem:   CVA (cerebral vascular accident) ([place]) Active Problems:   Hyperlipidemia   Heavy smoker   Alcohol abuse   Benign essential HTN     #1 acute left caudate  CVA: Most likely affecting the internal capsule and reflecting the right-sided nature of the patient's symptoms.  Patient will be admitted.  PT OT consultation and speech therapy consult.  Initiate aspirin and statin full dose of a high intensity at 40 mg of Lipitor.  Neurology already consulted.  Get echocardiogram.  Also carotid Dopplers.  #2 hyperlipidemia: Continue with Lipitor at 40 mg daily  #4 tobacco abuse: Nicotine patch will be added.  #5 history of alcohol abuse: Patient denied any recent alcohol intake.  Alcohol level is less than 10.  Will be watching for possible withdrawals.  If noted we will initiate CIWA protocol.  #6 hypertension: Evidence of hypertensive angiopathy.  Tight blood pressure control.  DVT prophylaxis: Lovenox Code Status: Full code Family Communication: Care discussed with patient no family around Disposition Plan: Home Consults called: Dr. Cheral Marker of neurology Admission status: Inpatient  Severity of Illness: The appropriate patient status for this patient is INPATIENT. Inpatient status is judged to be reasonable and necessary in order to provide the required intensity of service to ensure the patient's safety. The patient's presenting symptoms, physical exam findings, and initial radiographic and laboratory data in the context of their chronic comorbidities is felt to place them at high risk for further clinical deterioration. Furthermore, it is not anticipated that the patient will be medically stable for discharge from the hospital within 2 midnights of admission. The following factors support the patient status of inpatient.   " The patient's presenting symptoms include right-sided weakness. " The worrisome physical exam findings include dysarthria with right hemiparesis. " The initial radiographic and laboratory data are worrisome because of MRI evidence of acute CVA. " The chronic co-morbidities include hypertension tobacco abuse and alcohol  abuse.   * I certify that at the point of admission it is my clinical judgment that the patient will require inpatient hospital care spanning beyond 2 midnights from the point of admission due to high intensity of service, high risk for further deterioration and high frequency of surveillance required.Barbette Merino MD [facility] Pager [phone]  If 7PM-7AM, please contact night-coverage [email or web address] Password [facility]  07/16/2018, 11:22 PM

## 2018-07-16 NOTE — Consult Note (Signed)
Referring Physician: Dr. Jonelle Sidle    Chief Complaint: Right hemiparesis  HPI: Isaac Reyes is an 68 y.o. male who has a prior history of stroke with residual dysarthria, presenting to the ED via EMS this afternoon with right sided weakness. LKN was at 3 PM on Monday.   He states that he spent the last 2 days lying on the floor of one of the rooms of his house watching TV. When he tried to get up today, he could not, due to right sided weakness. States that he has had no fall. Denies associated limb weakness.   He states that he does not have a headache or neck pain. No limb pain. No vision changes or confusion. No trouble swallowing or chewing. No dizziness or N/V. No CP, SOB or abdominal pain. Does have some difficulty with bladder incontinence.  His PMHx includes HLD, CKD, anemia and lupus (involving the skin, per patient).   He states that he watches his diet, reading the labels on all of his food. He also restricts his sodium intake. He states "I take my medications religiously".     Past Medical History:  Diagnosis Date  . Acute CVA (cerebrovascular accident) ([place]) 06/2017   "speech issues since" (07/24/2017)  . AKI (acute kidney injury) ([facility]) 07/24/2017  . Anemia   . High cholesterol   . Lupus ([facility])    "skin kind"  . NSVT (nonsustained ventricular tachycardia) ([facility])     Past Surgical History:  Procedure Laterality Date  . KNEE ARTHROSCOPY    . TONSILLECTOMY      Family History  Problem Relation Age of Onset  . Heart attack Father    Social History:  reports that he quit smoking about 20 months ago. His smoking use included cigars and cigarettes. He has a 5.40 pack-year smoking history. He has never used smokeless tobacco. He reports current alcohol use of about 3.0 standard drinks of alcohol per week. He reports that he does not use drugs.  Allergies: No Known Allergies  Home Medications:  Amlodipine ASA Atorvastatin Mucinex Losartan Flomax  ROS: As per HPI. All  other systems negative.   Physical Examination: Blood pressure (!) 156/87, pulse (!) 58, temperature 98.6 F (37 C), temperature source Oral, resp. rate 20, height 6\' 2"  (1.88 m), SpO2 100 %.  HEENT: [place]/AT. Depigmented patches noted.  Lungs: Respirations unlabored Ext: No edema  Neurologic Examination: Mental Status: Alert, fully oriented, thought content appropriate.  Speech fluent with intact comprehension. Had mild difficulty with naming. Repetition intact. Able to follow a 3 step directional command without difficulty. Moderate dysarthria.  Cranial Nerves: II:  Visual fields intact with no extinction to DSS. PERRL.  III,IV, VI: EOMI without nystagmus. No ptosis.  V,VII: Subtle lag on the right when asked to smile. Facial temp sensation equal bilaterally.   VIII: Hearing intact to conversation.  IX,X: Palate rises symmetrically XI: Head is midline  XII: Midline tongue extension  Motor: LUE and LLE 5/5 proximally and distally RUE with delayed movement to command, 4/5 proximally; 4+/5 grip RLE 4+/5 proximally with 4/5 ADF/APF RUE pronates without drift when held outstretched with eyes closed Sensory: Temp and light touch intact x 4 without extinction Deep Tendon Reflexes:  2+ right biceps and brachioradialis 1+ left biceps and brachioradialis 4+ right patella, 2+ left patella 2+ right achilles, 1+ left achilles Right toe upgoing, left downgoing Cerebellar: No ataxia with FNF or H-S bilaterally. Slower movement on the right is noted.  Gait: Deferred  Results  for orders placed or performed during the hospital encounter of 07/16/18 (from the past 48 hour(s))  Ethanol     Status: None   Collection Time: 07/16/18  2:17 PM  Result Value Ref Range   Alcohol, Ethyl (B) <10 <10 mg/dL    Comment: (NOTE) Lowest detectable limit for serum alcohol is 10 mg/dL. For medical purposes only. Performed at [facility], [place] [address] [place], [place] [address]   Protime-INR      Status: None   Collection Time: 07/16/18  2:17 PM  Result Value Ref Range   Prothrombin Time 13.5 11.4 - 15.2 seconds   INR 1.0 0.8 - 1.2    Comment: (NOTE) INR goal varies based on device and disease states. Performed at [facility], [place] [address], [place], [place] [address]   APTT     Status: None   Collection Time: 07/16/18  2:17 PM  Result Value Ref Range   aPTT 31 24 - 36 seconds    Comment: Performed at [facility] [address] [place], [place] [address]  CBC     Status: Abnormal   Collection Time: 07/16/18  2:17 PM  Result Value Ref Range   WBC 4.1 4.0 - 10.5 K/uL   RBC 5.07 4.22 - 5.81 MIL/uL   Hemoglobin 12.7 (L) 13.0 - 17.0 g/dL   HCT 42.8 39.0 - 52.0 %   MCV 84.4 80.0 - 100.0 fL   MCH 25.0 (L) 26.0 - 34.0 pg   MCHC 29.7 (L) 30.0 - 36.0 g/dL   RDW 14.9 11.5 - 15.5 %   Platelets 189 150 - 400 K/uL   nRBC 0.0 0.0 - 0.2 %    Comment: Performed at [facility], [place] [address] [place], [place] [address]  Differential     Status: None   Collection Time: 07/16/18  2:17 PM  Result Value Ref Range   Neutrophils Relative % 55 %   Neutro Abs 2.3 1.7 - 7.7 K/uL   Lymphocytes Relative 26 %   Lymphs Abs 1.1 0.7 - 4.0 K/uL   Monocytes Relative 12 %   Monocytes Absolute 0.5 0.1 - 1.0 K/uL   Eosinophils Relative 6 %   Eosinophils Absolute 0.3 0.0 - 0.5 K/uL   Basophils Relative 1 %   Basophils Absolute 0.0 0.0 - 0.1 K/uL   Immature Granulocytes 0 %   Abs Immature Granulocytes 0.01 0.00 - 0.07 K/uL    Comment: Performed at [facility] [address] [place], [place] [address]  Comprehensive metabolic panel     Status: Abnormal   Collection Time: 07/16/18  2:17 PM  Result Value Ref Range   Sodium 137 135 - 145 mmol/L   Potassium 3.6 3.5 - 5.1 mmol/L   Chloride 103 98 - 111 mmol/L   CO2 28 22 - 32 mmol/L   Glucose, Bld 108 (H) 70 - 99 mg/dL   BUN 14 8 - 23 mg/dL   Creatinine, Ser 1.41 (H) 0.61 - 1.24 mg/dL   Calcium 8.6 (L) 8.9 - 10.3  mg/dL   Total Protein 6.5 6.5 - 8.1 g/dL   Albumin 3.1 (L) 3.5 - 5.0 g/dL   AST 28 15 - 41 U/L   ALT 29 0 - 44 U/L   Alkaline Phosphatase 136 (H) 38 - 126 U/L   Total Bilirubin 0.7 0.3 - 1.2 mg/dL   GFR calc non Af Amer 51 (L) >60 mL/min   GFR  calc Af Amer 59 (L) >60 mL/min   Anion gap 6 5 - 15    Comment: Performed at [facility] [address] [place], [place] [address]  I-stat troponin, ED     Status: None   Collection Time: 07/16/18  2:24 PM  Result Value Ref Range   Troponin i, poc 0.02 0.00 - 0.08 ng/mL   Comment 3            Comment: Due to the release kinetics of cTnI, a negative result within the first hours of the onset of symptoms does not rule out myocardial infarction with certainty. If myocardial infarction is still suspected, repeat the test at appropriate intervals.   Urine rapid drug screen (hosp performed)     Status: None   Collection Time: 07/16/18  3:44 PM  Result Value Ref Range   Opiates NONE DETECTED NONE DETECTED   Cocaine NONE DETECTED NONE DETECTED   Benzodiazepines NONE DETECTED NONE DETECTED   Amphetamines NONE DETECTED NONE DETECTED   Tetrahydrocannabinol NONE DETECTED NONE DETECTED   Barbiturates NONE DETECTED NONE DETECTED    Comment: (NOTE) DRUG SCREEN FOR MEDICAL PURPOSES ONLY.  IF CONFIRMATION IS NEEDED FOR ANY PURPOSE, NOTIFY LAB WITHIN 5 DAYS. LOWEST DETECTABLE LIMITS FOR URINE DRUG SCREEN Drug Class                     Cutoff (ng/mL) Amphetamine and metabolites    1000 Barbiturate and metabolites    200 Benzodiazepine                 606 Tricyclics and metabolites     300 Opiates and metabolites        300 Cocaine and metabolites        300 THC                            50 Performed at [facility], [place] [address] [place], [place] [address]   Urinalysis, Routine w reflex microscopic     Status: None   Collection Time: 07/16/18  3:44 PM  Result Value Ref Range   Color, Urine YELLOW YELLOW   APPearance CLEAR  CLEAR   Specific Gravity, Urine 1.013 1.005 - 1.030   pH 7.0 5.0 - 8.0   Glucose, UA NEGATIVE NEGATIVE mg/dL   Hgb urine dipstick NEGATIVE NEGATIVE   Bilirubin Urine NEGATIVE NEGATIVE   Ketones, ur NEGATIVE NEGATIVE mg/dL   Protein, ur NEGATIVE NEGATIVE mg/dL   Nitrite NEGATIVE NEGATIVE   Leukocytes,Ua NEGATIVE NEGATIVE    Comment: Performed at [facility] [address] [place], [place] [address]   Ct Head Wo Contrast  Result Date: 07/16/2018 CLINICAL DATA:  Acute right leg numbness. EXAM: CT HEAD WITHOUT CONTRAST TECHNIQUE: Contiguous axial images were obtained from the base of the skull through the vertex without intravenous contrast. COMPARISON:  CT scan of November 07, 2017. FINDINGS: Brain: Mild chronic ischemic white matter disease is noted. No mass effect or midline shift is noted. Ventricular size is within normal limits. There is no evidence of mass lesion, hemorrhage or acute infarction. Vascular: No hyperdense vessel or unexpected calcification. Skull: Normal. Negative for fracture or focal lesion. Sinuses/Orbits: No acute finding. Other: None. IMPRESSION: Mild chronic ischemic white matter disease. No acute intracranial abnormality seen. Electronically Signed   By: Marijo Conception, M.D.   On: 07/16/2018 16:04   Mr Brain Wo Contrast  Result Date: 07/16/2018 CLINICAL DATA:  Right-sided weakness. EXAM: MRI HEAD WITHOUT CONTRAST TECHNIQUE: Multiplanar, multiecho pulse sequences of the brain and surrounding structures were obtained without intravenous contrast. COMPARISON:  Head CT 07/16/2018 Brain MRI 07/18/2017 FINDINGS: BRAIN: Small focus of acute ischemia within the tail of the left caudate nucleus. Mild diffusion weighted hyperintensity the right cerebellum without clear defect on ADC map. The midline structures are normal. No midline shift or other mass effect. Diffuse confluent hyperintense T2-weighted signal within the periventricular, deep and juxtacortical white matter, most  commonly due to chronic ischemic microangiopathy. There are areas of cystic change at the site of old infarcts in the deep gray nuclei and centrum semiovale. Generalized atrophy without lobar predilection. There are multiple foci of chronic microhemorrhage, predominantly within the thalami and basal ganglia, consistent with chronic hypertensive angiopathy. VASCULAR: Major intracranial arterial and venous sinus flow voids are normal. SKULL AND UPPER CERVICAL SPINE: Calvarial bone marrow signal is normal. There is no skull base mass. Visualized upper cervical spine and soft tissues are normal. SINUSES/ORBITS: No fluid levels or advanced mucosal thickening. No mastoid or middle ear effusion. The orbits are normal. IMPRESSION: 1. Small acute infarcts of the left caudate nucleus tail, which may exert an effect on the left internal capsule, in keeping with reported right-sided symptoms. 2. Small subacute infarct of the right cerebellum. 3. Central chronic microhemorrhages in a pattern consistent with hypertensive angiopathy. 4. No hemorrhage or mass effect. Electronically Signed   By: Ulyses Jarred M.D.   On: 07/16/2018 18:28   Prior MRA head from Feb 2019: Patent anterior and posterior intracranial circulation. No large vessel occlusion, aneurysm, or significant stenosis.  Echocardiogram 07/18/17: The left ventriuclar myocardium is hyperechogenic and right ventricular wall thickness is also increased. Consider an infiltrative process, such as amyloidosis.  MRI brain, 07/16/18: 1. Small acute infarcts of the left caudate nucleus tail, which may exert an effect on the left internal capsule, in keeping with reported right-sided symptoms. 2. Small subacute infarct of the right cerebellum. 3. Central chronic microhemorrhages in a pattern consistent with hypertensive angiopathy. 4. No hemorrhage or mass effect.  Assessment: 68 y.o. male presenting with right sided weakness 1. MRI reveals small acute infarcts of  the left caudate tail. A small late subacute infarction of the right cerebellum and chronic microhemorrhages are also seen. Overall appearance is most consistent with chronic hypertensive vasculopathy as the etiology for his new and old strokes. 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  2. Exam findings of right hemiparesis, right facial weakness and bradykinesia are consistent with the location of the acute stroke 3. 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 Classifiable as having failed ASA monotherapy 5. Stroke Risk Factors - HLD and prior stroke   Plan: 1. CTA of head and neck to assess for possible lupus vasculitis versus intracranial atherosclerosis.  2. TTE to assess for possible progression of the hyperechogenicity of the left ventricular myocardium and right ventricular wall increased thickness, which were seen on the echocardiogram performed one year ago 3. Cardiac telemetry 4. PT consult, OT consult, Speech consult 5. Prophylactic therapy- Switch ASA to Plavix 6. Continue atorvastatin 7. Frequent neuro checks 8. HgbA1c, fasting lipid panel 9. Out of permissive HTN time window   @Electronically  signed: Dr. Kerney Elbe 07/16/2018, 8:23 PM

## 2018-07-16 NOTE — ED Notes (Signed)
Report given to 3W RN. All questions answered

## 2018-07-17 ENCOUNTER — Inpatient Hospital Stay (HOSPITAL_COMMUNITY): Payer: Medicare Other

## 2018-07-17 ENCOUNTER — Other Ambulatory Visit (HOSPITAL_COMMUNITY): Payer: Medicare Other

## 2018-07-17 LAB — COMPREHENSIVE METABOLIC PANEL
ALBUMIN: 3 g/dL — AB (ref 3.5–5.0)
ALT: 28 U/L (ref 0–44)
AST: 26 U/L (ref 15–41)
Alkaline Phosphatase: 133 U/L — ABNORMAL HIGH (ref 38–126)
Anion gap: 8 (ref 5–15)
BUN: 12 mg/dL (ref 8–23)
CO2: 25 mmol/L (ref 22–32)
Calcium: 8.4 mg/dL — ABNORMAL LOW (ref 8.9–10.3)
Chloride: 105 mmol/L (ref 98–111)
Creatinine, Ser: 1.4 mg/dL — ABNORMAL HIGH (ref 0.61–1.24)
GFR calc Af Amer: 59 mL/min — ABNORMAL LOW (ref >60)
GFR calc non Af Amer: 51 mL/min — ABNORMAL LOW (ref >60)
Glucose, Bld: 119 mg/dL — ABNORMAL HIGH (ref 70–99)
Potassium: 3.6 mmol/L (ref 3.5–5.1)
Sodium: 138 mmol/L (ref 135–145)
Total Bilirubin: 0.8 mg/dL (ref 0.3–1.2)
Total Protein: 6.4 g/dL — ABNORMAL LOW (ref 6.5–8.1)

## 2018-07-17 LAB — HIV ANTIBODY (ROUTINE TESTING W REFLEX): HIV SCREEN 4TH GENERATION: NONREACTIVE

## 2018-07-17 LAB — CBC
HEMATOCRIT: 42.6 % (ref 39.0–52.0)
Hemoglobin: 13.1 g/dL (ref 13.0–17.0)
MCH: 25.8 pg — ABNORMAL LOW (ref 26.0–34.0)
MCHC: 30.8 g/dL (ref 30.0–36.0)
MCV: 83.9 fL (ref 80.0–100.0)
Platelets: 186 10*3/uL (ref 150–400)
RBC: 5.08 MIL/uL (ref 4.22–5.81)
RDW: 14.7 % (ref 11.5–15.5)
WBC: 4.2 10*3/uL (ref 4.0–10.5)
nRBC: 0 % (ref 0.0–0.2)

## 2018-07-17 LAB — HEMOGLOBIN A1C
Hgb A1c MFr Bld: 5.6 % (ref 4.8–5.6)
Mean Plasma Glucose: 114.02 mg/dL

## 2018-07-17 LAB — LIPID PANEL
Cholesterol: 148 mg/dL (ref 0–200)
HDL: 58 mg/dL (ref >40)
LDL Cholesterol: 82 mg/dL (ref 0–99)
Total CHOL/HDL Ratio: 2.6 RATIO
Triglycerides: 39 mg/dL (ref <150)
VLDL: 8 mg/dL (ref 0–40)

## 2018-07-17 MED ORDER — CLOPIDOGREL BISULFATE 75 MG PO TABS
75.0000 mg | ORAL_TABLET | Freq: Every day | ORAL | Status: DC
  Administered 2018-07-17 – 2018-07-18 (×2): 75 mg via ORAL
  Filled 2018-07-17 (×2): qty 1

## 2018-07-17 MED ORDER — SODIUM CHLORIDE 0.9 % IV SOLN
INTRAVENOUS | Status: DC
  Administered 2018-07-17: 16:00:00 via INTRAVENOUS

## 2018-07-17 MED ORDER — VITAMIN B-1 100 MG PO TABS
100.0000 mg | ORAL_TABLET | Freq: Every day | ORAL | Status: DC
  Administered 2018-07-17 – 2018-07-18 (×2): 100 mg via ORAL
  Filled 2018-07-17 (×2): qty 1

## 2018-07-17 MED ORDER — ASPIRIN EC 81 MG PO TBEC
81.0000 mg | DELAYED_RELEASE_TABLET | Freq: Every day | ORAL | Status: DC
  Administered 2018-07-18: 81 mg via ORAL
  Filled 2018-07-17: qty 1

## 2018-07-17 MED ORDER — IOPAMIDOL (ISOVUE-370) INJECTION 76%
75.0000 mL | Freq: Once | INTRAVENOUS | Status: AC | PRN
  Administered 2018-07-17: 75 mL via INTRAVENOUS

## 2018-07-17 MED ORDER — IOPAMIDOL (ISOVUE-370) INJECTION 76%
INTRAVENOUS | Status: AC
  Filled 2018-07-17: qty 100

## 2018-07-17 MED ORDER — FOLIC ACID 1 MG PO TABS
1.0000 mg | ORAL_TABLET | Freq: Every day | ORAL | Status: DC
  Administered 2018-07-17 – 2018-07-18 (×2): 1 mg via ORAL
  Filled 2018-07-17 (×2): qty 1

## 2018-07-17 NOTE — Progress Notes (Addendum)
STROKE TEAM PROGRESS NOTE   INTERVAL HISTORY No family is at the bedside.  He feels he is almost back to baseline. Still with R HP, arm>leg. States his dysarthria is baseline.   Vitals:   07/17/18 0235 07/17/18 0435 07/17/18 0930 07/17/18 1131  BP: 108/75 122/79 133/88 124/80  Pulse: 63 68  63  Resp: (!) 25 18    Temp:  98.6 F (37 C)  98.5 F (36.9 C)  TempSrc:  Oral  Oral  SpO2: 99% 100%  100%  Weight:      Height:        CBC:  Recent Labs  Lab 07/16/18 1417 07/17/18 0559  WBC 4.1 4.2  NEUTROABS 2.3  --   HGB 12.7* 13.1  HCT 42.8 42.6  MCV 84.4 83.9  PLT 189 300    Basic Metabolic Panel:  Recent Labs  Lab 07/16/18 1417 07/17/18 0559  NA 137 138  K 3.6 3.6  CL 103 105  CO2 28 25  GLUCOSE 108* 119*  BUN 14 12  CREATININE 1.41* 1.40*  CALCIUM 8.6* 8.4*   Lipid Panel:     Component Value Date/Time   CHOL 148 07/17/2018 0559   TRIG 39 07/17/2018 0559   HDL 58 07/17/2018 0559   CHOLHDL 2.6 07/17/2018 0559   VLDL 8 07/17/2018 0559   LDLCALC 82 07/17/2018 0559   HgbA1c:  Lab Results  Component Value Date   HGBA1C 5.6 07/17/2018   Urine Drug Screen:     Component Value Date/Time   LABOPIA NONE DETECTED 07/16/2018 1544   COCAINSCRNUR NONE DETECTED 07/16/2018 1544   LABBENZ NONE DETECTED 07/16/2018 1544   AMPHETMU NONE DETECTED 07/16/2018 1544   THCU NONE DETECTED 07/16/2018 1544   LABBARB NONE DETECTED 07/16/2018 1544    Alcohol Level     Component Value Date/Time   ETH <10 07/16/2018 1417    IMAGING Ct Head Wo Contrast  Result Date: 07/16/2018 CLINICAL DATA:  Acute right leg numbness. EXAM: CT HEAD WITHOUT CONTRAST TECHNIQUE: Contiguous axial images were obtained from the base of the skull through the vertex without intravenous contrast. COMPARISON:  CT scan of November 07, 2017. FINDINGS: Brain: Mild chronic ischemic white matter disease is noted. No mass effect or midline shift is noted. Ventricular size is within normal limits. There is no  evidence of mass lesion, hemorrhage or acute infarction. Vascular: No hyperdense vessel or unexpected calcification. Skull: Normal. Negative for fracture or focal lesion. Sinuses/Orbits: No acute finding. Other: None. IMPRESSION: Mild chronic ischemic white matter disease. No acute intracranial abnormality seen. Electronically Signed   By: Marijo Conception, M.D.   On: 07/16/2018 16:04   Mr Brain Wo Contrast  Result Date: 07/16/2018 CLINICAL DATA:  Right-sided weakness. EXAM: MRI HEAD WITHOUT CONTRAST TECHNIQUE: Multiplanar, multiecho pulse sequences of the brain and surrounding structures were obtained without intravenous contrast. COMPARISON:  Head CT 07/16/2018 Brain MRI 07/18/2017 FINDINGS: BRAIN: Small focus of acute ischemia within the tail of the left caudate nucleus. Mild diffusion weighted hyperintensity the right cerebellum without clear defect on ADC map. The midline structures are normal. No midline shift or other mass effect. Diffuse confluent hyperintense T2-weighted signal within the periventricular, deep and juxtacortical white matter, most commonly due to chronic ischemic microangiopathy. There are areas of cystic change at the site of old infarcts in the deep gray nuclei and centrum semiovale. Generalized atrophy without lobar predilection. There are multiple foci of chronic microhemorrhage, predominantly within the thalami and basal ganglia, consistent with  chronic hypertensive angiopathy. VASCULAR: Major intracranial arterial and venous sinus flow voids are normal. SKULL AND UPPER CERVICAL SPINE: Calvarial bone marrow signal is normal. There is no skull base mass. Visualized upper cervical spine and soft tissues are normal. SINUSES/ORBITS: No fluid levels or advanced mucosal thickening. No mastoid or middle ear effusion. The orbits are normal. IMPRESSION: 1. Small acute infarcts of the left caudate nucleus tail, which may exert an effect on the left internal capsule, in keeping with reported  right-sided symptoms. 2. Small subacute infarct of the right cerebellum. 3. Central chronic microhemorrhages in a pattern consistent with hypertensive angiopathy. 4. No hemorrhage or mass effect. Electronically Signed   By: Ulyses Jarred M.D.   On: 07/16/2018 18:28    PHYSICAL EXAM HEENT: [place]/AT. Depigmented patches noted.  Lungs: Respirations unlabored Ext: No edema  Neurologic Examination: Mental Status: Alert, fully oriented, thought content appropriate.  Speech fluent with intact comprehension. Naming intact. Repetition intact. Able to follow a 3 step directional command without difficulty. Moderate dysarthria.  Cranial Nerves: II:  Visual fields intact with no extinction to DSS. PERRL.  III,IV, VI: EOMI without nystagmus. No ptosis.  V,VII: Subtle lag on the right when asked to smile. Facial temp sensation equal bilaterally.   VIII: Hearing intact to conversation.  IX,X: Palate rises symmetrically XI: Head is midline  XII: Midline tongue extension  Motor: LUE and LLE 5/5 proximally and distally RUE  4+/5 proximally; 4+/5 grip RLE 4+/5  RUE mild drift. No drift RLE Sensory: Temp and light touch intact x 4 without extinction Cerebellar: No ataxia with FNF or H-S bilaterally. Slower movement on the right is noted.  Gait: Deferred   ASSESSMENT/PLAN [name] is a 68 y.o. 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   Stroke:  Small left caudate tail infarct secondary to small vessel disease source  CT head mild chronic WM disease.   MRI  Small L caudate tail. Small subacute R cerebellar infarct.  Small vessel disease.   CTA head & neck pending   2D Echo  pending   LDL 82  HgbA1c 5.6  Lovenox 40 mg sq daily for VTE prophylaxis  aspirin 81 mg daily (not taking consistently) prior to admission, now on aspirin 325 mg daily. Given mild stroke, recommend aspirin 81 mg and plavix 75 mg  daily x 3 weeks, then ASPIRIN alone (as not taking routinely PTA). Orders adjusted.   Therapy recommendations:  pending   Disposition:  pending   Ok for d/c from stroke standpoint once 2D, CTA resulted and therapy recommendations in place  Follow up Stroke Clinic 4 weeks. Order placed and added to d/c paperwork  Hypertension  Stable . Permissive hypertension (OK if < 220/120) but gradually normalize in 5-7 days . Long-term BP goal normotensive  Hyperlipidemia  Home meds:  lipitor 10  Increased to Lipitor 40 on admission  LDL 82, goal < 70  Ok to decrease lipitor dose to 20 at d/c. Discussed taking statin in the evening for optimal results.  Continue statin at discharge  Other Stroke Risk Factors  Advanced age  Cigarette smoker, advised to stop smoking. On nicotine patch.   ETOH use, hx ETOH abuse, advised to drink no more than 2 drink(s) a day  Hx stroke/TIA  06/2017 - slurred speech.  right anterior putamen infarct d/t SVD. Multiple old lacunes.   NSVT  Other Active Problems  lupus  Hospital  day # 1  Isaac Sabin, MSN, APRN, ANVP-BC, AGPCNP-BC Advanced Practice Stroke Nurse [place] for Schedule & Pager information 07/17/2018 1:00 PM   ATTENDING NOTE: I reviewed above note and agree with the assessment and plan. Pt was seen and examined.   68 year old male with history of discoid lupus, SVT, hyperlipidemia, CKD, light smoker admitted for right-sided weakness.  Patient had stroke in 07/2017 with slurred speech and falling.  MRI showed acute right small BG infarct, as well as chronic bilateral BG, CR, right CC infarcts and bilateral BG cerebral micro bleeds. MRA negative.  Carotid Doppler unremarkable.  EF 55 to 60%, LDL 124 and A1c 5.7.  ANA, double-stranded DNA, lupus anticoagulant as well as anti-cardiolipin and beta-2 glycoprotein antibody negative.  Homocystine slightly increased.  Patient was discharged with aspirin and  Lipitor.  On this admission, patient had mild right-sided weakness.  CT no acute abnormality.  UDS negative.  LDL 82 and A1c 5.6.  MRI showed left CR small acute infarct.  Multiple old lacunar infarcts as well as possible right cerebellum small subacute infarct versus T2 shine through.  CTA head and neck unremarkable.  2D echo pending.   On exam, patient awake alert orientated.  Language intact, following commands, normal naming and repetition.  Cranial nerve exam intact, facial symmetrical, no dysarthria.  Right upper extremity pronator drift and decreased dexterity of the right hand.  Right lower extremity 4+/5 proximal and distal.  Sensation symmetrical, right finger-to-nose mild dysmetria.  Gait not tested.  Patient admitted that he is not compliant with aspirin or Lipitor at home.  Patient stroke this time again likely due to small vessel disease given risk factors, multiple chronic lacunar infarcts and stroke location.  Recommend aspirin 81 and Plavix 75 DAPT for 3 weeks and then either aspirin or Plavix alone.  Continue Lipitor on discharge.  Patient admitted that he smokes 2 cigarettes every day, light smoker, however, smoking cessation education provided and he is willing to quit.  PT/OT, risk factor modification.  Neurology will sign off. Please call with questions. Pt will follow up with stroke clinic NP at [facility] in about 4 weeks. Thanks for the consult.   Rosalin Hawking, MD PhD Stroke Neurology 07/17/2018 3:06 PM     To contact Stroke Continuity provider, please refer to [email or web address] After hours, contact General Neurology

## 2018-07-17 NOTE — Plan of Care (Signed)
Patient progressing towards goals for discharge. Pt states he will remain compliant with plan of care once discharged to home.

## 2018-07-17 NOTE — Progress Notes (Signed)
PT Cancellation Note  Patient Details Name: Isaac Reyes MRN: [id number] DOB: December 12, 1950   Cancelled Treatment:    Reason Eval/Treat Not Completed: Patient at procedure or test/unavailable. Transport present to take pt to CT. PT will continue to f/u with pt acutely as available for evaluation.   [name] 07/17/2018, 9:50 AM

## 2018-07-17 NOTE — Progress Notes (Signed)
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  Also report worsening difficulty speaking. Patient was found to have acute stroke  Assessment & Plan:   Principal Problem:   CVA (cerebral vascular accident) [facility]) Active Problems:   Hyperlipidemia   Heavy smoker   Alcohol abuse   Benign essential HTN  1-Acute left  Caudate CVA; MRI; Small acute infarcts of the left caudate nucleus tail, which may exert an effect on the left internal capsule, in keeping with reported right-sided symptoms. Small subacute infarct of the right cerebellum. CTA: No large vessel occlusion. Echo pending UDS negative LDL 82 He was not compliant with aspirin prior to admission.. Neurology is recommending aspirin and Plavix for 3 weeks then aspirin alone Awaiting PT and OT recommendation  2-hyperlipidemia Continue with statins  History of alcohol use; He reports he drinks alcohol every other day.  No prior history of withdrawal Start thiamine and folic acid  Hypertension; permissive hypertension in the acute event of a stroke    Estimated body mass index is 26.24 kg/m as calculated from the following:   Height as of this encounter: 6\' 2"  (1.88 m).   Weight as of this encounter: 92.7 kg.   DVT prophylaxis: Lovenox Code Status: DNR Family Communication: Daughter who was at bedside Disposition Plan: Waiting echo, PT, OT evaluation  Consultants:  Neurology  Procedures:   Echo pending  Antimicrobials: None  Subjective: Still report difficulty with her speech.  Right-sided weakness  Objective: Vitals:   07/16/18 2345 07/17/18 0130 07/17/18 0235 07/17/18 0435  BP: 136/81  108/75  122/79  Pulse: 63 62 63 68  Resp: (!) 21 14 (!) 25 18  Temp: 98.1 F (36.7 C) 97.8 F (36.6 C)  98.6 F (37 C)  TempSrc: Oral Oral  Oral  SpO2: 100% 99% 99% 100%  Weight:      Height:       No intake or output data in the 24 hours ending 07/17/18 0951 Filed Weights   07/16/18 2145  Weight: 92.7 kg    Examination:  General exam: Appears calm and comfortable  Respiratory system: Clear to auscultation. Respiratory effort normal. Cardiovascular system: S1 & S2 heard, RRR. No JVD, murmurs, rubs, gallops or clicks. No pedal edema. Gastrointestinal system: Abdomen is nondistended, soft and nontender. No organomegaly or masses felt. Normal bowel sounds heard. Central nervous system: Alert and oriented.  Slurred speech, right side 4 out of 5 Extremities: Symmetric 5 x 5 power. Skin: No rashes, lesions or ulcers  Data Reviewed: I have personally reviewed following labs and imaging studies  CBC: Recent Labs  Lab 07/16/18 1417 07/17/18 0559  WBC 4.1 4.2  NEUTROABS 2.3  --   HGB 12.7* 13.1  HCT 42.8 42.6  MCV 84.4 83.9  PLT 189 671   Basic Metabolic Panel: Recent Labs  Lab 07/16/18 1417 07/17/18 0559  NA 137 138  K 3.6 3.6  CL 103 105  CO2 28 25  GLUCOSE 108* 119*  BUN 14 12  CREATININE 1.41* 1.40*  CALCIUM 8.6* 8.4*   GFR: Estimated Creatinine Clearance: 58.7 mL/min (A) (by C-G formula based on SCr of 1.4 mg/dL (H)). Liver Function Tests: Recent Labs  Lab 07/16/18 1417 07/17/18 0559  AST  28 26  ALT 29 28  ALKPHOS 136* 133*  BILITOT 0.7 0.8  PROT 6.5 6.4*  ALBUMIN 3.1* 3.0*   No results for input(s): LIPASE, AMYLASE in the last 168 hours. No results for input(s): AMMONIA in the last 168 hours. Coagulation Profile: Recent Labs  Lab 07/16/18 1417  INR 1.0   Cardiac Enzymes: No results for input(s): CKTOTAL, CKMB, CKMBINDEX, TROPONINI in the last 168 hours. BNP (last 3 results) No results for input(s): PROBNP in the last 8760 hours. HbA1C: Recent  Labs    07/17/18 0559  HGBA1C 5.6   CBG: No results for input(s): GLUCAP in the last 168 hours. Lipid Profile: Recent Labs    07/17/18 0559  CHOL 148  HDL 58  LDLCALC 82  TRIG 39  CHOLHDL 2.6   Thyroid Function Tests: No results for input(s): TSH, T4TOTAL, FREET4, T3FREE, THYROIDAB in the last 72 hours. Anemia Panel: No results for input(s): VITAMINB12, FOLATE, FERRITIN, TIBC, IRON, RETICCTPCT in the last 72 hours. Sepsis Labs: No results for input(s): PROCALCITON, LATICACIDVEN in the last 168 hours.  No results found for this or any previous visit (from the past 240 hour(s)).       Radiology Studies: Ct Head Wo Contrast  Result Date: 07/16/2018 CLINICAL DATA:  Acute right leg numbness. EXAM: CT HEAD WITHOUT CONTRAST TECHNIQUE: Contiguous axial images were obtained from the base of the skull through the vertex without intravenous contrast. COMPARISON:  CT scan of November 07, 2017. FINDINGS: Brain: Mild chronic ischemic white matter disease is noted. No mass effect or midline shift is noted. Ventricular size is within normal limits. There is no evidence of mass lesion, hemorrhage or acute infarction. Vascular: No hyperdense vessel or unexpected calcification. Skull: Normal. Negative for fracture or focal lesion. Sinuses/Orbits: No acute finding. Other: None. IMPRESSION: Mild chronic ischemic white matter disease. No acute intracranial abnormality seen. Electronically Signed   By: Marijo Conception, M.D.   On: 07/16/2018 16:04   Mr Brain Wo Contrast  Result Date: 07/16/2018 CLINICAL DATA:  Right-sided weakness. EXAM: MRI HEAD WITHOUT CONTRAST TECHNIQUE: Multiplanar, multiecho pulse sequences of the brain and surrounding structures were obtained without intravenous contrast. COMPARISON:  Head CT 07/16/2018 Brain MRI 07/18/2017 FINDINGS: BRAIN: Small focus of acute ischemia within the tail of the left caudate nucleus. Mild diffusion weighted hyperintensity the right cerebellum without  clear defect on ADC map. The midline structures are normal. No midline shift or other mass effect. Diffuse confluent hyperintense T2-weighted signal within the periventricular, deep and juxtacortical white matter, most commonly due to chronic ischemic microangiopathy. There are areas of cystic change at the site of old infarcts in the deep gray nuclei and centrum semiovale. Generalized atrophy without lobar predilection. There are multiple foci of chronic microhemorrhage, predominantly within the thalami and basal ganglia, consistent with chronic hypertensive angiopathy. VASCULAR: Major intracranial arterial and venous sinus flow voids are normal. SKULL AND UPPER CERVICAL SPINE: Calvarial bone marrow signal is normal. There is no skull base mass. Visualized upper cervical spine and soft tissues are normal. SINUSES/ORBITS: No fluid levels or advanced mucosal thickening. No mastoid or middle ear effusion. The orbits are normal. IMPRESSION: 1. Small acute infarcts of the left caudate nucleus tail, which may exert an effect on the left internal capsule, in keeping with reported right-sided symptoms. 2. Small subacute infarct of the right cerebellum. 3. Central chronic microhemorrhages in a pattern consistent with hypertensive angiopathy. 4. No hemorrhage or mass effect. Electronically Signed   By: Lennette Bihari  [place] M.D.   On: 07/16/2018 18:28        Scheduled Meds: . amLODipine  10 mg Oral Daily  . aspirin  300 mg Rectal Daily   Or  . aspirin  325 mg Oral Daily  . atorvastatin  40 mg Oral q1800  . enoxaparin (LOVENOX) injection  40 mg Subcutaneous Q24H  . losartan  50 mg Oral Daily  . nicotine  21 mg Transdermal Daily  . tamsulosin  0.4 mg Oral Daily   Continuous Infusions: . sodium chloride 75 mL/hr at 07/16/18 2305     LOS: 1 day    Time spent: 35 minutes    Elmarie Shiley, MD [facility]  07/17/2018, 9:51 AM

## 2018-07-17 NOTE — Evaluation (Signed)
Physical Therapy Evaluation Patient Details Name: Isaac Reyes MRN: [id number] DOB: 23-Nov-1950 Today's Date: 07/17/2018   History of Present Illness  Pt is a 68 y/o male admitted secondary to new onset of R sided weakness. 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    Clinical Impression  Pt presented supine in bed with HOB elevated, awake and willing to participate in therapy session. Prior to admission, pt reported that he was independent with all functional mobility and ADLs. Pt lives alone and is adamantly independent and unwilling to have anyone assist him if needed. Pt currently able to perform bed mobility with supervision, transfers with min guard and ambulate in hallway without an AD with constant min guard and occasional min A for stability and balance. Pt participated in stair training with min guard and use of hand rails, no LOB or need for physical assistance. Pt would continue to benefit from skilled physical therapy services at this time while admitted and after d/c to address the below listed limitations in order to improve overall safety and independence with functional mobility.     Follow Up Recommendations Home health PT    Equipment Recommendations  None recommended by PT    Recommendations for Other Services       Precautions / Restrictions Precautions Precautions: Fall Restrictions Weight Bearing Restrictions: No      Mobility  Bed Mobility Overal bed mobility: Needs Assistance Bed Mobility: Supine to Sit;Sit to Supine     Supine to sit: Supervision Sit to supine: Supervision      Transfers Overall transfer level: Needs assistance Equipment used: None Transfers: Sit to/from Stand Sit to Stand: Min guard         General transfer  comment: min guard for safety  Ambulation/Gait Ambulation/Gait assistance: Min guard;Min assist Gait Distance (Feet): 200 Feet Assistive device: None Gait Pattern/deviations: Step-through pattern;Decreased stride length;Wide base of support;Drifts right/left;Ataxic Gait velocity: decreased   General Gait Details: pt with modest instability without use of an AD, requiring constant close min guard and occasional min A for balance  Stairs Stairs: Yes Stairs assistance: Min guard Stair Management: Two rails;Alternating pattern;Forwards Number of Stairs: 2 General stair comments: min guard for safety, no difficulty with stairs  Wheelchair Mobility    Modified Rankin (Stroke Patients Only) Modified Rankin (Stroke Patients Only) Pre-Morbid Rankin Score: Slight disability Modified Rankin: Moderate disability     Balance Overall balance assessment: Needs assistance Sitting-balance support: Feet supported Sitting balance-Leahy Scale: Good     Standing balance support: No upper extremity supported Standing balance-Leahy Scale: Fair                               Pertinent Vitals/Pain Pain Assessment: No/denies pain Faces Pain Scale: No hurt    Home Living Family/patient expects to be discharged to:: Private residence Living Arrangements: Alone Available Help at Discharge: Family;Friend(s);Available PRN/intermittently   Home Access: Stairs to enter Entrance Stairs-Rails: Right;Left Entrance Stairs-Number of Steps: 4 Home Layout: One level Home Equipment: None      Prior Function Level of Independence: Independent               Hand Dominance        Extremity/Trunk Assessment   Upper Extremity Assessment Upper Extremity Assessment:  Defer to OT evaluation;Overall [facility] for tasks assessed    Lower Extremity Assessment Lower Extremity Assessment: Overall WFL for tasks assessed       Communication   Communication: Expressive difficulties   Cognition Arousal/Alertness: Awake/alert Behavior During Therapy: Impulsive Overall Cognitive Status: Impaired/Different from baseline Area of Impairment: Safety/judgement                         Safety/Judgement: Decreased awareness of deficits;Decreased awareness of safety            General Comments      Exercises     Assessment/Plan    PT Assessment Patient needs continued PT services  PT Problem List Decreased balance;Decreased mobility;Decreased coordination;Decreased safety awareness       PT Treatment Interventions DME instruction;Gait training;Stair training;Functional mobility training;Therapeutic activities;Therapeutic exercise;Balance training;Neuromuscular re-education;Patient/family education    PT Goals (Current goals can be found in the Care Plan section)  Acute Rehab PT Goals Patient Stated Goal: "to go home today" PT Goal Formulation: With patient Time For Goal Achievement: 07/31/18 Potential to Achieve Goals: Good    Frequency Min 4X/week   Barriers to discharge        Co-evaluation               AM-PAC PT "6 Clicks" Mobility  Outcome Measure Help needed turning from your back to your side while in a flat bed without using bedrails?: None Help needed moving from lying on your back to sitting on the side of a flat bed without using bedrails?: None Help needed moving to and from a bed to a chair (including a wheelchair)?: A Little Help needed standing up from a chair using your arms (e.g., wheelchair or bedside chair)?: A Little Help needed to walk in hospital room?: A Little Help needed climbing 3-5 steps with a railing? : A Little 6 Click Score: 20    End of Session Equipment Utilized During Treatment: Gait belt Activity Tolerance: Patient tolerated treatment well Patient left: in bed;with call bell/phone within reach;with bed alarm set Nurse Communication: Mobility status PT Visit Diagnosis: Other abnormalities of gait and  mobility (R26.89)    Time: 1327-1350 PT Time Calculation (min) (ACUTE ONLY): 23 min   Charges:   PT Evaluation $PT Eval Moderate Complexity: 1 Mod PT Treatments $Gait Training: 8-22 mins        Sherie Don, PT, DPT  Acute Rehabilitation Services Pager [phone] Office [place] 07/17/2018, 4:33 PM

## 2018-07-17 NOTE — Evaluation (Signed)
Speech Language Pathology Evaluation Patient Details Name: Isaac Reyes MRN: [id number] DOB: 1950/09/11 Today's Date: 07/17/2018 Time: 6195-0932 SLP Time Calculation (min) (ACUTE ONLY): 13 min  Problem List:  Patient Active Problem List   Diagnosis Date Noted  . Syncope 11/07/2017  . Benign essential HTN 07/26/2017  . Acute kidney failure ([place]) 07/24/2017  . CVA (cerebral vascular accident) ([place]) 07/19/2017  . Stroke (cerebrum) ([place]) 07/18/2017  . NSVT (nonsustained ventricular tachycardia) ([place]) 07/18/2017  . Elevated blood pressure reading 07/18/2017  . Normocytic normochromic anemia 07/18/2017  . Malnutrition of moderate degree 07/18/2017  . Normocytic anemia   . Frequent falls   . Renal insufficiency   . Hyperlipidemia   . Heavy smoker   . Alcohol abuse   . Abnormal EKG 03/19/2013  . Pre-operative cardiovascular exam, new EKG abnormalities c/w ischemia 03/19/2013  . LVH (left ventricular hypertrophy) 03/19/2013  . Murmur, cardiac 03/19/2013   Past Medical History:  Past Medical History:  Diagnosis Date  . Acute CVA (cerebrovascular accident) ([place]) 06/2017   "speech issues since" (07/24/2017)  . AKI (acute kidney injury) ([place]) 07/24/2017  . Anemia   . High cholesterol   . Lupus ([place])    "skin kind"  . NSVT (nonsustained ventricular tachycardia) ([facility])    Past Surgical History:  Past Surgical History:  Procedure Laterality Date  . KNEE ARTHROSCOPY    . TONSILLECTOMY     HPI:  Isaac Reyes is a 68 y.o. 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  Also difficulty speaking.  Patient said he has been lying down on the floor for at least 2 days watching TV.  He was unable to get up. MRI 2/26: "1. Small acute infarcts of the left caudate nucleus  tail, which may exert an effect on the left internal capsule, in keeping with reported right-sided symptoms. 2. Small subacute infarct of the right cerebellum. 3. Central chronic microhemorrhages in a pattern consistent with hypertensive angiopathy. 4. No hemorrhage or mass effect."   Assessment / Plan / Recommendation Clinical Impression  Pt presents with motor speech impairments characterized by articulatory imprecesion, low vocal intensity, and lingual and labial weakness consistent with UUMN dysarthria. Characteristics of apraxia of speech or oral apraxia were not noted. Pt has residual deficits from prior CVA.  He states that he feels that he has returned to baseline, but his daughter does not believe he has. Pt reports he did not work with Isaac Reyes following previous stroke, but that dysarthria bothers him and he would like to improve speech clarity.  Pt has decreasing intelligibility with length of utterance and does self correct when asked to repeat himself due to communication breakdown.   Reivewed dysarthria compensatory strategies with patient.  With request for repetition, pt intuitively slows down speech.  Pt reports that he is a quiet talker and does not believe that speaking louder will be a beneficial strategy for him.   Pt would like to work with SLP at next level of care to improve speech intelligibility.  Recommend outpatient or home health SLP services to address dysarthria.  Pt denies deficits in word finding or changes to cognition.  With confrontational naming task, pt utilized circumlocution strategy independently x1, and benefited from phonemic cuing with 2 of 8 items.     SLP Assessment  SLP Recommendation/Assessment: All  further Speech Lanaguage Pathology  needs can be addressed in the next venue of care SLP Visit Diagnosis: Dysarthria and anarthria (R47.1)    Follow Up Recommendations  Home health SLP;Outpatient SLP       SLP Evaluation Cognition    Appears WFL; no formal  assessment      Comprehension    Appears WFL; no formal assessment   Expression   Appears WFL; no formal assessment  Oral / Motor  Oral Motor/Sensory Function Overall Oral Motor/Sensory Function: Mild impairment Facial ROM: Reduced right Facial Symmetry: Abnormal symmetry right Lingual Strength: Reduced(R) Motor Speech Overall Motor Speech: Impaired Respiration: Within functional limits Articulation: Impaired Level of Impairment: Sentence Intelligibility: Intelligibility reduced Word: 75-100% accurate Phrase: 75-100% accurate Sentence: 50-74% accurate Conversation: 50-74% accurate Motor Planning: Witnin functional limits Motor Speech Errors: Aware Interfering Components: Premorbid status Effective Techniques: Slow rate;Pause;Over-articulate   Isaac Reyes, Isaac Reyes, [place] Office: [phone]; Pager (2/27): [phone] 07/17/2018, 3:40 PM

## 2018-07-17 NOTE — Progress Notes (Signed)
OT Cancellation Note  Patient Details Name: Isaac Reyes MRN: [id number] DOB: 23-Sep-1950   Cancelled Treatment:    Reason Eval/Treat Not Completed: Patient at procedure or test/ unavailable;Other (comment)(Transport present to take pt to CT. PT will continue to f/u with pt acutely as available for evaluation.)  Gypsy Decant 07/17/2018, 10:10 AM

## 2018-07-18 ENCOUNTER — Inpatient Hospital Stay (HOSPITAL_COMMUNITY): Payer: Medicare Other

## 2018-07-18 DIAGNOSIS — I6350 Cerebral infarction due to unspecified occlusion or stenosis of unspecified cerebral artery: Secondary | ICD-10-CM

## 2018-07-18 LAB — BASIC METABOLIC PANEL
Anion gap: 9 (ref 5–15)
BUN: 13 mg/dL (ref 8–23)
CO2: 23 mmol/L (ref 22–32)
Calcium: 7.9 mg/dL — ABNORMAL LOW (ref 8.9–10.3)
Chloride: 105 mmol/L (ref 98–111)
Creatinine, Ser: 1.43 mg/dL — ABNORMAL HIGH (ref 0.61–1.24)
GFR calc Af Amer: 58 mL/min — ABNORMAL LOW (ref >60)
GFR calc non Af Amer: 50 mL/min — ABNORMAL LOW (ref >60)
Glucose, Bld: 138 mg/dL — ABNORMAL HIGH (ref 70–99)
Potassium: 3.6 mmol/L (ref 3.5–5.1)
SODIUM: 137 mmol/L (ref 135–145)

## 2018-07-18 LAB — ECHOCARDIOGRAM COMPLETE
Height: 74 in
Weight: 3269.86 oz

## 2018-07-18 MED ORDER — CLOPIDOGREL BISULFATE 75 MG PO TABS: 75.0000 mg | ORAL_TABLET | Freq: Every day | ORAL | 0 refills | Status: DC

## 2018-07-18 MED ORDER — NYSTATIN 100000 UNIT/ML MT SUSP
5.0000 mL | Freq: Four times a day (QID) | OROMUCOSAL | Status: DC
  Administered 2018-07-18: 500000 [IU] via ORAL
  Filled 2018-07-18: qty 5

## 2018-07-18 MED ORDER — ATORVASTATIN CALCIUM 40 MG PO TABS: 40.0000 mg | ORAL_TABLET | Freq: Every day | ORAL | 0 refills | Status: DC

## 2018-07-18 MED ORDER — NYSTATIN 100000 UNIT/ML MT SUSP: 5.0000 mL | Freq: Four times a day (QID) | OROMUCOSAL | 0 refills | Status: DC

## 2018-07-18 NOTE — Progress Notes (Signed)
Reviewed AVS discharge instructions with patient/caregiver. Patient/caregiver verbalizes understanding of instructions received. AVS and prescriptions received by patient/caregiver.

## 2018-07-18 NOTE — Discharge Instructions (Signed)
You need to take aspirin and plavix for 3 weeks, then aspirin alone.

## 2018-07-18 NOTE — Evaluation (Addendum)
Occupational Therapy Evaluation Patient Details Name: Isaac Reyes MRN: [id number] DOB: [birth year] Today's Date: 07/18/2018    History of Present Illness Pt is a 68 y/o male admitted secondary to new onset of R sided weakness. 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   Clinical Impression   This 68 yo male admitted with above presents to acute OT with decreased balance, decreased mobility, decreased endurance, aware of deficits but not how they affect safety thus affecting her safety and independence with basic ADLs. He will benefit from acute OT with best follow up at [facility] but pt is currently only wanting to go home. We will continue to follow.Pt is not safe to go home by himself without further therapy on at an inpatient setting.    Follow Up Recommendations  CIR;Other (comment)(pt not agreeable to CIR or SNF so HHOT and HHaide, but really needs CIR/SNF)    Equipment Recommendations  Tub/shower seat       Precautions / Restrictions Precautions Precautions: Fall Restrictions Weight Bearing Restrictions: No      Mobility Bed Mobility Overal bed mobility: Needs Assistance Bed Mobility: Supine to Sit;Sit to Supine     Supine to sit: Supervision Sit to supine: Supervision   General bed mobility comments: for safety  Transfers Overall transfer level: Needs assistance Equipment used: Rolling walker (2 wheeled) Transfers: Sit to/from Stand Sit to Stand: Min assist         General transfer comment: increased time and effort and A due to initally with posterior lean    Balance Overall balance assessment: Needs assistance Sitting-balance support: Feet supported;No upper extremity supported Sitting balance-Leahy Scale: Good     Standing balance support: No  upper extremity supported Standing balance-Leahy Scale: Fair                             ADL either performed or assessed with clinical judgement   ADL Overall ADL's : Needs assistance/impaired Eating/Feeding: Independent;Sitting   Grooming: Min guard;Standing   Upper Body Bathing: Set up;Sitting   Lower Body Bathing: Minimal assistance;Sit to/from stand   Upper Body Dressing : Set up;Sitting   Lower Body Dressing: Minimal assistance;Sit to/from stand   Toilet Transfer: Minimal assistance;Ambulation;RW;Regular Toilet;Grab bars   Toileting- Clothing Manipulation and Hygiene: Minimal assistance;Sit to/from stand   Tub/ Shower Transfer: Minimal assistance;Ambulation;Grab bars;Walk-in shower           Vision Baseline Vision/History: No visual deficits Patient Visual Report: No change from baseline Vision Assessment?: Yes Eye Alignment: Within Functional Limits Ocular Range of Motion: Within Functional Limits Alignment/Gaze Preference: Within Defined Limits Tracking/Visual Pursuits: Other (comment)(can track in all directions but has a tendency when gets to end range for eyes to "jump" back to midline) Visual Fields: No apparent deficits Additional Comments: Able to read paragraph on card            Pertinent Vitals/Pain Pain Assessment: No/denies pain     Hand Dominance Right   Extremity/Trunk Assessment Upper Extremity Assessment Upper Extremity Assessment: Overall WFL for tasks assessed           Communication Communication Communication: No difficulties(pta, but now with expressive difficulties)   Cognition Arousal/Alertness: Awake/alert Behavior During Therapy: WFL for tasks assessed/performed Overall Cognitive Status: Impaired/Different from baseline Area of  Impairment: Safety/judgement                         Safety/Judgement: Decreased awareness of safety;Decreased awareness of deficits     General Comments: reports he does  feel weaker today, but not sure why. Will say his balance is worse, "but I'll be fine at home", got fatigued and reported SOB ambulating 100 feet (sats were stable)      Exercises Exercises: General Lower Extremity General Exercises - Lower Extremity Ankle Circles/Pumps: AROM;Strengthening;Right;10 reps;Seated Long Arc Quad: AROM;Strengthening;Right;10 reps;Seated Hip Flexion/Marching: AROM;Strengthening;Right;10 reps;Seated        Home Living Family/patient expects to be discharged to:: Private residence Living Arrangements: Alone Available Help at Discharge: Family;Friend(s);Available PRN/intermittently Type of Home: House Home Access: Stairs to enter [employer] of Steps: 4 Entrance Stairs-Rails: Right;Left Home Layout: One level     Bathroom Shower/Tub: Walk-in shower;Door   [employer] Toilet: Standard     Home Equipment: None          Prior Functioning/Environment Level of Independence: Independent        Comments: takes bus to wherever he needs to go        OT Problem List: Decreased activity tolerance;Impaired balance (sitting and/or standing);Cardiopulmonary status limiting activity;Decreased knowledge of use of DME or AE;Impaired vision/perception      OT Treatment/Interventions: Self-care/ADL training;DME and/or AE instruction;Patient/family education;Visual/perceptual remediation/compensation;Balance training    OT Goals(Current goals can be found in the care plan section) Acute Rehab OT Goals Patient Stated Goal: "to go home today"--I really want to get out of the hospital OT Goal Formulation: With patient Time For Goal Achievement: 08/01/18 Potential to Achieve Goals: Good  OT Frequency: Min 3X/week   Barriers to D/C: Decreased caregiver support             AM-PAC OT "6 Clicks" Daily Activity     Outcome Measure Help from another person eating meals?: None Help from another person taking care of personal grooming?: A Little Help  from another person toileting, which includes using toliet, bedpan, or urinal?: A Little Help from another person bathing (including washing, rinsing, drying)?: A Little Help from another person to put on and taking off regular upper body clothing?: A Little Help from another person to put on and taking off regular lower body clothing?: A Little 6 Click Score: 19   End of Session Equipment Utilized During Treatment: Gait belt;Rolling walker  Activity Tolerance: Patient tolerated treatment well(but was fatigued after ambulating 100 feet) Patient left: in bed;with call bell/phone within reach;with bed alarm set  OT Visit Diagnosis: Unsteadiness on feet (R26.81);Other abnormalities of gait and mobility (R26.89);Ataxia, unspecified (R27.0);Muscle weakness (generalized) (M62.81);Other (comment)(decreased gaze stability for tracking)                Time: 1228-1300 OT Time Calculation (min): 32 min Charges:  OT General Charges $OT Visit: 1 Visit OT Evaluation $OT Eval Moderate Complexity: 1 Mod OT Treatments $Self Care/Home Management : 8-22 mins  [address]en Circle, OTR/L Acute [employer] Pager [phone] Office [phone]     Almon Register 07/18/2018, 2:47 PM

## 2018-07-18 NOTE — Progress Notes (Signed)
Physical Therapy Treatment Patient Details Name: Isaac Reyes MRN: [id number] DOB: 1951-02-03 Today's Date: 07/18/2018    History of Present Illness Pt is a 68 y/o male admitted secondary to new onset of R sided weakness. 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    PT Comments    Pt demonstrating increasing weakness of R LE this session as compared to previous session. Additionally, pt demonstrating great difficulty initially with sitting balance and standing balance due to posterior lean with both. Pt also recognizing that he is much more unsteady this session as compared to previous. He remains adamant about returning home but is agreeable to having HHPT and a RW at d/c. Pt would continue to benefit from skilled physical therapy services at this time while admitted and after d/c to address the below listed limitations in order to improve overall safety and independence with functional mobility.    Follow Up Recommendations  Home health PT;Other (comment)(pt adamantly refusing CIR or SNF)     Equipment Recommendations  Rolling walker with 5" wheels    Recommendations for Other Services       Precautions / Restrictions Precautions Precautions: Fall Restrictions Weight Bearing Restrictions: No    Mobility  Bed Mobility Overal bed mobility: Needs Assistance Bed Mobility: Supine to Sit;Sit to Supine     Supine to sit: Supervision Sit to supine: Supervision   General bed mobility comments: for safety  Transfers Overall transfer level: Needs assistance Equipment used: Rolling walker (2 wheeled) Transfers: Sit to/from Stand Sit to Stand: Min guard;Mod assist         General transfer comment: increased time and effort, min guard for safety to transition into  standing; mod A to maintain upright standing initially due to posterior LOB  Ambulation/Gait Ambulation/Gait assistance: Min guard;Min assist Gait Distance (Feet): 75 Feet Assistive device: Rolling walker (2 wheeled) Gait Pattern/deviations: Step-to pattern;Decreased step length - left;Decreased stance time - right;Decreased step length - right;Decreased stride length;Decreased dorsiflexion - right;Decreased weight shift to right Gait velocity: decreased   General Gait Details: pt with modest instability with use of RW, frequent cueing for appropriate use of AD, cueing for improved step length on R   Stairs             Wheelchair Mobility    Modified Rankin (Stroke Patients Only) Modified Rankin (Stroke Patients Only) Pre-Morbid Rankin Score: Slight disability Modified Rankin: Moderately severe disability     Balance Overall balance assessment: Needs assistance Sitting-balance support: Feet supported Sitting balance-Leahy Scale: Good     Standing balance support: No upper extremity supported Standing balance-Leahy Scale: Fair                              Cognition Arousal/Alertness: Awake/alert Behavior During Therapy: Impulsive Overall Cognitive Status: Impaired/Different from baseline Area of Impairment: Safety/judgement                         Safety/Judgement: Decreased awareness of deficits;Decreased awareness of safety            Exercises General Exercises - Lower Extremity Ankle Circles/Pumps: AROM;Strengthening;Right;10 reps;Seated Long Arc Quad: AROM;Strengthening;Right;10 reps;Seated Hip Flexion/Marching: AROM;Strengthening;Right;10 reps;Seated    General Comments        Pertinent Vitals/Pain  Pain Assessment: No/denies pain    Home Living                      Prior Function            PT Goals (current goals can now be found in the care plan section) Acute Rehab PT Goals PT Goal Formulation: With  patient Time For Goal Achievement: 07/31/18 Potential to Achieve Goals: Good Progress towards PT goals: Progressing toward goals    Frequency    Min 4X/week      PT Plan Current plan remains appropriate    Co-evaluation              AM-PAC PT "6 Clicks" Mobility   Outcome Measure  Help needed turning from your back to your side while in a flat bed without using bedrails?: None Help needed moving from lying on your back to sitting on the side of a flat bed without using bedrails?: None Help needed moving to and from a bed to a chair (including a wheelchair)?: A Little Help needed standing up from a chair using your arms (e.g., wheelchair or bedside chair)?: A Little Help needed to walk in hospital room?: A Little Help needed climbing 3-5 steps with a railing? : A Little 6 Click Score: 20    End of Session Equipment Utilized During Treatment: Gait belt Activity Tolerance: Patient tolerated treatment well Patient left: in bed;with call bell/phone within reach;with bed alarm set Nurse Communication: Mobility status PT Visit Diagnosis: Other abnormalities of gait and mobility (R26.89)     Time: 4818-5631 PT Time Calculation (min) (ACUTE ONLY): 18 min  Charges:  $Gait Training: 8-22 mins                     Sherie Don, PT, DPT  Acute Rehabilitation Services Pager [phone] Office [place] 07/18/2018, 11:09 AM

## 2018-07-18 NOTE — Discharge Summary (Signed)
Physician Discharge Summary  Maximiano Lott [facility] FYB:[id number] DOB: Jul 24, 1950 DOA: 07/16/2018  PCP: [name], MD  Admit date: 07/16/2018 Discharge date: 07/18/2018  Admitted From: home  Disposition:  Home   Recommendations for Outpatient Follow-up:  1. Follow up with PCP in 1-2 weeks 2. Please obtain BMP/CBC in one week 3. Needs further risk factors modifications.   Home Health: yes.   Discharge Condition: stable.  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  Also report worsening difficulty speaking. Patient was found to have acute stroke  1-Acute left  Caudate CVA; MRI; Small acute infarcts of the left caudate nucleus tail, which may exert an effect on the left internal capsule, in keeping with reported right-sided symptoms. Small subacute infarct of the right cerebellum. CTA: No large vessel occlusion. Echo no source for embolism UDS negative LDL 82 He was not compliant with aspirin prior to admission.. Neurology is recommending aspirin and Plavix for 3 weeks then aspirin alone Home health ordered. PT, OT  2-hyperlipidemia Continue with statins, increase dose at discharge   History of alcohol use; He reports he drinks alcohol every other day.  No prior history of withdrawal Continue with  thiamine and folic acid  Hypertension; resume norvasc, cozaar.     Estimated body mass index is 26.24 kg/m as calculated from the following:   Height as of this encounter: 6\' 2"  (1.88 m).   Weight as of this encounter: 92.7 kg.   Discharge Diagnoses:  Principal Problem:   CVA (cerebral vascular accident) [facility]) Active Problems:   Hyperlipidemia   Heavy smoker   Alcohol abuse   Benign essential  HTN    Discharge Instructions  Discharge Instructions    Ambulatory referral to Neurology   Complete by:  As directed    Follow up with stroke clinic NP (Jessica Vanschaick or Cecille Rubin, if both not available, consider Dr. Antony Contras, Dr. Bess Harvest, or Dr. Sarina Ill) at [facility] in about 4 weeks.   Diet - low sodium heart healthy   Complete by:  As directed    Increase activity slowly   Complete by:  As directed      Allergies as of 07/18/2018   No Known Allergies     Medication List    STOP taking these medications   dextromethorphan-guaiFENesin 30-600 MG 12hr tablet Commonly known as:  [place] DM     TAKE these medications   amLODipine 10 MG tablet Commonly known as:  NORVASC Take 1 tablet (10 mg total) by mouth daily.   ASPIRIN LOW DOSE 81 MG EC tablet Generic drug:  aspirin Take 81 mg by mouth daily.   atorvastatin 40 MG tablet Commonly known as:  LIPITOR Take 1 tablet (40 mg total) by mouth daily at 6 PM. What changed:    medication strength  how much to take  when to take this   clopidogrel 75 MG tablet Commonly known as:  PLAVIX Take 1 tablet (75 mg total) by mouth daily. Start taking on:  July 19, 2018   losartan 50 MG tablet Commonly known as:  COZAAR Take 50 mg by mouth daily.   nystatin 100000 UNIT/ML suspension Commonly known as:  MYCOSTATIN Take 5 mLs (500,000 Units total) by mouth 4 (four) times daily.   tamsulosin 0.4 MG Caps  capsule Commonly known as:  FLOMAX Take 0.4 mg by mouth daily.      Follow-up Information    [facility] Follow up in 4 week(s).   Specialty:  Neurology Why:  stroke clinic. office will call with appt date and time.  Contact information: [address] [place] [phone]       [name], MD Follow up in 1 week(s).   Specialty:  Internal Medicine Contact information: [address]  [place] [address] [phone]        Pixie Casino, MD .   Specialty:  Cardiology Contact information: [address] [place] [address] [phone]          No Known Allergies  Consultations: Neurology   Procedures/Studies: Ct Angio Head W Or Wo Contrast  Result Date: 07/17/2018 CLINICAL DATA:  Stroke follow-up EXAM: CT ANGIOGRAPHY HEAD AND NECK TECHNIQUE: Multidetector CT imaging of the head and neck was performed using the standard protocol during bolus administration of intravenous contrast. Multiplanar CT image reconstructions and MIPs were obtained to evaluate the vascular anatomy. Carotid stenosis measurements (when applicable) are obtained utilizing NASCET criteria, using the distal internal carotid diameter as the denominator. CONTRAST:  13mL ISOVUE-370 IOPAMIDOL (ISOVUE-370) INJECTION 76% COMPARISON:  Head CT 07/16/2018 FINDINGS: CTA NECK FINDINGS SKELETON: There is no bony spinal canal stenosis. No lytic or blastic lesion. OTHER NECK: Normal pharynx, larynx and major salivary glands. No cervical lymphadenopathy. Unremarkable thyroid gland. UPPER CHEST: No pneumothorax or pleural effusion. No nodules or masses. AORTIC ARCH: There is no calcific atherosclerosis of the aortic arch. There is no aneurysm, dissection or hemodynamically significant stenosis of the visualized ascending aorta and aortic arch. Conventional 3 vessel aortic branching pattern. The visualized proximal subclavian arteries are widely patent. RIGHT CAROTID SYSTEM: --Common carotid artery: Widely patent origin without common carotid artery dissection or aneurysm. --Internal carotid artery: Normal without aneurysm, dissection or stenosis. --External carotid artery: No acute abnormality. LEFT CAROTID SYSTEM: --Common carotid artery: Widely patent origin without common carotid artery dissection or aneurysm. --Internal carotid artery: There is an ectatic segment of left internal carotid  artery with a diameter of 6 mm. --External carotid artery: No acute abnormality. VERTEBRAL ARTERIES: Codominant configuration. Both origins are normal. No dissection, occlusion or flow-limiting stenosis to the vertebrobasilar confluence. CTA HEAD FINDINGS POSTERIOR CIRCULATION: --Basilar artery: Normal. --Posterior cerebral arteries: Normal. Both are predominantly supplied by the posterior communicating arteries. --Superior cerebellar arteries: Normal. --Inferior cerebellar arteries: Normal anterior and posterior inferior cerebellar arteries. ANTERIOR CIRCULATION: --Intracranial internal carotid arteries: Normal. --Anterior cerebral arteries: Normal. Both A1 segments are present. Patent anterior communicating artery. --Middle cerebral arteries: Normal. --Posterior communicating arteries: Present bilaterally. VENOUS SINUSES: As permitted by contrast timing, patent. ANATOMIC VARIANTS: None DELAYED PHASE: Chronic ischemic microangiopathy and remote infarcts of the right thalamus and left centrum semiovale. Review of the MIP images confirms the above findings. IMPRESSION: 1. No emergent large vessel occlusion or high-grade stenosis. 2. Ectasia of the left internal carotid artery measuring 6 mm. Electronically Signed   By: Ulyses Jarred M.D.   On: 07/17/2018 13:46   Ct Head Wo Contrast  Result Date: 07/16/2018 CLINICAL DATA:  Acute right leg numbness. EXAM: CT HEAD WITHOUT CONTRAST TECHNIQUE: Contiguous axial images were obtained from the base of the skull through the vertex without intravenous contrast. COMPARISON:  CT scan of November 07, 2017. FINDINGS: Brain: Mild chronic ischemic white matter disease is noted. No mass effect or  midline shift is noted. Ventricular size is within normal limits. There is no evidence of mass lesion, hemorrhage or acute infarction. Vascular: No hyperdense vessel or unexpected calcification. Skull: Normal. Negative for fracture or focal lesion. Sinuses/Orbits: No acute finding. Other:  None. IMPRESSION: Mild chronic ischemic white matter disease. No acute intracranial abnormality seen. Electronically Signed   By: Marijo Conception, M.D.   On: 07/16/2018 16:04   Ct Angio Neck W Or Wo Contrast  Result Date: 07/17/2018 CLINICAL DATA:  Stroke follow-up EXAM: CT ANGIOGRAPHY HEAD AND NECK TECHNIQUE: Multidetector CT imaging of the head and neck was performed using the standard protocol during bolus administration of intravenous contrast. Multiplanar CT image reconstructions and MIPs were obtained to evaluate the vascular anatomy. Carotid stenosis measurements (when applicable) are obtained utilizing NASCET criteria, using the distal internal carotid diameter as the denominator. CONTRAST:  73mL ISOVUE-370 IOPAMIDOL (ISOVUE-370) INJECTION 76% COMPARISON:  Head CT 07/16/2018 FINDINGS: CTA NECK FINDINGS SKELETON: There is no bony spinal canal stenosis. No lytic or blastic lesion. OTHER NECK: Normal pharynx, larynx and major salivary glands. No cervical lymphadenopathy. Unremarkable thyroid gland. UPPER CHEST: No pneumothorax or pleural effusion. No nodules or masses. AORTIC ARCH: There is no calcific atherosclerosis of the aortic arch. There is no aneurysm, dissection or hemodynamically significant stenosis of the visualized ascending aorta and aortic arch. Conventional 3 vessel aortic branching pattern. The visualized proximal subclavian arteries are widely patent. RIGHT CAROTID SYSTEM: --Common carotid artery: Widely patent origin without common carotid artery dissection or aneurysm. --Internal carotid artery: Normal without aneurysm, dissection or stenosis. --External carotid artery: No acute abnormality. LEFT CAROTID SYSTEM: --Common carotid artery: Widely patent origin without common carotid artery dissection or aneurysm. --Internal carotid artery: There is an ectatic segment of left internal carotid artery with a diameter of 6 mm. --External carotid artery: No acute abnormality. VERTEBRAL ARTERIES:  Codominant configuration. Both origins are normal. No dissection, occlusion or flow-limiting stenosis to the vertebrobasilar confluence. CTA HEAD FINDINGS POSTERIOR CIRCULATION: --Basilar artery: Normal. --Posterior cerebral arteries: Normal. Both are predominantly supplied by the posterior communicating arteries. --Superior cerebellar arteries: Normal. --Inferior cerebellar arteries: Normal anterior and posterior inferior cerebellar arteries. ANTERIOR CIRCULATION: --Intracranial internal carotid arteries: Normal. --Anterior cerebral arteries: Normal. Both A1 segments are present. Patent anterior communicating artery. --Middle cerebral arteries: Normal. --Posterior communicating arteries: Present bilaterally. VENOUS SINUSES: As permitted by contrast timing, patent. ANATOMIC VARIANTS: None DELAYED PHASE: Chronic ischemic microangiopathy and remote infarcts of the right thalamus and left centrum semiovale. Review of the MIP images confirms the above findings. IMPRESSION: 1. No emergent large vessel occlusion or high-grade stenosis. 2. Ectasia of the left internal carotid artery measuring 6 mm. Electronically Signed   By: Ulyses Jarred M.D.   On: 07/17/2018 13:46   Mr Brain Wo Contrast  Result Date: 07/16/2018 CLINICAL DATA:  Right-sided weakness. EXAM: MRI HEAD WITHOUT CONTRAST TECHNIQUE: Multiplanar, multiecho pulse sequences of the brain and surrounding structures were obtained without intravenous contrast. COMPARISON:  Head CT 07/16/2018 Brain MRI 07/18/2017 FINDINGS: BRAIN: Small focus of acute ischemia within the tail of the left caudate nucleus. Mild diffusion weighted hyperintensity the right cerebellum without clear defect on ADC map. The midline structures are normal. No midline shift or other mass effect. Diffuse confluent hyperintense T2-weighted signal within the periventricular, deep and juxtacortical white matter, most commonly due to chronic ischemic microangiopathy. There are areas of cystic change  at the site of old infarcts in the deep gray nuclei and centrum semiovale. Generalized atrophy without lobar  predilection. There are multiple foci of chronic microhemorrhage, predominantly within the thalami and basal ganglia, consistent with chronic hypertensive angiopathy. VASCULAR: Major intracranial arterial and venous sinus flow voids are normal. SKULL AND UPPER CERVICAL SPINE: Calvarial bone marrow signal is normal. There is no skull base mass. Visualized upper cervical spine and soft tissues are normal. SINUSES/ORBITS: No fluid levels or advanced mucosal thickening. No mastoid or middle ear effusion. The orbits are normal. IMPRESSION: 1. Small acute infarcts of the left caudate nucleus tail, which may exert an effect on the left internal capsule, in keeping with reported right-sided symptoms. 2. Small subacute infarct of the right cerebellum. 3. Central chronic microhemorrhages in a pattern consistent with hypertensive angiopathy. 4. No hemorrhage or mass effect. Electronically Signed   By: Ulyses Jarred M.D.   On: 07/16/2018 18:28      Subjective: Wants to go home  Speech stable.   Discharge Exam: Vitals:   07/18/18 0757 07/18/18 1200  BP: (!) 143/86 (!) 145/78  Pulse: (!) 59 65  Resp: 16 16  Temp: (!) 97 F (36.1 C) 97.6 F (36.4 C)  SpO2: 100% 100%   Vitals:   07/17/18 2310 07/18/18 0326 07/18/18 0757 07/18/18 1200  BP: (!) 152/82 (!) 115/92 (!) 143/86 (!) 145/78  Pulse: 68 64 (!) 59 65  Resp: 16 16 16 16   Temp: 98.1 F (36.7 C) 98.2 F (36.8 C) (!) 97 F (36.1 C) 97.6 F (36.4 C)  TempSrc: Oral Oral Oral Oral  SpO2: 100% 97% 100% 100%  Weight:      Height:        General: Pt is alert, awake, not in acute distress Cardiovascular: RRR, S1/S2 +, no rubs, no gallops Respiratory: CTA bilaterally, no wheezing, no rhonchi Abdominal: Soft, NT, ND, bowel sounds + Extremities: no edema, no cyanosis Neuro; right side weakness, dysarthria.     The results of significant  diagnostics from this hospitalization (including imaging, microbiology, ancillary and laboratory) are listed below for reference.     Microbiology: No results found for this or any previous visit (from the past 240 hour(s)).   Labs: BNP (last 3 results) No results for input(s): BNP in the last 8760 hours. Basic Metabolic Panel: Recent Labs  Lab 07/16/18 1417 07/17/18 0559 07/18/18 0615  NA 137 138 137  K 3.6 3.6 3.6  CL 103 105 105  CO2 28 25 23   GLUCOSE 108* 119* 138*  BUN 14 12 13   CREATININE 1.41* 1.40* 1.43*  CALCIUM 8.6* 8.4* 7.9*   Liver Function Tests: Recent Labs  Lab 07/16/18 1417 07/17/18 0559  AST 28 26  ALT 29 28  ALKPHOS 136* 133*  BILITOT 0.7 0.8  PROT 6.5 6.4*  ALBUMIN 3.1* 3.0*   No results for input(s): LIPASE, AMYLASE in the last 168 hours. No results for input(s): AMMONIA in the last 168 hours. CBC: Recent Labs  Lab 07/16/18 1417 07/17/18 0559  WBC 4.1 4.2  NEUTROABS 2.3  --   HGB 12.7* 13.1  HCT 42.8 42.6  MCV 84.4 83.9  PLT 189 186   Cardiac Enzymes: No results for input(s): CKTOTAL, CKMB, CKMBINDEX, TROPONINI in the last 168 hours. BNP: Invalid input(s): POCBNP CBG: No results for input(s): GLUCAP in the last 168 hours. D-Dimer No results for input(s): DDIMER in the last 72 hours. Hgb A1c Recent Labs    07/17/18 0559  HGBA1C 5.6   Lipid Profile Recent Labs    07/17/18 0559  CHOL 148  HDL 58  LDLCALC  82  TRIG 39  CHOLHDL 2.6   Thyroid function studies No results for input(s): TSH, T4TOTAL, T3FREE, THYROIDAB in the last 72 hours.  Invalid input(s): FREET3 Anemia work up No results for input(s): VITAMINB12, FOLATE, FERRITIN, TIBC, IRON, RETICCTPCT in the last 72 hours. Urinalysis    Component Value Date/Time   COLORURINE YELLOW 07/16/2018 [place] 07/16/2018 1544   LABSPEC 1.013 07/16/2018 [place] 7.0 07/16/2018 1544   GLUCOSEU NEGATIVE 07/16/2018 1544   HGBUR NEGATIVE 07/16/2018 [place] 07/16/2018 1544   KETONESUR NEGATIVE 07/16/2018 1544   PROTEINUR NEGATIVE 07/16/2018 1544   NITRITE NEGATIVE 07/16/2018 1544   LEUKOCYTESUR NEGATIVE 07/16/2018 1544   Sepsis Labs Invalid input(s): PROCALCITONIN,  WBC,  LACTICIDVEN Microbiology No results found for this or any previous visit (from the past 240 hour(s)).   Time coordinating discharge: 40 minutes  SIGNED:   Elmarie Shiley, MD  [facility] 07/18/2018, 3:10 PM

## 2018-07-18 NOTE — Care Management Note (Addendum)
Case Management Note  Patient Details  Name: Isaac Reyes MRN: [id number] Date of Birth: 1950-09-18  Subjective/Objective:    Pt admitted with a stroke. He is from home alone.  DME at home: none Pt denies issues with home meds but does states he wasn't taking an ASA like he was suppose to be. Pt uses bus for transportation.                Action/Plan: PT/OT would like him to go to CIR or SNF but he is refusing. CM spoke to him and he again refused. He is agreeable to [facility]. Pt states he has a friend named: Larena Glassman that will stay with him at home. CM provided choice and he selected [facility]. Dorian Pod with [facility] notified and accepted the referral.  Pt with orders for walker and shower stool. James with [facility] made aware and will deliver to the room. Pt without transport home. CM will provide cab voucher to bedside RN.  Addendum ([id number]): pt wanting to refuse all DME. CM has attempted to talk him into a walker. He is adamantly refusing shower stool.  Expected Discharge Date:  07/18/18               Expected Discharge Plan:  [place]  In-House Referral:     Discharge planning Services  CM Consult  Post Acute Care Choice:  Durable Medical Equipment, Home Health Choice offered to:  Patient  DME Arranged:  Shower stool, Walker rolling DME Agency:  [place]:  RN, PT, OT, Nurse's Aide, Speech Therapy, Social Work [employer] Agency:  [facility]  Status of Service:  Completed, signed off  If discussed at [employer], dates discussed:    Additional Comments:  Pollie Friar, RN 07/18/2018, 3:40 PM

## 2018-07-18 NOTE — Progress Notes (Signed)
  Echocardiogram 2D Echocardiogram has been performed.  Jennette Dubin 07/18/2018, 11:46 AM

## 2018-08-09 IMAGING — CT CT HEAD W/O CM
4 series · 15 of 47 positions shown, 17 images · non-contrast
Comparison: CT 07/17/2017

CLINICAL DATA: Altered level of consciousness.  Syncope

EXAM:
CT HEAD WITHOUT CONTRAST
TECHNIQUE: Contiguous axial images were obtained from the base of the skull
through the vertex without intravenous contrast.

[Series 3: head without · axial · non-contrast · 0.46mm/px · z∈[-45,+75]mm · 7 of 32 slices shown, 9 images]
[im 4/32  brain]
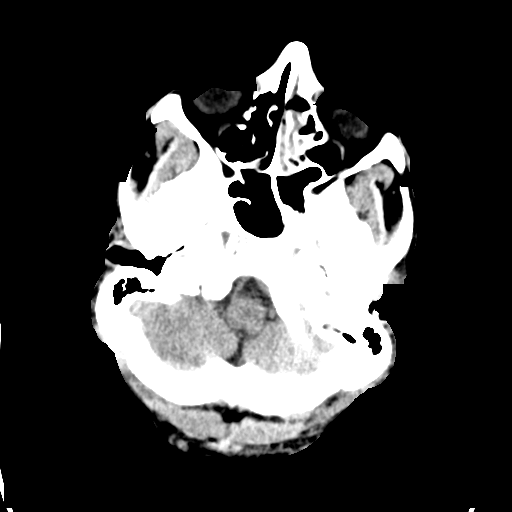
[im 4/32  bone]
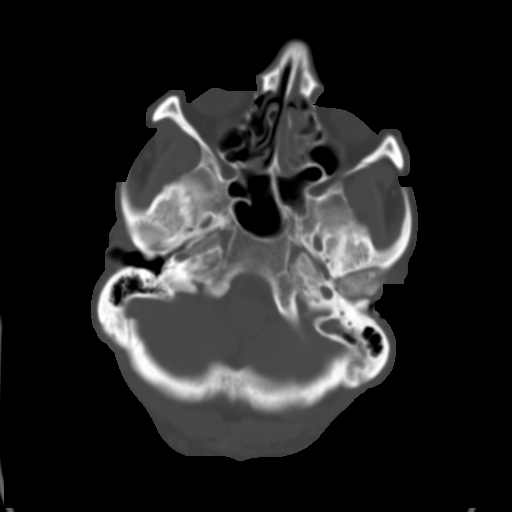
[im 8/32  brain]
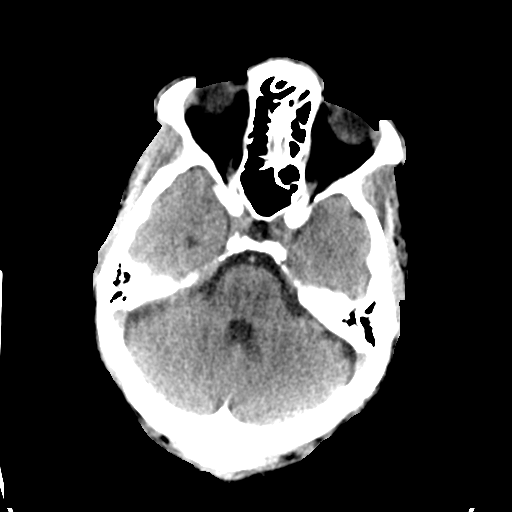
[im 12/32  brain]
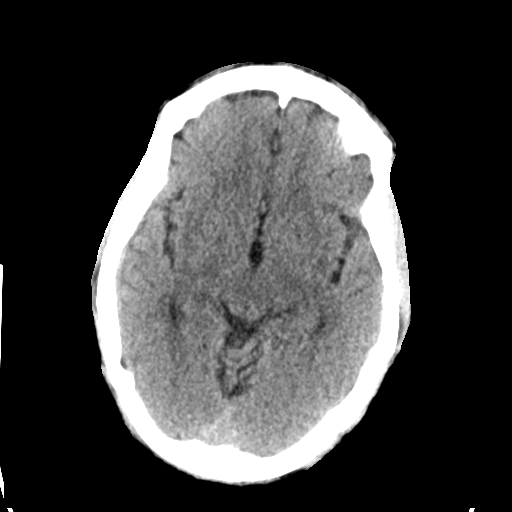
[im 16/32  brain]
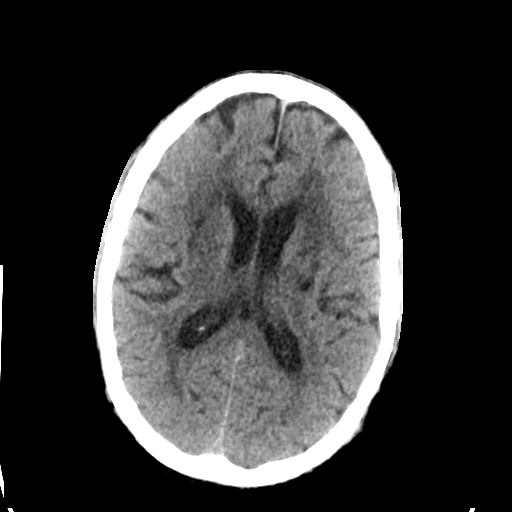
[im 20/32  brain]
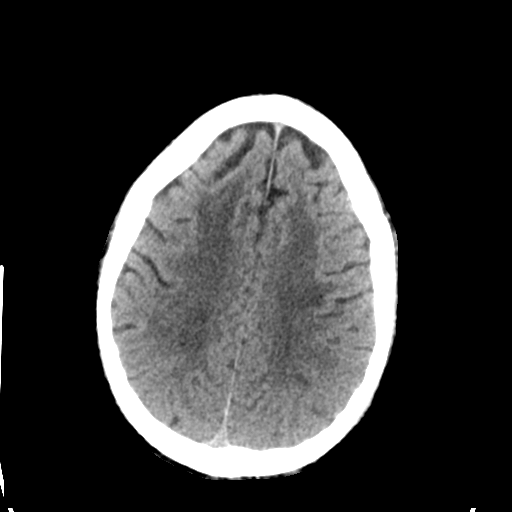
[im 20/32  bone]
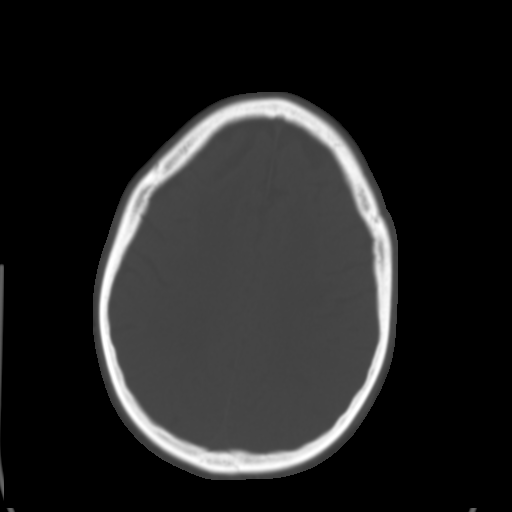
[im 24/32  brain]
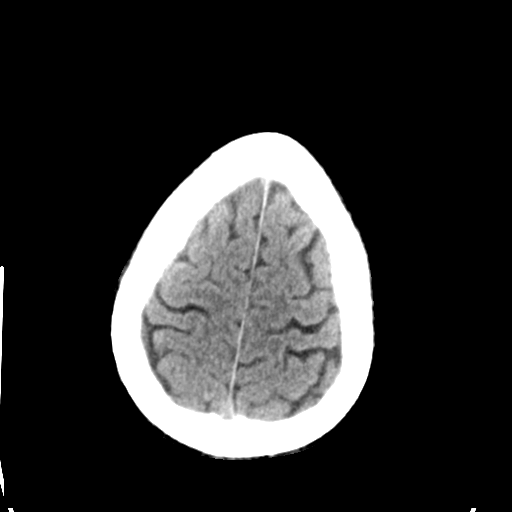
[im 28/32  brain]
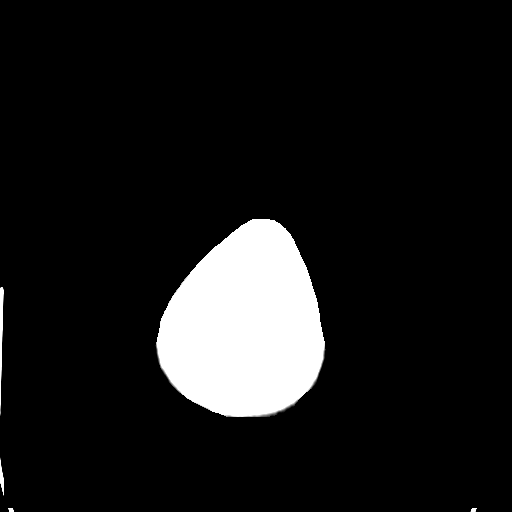

[Series 4: head bone · axial · 0.46mm/px · z∈[-46,-30]mm · 2 of 80 slices shown]
[im 8/80  bone]
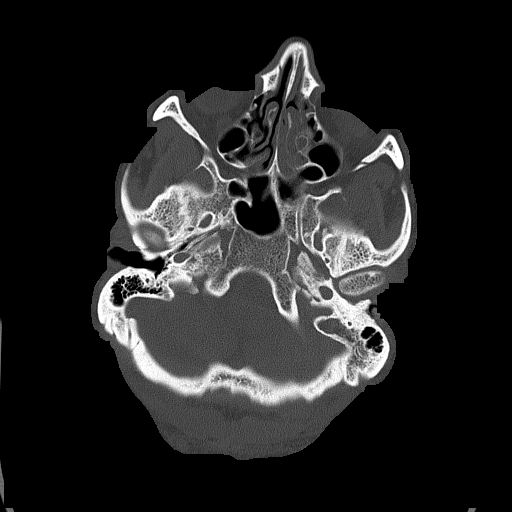
[im 16/80  bone]
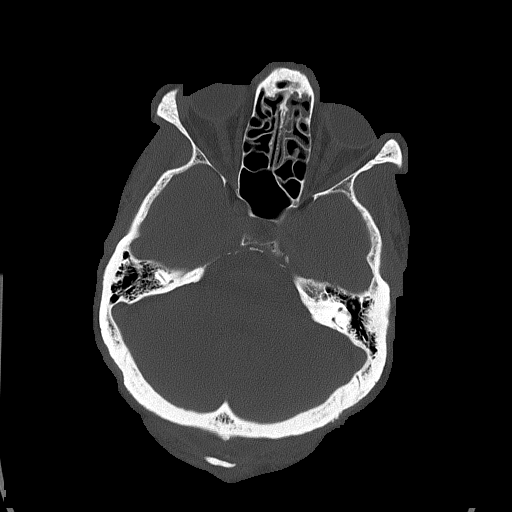

[Series 5: head without cor · coronal · non-contrast · 0.31mm/px · 3 of 70 slices shown]
[im 24/70  brain]
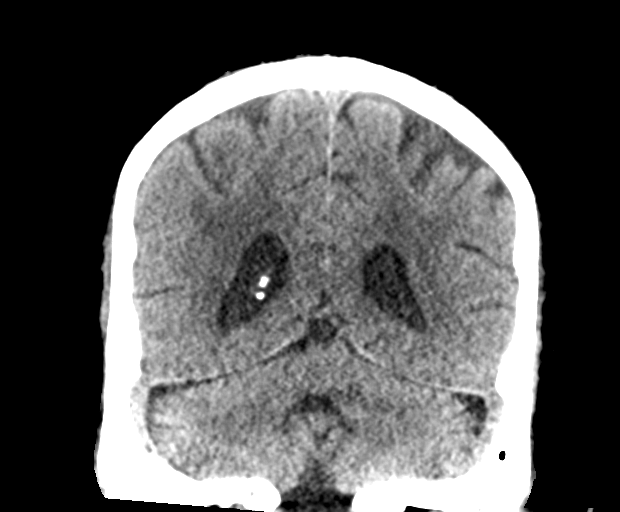
[im 31/70  brain]
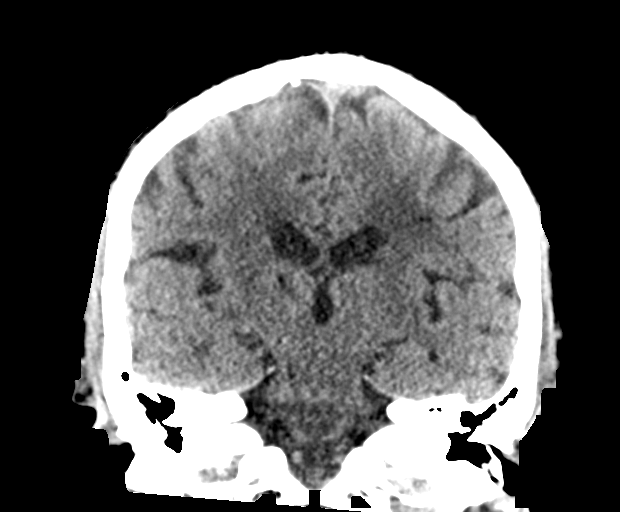
[im 39/70  brain]
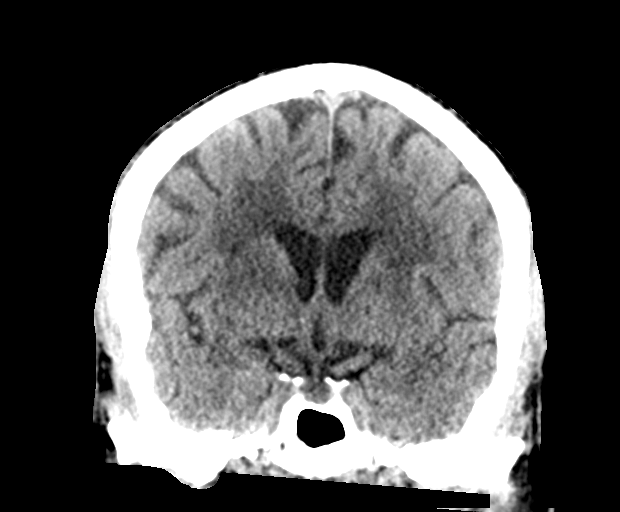

[Series 6: head without sag · sagittal · non-contrast · 0.31mm/px · 3 of 64 slices shown]
[im 22/64  brain]
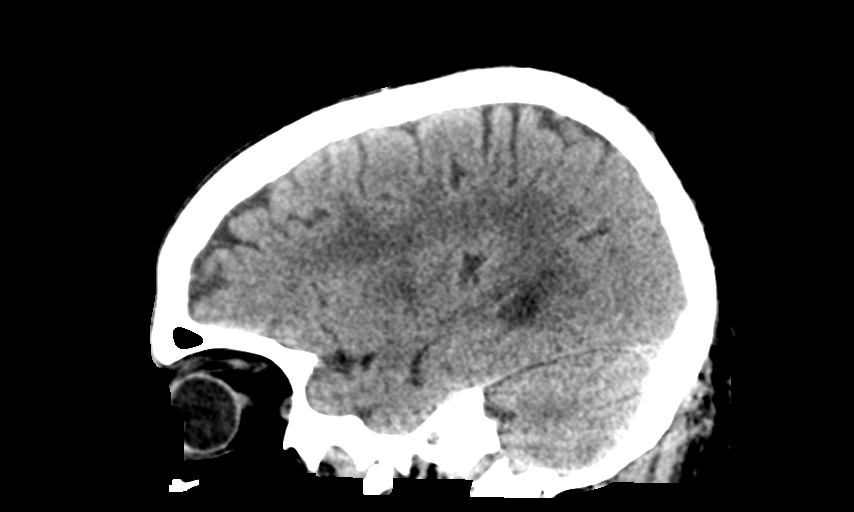
[im 32/64  brain]
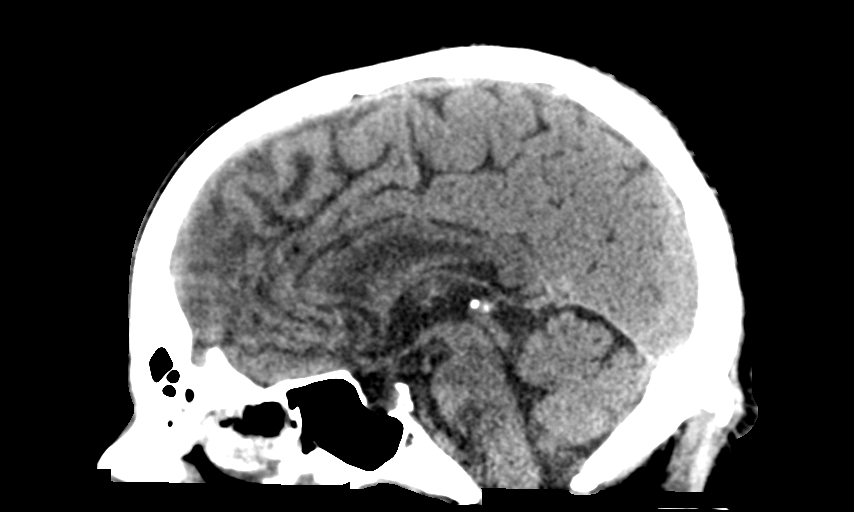
[im 43/64  brain]
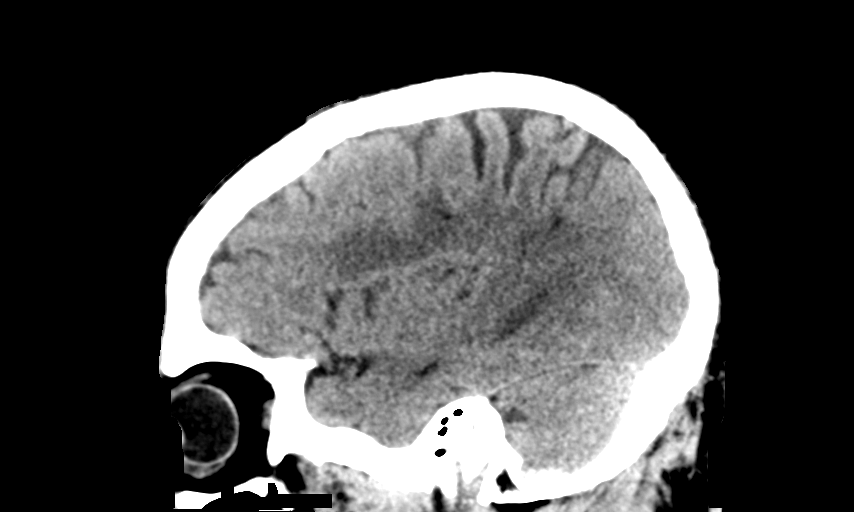

[15 of 47 positions shown; findings below may reference images not displayed]

FINDINGS: Brain: Ventricle size normal. Moderate chronic microvascular
ischemic changes in the white matter and basal ganglia bilaterally,
as noted previously. Negative for acute infarct. Negative for acute
hemorrhage mass or edema. No midline shift.

Vascular: Negative for hyperdense vessel

Skull: Negative

Sinuses/Orbits: Mild mucosal edema paranasal sinuses.  Normal orbit

Other: None
IMPRESSION: No acute abnormality. Moderate chronic microvascular ischemic
changes unchanged from prior studies.

## 2018-10-28 ENCOUNTER — Other Ambulatory Visit: Payer: Self-pay | Admitting: *Deleted

## 2018-10-28 DIAGNOSIS — Z20822 Contact with and (suspected) exposure to covid-19: Secondary | ICD-10-CM

## 2018-10-29 LAB — NOVEL CORONAVIRUS, NAA: SARS-CoV-2, NAA: NOT DETECTED

## 2018-12-03 DIAGNOSIS — R6889 Other general symptoms and signs: Secondary | ICD-10-CM | POA: Diagnosis not present

## 2018-12-03 DIAGNOSIS — N400 Enlarged prostate without lower urinary tract symptoms: Secondary | ICD-10-CM | POA: Diagnosis not present

## 2018-12-03 DIAGNOSIS — N183 Chronic kidney disease, stage 3 (moderate): Secondary | ICD-10-CM | POA: Diagnosis not present

## 2018-12-03 DIAGNOSIS — I129 Hypertensive chronic kidney disease with stage 1 through stage 4 chronic kidney disease, or unspecified chronic kidney disease: Secondary | ICD-10-CM | POA: Diagnosis not present

## 2018-12-03 DIAGNOSIS — I6930 Unspecified sequelae of cerebral infarction: Secondary | ICD-10-CM | POA: Diagnosis not present

## 2018-12-03 DIAGNOSIS — E7800 Pure hypercholesterolemia, unspecified: Secondary | ICD-10-CM | POA: Diagnosis not present

## 2018-12-22 ENCOUNTER — Encounter (HOSPITAL_COMMUNITY): Payer: Self-pay | Admitting: Emergency Medicine

## 2018-12-22 ENCOUNTER — Other Ambulatory Visit: Payer: Self-pay

## 2018-12-22 ENCOUNTER — Emergency Department (HOSPITAL_COMMUNITY)
Admission: EM | Admit: 2018-12-22 | Discharge: 2018-12-22 | Disposition: A | Discharge status: Home / Self Care | Payer: Medicare Other | Attending: Emergency Medicine | Admitting: Emergency Medicine

## 2018-12-22 DIAGNOSIS — R531 Weakness: Secondary | ICD-10-CM | POA: Diagnosis not present

## 2018-12-22 DIAGNOSIS — N179 Acute kidney failure, unspecified: Secondary | ICD-10-CM

## 2018-12-22 DIAGNOSIS — R55 Syncope and collapse: Secondary | ICD-10-CM

## 2018-12-22 DIAGNOSIS — Z7902 Long term (current) use of antithrombotics/antiplatelets: Secondary | ICD-10-CM | POA: Diagnosis not present

## 2018-12-22 DIAGNOSIS — Z79899 Other long term (current) drug therapy: Secondary | ICD-10-CM | POA: Diagnosis not present

## 2018-12-22 DIAGNOSIS — R42 Dizziness and giddiness: Secondary | ICD-10-CM | POA: Diagnosis not present

## 2018-12-22 DIAGNOSIS — Z7982 Long term (current) use of aspirin: Secondary | ICD-10-CM | POA: Diagnosis not present

## 2018-12-22 DIAGNOSIS — I10 Essential (primary) hypertension: Secondary | ICD-10-CM | POA: Insufficient documentation

## 2018-12-22 DIAGNOSIS — Z8673 Personal history of transient ischemic attack (TIA), and cerebral infarction without residual deficits: Secondary | ICD-10-CM | POA: Diagnosis not present

## 2018-12-22 DIAGNOSIS — Z87891 Personal history of nicotine dependence: Secondary | ICD-10-CM | POA: Diagnosis not present

## 2018-12-22 DIAGNOSIS — R001 Bradycardia, unspecified: Secondary | ICD-10-CM | POA: Diagnosis not present

## 2018-12-22 DIAGNOSIS — W19XXXA Unspecified fall, initial encounter: Secondary | ICD-10-CM | POA: Diagnosis not present

## 2018-12-22 LAB — CBC
HCT: 42.2 % (ref 39.0–52.0)
Hemoglobin: 12.8 g/dL — ABNORMAL LOW (ref 13.0–17.0)
MCH: 26 pg (ref 26.0–34.0)
MCHC: 30.3 g/dL (ref 30.0–36.0)
MCV: 85.6 fL (ref 80.0–100.0)
Platelets: 174 10*3/uL (ref 150–400)
RBC: 4.93 MIL/uL (ref 4.22–5.81)
RDW: 16 % — ABNORMAL HIGH (ref 11.5–15.5)
WBC: 5 10*3/uL (ref 4.0–10.5)
nRBC: 0 % (ref 0.0–0.2)

## 2018-12-22 LAB — BASIC METABOLIC PANEL
Anion gap: 10 (ref 5–15)
BUN: 18 mg/dL (ref 8–23)
CO2: 24 mmol/L (ref 22–32)
Calcium: 8.7 mg/dL — ABNORMAL LOW (ref 8.9–10.3)
Chloride: 106 mmol/L (ref 98–111)
Creatinine, Ser: 1.76 mg/dL — ABNORMAL HIGH (ref 0.61–1.24)
GFR calc Af Amer: 45 mL/min — ABNORMAL LOW (ref >60)
GFR calc non Af Amer: 39 mL/min — ABNORMAL LOW (ref >60)
Glucose, Bld: 105 mg/dL — ABNORMAL HIGH (ref 70–99)
Potassium: 3.6 mmol/L (ref 3.5–5.1)
Sodium: 140 mmol/L (ref 135–145)

## 2018-12-22 MED ORDER — SODIUM CHLORIDE 0.9% FLUSH: 3.0000 mL | Freq: Once | INTRAVENOUS | Status: DC

## 2018-12-22 MED ORDER — LACTATED RINGERS IV BOLUS: 1000.0000 mL | Freq: Once | INTRAVENOUS | Status: DC

## 2018-12-22 NOTE — ED Notes (Signed)
Pt verbalized understanding of discharge instructions and denies any further questions at this time.   

## 2018-12-22 NOTE — ED Notes (Signed)
Pt continuing to ask to leave. Resident now at bedside. The patient has d/c his own IV and came out to the hallway dressed with his cane.

## 2018-12-22 NOTE — ED Provider Notes (Signed)
[facility] EMERGENCY DEPARTMENT Provider Note   CSN: [id number] Arrival date & time: 12/22/18  1217    History   Chief Complaint Chief Complaint  Patient presents with  . Loss of Consciousness    HPI Isaac Reyes is a 68 y.o. male with history of CVA, syncope, and HTN presents to the ED via EMS complaining of near syncopal episode earlier today while walking outside in the heat.  Patient reports he walked to the [employer] to pay his bills and then was walking to the bus stop when he  "became overheated" and fell to his knees.  Patient reports he did not fully lose consciousness and reports the entire episode.  He denies any prodromal symptoms but does report he was sweating during the episode.  He denies falling or sustaining any injuries from the episode.  He reports the only fluid he has had today is a small amount of orange juice at breakfast.  He denies any chest pain, palpitations, shortness of breath, recent illness, or any other complaints.     The history is provided by the patient.    Past Medical History:  Diagnosis Date  . Acute CVA (cerebrovascular accident) ([place]) 06/2017   "speech issues since" (07/24/2017)  . AKI (acute kidney injury) ([place]) 07/24/2017  . Anemia   . High cholesterol   . Lupus ([place])    "skin kind"  . NSVT (nonsustained ventricular tachycardia) [facility])     Patient Active Problem List   Diagnosis Date Noted  . Syncope 11/07/2017  . Benign essential HTN 07/26/2017  . Acute kidney failure ([place]) 07/24/2017  . CVA (cerebral vascular accident) ([place]) 07/19/2017  . Stroke (cerebrum) ([place]) 07/18/2017  . NSVT (nonsustained ventricular tachycardia) ([place]) 07/18/2017  . Elevated blood pressure reading 07/18/2017  . Normocytic normochromic anemia 07/18/2017  . Malnutrition of moderate degree 07/18/2017  . Normocytic anemia   . Frequent falls   . Renal insufficiency   . Hyperlipidemia   . Heavy smoker   . Alcohol abuse   .  Abnormal EKG 03/19/2013  . Pre-operative cardiovascular exam, new EKG abnormalities c/w ischemia 03/19/2013  . LVH (left ventricular hypertrophy) 03/19/2013  . Murmur, cardiac 03/19/2013    Past Surgical History:  Procedure Laterality Date  . KNEE ARTHROSCOPY    . TONSILLECTOMY          Home Medications    Prior to Admission medications   Medication Sig Start Date End Date Taking? Authorizing Provider  amLODipine (NORVASC) 10 MG tablet Take 1 tablet (10 mg total) by mouth daily. 07/27/17   [name], MD  ASPIRIN LOW DOSE 81 MG EC tablet Take 81 mg by mouth daily. 09/26/17   [provider]  atorvastatin (LIPITOR) 40 MG tablet Take 1 tablet (40 mg total) by mouth daily at 6 PM. 07/18/18   Regalado, Belkys A, MD  clopidogrel (PLAVIX) 75 MG tablet Take 1 tablet (75 mg total) by mouth daily. 07/19/18   Regalado, Belkys A, MD  losartan (COZAAR) 50 MG tablet Take 50 mg by mouth daily. 09/27/17   [provider]  nystatin (MYCOSTATIN) 100000 UNIT/ML suspension Take 5 mLs (500,000 Units total) by mouth 4 (four) times daily. 07/18/18   Regalado, Belkys A, MD  tamsulosin (FLOMAX) 0.4 MG CAPS capsule Take 0.4 mg by mouth daily. 10/03/17   [provider]    Family History Family History  Problem Relation Age of Onset  . Heart attack Father     Social  History Social History   Tobacco Use  . Smoking status: Former Smoker    Packs/day: 0.12    Years: 45.00    Pack years: 5.40    Types: Cigars, Cigarettes    Quit date: 10/19/2016    Years since quitting: 2.1  . Smokeless tobacco: Never Used  Substance Use Topics  . Alcohol use: Yes    Alcohol/week: 3.0 standard drinks    Types: 3 Standard drinks or equivalent per week  . Drug use: No     Allergies   Patient has no known allergies.   Review of Systems Review of Systems  Constitutional: Positive for fatigue. Negative for chills and fever.  HENT: Negative for ear pain and sore throat.   Eyes: Negative  for pain and visual disturbance.  Respiratory: Negative for cough and shortness of breath.   Cardiovascular: Negative for chest pain and palpitations.  Gastrointestinal: Negative for abdominal pain and vomiting.  Genitourinary: Negative for dysuria and hematuria.  Musculoskeletal: Negative for arthralgias and back pain.  Skin: Negative for color change and rash.  Neurological: Negative for seizures and syncope.  All other systems reviewed and are negative.    Physical Exam Updated Vital Signs Ht 6\' 2"  (1.88 m)   Wt 96.6 kg   SpO2 99%   BMI 27.35 kg/m   Physical Exam Vitals signs and nursing note reviewed.  Constitutional:      Appearance: Normal appearance. He is well-developed.  HENT:     Head: Normocephalic and atraumatic.     Nose: Nose normal. No congestion or rhinorrhea.     Mouth/Throat:     Mouth: Mucous membranes are moist.     Pharynx: Oropharynx is clear. No oropharyngeal exudate or posterior oropharyngeal erythema.  Eyes:     Extraocular Movements: Extraocular movements intact.     Conjunctiva/sclera: Conjunctivae normal.     Pupils: Pupils are equal, round, and reactive to light.  Neck:     Musculoskeletal: Normal range of motion and neck supple. No neck rigidity.  Cardiovascular:     Rate and Rhythm: Normal rate and regular rhythm.     Pulses: Normal pulses.     Heart sounds: Normal heart sounds. No murmur. No friction rub. No gallop.   Pulmonary:     Effort: Pulmonary effort is normal. No respiratory distress.     Breath sounds: Normal breath sounds. No stridor. No wheezing, rhonchi or rales.  Chest:     Chest wall: No tenderness.  Abdominal:     General: Abdomen is flat. There is no distension.     Palpations: Abdomen is soft.     Tenderness: There is no abdominal tenderness. There is no guarding or rebound.  Musculoskeletal: Normal range of motion.        General: No swelling, tenderness, deformity or signs of injury.  Skin:    General: Skin is warm  and dry.  Neurological:     General: No focal deficit present.     Mental Status: He is alert and oriented to person, place, and time. Mental status is at baseline.     Cranial Nerves: No cranial nerve deficit.     Sensory: No sensory deficit.     Motor: No weakness.  Psychiatric:        Mood and Affect: Mood normal.        Behavior: Behavior normal.      ED Treatments / Results  Labs (all labs ordered are listed, but only abnormal results are displayed)  Labs Reviewed  BASIC METABOLIC PANEL - Abnormal; Notable for the following components:      Result Value   Glucose, Bld 105 (*)    Creatinine, Ser 1.76 (*)    Calcium 8.7 (*)    GFR calc non Af Amer 39 (*)    GFR calc Af Amer 45 (*)    All other components within normal limits  CBC - Abnormal; Notable for the following components:   Hemoglobin 12.8 (*)    RDW 16.0 (*)    All other components within normal limits  CBG MONITORING, ED    EKG None  Radiology No results found.  Procedures Procedures (including critical care time)  Medications Ordered in ED Medications  sodium chloride flush (NS) 0.9 % injection 3 mL (has no administration in time range)  lactated ringers bolus 1,000 mL (1,000 mLs Intravenous Refused 12/22/18 1306)     Initial Impression / Assessment and Plan / ED Course  I have reviewed the triage vital signs and the nursing notes.  Pertinent labs & imaging results that were available during my care of the patient were reviewed by me and considered in my medical decision making (see chart for details).        Isaac Reyes is a 68 y.o. male with history of CVA, syncope, and HTN presents to the ED via EMS complaining of near syncopal episode earlier today while walking outside in the heat.  On initial evaluation, patient is feeling at his baseline and does not wish to stay in the hospital.  He is well-appearing.  EKG was reviewed which showed normal sinus rhythm with rate of 69 with normal  axis, normal intervals, and no acute ischemic changes.  A 1 L bolus of LR was ordered, however patient self discontinued his IV prior to its administration and was asking to leave.  Labs obtained during triage significant for mild AKI with creatinine of 1.76 from baseline around 1.4.  Patient was advised to drink plenty of fluids at home.  He was discharged from the ED in stable condition with instructions to follow-up with his primary care physician and to return for any worsening symptoms.    Final Clinical Impressions(s) / ED Diagnoses   Final diagnoses:  Near syncope  AKI (acute kidney injury) [facility])    ED Discharge Orders    None       Candie Chroman, MD 12/22/18 1327    Noemi Chapel, MD 12/23/18 2034

## 2018-12-22 NOTE — ED Triage Notes (Signed)
Patient presents to the ED by EMS with c/o syncopal episode. Reports standing outside speaking with friends when he went down to his knees, he can recall the event and was incontinent of urine, diaphoretic, No trauma noted. Hx of blood thinner for stroke.

## 2019-03-13 ENCOUNTER — Other Ambulatory Visit: Payer: Self-pay

## 2019-03-13 DIAGNOSIS — Z20822 Contact with and (suspected) exposure to covid-19: Secondary | ICD-10-CM

## 2019-03-15 LAB — NOVEL CORONAVIRUS, NAA: SARS-CoV-2, NAA: NOT DETECTED

## 2019-04-14 DIAGNOSIS — I509 Heart failure, unspecified: Secondary | ICD-10-CM | POA: Diagnosis not present

## 2019-04-14 DIAGNOSIS — N184 Chronic kidney disease, stage 4 (severe): Secondary | ICD-10-CM | POA: Diagnosis not present

## 2019-04-14 DIAGNOSIS — Z0000 Encounter for general adult medical examination without abnormal findings: Secondary | ICD-10-CM | POA: Diagnosis not present

## 2019-04-14 DIAGNOSIS — N400 Enlarged prostate without lower urinary tract symptoms: Secondary | ICD-10-CM | POA: Diagnosis not present

## 2019-04-14 DIAGNOSIS — Z79899 Other long term (current) drug therapy: Secondary | ICD-10-CM | POA: Diagnosis not present

## 2019-04-14 DIAGNOSIS — R799 Abnormal finding of blood chemistry, unspecified: Secondary | ICD-10-CM | POA: Diagnosis not present

## 2019-04-21 LAB — LAB REPORT - SCANNED
Albumin, Urine POC: 1.9
Creatinine, POC: 227 mg/dL
EGFR: 60
HM Hepatitis Screen: NEGATIVE
Microalb Creat Ratio: 8
PSA, Total: 8.3

## 2019-06-16 ENCOUNTER — Ambulatory Visit: Payer: Medicare Other | Admitting: Neurology

## 2019-06-16 ENCOUNTER — Telehealth: Payer: Self-pay

## 2019-06-16 NOTE — Telephone Encounter (Signed)
Patient no show for new pt appt. 

## 2019-06-22 ENCOUNTER — Encounter: Payer: Self-pay | Admitting: Neurology

## 2019-07-14 ENCOUNTER — Ambulatory Visit (INDEPENDENT_AMBULATORY_CARE_PROVIDER_SITE_OTHER): Payer: Medicare HMO | Admitting: Neurology

## 2019-07-14 ENCOUNTER — Other Ambulatory Visit: Payer: Self-pay

## 2019-07-14 ENCOUNTER — Encounter: Payer: Self-pay | Admitting: Neurology

## 2019-07-14 ENCOUNTER — Telehealth: Payer: Self-pay | Admitting: Neurology

## 2019-07-14 VITALS — BP 142/81 | HR 72 | Temp 97.4°F | Ht 74.0 in | Wt 233.0 lb

## 2019-07-14 DIAGNOSIS — I6381 Other cerebral infarction due to occlusion or stenosis of small artery: Secondary | ICD-10-CM | POA: Diagnosis not present

## 2019-07-14 DIAGNOSIS — R269 Unspecified abnormalities of gait and mobility: Secondary | ICD-10-CM

## 2019-07-14 MED ORDER — ATORVASTATIN CALCIUM 20 MG PO TABS: 20.0000 mg | ORAL_TABLET | Freq: Every day | ORAL | 11 refills | Status: DC

## 2019-07-14 NOTE — Telephone Encounter (Signed)
Rep with [facility] LVM in regards to the patients atorvastatin 20mg  called to confirm if the change in med strength is intentional

## 2019-07-14 NOTE — Patient Instructions (Signed)
I had a long d/w patient about his remote lacunar strokes, risk for recurrent stroke/TIAs, personally independently reviewed imaging studies and stroke evaluation results and answered questions.Continue aspirin 81 mg daily  for secondary stroke prevention and maintain strict control of hypertension with blood pressure goal below 130/90, diabetes with hemoglobin A1c goal below 6.5% and lipids with LDL cholesterol goal below 70 mg/dL.  Start Lipitor 20 mg daily and gave him a prescription and advised him to get subsequent prescriptions from his primary physician I also advised the patient to eat a healthy diet with plenty of whole grains, cereals, fruits and vegetables, exercise regularly and maintain ideal body weight.  He was advised to use a cane at all times and we discussed fall prevention precautions.  Followup in the future with me only as necessary.  Fall Prevention in the Home, Adult Falls can cause injuries. They can happen to people of all ages. There are many things you can do to make your home safe and to help prevent falls. Ask for help when making these changes, if needed. What actions can I take to prevent falls? General Instructions  Use good lighting in all rooms. Replace any light bulbs that burn out.  Turn on the lights when you go into a dark area. Use night-lights.  Keep items that you use often in easy-to-reach places. Lower the shelves around your home if necessary.  Set up your furniture so you have a clear path. Avoid moving your furniture around.  Do not have throw rugs and other things on the floor that can make you trip.  Avoid walking on wet floors.  If any of your floors are uneven, fix them.  Add color or contrast paint or tape to clearly mark and help you see: ? Any grab bars or handrails. ? First and last steps of stairways. ? Where the edge of each step is.  If you use a stepladder: ? Make sure that it is fully opened. Do not climb a closed  stepladder. ? Make sure that both sides of the stepladder are locked into place. ? Ask someone to hold the stepladder for you while you use it.  If there are any pets around you, be aware of where they are. What can I do in the bathroom?      Keep the floor dry. Clean up any water that spills onto the floor as soon as it happens.  Remove soap buildup in the tub or shower regularly.  Use non-skid mats or decals on the floor of the tub or shower.  Attach bath mats securely with double-sided, non-slip rug tape.  If you need to sit down in the shower, use a plastic, non-slip stool.  Install grab bars by the toilet and in the tub and shower. Do not use towel bars as grab bars. What can I do in the bedroom?  Make sure that you have a light by your bed that is easy to reach.  Do not use any sheets or blankets that are too big for your bed. They should not hang down onto the floor.  Have a firm chair that has side arms. You can use this for support while you get dressed. What can I do in the kitchen?  Clean up any spills right away.  If you need to reach something above you, use a strong step stool that has a grab bar.  Keep electrical cords out of the way.  Do not use floor polish or wax  that makes floors slippery. If you must use wax, use non-skid floor wax. What can I do with my stairs?  Do not leave any items on the stairs.  Make sure that you have a light switch at the top of the stairs and the bottom of the stairs. If you do not have them, ask someone to add them for you.  Make sure that there are handrails on both sides of the stairs, and use them. Fix handrails that are broken or loose. Make sure that handrails are as long as the stairways.  Install non-slip stair treads on all stairs in your home.  Avoid having throw rugs at the top or bottom of the stairs. If you do have throw rugs, attach them to the floor with carpet tape.  Choose a carpet that does not hide the  edge of the steps on the stairway.  Check any carpeting to make sure that it is firmly attached to the stairs. Fix any carpet that is loose or worn. What can I do on the outside of my home?  Use bright outdoor lighting.  Regularly fix the edges of walkways and driveways and fix any cracks.  Remove anything that might make you trip as you walk through a door, such as a raised step or threshold.  Trim any bushes or trees on the path to your home.  Regularly check to see if handrails are loose or broken. Make sure that both sides of any steps have handrails.  Install guardrails along the edges of any raised decks and porches.  Clear walking paths of anything that might make someone trip, such as tools or rocks.  Have any leaves, snow, or ice cleared regularly.  Use sand or salt on walking paths during winter.  Clean up any spills in your garage right away. This includes grease or oil spills. What other actions can I take?  Wear shoes that: ? Have a low heel. Do not wear high heels. ? Have rubber bottoms. ? Are comfortable and fit you well. ? Are closed at the toe. Do not wear open-toe sandals.  Use tools that help you move around (mobility aids) if they are needed. These include: ? Canes. ? Walkers. ? Scooters. ? Crutches.  Review your medicines with your doctor. Some medicines can make you feel dizzy. This can increase your chance of falling. Ask your doctor what other things you can do to help prevent falls. Where to find more information  Centers for Disease Control and Prevention, STEADI: https://garcia.biz/  [employer] on Aging: BrainJudge.co.uk Contact a doctor if:  You are afraid of falling at home.  You feel weak, drowsy, or dizzy at home.  You fall at home. Summary  There are many simple things that you can do to make your home safe and to help prevent falls.  Ways to make your home safe include removing tripping hazards and installing grab  bars in the bathroom.  Ask for help when making these changes in your home. This information is not intended to replace advice given to you by your health care provider. Make sure you discuss any questions you have with your health care provider. Document Revised: 08/28/2018 Document Reviewed: 12/20/2016 Elsevier Patient Education  [place].  Stroke Prevention Some medical conditions and behaviors are associated with a higher chance of having a stroke. You can help prevent a stroke by making nutrition, lifestyle, and other changes, including managing any medical conditions you may have. What nutrition changes  can be made?   Eat healthy foods. You can do this by: ? Choosing foods high in fiber, such as fresh fruits and vegetables and whole grains. ? Eating at least 5 or more servings of fruits and vegetables a day. Try to fill half of your plate at each meal with fruits and vegetables. ? Choosing lean protein foods, such as lean cuts of meat, poultry without skin, fish, tofu, beans, and nuts. ? Eating low-fat dairy products. ? Avoiding foods that are high in salt (sodium). This can help lower blood pressure. ? Avoiding foods that have saturated fat, trans fat, and cholesterol. This can help prevent high cholesterol. ? Avoiding processed and premade foods.  Follow your health care provider's specific guidelines for losing weight, controlling high blood pressure (hypertension), lowering high cholesterol, and managing diabetes. These may include: ? Reducing your daily calorie intake. ? Limiting your daily sodium intake to 1,500 milligrams (mg). ? Using only healthy fats for cooking, such as olive oil, canola oil, or sunflower oil. ? Counting your daily carbohydrate intake. What lifestyle changes can be made?  Maintain a healthy weight. Talk to your health care provider about your ideal weight.  Get at least 30 minutes of moderate physical activity at least 5 days a week. Moderate  activity includes brisk walking, biking, and swimming.  Do not use any products that contain nicotine or tobacco, such as cigarettes and e-cigarettes. If you need help quitting, ask your health care provider. It may also be helpful to avoid exposure to secondhand smoke.  Limit alcohol intake to no more than 1 drink a day for nonpregnant women and 2 drinks a day for men. One drink equals 12 oz of beer, 5 oz of wine, or 1 oz of hard liquor.  Stop any illegal drug use.  Avoid taking birth control pills. Talk to your health care provider about the risks of taking birth control pills if: ? You are over 29 years old. ? You smoke. ? You get migraines. ? You have ever had a blood clot. What other changes can be made?  Manage your cholesterol levels. ? Eating a healthy diet is important for preventing high cholesterol. If cholesterol cannot be managed through diet alone, you may also need to take medicines. ? Take any prescribed medicines to control your cholesterol as told by your health care provider.  Manage your diabetes. ? Eating a healthy diet and exercising regularly are important parts of managing your blood sugar. If your blood sugar cannot be managed through diet and exercise, you may need to take medicines. ? Take any prescribed medicines to control your diabetes as told by your health care provider.  Control your hypertension. ? To reduce your risk of stroke, try to keep your blood pressure below 130/80. ? Eating a healthy diet and exercising regularly are an important part of controlling your blood pressure. If your blood pressure cannot be managed through diet and exercise, you may need to take medicines. ? Take any prescribed medicines to control hypertension as told by your health care provider. ? Ask your health care provider if you should monitor your blood pressure at home. ? Have your blood pressure checked every year, even if your blood pressure is normal. Blood pressure  increases with age and some medical conditions.  Get evaluated for sleep disorders (sleep apnea). Talk to your health care provider about getting a sleep evaluation if you snore a lot or have excessive sleepiness.  Take over-the-counter and prescription medicines  only as told by your health care provider. Aspirin or blood thinners (antiplatelets or anticoagulants) may be recommended to reduce your risk of forming blood clots that can lead to stroke.  Make sure that any other medical conditions you have, such as atrial fibrillation or atherosclerosis, are managed. What are the warning signs of a stroke? The warning signs of a stroke can be easily remembered as BEFAST.  B is for balance. Signs include: ? Dizziness. ? Loss of balance or coordination. ? Sudden trouble walking.  E is for eyes. Signs include: ? A sudden change in vision. ? Trouble seeing.  F is for face. Signs include: ? Sudden weakness or numbness of the face. ? The face or eyelid drooping to one side.  A is for arms. Signs include: ? Sudden weakness or numbness of the arm, usually on one side of the body.  S is for speech. Signs include: ? Trouble speaking (aphasia). ? Trouble understanding.  T is for time. ? These symptoms may represent a serious problem that is an emergency. Do not wait to see if the symptoms will go away. Get medical help right away. Call your local emergency services (911 in the [place] Do not drive yourself to the hospital.  Other signs of stroke may include: ? A sudden, severe headache with no known cause. ? Nausea or vomiting. ? Seizure. Where to find more information For more information, visit:  American Stroke Association: www.strokeassociation.org  National Stroke Association: www.stroke.org Summary  You can prevent a stroke by eating healthy, exercising, not smoking, limiting alcohol intake, and managing any medical conditions you may have.  Do not use any products that contain  nicotine or tobacco, such as cigarettes and e-cigarettes. If you need help quitting, ask your health care provider. It may also be helpful to avoid exposure to secondhand smoke.  Remember BEFAST for warning signs of stroke. Get help right away if you or a loved one has any of these signs. This information is not intended to replace advice given to you by your health care provider. Make sure you discuss any questions you have with your health care provider. Document Revised: 04/19/2017 Document Reviewed: 06/12/2016 Elsevier Patient Education  2020 [employer].

## 2019-07-14 NOTE — Telephone Encounter (Signed)
I called the pharmacist and she stated pt was taking 80mg  of lipitor. I stated pt told our MD that he was not taking any stain medication. The pharmacist stated pt was on but had not refill it since 04/2019. I stated pt was a new pt to [place]. The pharmacist will refill the 20mg  for pt.

## 2019-07-14 NOTE — Progress Notes (Signed)
[facility] [address] [place]. [place] [address] [phone]       OFFICE CONSULT NOTE  [name] Date of Birth:  Oct 10, 1950 Medical Record Number:  [id number]   Referring MD: Dustin Folks Reason for Referral: Stroke HPI: [name] is a 69 year old male seen today for initial office consultation visit for stroke.  History is obtained from the patient, review of electronic medical records and referral notes and I personally reviewed imaging films in PACS.  He has past medical history of hyperlipidemia, lupus, anemia who presented on 07/16/2018 with sudden onset of right-sided weakness and dysarthria.  He stated did he put spent 2 days lying on the floor of one of his rooms watching TV and try to get up he could not because of right-sided weakness.  He presented beyond time window for TPA.  MRI scan of the brain showed a small left basal ganglia lacunar infarct.  CT angiogram of the brain and neck both did not show significant large vessel stenosis or occlusion.  2D echo showed normal ejection fraction.  LDL cholesterol was slightly elevated at 82 mg percent and hemoglobin A1c was 5.6.  Urine drug screen was negative.  Patient had previous history of right basal ganglia infarct in March 2019 as well from small vessel disease.  Patient was discharged on dual antiplatelet therapy and Lipitor but for unclear reason he seems to have stopped it.  He states his blood pressure is doing well and today it is slightly elevated 142/81.  The patient quit smoking he states 2 years ago and has not restarted.  He still has some mild facial asymmetry and at times some trouble finding words but otherwise is doing well.  He has had no recurrent stroke or TIA symptoms.  ROS:   14 system review of systems is positive for gait difficulty, imbalance, speech difficulties, word finding difficulties all other systems negative  PMH:  Past Medical History:  Diagnosis Date  . Acute CVA  (cerebrovascular accident) ([place]) 06/2017   "speech issues since" (07/24/2017)  . AKI (acute kidney injury) ([place]) 07/24/2017  . Anemia   . High cholesterol   . Lupus ([place])    "skin kind"  . NSVT (nonsustained ventricular tachycardia) (HCC)     Social History:  Social History   Socioeconomic History  . Marital status: Divorced    Spouse name: Not on file  . Number of children: 2  . Years of education: Not on file  . Highest education level: Not on file  Occupational History  . Not on file  Tobacco Use  . Smoking status: Former Smoker    Packs/day: 0.12    Years: 45.00    Pack years: 5.40    Types: Cigars, Cigarettes    Quit date: 10/19/2016    Years since quitting: 2.7  . Smokeless tobacco: Never Used  Substance and Sexual Activity  . Alcohol use: Yes    Alcohol/week: 3.0 standard drinks    Types: 3 Standard drinks or equivalent per week  . Drug use: No  . Sexual activity: Never  Other Topics Concern  . Not on file  Social History Narrative  . Not on file   Social Determinants of Health   Financial Resource Strain:   . Difficulty of Paying Living Expenses: Not on file  Food Insecurity:   . Worried About Charity fundraiser in the Last Year: Not on file  . Ran Out of Food in the Last Year: Not  on file  Transportation Needs:   . Lack of Transportation (Medical): Not on file  . Lack of Transportation (Non-Medical): Not on file  Physical Activity:   . Days of Exercise per Week: Not on file  . Minutes of Exercise per Session: Not on file  Stress:   . Feeling of Stress : Not on file  Social Connections:   . Frequency of Communication with Friends and Family: Not on file  . Frequency of Social Gatherings with Friends and Family: Not on file  . Attends Religious Services: Not on file  . Active Member of Clubs or Organizations: Not on file  . Attends Archivist Meetings: Not on file  . Marital Status: Not on file  Intimate Partner Violence:   . Fear of  Current or Ex-Partner: Not on file  . Emotionally Abused: Not on file  . Physically Abused: Not on file  . Sexually Abused: Not on file    Medications:   Current Outpatient Medications on File Prior to Visit  Medication Sig Dispense Refill  . amLODipine (NORVASC) 10 MG tablet Take 1 tablet (10 mg total) by mouth daily. 30 tablet 0  . ASPIRIN LOW DOSE 81 MG EC tablet Take 81 mg by mouth daily.  3  . finasteride (PROSCAR) 5 MG tablet     . losartan (COZAAR) 100 MG tablet     . oxybutynin (DITROPAN-XL) 10 MG 24 hr tablet      No current facility-administered medications on file prior to visit.    Allergies:  No Known Allergies  Physical Exam General: well developed, well nourished elderly male, seated, in no evident distress Head: head normocephalic and atraumatic.   Neck: supple with no carotid or supraclavicular bruits Cardiovascular: regular rate and rhythm, no murmurs Musculoskeletal: no deformity Skin:  no rash/petichiae Vascular:  Normal pulses all extremities  Neurologic Exam Mental Status: Awake and fully alert. Oriented to place and time. Recent and remote memory intact. Attention span, concentration and fund of knowledge appropriate. Mood and affect appropriate.  Cranial Nerves: Fundoscopic exam reveals sharp disc margins. Pupils equal, briskly reactive to light. Extraocular movements full without nystagmus. Visual fields full to confrontation. Hearing intact. Facial sensation intact.  Mild right lower facial weakness., tongue, palate moves normally and symmetrically.  Motor: Normal bulk and tone. Normal strength in all tested extremity muscles.  Mild weakness of right grip and intrinsic hand muscles.  Fine finger movements are diminished on the right.  Orbits left over right upper extremity. Sensory.: intact to touch , pinprick , position and vibratory sensation.  Romberg's weakly positive. Coordination: Rapid alternating movements normal in all extremities. Finger-to-nose  and heel-to-shin performed accurately bilaterally. Gait and Station: Arises from chair without difficulty. Stance is normal. Gait slightly wide-based.  And balance . Able to heel, toe and tandem walk without difficulty.  Reflexes: 1+ and symmetric except both ankle jerks are depressed.. Toes downgoing.   NIHSS  1 Modified Rankin 2  ASSESSMENT: 69 year old male with left basal ganglia lacunar infarct from small vessel disease in February 2020 with vascular risk factors of hyperlipidimia,Hypertension, ex smoking and age     PLAN: I had a long d/w patient about his remote lacunar strokes, risk for recurrent stroke/TIAs, personally independently reviewed imaging studies and stroke evaluation results and answered questions.Continue aspirin 81 mg daily  for secondary stroke prevention and maintain strict control of hypertension with blood pressure goal below 130/90, diabetes with hemoglobin A1c goal below 6.5% and lipids with LDL cholesterol  goal below 70 mg/dL.  Start Lipitor 20 mg daily and gave him a prescription and advised him to get subsequent prescriptions from his primary physician I also advised the patient to eat a healthy diet with plenty of whole grains, cereals, fruits and vegetables, exercise regularly and maintain ideal body weight.  He was advised to use a cane at all times and we discussed fall prevention precautions.  Greater than 50% time during this 50-minute consultation visit was spent on counseling and coordination of care about his lacunar stroke and discussion about stroke prevention and treatment and answering questions. Followup in the future with me only as necessary. Antony Contras, MD  [facility] [address] [place], [place] [address]  Phone [phone] Fax [phone] Note: This document was prepared with digital dictation and possible smart phrase technology. Any transcriptional errors that result from this process are  unintentional.

## 2019-07-17 ENCOUNTER — Ambulatory Visit: Payer: Medicare HMO | Attending: Internal Medicine

## 2019-07-17 DIAGNOSIS — Z23 Encounter for immunization: Secondary | ICD-10-CM

## 2019-08-11 ENCOUNTER — Ambulatory Visit: Payer: Medicare HMO | Attending: Internal Medicine

## 2019-08-11 DIAGNOSIS — Z23 Encounter for immunization: Secondary | ICD-10-CM

## 2019-08-11 NOTE — Progress Notes (Signed)
   Covid-19 Vaccination Clinic  Name:  Isaac Reyes    MRN: [id number] DOB: November 24, 1950  08/11/2019  [name] was observed post Covid-19 immunization for 15 minutes without incident. He was provided with Vaccine Information Sheet and instruction to access the V-Safe system.   [name] was instructed to call 911 with any severe reactions post vaccine: Marland Kitchen Difficulty breathing  . Swelling of face and throat  . A fast heartbeat  . A bad rash all over body  . Dizziness and weakness   Immunizations Administered    Name Date Dose VIS Date Route   Pfizer COVID-19 Vaccine 08/11/2019 12:03 PM 0.3 mL 05/01/2019 Intramuscular   Manufacturer: [place]   Lot: [id number]   [place]: [address]-6

## 2019-12-02 ENCOUNTER — Encounter: Payer: Self-pay | Admitting: Adult Health

## 2019-12-02 ENCOUNTER — Other Ambulatory Visit: Payer: Self-pay

## 2019-12-02 ENCOUNTER — Ambulatory Visit (INDEPENDENT_AMBULATORY_CARE_PROVIDER_SITE_OTHER): Payer: Medicare HMO | Admitting: Adult Health

## 2019-12-02 VITALS — BP 122/70 | HR 66 | Ht 74.0 in | Wt 231.0 lb

## 2019-12-02 DIAGNOSIS — I6381 Other cerebral infarction due to occlusion or stenosis of small artery: Secondary | ICD-10-CM | POA: Diagnosis not present

## 2019-12-02 NOTE — Patient Instructions (Signed)
Continue aspirin 81 mg daily  and atorvastatin 20 mg daily for secondary stroke prevention  Continue to follow up with PCP regarding cholesterol and blood pressure management   Continue to monitor blood pressure at home  Maintain strict control of hypertension with blood pressure goal below 130/90, diabetes with hemoglobin A1c goal below 6.5% and cholesterol with LDL cholesterol (bad cholesterol) goal below 70 mg/dL. I also advised the patient to eat a healthy diet with plenty of whole grains, cereals, fruits and vegetables, exercise regularly and maintain ideal body weight.  Stable from stroke standpoint recommend follow-up as needed      Thank you for coming to see [place] at [facility]. I hope we have been able to provide you high quality care today.  You may receive a patient satisfaction survey over the next few weeks. We would appreciate your feedback and comments so that we may continue to improve ourselves and the health of our patients.

## 2019-12-02 NOTE — Progress Notes (Signed)
[facility] [address] [place]. [place] [address] [phone]       OFFICE FOLLOW UP NOTE  [name] Date of Birth:  21-Aug-1950 Medical Record Number:  [id number]   Referring MD: Dustin Folks Reason for Referral: Stroke  CC: Stroke follow-up   HPI:   Today, 12/02/2019, Isaac Reyes returns for stroke follow-up.  He has been doing well from a stroke standpoint and denies residual deficits or new stroke/TIA symptoms.  Continues to use a cane for long distance ambulation and denies any recent falls.  Continues on aspirin 81 mg daily and atorvastatin 20 mg daily without side effects.  Blood pressure today 122/70.  No concerns at this time.   History provided for reference purposes only Initial visit 07/14/2019 Dr. Leonie Man: Isaac Reyes is a 69 year old male seen today for initial office consultation visit for stroke.  History is obtained from the patient, review of electronic medical records and referral notes and I personally reviewed imaging films in PACS.  He has past medical history of hyperlipidemia, lupus, anemia who presented on 07/16/2018 with sudden onset of right-sided weakness and dysarthria.  He stated did he put spent 2 days lying on the floor of one of his rooms watching TV and try to get up he could not because of right-sided weakness.  He presented beyond time window for TPA.  MRI scan of the brain showed a small left basal ganglia lacunar infarct.  CT angiogram of the brain and neck both did not show significant large vessel stenosis or occlusion.  2D echo showed normal ejection fraction.  LDL cholesterol was slightly elevated at 82 mg percent and hemoglobin A1c was 5.6.  Urine drug screen was negative.  Patient had previous history of right basal ganglia infarct in March 2019 as well from small vessel disease.  Patient was discharged on dual antiplatelet therapy and Lipitor but for unclear reason he seems to have stopped it.  He states his blood  pressure is doing well and today it is slightly elevated 142/81.  The patient quit smoking he states 2 years ago and has not restarted.  He still has some mild facial asymmetry and at times some trouble finding words but otherwise is doing well.  He has had no recurrent stroke or TIA symptoms.  ROS:   14 system review of systems is positive for gait impairment and all other systems negative  PMH:  Past Medical History:  Diagnosis Date  . Acute CVA (cerebrovascular accident) ([place]) 06/2017   "speech issues since" (07/24/2017)  . AKI (acute kidney injury) ([place]) 07/24/2017  . Anemia   . High cholesterol   . Lupus ([place])    "skin kind"  . NSVT (nonsustained ventricular tachycardia) (HCC)     Social History:  Social History   Socioeconomic History  . Marital status: Divorced    Spouse name: Not on file  . Number of children: 2  . Years of education: Not on file  . Highest education level: Not on file  Occupational History  . Not on file  Tobacco Use  . Smoking status: Former Smoker    Packs/day: 0.12    Years: 45.00    Pack years: 5.40    Types: Cigars, Cigarettes    Quit date: 10/19/2016    Years since quitting: 3.1  . Smokeless tobacco: Never Used  Vaping Use  . Vaping Use: Never used  Substance and Sexual Activity  . Alcohol use: Yes    Alcohol/week: 3.0  standard drinks    Types: 3 Standard drinks or equivalent per week  . Drug use: No  . Sexual activity: Never  Other Topics Concern  . Not on file  Social History Narrative  . Not on file   Social Determinants of Health   Financial Resource Strain:   . Difficulty of Paying Living Expenses:   Food Insecurity:   . Worried About Charity fundraiser in the Last Year:   . Arboriculturist in the Last Year:   Transportation Needs:   . Film/video editor (Medical):   Marland Kitchen Lack of Transportation (Non-Medical):   Physical Activity:   . Days of Exercise per Week:   . Minutes of Exercise per Session:   Stress:   . Feeling  of Stress :   Social Connections:   . Frequency of Communication with Friends and Family:   . Frequency of Social Gatherings with Friends and Family:   . Attends Religious Services:   . Active Member of Clubs or Organizations:   . Attends Archivist Meetings:   Marland Kitchen Marital Status:   Intimate Partner Violence:   . Fear of Current or Ex-Partner:   . Emotionally Abused:   Marland Kitchen Physically Abused:   . Sexually Abused:     Medications:   Current Outpatient Medications on File Prior to Visit  Medication Sig Dispense Refill  . amLODipine (NORVASC) 10 MG tablet Take 1 tablet (10 mg total) by mouth daily. 30 tablet 0  . ASPIRIN LOW DOSE 81 MG EC tablet Take 81 mg by mouth daily.  3  . atorvastatin (LIPITOR) 20 MG tablet Take 1 tablet (20 mg total) by mouth daily. 30 tablet 11  . atorvastatin (LIPITOR) 20 MG tablet Take 1 tablet (20 mg total) by mouth daily. 30 tablet 11  . finasteride (PROSCAR) 5 MG tablet     . losartan (COZAAR) 100 MG tablet     . oxybutynin (DITROPAN-XL) 10 MG 24 hr tablet      No current facility-administered medications on file prior to visit.    Allergies:  No Known Allergies   Vitals Today's Vitals   12/02/19 0915  BP: 122/70  Pulse: 66  Weight: 231 lb (104.8 kg)  Height: 6\' 2"  (1.88 m)   Body mass index is 29.66 kg/m.   Physical Exam General: well developed, well nourished pleasant elderly male, seated, in no evident distress Head: head normocephalic and atraumatic.   Neck: supple with no carotid or supraclavicular bruits Cardiovascular: regular rate and rhythm, no murmurs Musculoskeletal: no deformity Skin:  no rash/petichiae Vascular:  Normal pulses all extremities  Neurologic Exam Mental Status: Awake and fully alert.  Mild dysarthria (per patient, baseline) oriented to place and time. Recent and remote memory intact. Attention span, concentration and fund of knowledge appropriate. Mood and affect appropriate.  Cranial Nerves: Pupils  equal, briskly reactive to light. Extraocular movements full without nystagmus. Visual fields full to confrontation. Hearing intact. Facial sensation intact.  Face, tongue, palate moves normally and symmetrically.  Motor: Normal bulk and tone. Normal strength in all tested extremity muscles.   Sensory.: intact to touch , pinprick , position and vibratory sensation.  Coordination: Rapid alternating movements normal in all extremities. Finger-to-nose and heel-to-shin performed accurately bilaterally. Gait and Station: Arises from chair without difficulty. Stance is normal. Gait slightly wide-based with mild imbalance initially with use of cane Reflexes: 1+ and symmetric. Toes downgoing.       ASSESSMENT: 69 year old male with left  basal ganglia lacunar infarct from small vessel disease in February 2020 with vascular risk factors of hyperlipidimia,Hypertension, ex smoking and age     PLAN: Continue aspirin 81 mg daily and atorvastatin 20 mg daily for secondary stroke prevention  Continue to follow with PCP for aggressive stroke risk factor management including HTN and HLD management and prescribing of atorvastatin maintain strict control of hypertension with blood pressure goal below 130/90, diabetes with hemoglobin A1c goal below 6.5% and lipids with LDL cholesterol goal below 70 mg/dL.   Stable from stroke standpoint and may follow-up on an as-needed basis  I spent 20 minutes of face-to-face and non-face-to-face time with patient.  This included previsit chart review, lab review, study review, order entry, electronic health record documentation, patient education regarding prior strokes, importance of managing stroke risk factors and answered all questions to patient satisfaction   Frann Rider, [facility]  [facility] [address] [place], [place] [address]  Phone [phone] Fax [phone] Note: This document was prepared with digital dictation and  possible smart phrase technology. Any transcriptional errors that result from this process are unintentional.

## 2019-12-03 NOTE — Progress Notes (Signed)
I agree with the above plan 

## 2020-07-01 ENCOUNTER — Emergency Department (HOSPITAL_COMMUNITY): Payer: Medicare Other

## 2020-07-01 ENCOUNTER — Other Ambulatory Visit: Payer: Self-pay

## 2020-07-01 ENCOUNTER — Inpatient Hospital Stay (HOSPITAL_COMMUNITY)
Admission: EM | Admit: 2020-07-01 | Discharge: 2020-07-03 | DRG: 312 | Disposition: A | Discharge status: Home / Self Care | Payer: Medicare Other | Attending: Internal Medicine | Admitting: Internal Medicine

## 2020-07-01 DIAGNOSIS — Z79899 Other long term (current) drug therapy: Secondary | ICD-10-CM

## 2020-07-01 DIAGNOSIS — E7800 Pure hypercholesterolemia, unspecified: Secondary | ICD-10-CM | POA: Diagnosis present

## 2020-07-01 DIAGNOSIS — R55 Syncope and collapse: Principal | ICD-10-CM | POA: Diagnosis present

## 2020-07-01 DIAGNOSIS — Z20822 Contact with and (suspected) exposure to covid-19: Secondary | ICD-10-CM | POA: Diagnosis present

## 2020-07-01 DIAGNOSIS — I2510 Atherosclerotic heart disease of native coronary artery without angina pectoris: Secondary | ICD-10-CM | POA: Diagnosis present

## 2020-07-01 DIAGNOSIS — Z7982 Long term (current) use of aspirin: Secondary | ICD-10-CM

## 2020-07-01 DIAGNOSIS — N400 Enlarged prostate without lower urinary tract symptoms: Secondary | ICD-10-CM | POA: Diagnosis present

## 2020-07-01 DIAGNOSIS — Z8249 Family history of ischemic heart disease and other diseases of the circulatory system: Secondary | ICD-10-CM

## 2020-07-01 DIAGNOSIS — I639 Cerebral infarction, unspecified: Secondary | ICD-10-CM | POA: Diagnosis present

## 2020-07-01 DIAGNOSIS — Z87891 Personal history of nicotine dependence: Secondary | ICD-10-CM

## 2020-07-01 DIAGNOSIS — N183 Chronic kidney disease, stage 3 unspecified: Secondary | ICD-10-CM | POA: Diagnosis present

## 2020-07-01 DIAGNOSIS — J9811 Atelectasis: Secondary | ICD-10-CM | POA: Diagnosis present

## 2020-07-01 DIAGNOSIS — I69922 Dysarthria following unspecified cerebrovascular disease: Secondary | ICD-10-CM

## 2020-07-01 DIAGNOSIS — N179 Acute kidney failure, unspecified: Secondary | ICD-10-CM | POA: Diagnosis present

## 2020-07-01 DIAGNOSIS — I517 Cardiomegaly: Secondary | ICD-10-CM | POA: Diagnosis present

## 2020-07-01 DIAGNOSIS — I129 Hypertensive chronic kidney disease with stage 1 through stage 4 chronic kidney disease, or unspecified chronic kidney disease: Secondary | ICD-10-CM | POA: Diagnosis present

## 2020-07-01 DIAGNOSIS — M329 Systemic lupus erythematosus, unspecified: Secondary | ICD-10-CM | POA: Diagnosis present

## 2020-07-01 DIAGNOSIS — Z66 Do not resuscitate: Secondary | ICD-10-CM | POA: Diagnosis present

## 2020-07-01 DIAGNOSIS — M25562 Pain in left knee: Secondary | ICD-10-CM | POA: Diagnosis present

## 2020-07-01 DIAGNOSIS — I493 Ventricular premature depolarization: Secondary | ICD-10-CM | POA: Diagnosis present

## 2020-07-01 LAB — CBC WITH DIFFERENTIAL/PLATELET
Abs Immature Granulocytes: 0.02 10*3/uL (ref 0.00–0.07)
Basophils Absolute: 0 10*3/uL (ref 0.0–0.1)
Basophils Relative: 0 %
Eosinophils Absolute: 0.1 10*3/uL (ref 0.0–0.5)
Eosinophils Relative: 1 %
HCT: 42.8 % (ref 39.0–52.0)
Hemoglobin: 13.5 g/dL (ref 13.0–17.0)
Immature Granulocytes: 0 %
Lymphocytes Relative: 20 %
Lymphs Abs: 1.1 10*3/uL (ref 0.7–4.0)
MCH: 26.5 pg (ref 26.0–34.0)
MCHC: 31.5 g/dL (ref 30.0–36.0)
MCV: 83.9 fL (ref 80.0–100.0)
Monocytes Absolute: 0.9 10*3/uL (ref 0.1–1.0)
Monocytes Relative: 16 %
Neutro Abs: 3.5 10*3/uL (ref 1.7–7.7)
Neutrophils Relative %: 63 %
Platelets: 167 10*3/uL (ref 150–400)
RBC: 5.1 MIL/uL (ref 4.22–5.81)
RDW: 15.9 % — ABNORMAL HIGH (ref 11.5–15.5)
WBC: 5.6 10*3/uL (ref 4.0–10.5)
nRBC: 0 % (ref 0.0–0.2)

## 2020-07-01 LAB — ETHANOL: Alcohol, Ethyl (B): <10 mg/dL (ref <10)

## 2020-07-01 LAB — BASIC METABOLIC PANEL
Anion gap: 10 (ref 5–15)
BUN: 32 mg/dL — ABNORMAL HIGH (ref 8–23)
CO2: 25 mmol/L (ref 22–32)
Calcium: 8.7 mg/dL — ABNORMAL LOW (ref 8.9–10.3)
Chloride: 104 mmol/L (ref 98–111)
Creatinine, Ser: 1.79 mg/dL — ABNORMAL HIGH (ref 0.61–1.24)
GFR, Estimated: 40 mL/min — ABNORMAL LOW (ref >60)
Glucose, Bld: 102 mg/dL — ABNORMAL HIGH (ref 70–99)
Potassium: 3.6 mmol/L (ref 3.5–5.1)
Sodium: 139 mmol/L (ref 135–145)

## 2020-07-01 LAB — TROPONIN I (HIGH SENSITIVITY)
Troponin I (High Sensitivity): 90 ng/L — ABNORMAL HIGH (ref <18)
Troponin I (High Sensitivity): 90 ng/L — ABNORMAL HIGH (ref <18)

## 2020-07-01 LAB — HIV ANTIBODY (ROUTINE TESTING W REFLEX): HIV Screen 4th Generation wRfx: NONREACTIVE

## 2020-07-01 LAB — CBG MONITORING, ED: Glucose-Capillary: 99 mg/dL (ref 70–99)

## 2020-07-01 MED ORDER — DOCUSATE SODIUM 100 MG PO CAPS
100.0000 mg | ORAL_CAPSULE | Freq: Two times a day (BID) | ORAL | Status: DC
  Administered 2020-07-01 – 2020-07-03 (×4): 100 mg via ORAL
  Filled 2020-07-01 (×4): qty 1

## 2020-07-01 MED ORDER — HEPARIN SODIUM (PORCINE) 5000 UNIT/ML IJ SOLN
5000.0000 [IU] | Freq: Three times a day (TID) | INTRAMUSCULAR | Status: DC
  Administered 2020-07-01 – 2020-07-03 (×6): 5000 [IU] via SUBCUTANEOUS
  Filled 2020-07-01 (×6): qty 1

## 2020-07-01 MED ORDER — AMLODIPINE BESYLATE 10 MG PO TABS
10.0000 mg | ORAL_TABLET | Freq: Every day | ORAL | Status: DC
  Administered 2020-07-02 – 2020-07-03 (×2): 10 mg via ORAL
  Filled 2020-07-01 (×2): qty 1

## 2020-07-01 MED ORDER — ATORVASTATIN CALCIUM 10 MG PO TABS
20.0000 mg | ORAL_TABLET | Freq: Every day | ORAL | Status: DC
  Administered 2020-07-02 – 2020-07-03 (×2): 20 mg via ORAL
  Filled 2020-07-01 (×2): qty 2

## 2020-07-01 MED ORDER — FINASTERIDE 5 MG PO TABS
5.0000 mg | ORAL_TABLET | Freq: Every day | ORAL | Status: DC
  Administered 2020-07-02 – 2020-07-03 (×2): 5 mg via ORAL
  Filled 2020-07-01 (×3): qty 1

## 2020-07-01 MED ORDER — ACETAMINOPHEN 650 MG RE SUPP: 650.0000 mg | Freq: Four times a day (QID) | RECTAL | Status: DC | PRN

## 2020-07-01 MED ORDER — ASPIRIN EC 81 MG PO TBEC
81.0000 mg | DELAYED_RELEASE_TABLET | Freq: Every day | ORAL | Status: DC
  Administered 2020-07-02 – 2020-07-03 (×2): 81 mg via ORAL
  Filled 2020-07-01 (×2): qty 1

## 2020-07-01 MED ORDER — ACETAMINOPHEN 325 MG PO TABS
650.0000 mg | ORAL_TABLET | Freq: Four times a day (QID) | ORAL | Status: DC | PRN
  Administered 2020-07-03: 650 mg via ORAL
  Filled 2020-07-01: qty 2

## 2020-07-01 MED ORDER — OXYBUTYNIN CHLORIDE ER 10 MG PO TB24
10.0000 mg | ORAL_TABLET | Freq: Every day | ORAL | Status: DC
  Administered 2020-07-02 (×2): 10 mg via ORAL
  Filled 2020-07-01 (×5): qty 1

## 2020-07-01 NOTE — ED Triage Notes (Signed)
Pt arrived to ED via EMS after a bystander saw pt have a syncopal episode [place] the [place] [facility]. Pt denies any injuries, but does endorse weakness x 1 week. Pt has hx of CVA w/ deficits of instability and R facial droop. Pt also has hx of seizures per EMS, but pt was not postictal and was A&Ox4 w/ GCS of 15.   VS: HR 82, BP 110/90, RR 18, CBG 113, T 96.5, 100% RA  EMS IV 20g L hand

## 2020-07-01 NOTE — ED Notes (Signed)
Patient transported to CT 

## 2020-07-01 NOTE — H&P (Signed)
Date: 07/01/2020               Patient Name:  Isaac Reyes MRN: [id number]  DOB: 28-Apr-1951 Age / Sex: 70 y.o., male   PCP: Sonia Side., FNP         Medical Service: Internal Medicine Teaching Service         Attending Physician: Dr. [name] Raynaldo Opitz, MD    First Contact: Dr. Gaylan Gerold Pager: [phone]  Second Contact: Dr. Harvie Heck Pager: [phone]       After Hours (After 5p/  First Contact Pager: [phone]  weekends / holidays): Second Contact Pager: [phone]   Chief Complaint: syncope  History of Present Illness: Isaac Reyes is a 70 year old male with PMHx of CVA with mild dysarthria, coronary artery disease, alcohol use disorder, lupus, nonsustained ventricular tachycardia presenting for evaluation of witnessed syncope.  Patient was in his usual state of health and talking to his friend when he suddenly passed out. He denies any prodromal syndromes. Denies any head trauma or current headache. He denies any palpitations, lightheadedness/dizziness, new or worsening focal weakness.  He also denies history of seizure. Has had similar episode in the past, and was previously admitted in 2019 for syncope. Work up was negative for orthostatic hypotention  Meds:  No outpatient medications have been marked as taking for the 07/01/20 encounter [facility] Encounter).     Allergies: Allergies as of 07/01/2020  . (No Known Allergies)   Past Medical History:  Diagnosis Date  . Acute CVA (cerebrovascular accident) ([place]) 06/2017   "speech issues since" (07/24/2017)  . AKI (acute kidney injury) ([place]) 07/24/2017  . Anemia   . High cholesterol   . Lupus ([place])    "skin kind"  . NSVT (nonsustained ventricular tachycardia) ([facility])     Family History:  Denies any family history of heart disease, lung disease or cancer.   Social History:  Patient lives by himself. He does not drive.  Patient has prior history of alcohol use but notes discontinuing one year ago.   Denies any tobacco or illicit drug use.  Review of Systems: A complete ROS was negative except as per HPI.   Physical Exam: Blood pressure (!) 133/95, pulse 86, resp. rate 19, SpO2 98 %.  Physical Exam Constitutional:      General: He is not in acute distress.    Appearance: He is not toxic-appearing.  HENT:     Head: Normocephalic.  Eyes:     General:        Right eye: No discharge.        Left eye: No discharge.  Cardiovascular:     Rate and Rhythm: Normal rate and regular rhythm.  Pulmonary:     Effort: Pulmonary effort is normal. No respiratory distress.  Abdominal:     General: Bowel sounds are normal.     Tenderness: There is no abdominal tenderness.  Musculoskeletal:     Cervical back: Normal range of motion.     Right lower leg: No edema.     Left lower leg: No edema.  Skin:    General: Skin is warm.  Neurological:     Mental Status: He is alert.     Comments: Normal EOM, slightly sluggish Unremarkable cranial nerves Slightly weaker in strength of right LE and UE Normal strength of left LE and UV  Psychiatric:        Mood and Affect: Mood normal.  EKG: personally reviewed my interpretation is sinus rythm  CXR: personally reviewed my interpretation is mild left basilar atelectasis  CT head:  IMPRESSION: 1. Generalized cerebral atrophy. 2. Small chronic bilateral basal ganglia lacunar infarcts. 3. No acute intracranial abnormality.  Assessment & Plan by Problem: 70 year old male with PMHx of CVA with mild dysarthria, coronary artery disease, alcohol use disorder, lupus, nonsustained ventricular tachycardia presenting for evaluation of witnessed syncope, likely cardiac etiology.   Active Problems:   Syncope and collapse  Syncope Likely cardiac in nature based on the lack of prodrome or postictal state.  Patient had a similar syncopal episode in 2019, EEG were negative for seizure, CT head negative for CVA, also negative for orthostatic  hypotension.  Echocardiogram 2019 showed EF 0000000, grade 1 diastolic, with no significant valvular abnormalities.  However left LV myocardium is hypoechogenic with increased RV wall thickness suspicious for infiltrative process such as amyloidosis.  Patient was follow-up with cardiology shortly after and was going to have a loop recorder placed, which was canceled.  Echocardiogram 2020 showed normal EF with mildly increased LV wall thickness with no increase in RV wall thickness. Etiology of his syncope likely arrhythmia with possible infiltrative disease such as amyloidosis.  Will repeat echocardiogram and monitor telemetry.  Will repeat EKG in a.m.  Will obtain CMP in a.m. to calculate protein gap, if positive will consider work-up for amyloidosis.  Patient will need follow-up with cardiology after discharge to have a loop recorder placed. -Pending echocardiogram -Monitor telemetry   AKI on CKD Serum creatinine 1.7 admission.  Baseline around 1.4.  Could be prerenal -Obtain UA -Calculate FeNa -Encouraged p.o. intake -Holding losartan  Hypertension -Resume amlodipine -Holding losartan  History of CVA -Aspirin 81 mg -Atorvastatin 20 mg  BPH -Finasteride 5 mg -Oxybutynin 10 mg  CODE STATUS: DNI DNR Fluid: N/A Diet: Heart healthy DVT: Heparin  Dispo: Admit patient to Observation with expected length of stay less than 2 midnights.  Signed: Gaylan Gerold, DO 07/01/2020, 4:58 PM  Pager: [phone] After 5pm on weekdays and 1pm on weekends: On Call pager: [phone]

## 2020-07-01 NOTE — ED Provider Notes (Signed)
[place] EMERGENCY DEPARTMENT Provider Note   CSN: [id number] Arrival date & time: 07/01/20  1222     History Chief Complaint  Patient presents with  . Loss of Consciousness    Isaac MASCIOLI is a 70 y.o. male with PMH of CVA with mild residual deficits, HTN, HLD, CAD, lupus, alcohol use disorder, and recurrent syncopal episodes who presents the ED via EMS for witnessed syncope.  History is obtained by EMS reports that a bystander noticed him to pass out on the sidewalk near a homeless shelter.  Upon their arrival, patient was already alert and oriented x3.  Patient denied any injuries from his fall.  No postictal confusion.  GCS 15.  Reported residual deficits of right-sided facial droop and disequilibrium which they confirmed with his close contact.  On my examination, patient states that he was "standing next to a barrel" talking a friend when he suddenly woke up on the ground, found by his friend and another bystander.  He does not recall his syncopal episode.  Patient denies any presyncopal prodrome. He states that he had been feeling generalized weakness and malaise for the past week or two. Patient has chronic dysarthria and chronic gait disturbance subsequent to mild right-sided weakness. Patient denies any recent illness or infection, fevers or chills, chest pain, shortness of breath, tongue biting or incontinence, headache or dizziness, new numbness or weakness, or other focal deficits. He states that he has progressively worsening vision over the course of years, but no acute changes to his visual acuity.    While EMS report states that he was near the homeless shelter, patient reports that he lives in a "tiny house" independently. He denies any recent alcohol or drug use. Patient is yawning and dozing off throughout my examination. He states that it is due to staying up late watching television.    HPI     Past Medical History:  Diagnosis Date  . Acute  CVA (cerebrovascular accident) ([place]) 06/2017   "speech issues since" (07/24/2017)  . AKI (acute kidney injury) ([place]) 07/24/2017  . Anemia   . High cholesterol   . Lupus ([place])    "skin kind"  . NSVT (nonsustained ventricular tachycardia) [facility])     Patient Active Problem List   Diagnosis Date Noted  . Syncope 11/07/2017  . Benign essential HTN 07/26/2017  . Acute kidney failure ([place]) 07/24/2017  . CVA (cerebral vascular accident) ([place]) 07/19/2017  . Stroke (cerebrum) ([place]) 07/18/2017  . NSVT (nonsustained ventricular tachycardia) ([place]) 07/18/2017  . Elevated blood pressure reading 07/18/2017  . Normocytic normochromic anemia 07/18/2017  . Malnutrition of moderate degree 07/18/2017  . Normocytic anemia   . Frequent falls   . Renal insufficiency   . Hyperlipidemia   . Heavy smoker   . Alcohol abuse   . Abnormal EKG 03/19/2013  . Pre-operative cardiovascular exam, new EKG abnormalities c/w ischemia 03/19/2013  . LVH (left ventricular hypertrophy) 03/19/2013  . Murmur, cardiac 03/19/2013    Past Surgical History:  Procedure Laterality Date  . KNEE ARTHROSCOPY    . TONSILLECTOMY         Family History  Problem Relation Age of Onset  . Heart attack Father     Social History   Tobacco Use  . Smoking status: Former Smoker    Packs/day: 0.12    Years: 45.00    Pack years: 5.40    Types: Cigars, Cigarettes    Quit date: 10/19/2016    Years since quitting:  3.7  . Smokeless tobacco: Never Used  Vaping Use  . Vaping Use: Never used  Substance Use Topics  . Alcohol use: Yes    Alcohol/week: 3.0 standard drinks    Types: 3 Standard drinks or equivalent per week  . Drug use: No    Home Medications Prior to Admission medications   Medication Sig Start Date End Date Taking? Authorizing Provider  amLODipine (NORVASC) 10 MG tablet Take 1 tablet (10 mg total) by mouth daily. 07/27/17   [name], MD  ASPIRIN LOW DOSE 81 MG EC tablet Take 81 mg by mouth daily. 09/26/17    [provider]  atorvastatin (LIPITOR) 20 MG tablet Take 1 tablet (20 mg total) by mouth daily. 07/14/19 07/13/20  Garvin Fila, MD  atorvastatin (LIPITOR) 20 MG tablet Take 1 tablet (20 mg total) by mouth daily. 07/14/19 07/13/20  Garvin Fila, MD  finasteride (PROSCAR) 5 MG tablet  05/13/19   [provider]  losartan (COZAAR) 100 MG tablet  05/09/19   [provider]  oxybutynin (DITROPAN-XL) 10 MG 24 hr tablet  06/17/19   [provider]    Allergies    Patient has no known allergies.  Review of Systems   Review of Systems  All other systems reviewed and are negative.   Physical Exam Updated Vital Signs BP (!) 150/85   Pulse 78   Resp (!) 21   SpO2 98%   Physical Exam Vitals and nursing note reviewed. Exam conducted with a chaperone present.  Constitutional:      Appearance: Normal appearance.  HENT:     Head: Normocephalic and atraumatic.  Eyes:     General: No scleral icterus.    Extraocular Movements: Extraocular movements intact.     Conjunctiva/sclera: Conjunctivae normal.     Pupils: Pupils are equal, round, and reactive to light.     Comments: No nystagmus.  Cardiovascular:     Rate and Rhythm: Normal rate and regular rhythm.     Pulses: Normal pulses.  Pulmonary:     Effort: Pulmonary effort is normal. No respiratory distress.     Breath sounds: No wheezing.  Abdominal:     General: Abdomen is flat. There is no distension.     Palpations: Abdomen is soft.     Tenderness: There is no abdominal tenderness.  Musculoskeletal:        General: Normal range of motion.     Cervical back: Normal range of motion.  Skin:    General: Skin is dry.     Capillary Refill: Capillary refill takes less than 2 seconds.  Neurological:     Mental Status: He is alert and oriented to person, place, and time.     GCS: GCS eye subscore is 4. GCS verbal subscore is 5. GCS motor subscore is 6.  Psychiatric:        Mood and Affect: Mood  normal.        Behavior: Behavior normal.        Thought Content: Thought content normal.     ED Results / Procedures / Treatments   Labs (all labs ordered are listed, but only abnormal results are displayed) Labs Reviewed  BASIC METABOLIC PANEL - Abnormal; Notable for the following components:      Result Value   Glucose, Bld 102 (*)    BUN 32 (*)    Creatinine, Ser 1.79 (*)    Calcium 8.7 (*)    GFR, Estimated 40 (*)  All other components within normal limits  CBC WITH DIFFERENTIAL/PLATELET - Abnormal; Notable for the following components:   RDW 15.9 (*)    All other components within normal limits  TROPONIN I (HIGH SENSITIVITY) - Abnormal; Notable for the following components:   Troponin I (High Sensitivity) 90 (*)    All other components within normal limits  ETHANOL  RAPID URINE DRUG SCREEN, HOSP PERFORMED  URINALYSIS, ROUTINE W REFLEX MICROSCOPIC  CBG MONITORING, ED  TROPONIN I (HIGH SENSITIVITY)    EKG EKG Interpretation  Date/Time:  Friday July 01 2020 12:35:25 EST Ventricular Rate:  72 PR Interval:    QRS Duration: 108 QT Interval:  384 QTC Calculation: 421 R Axis:   -27 Text Interpretation: Sinus rhythm Abnormal R-wave progression, early transition LVH with secondary repolarization abnormality No significant change since last tracing Confirmed by Davonna Belling [phone]) on 07/01/2020 2:41:00 PM   Radiology DG Chest Portable 1 View  Result Date: 07/01/2020 CLINICAL DATA:  Syncopal episode EXAM: PORTABLE CHEST 1 VIEW COMPARISON:  11/09/2017 FINDINGS: Cardiac shadow is enlarged but stable. Tortuous thoracic aorta with calcifications is seen. Mild left basilar atelectasis is noted related to elevated left hemidiaphragm. No focal effusion or sizable infiltrate is seen. No bony abnormality is noted. IMPRESSION: Elevation of left hemidiaphragm with mild left basilar atelectasis. Electronically Signed   By: Inez Catalina M.D.   On: 07/01/2020 12:51     Procedures Procedures   Medications Ordered in ED Medications - No data to display  ED Course  I have reviewed the triage vital signs and the nursing notes.  Pertinent labs & imaging results that were available during my care of the patient were reviewed by me and considered in my medical decision making (see chart for details).    MDM Rules/Calculators/A&P                          Isaac Reyes was evaluated in Emergency Department on 07/01/2020 for the symptoms described in the history of present illness. He was evaluated in the context of the global COVID-19 pandemic, which necessitated consideration that the patient might be at risk for infection with the SARS-CoV-2 virus that causes COVID-19. Institutional protocols and algorithms that pertain to the evaluation of patients at risk for COVID-19 are in a state of rapid change based on information released by regulatory bodies including the CDC and federal and state organizations. These policies and algorithms were followed during the patient's care in the ED.  I personally reviewed patient's medical chart and all notes from triage and staff during today's encounter. I have also ordered and reviewed all labs and imaging that I felt to be medically necessary in the evaluation of this patient's complaints and with consideration of their physical exam. If needed, translation services were available and utilized.   Patient with witnessed syncopal episode that was unprovoked and without presyncopal prodrome.  He does have a history of arrhythmia in the past.  Troponin elevated at 90, no recent with which to compare.  Concern for cardiac syncope.  Patient with dysarthria and mild right-sided weakness that he states is chronic and subsequent to his CVA, no new deficits.  He is resting comfortably on my exam and in no acute distress.  However, given his unprovoked syncopal episode and risk factors, feel as though he would benefit from medical  admission for observation.  Dr. Alvino Chapel personally evaluated patient and agrees with assessment and plan.   Spoke  with Internal Medicine team who will see and admit patient.  Final Clinical Impression(s) / ED Diagnoses Final diagnoses:  Syncope, unspecified syncope type    Rx / DC Orders ED Discharge Orders    None       Reita Chard 07/01/20 1537    Davonna Belling, MD 07/02/20 2350

## 2020-07-01 NOTE — ED Notes (Signed)
Dinner Tray Ordered @ 1710.

## 2020-07-02 ENCOUNTER — Observation Stay (HOSPITAL_COMMUNITY): Payer: Medicare Other

## 2020-07-02 ENCOUNTER — Other Ambulatory Visit: Payer: Self-pay | Admitting: Medical

## 2020-07-02 ENCOUNTER — Encounter (HOSPITAL_COMMUNITY): Payer: Self-pay | Admitting: Internal Medicine

## 2020-07-02 DIAGNOSIS — Z8249 Family history of ischemic heart disease and other diseases of the circulatory system: Secondary | ICD-10-CM | POA: Diagnosis not present

## 2020-07-02 DIAGNOSIS — M25562 Pain in left knee: Secondary | ICD-10-CM | POA: Diagnosis present

## 2020-07-02 DIAGNOSIS — N400 Enlarged prostate without lower urinary tract symptoms: Secondary | ICD-10-CM | POA: Diagnosis present

## 2020-07-02 DIAGNOSIS — I129 Hypertensive chronic kidney disease with stage 1 through stage 4 chronic kidney disease, or unspecified chronic kidney disease: Secondary | ICD-10-CM | POA: Diagnosis present

## 2020-07-02 DIAGNOSIS — N179 Acute kidney failure, unspecified: Secondary | ICD-10-CM | POA: Diagnosis present

## 2020-07-02 DIAGNOSIS — J9811 Atelectasis: Secondary | ICD-10-CM | POA: Diagnosis present

## 2020-07-02 DIAGNOSIS — I493 Ventricular premature depolarization: Secondary | ICD-10-CM | POA: Diagnosis present

## 2020-07-02 DIAGNOSIS — I2510 Atherosclerotic heart disease of native coronary artery without angina pectoris: Secondary | ICD-10-CM | POA: Diagnosis present

## 2020-07-02 DIAGNOSIS — Z79899 Other long term (current) drug therapy: Secondary | ICD-10-CM | POA: Diagnosis not present

## 2020-07-02 DIAGNOSIS — M329 Systemic lupus erythematosus, unspecified: Secondary | ICD-10-CM | POA: Diagnosis present

## 2020-07-02 DIAGNOSIS — R55 Syncope and collapse: Secondary | ICD-10-CM | POA: Diagnosis present

## 2020-07-02 DIAGNOSIS — I69922 Dysarthria following unspecified cerebrovascular disease: Secondary | ICD-10-CM | POA: Diagnosis not present

## 2020-07-02 DIAGNOSIS — Z7982 Long term (current) use of aspirin: Secondary | ICD-10-CM | POA: Diagnosis not present

## 2020-07-02 DIAGNOSIS — Z66 Do not resuscitate: Secondary | ICD-10-CM | POA: Diagnosis present

## 2020-07-02 DIAGNOSIS — S99912A Unspecified injury of left ankle, initial encounter: Secondary | ICD-10-CM | POA: Diagnosis present

## 2020-07-02 DIAGNOSIS — N183 Chronic kidney disease, stage 3 unspecified: Secondary | ICD-10-CM | POA: Diagnosis present

## 2020-07-02 DIAGNOSIS — X58XXXA Exposure to other specified factors, initial encounter: Secondary | ICD-10-CM | POA: Diagnosis not present

## 2020-07-02 DIAGNOSIS — Z87891 Personal history of nicotine dependence: Secondary | ICD-10-CM | POA: Diagnosis not present

## 2020-07-02 DIAGNOSIS — Z20822 Contact with and (suspected) exposure to covid-19: Secondary | ICD-10-CM | POA: Diagnosis present

## 2020-07-02 DIAGNOSIS — E7800 Pure hypercholesterolemia, unspecified: Secondary | ICD-10-CM | POA: Diagnosis present

## 2020-07-02 DIAGNOSIS — I10 Essential (primary) hypertension: Secondary | ICD-10-CM

## 2020-07-02 DIAGNOSIS — S92152A Displaced avulsion fracture (chip fracture) of left talus, initial encounter for closed fracture: Secondary | ICD-10-CM | POA: Diagnosis not present

## 2020-07-02 LAB — COMPREHENSIVE METABOLIC PANEL
ALT: 27 U/L (ref 0–44)
AST: 22 U/L (ref 15–41)
Albumin: 2.6 g/dL — ABNORMAL LOW (ref 3.5–5.0)
Alkaline Phosphatase: 68 U/L (ref 38–126)
Anion gap: 6 (ref 5–15)
BUN: 28 mg/dL — ABNORMAL HIGH (ref 8–23)
CO2: 25 mmol/L (ref 22–32)
Calcium: 8.3 mg/dL — ABNORMAL LOW (ref 8.9–10.3)
Chloride: 108 mmol/L (ref 98–111)
Creatinine, Ser: 1.55 mg/dL — ABNORMAL HIGH (ref 0.61–1.24)
GFR, Estimated: 48 mL/min — ABNORMAL LOW (ref >60)
Glucose, Bld: 106 mg/dL — ABNORMAL HIGH (ref 70–99)
Potassium: 3.4 mmol/L — ABNORMAL LOW (ref 3.5–5.1)
Sodium: 139 mmol/L (ref 135–145)
Total Bilirubin: 0.9 mg/dL (ref 0.3–1.2)
Total Protein: 5.6 g/dL — ABNORMAL LOW (ref 6.5–8.1)

## 2020-07-02 LAB — CBC
HCT: 40 % (ref 39.0–52.0)
Hemoglobin: 12.2 g/dL — ABNORMAL LOW (ref 13.0–17.0)
MCH: 25.7 pg — ABNORMAL LOW (ref 26.0–34.0)
MCHC: 30.5 g/dL (ref 30.0–36.0)
MCV: 84.4 fL (ref 80.0–100.0)
Platelets: 151 10*3/uL (ref 150–400)
RBC: 4.74 MIL/uL (ref 4.22–5.81)
RDW: 15.7 % — ABNORMAL HIGH (ref 11.5–15.5)
WBC: 5.9 10*3/uL (ref 4.0–10.5)
nRBC: 0 % (ref 0.0–0.2)

## 2020-07-02 LAB — CREATININE, URINE, RANDOM: Creatinine, Urine: 113.44 mg/dL

## 2020-07-02 LAB — URINALYSIS, ROUTINE W REFLEX MICROSCOPIC
Bilirubin Urine: NEGATIVE
Glucose, UA: NEGATIVE mg/dL
Hgb urine dipstick: NEGATIVE
Ketones, ur: NEGATIVE mg/dL
Leukocytes,Ua: NEGATIVE
Nitrite: NEGATIVE
Protein, ur: NEGATIVE mg/dL
Specific Gravity, Urine: 1.013 (ref 1.005–1.030)
pH: 6 (ref 5.0–8.0)

## 2020-07-02 LAB — ECHOCARDIOGRAM COMPLETE
Area-P 1/2: 2.63 cm2
Height: 74 in
S' Lateral: 1.9 cm
Weight: 3559.11 oz

## 2020-07-02 LAB — RAPID URINE DRUG SCREEN, HOSP PERFORMED
Amphetamines: NOT DETECTED
Barbiturates: NOT DETECTED
Benzodiazepines: NOT DETECTED
Cocaine: POSITIVE — AB
Opiates: NOT DETECTED
Tetrahydrocannabinol: NOT DETECTED

## 2020-07-02 LAB — SARS CORONAVIRUS 2 (TAT 6-24 HRS): SARS Coronavirus 2: NEGATIVE

## 2020-07-02 LAB — SODIUM, URINE, RANDOM: Sodium, Ur: 29 mmol/L

## 2020-07-02 LAB — GLUCOSE, CAPILLARY: Glucose-Capillary: 86 mg/dL (ref 70–99)

## 2020-07-02 MED ORDER — POTASSIUM CHLORIDE CRYS ER 20 MEQ PO TBCR
40.0000 meq | EXTENDED_RELEASE_TABLET | Freq: Once | ORAL | Status: AC
  Administered 2020-07-02: 40 meq via ORAL
  Filled 2020-07-02: qty 2

## 2020-07-02 MED ORDER — DICLOFENAC SODIUM 1 % EX GEL
2.0000 g | Freq: Four times a day (QID) | CUTANEOUS | Status: DC | PRN
  Administered 2020-07-03: 10:00:00 2 g via TOPICAL
  Filled 2020-07-02: qty 100

## 2020-07-02 NOTE — Progress Notes (Signed)
Subjective:   Hospital day:1  Overnight event:None  Mr Isaac Reyes was evaluated at bedside this morning. He is resting comfortably in bed. He denies any overnight events at this time and notes that he slept well.  Has not getting out of bed.  Discussed recommendation to follow up with cardiology for cardiac monitoring.  UDS positive for cocaine but patient denies any recent use.   Patient complains of left knee pain, rated 8/10, with PT. When evaluated at bedside, patient states that this knee pain happened after the fall.  He did not have knee pain prior to the syncope.  Reports a surgery to his left knee many years ago.  Objective:  Vital signs in last 24 hours: Vitals:   07/01/20 1715 07/01/20 2017 07/01/20 2220 07/02/20 0358  BP: 136/83 127/81 (!) 143/81 (!) 151/92  Pulse: 86 86 75 71  Resp: 19 (!) '21 20 18  '$ Temp:  98.1 F (36.7 C) 98.3 F (36.8 C) 98.8 F (37.1 C)  TempSrc:  Oral Oral Oral  SpO2: 98% 99% 98% 98%  Weight:   99.1 kg 100.9 kg  Height:   '6\' 2"'$  (1.88 m)    CBC Latest Ref Rng & Units 07/02/2020 07/01/2020 12/22/2018  WBC 4.0 - 10.5 K/uL 5.9 5.6 5.0  Hemoglobin 13.0 - 17.0 g/dL 12.2(L) 13.5 12.8(L)  Hematocrit 39.0 - 52.0 % 40.0 42.8 42.2  Platelets 150 - 400 K/uL 151 167 174   CMP Latest Ref Rng & Units 07/02/2020 07/01/2020 12/22/2018  Glucose 70 - 99 mg/dL 106(H) 102(H) 105(H)  BUN 8 - 23 mg/dL 28(H) 32(H) 18  Creatinine 0.61 - 1.24 mg/dL 1.55(H) 1.79(H) 1.76(H)  Sodium 135 - 145 mmol/L 139 139 140  Potassium 3.5 - 5.1 mmol/L 3.4(L) 3.6 3.6  Chloride 98 - 111 mmol/L 108 104 106  CO2 22 - 32 mmol/L '25 25 24  '$ Calcium 8.9 - 10.3 mg/dL 8.3(L) 8.7(L) 8.7(L)  Total Protein 6.5 - 8.1 g/dL 5.6(L) - -  Total Bilirubin 0.3 - 1.2 mg/dL 0.9 - -  Alkaline Phos 38 - 126 U/L 68 - -  AST 15 - 41 U/L 22 - -  ALT 0 - 44 U/L 27 - -     Physical Exam  Physical Exam Constitutional:      General: He is not in acute distress. HENT:     Head: Normocephalic.  Eyes:      General:        Right eye: No discharge.        Left eye: No discharge.  Cardiovascular:     Rate and Rhythm: Normal rate and regular rhythm.  Pulmonary:     Effort: Pulmonary effort is normal. No respiratory distress.     Comments: Mild crackle of right lung base Abdominal:     General: Bowel sounds are normal.     Tenderness: There is no abdominal tenderness.  Musculoskeletal:     Right lower leg: No edema.     Left lower leg: No edema.     Comments: Left knee: No pain to palpation along the joint line.  No effusion.  Tenderness with knee flexion, guarding.  Skin:    General: Skin is warm.  Neurological:     Mental Status: He is alert.  Psychiatric:        Mood and Affect: Mood normal.     Assessment/Plan: DORRANCE DEITCH is a 70 year old male with PMHx of CVA with mild dysarthria, coronary artery disease, alcohol  use disorder, lupus, nonsustained ventricular tachycardia presenting for evaluation of witnessed syncope, likely cardiac etiology.   Active Problems:   Syncope and collapse  Syncope Etiology of his syncope likely arrhythmia with possible infiltrative disease such as amyloidosis.  Overnight telemetry did not show any significant arrhythmia.  Repeat EKG unremarkable.  Pending echocardiogram.  Cardiology was consulted for recommendation.  Patient is likely will need a loop recorder placed. -Appreciate cardiology recommendations -Pending echocardiogram -Monitor telemetry -UDS showed cocaine, which can cause arrhythmia.  However patient denied cocaine use.   Left knee pain Patient complains of left knee pain that started after the incident.  Denies prior knee pain.  Limited range of motion due to pain.  Given his new syncopal episode and fall, will obtain x-ray of left knee to rule out fracture. -Pending left knee x-ray   AKI on CKD Baseline around 1.4.  Serum creatinine 1.5 today.  feNa confirmed prerenal.  UA unremarkable -Encouraged p.o.  intake -Holding losartan   Hypertension -Resume amlodipine -Holding losartan   History of CVA -Aspirin 81 mg -Atorvastatin 20 mg   BPH -Finasteride 5 mg -Oxybutynin 10 mg   CODE STATUS: DNI DNR Fluid: N/A Diet: Heart healthy DVT: Heparin  Prior to Admission Living Arrangement: Home Anticipated Discharge Location: To be determined Barriers to Discharge: Work-up for syncope Dispo: Anticipated discharge in approximately 1-2 day(s).   Gaylan Gerold, DO 07/02/2020, 6:03 AM Pager: [phone] After 5pm on weekdays and 1pm on weekends: On Call pager [phone]

## 2020-07-02 NOTE — Consult Note (Incomplete)
Cardiology Consultation:   Patient ID: Isaac Reyes MRN: [id number]; DOB: 17-Aug-1950  Admit date: 07/01/2020 Date of Consult: 07/02/2020  PCP:  [facility]  Cardiologist:  Pixie Casino, MD; has not been seen since 2019 Advanced Practice Provider:  No care team member to display Electrophysiologist:  None    Patient Profile:   Isaac Reyes is a 70 y.o. male with a PMH of HTN, HLD, syncope in 2019, cutaneous lupus, CVA, and CKD stage 3, who is being seen today for the evaluation of syncope at the request of Dr. [name].  History of Present Illness:   Mr. Rathgeb was in his usual state of health until 07/01/20 when he had sudden onset syncope. He reports he was waiting for the bus, talking to a friend at the time, when he had sudden loss of consciousness without any prodromal symptoms. He had no complaints of chest pain, palpitations, SOB, dizziness, light headedness, focal weakness, nausea, vomiting, or diaphoresis. He regained consciousness within seconds without residual symptoms. Thankfully he had no head strike as people assisted him to the ground and activated EMS.   This is not his first syncopal event. He had a similar episode in 2019 with a reassuring work-up including carotid dopplers and EEG, with plans for a possible loop recorder outpatient. He was seen outpatient following this admission and scheduled for ILR with Dr. Sallyanne Kuster, however cancelled the procedure prior to placement and has not followed up with cardiology since that time. His last echocardiogram prior to this admission was 06/2018 which showed EF 60-65%, G1DD, no RWMA, mild LAE, and degenerative MV with no MR/MS.   At the time of this evaluation he feels like his usual self. He reports he did not eat breakfast on the morning of his syncopal episode and wonders if this contributed. He reports some chronic DOE which he attributes to deconditioning. No complaints  of chest pain, palpitations, orthopnea, PND, or significant LE edema.   Hospital course: BP intermittently elevated, intermittently tachycardic, otherwise VSS. Labs notable for K 3.6>3.4, Cr 1.79>1.55, Hgb 12.2, PLT 155, HsTrop 90>90, Utox +cocaine. EKG with sinus rhythm, rate 72 bpm, LVH, non-specific T wave abnormalities, no significant change from previous. Echo this admission with poor acoustic windows, EF 550-60%, ill defined LV - unable to assess wall motion, G1DD, and no significant valvular abnormalities. He was admitted to medicine. Cardiology asked to evaluate.   Past Medical History:  Diagnosis Date  . Acute CVA (cerebrovascular accident) ([place]) 06/2017   "speech issues since" (07/24/2017)  . AKI (acute kidney injury) ([place]) 07/24/2017  . Anemia   . High cholesterol   . Lupus ([place])    "skin kind"  . NSVT (nonsustained ventricular tachycardia) ([facility])     Past Surgical History:  Procedure Laterality Date  . KNEE ARTHROSCOPY    . TONSILLECTOMY       Home Medications:  Prior to Admission medications   Medication Sig Start Date End Date Taking? Authorizing Provider  amLODipine (NORVASC) 10 MG tablet Take 1 tablet (10 mg total) by mouth daily. 07/27/17   [name], MD  ASPIRIN LOW DOSE 81 MG EC tablet Take 81 mg by mouth daily. 09/26/17   [provider]  atorvastatin (LIPITOR) 20 MG tablet Take 1 tablet (20 mg total) by mouth daily. 07/14/19 07/13/20  Garvin Fila, MD  atorvastatin (LIPITOR) 20 MG tablet Take 1 tablet (20 mg total) by mouth daily. 07/14/19 07/13/20  Garvin Fila, MD  finasteride (PROSCAR) 5 MG tablet  05/13/19   [provider]  losartan (COZAAR) 100 MG tablet  05/09/19   [provider]  oxybutynin (DITROPAN-XL) 10 MG 24 hr tablet  06/17/19   [provider]    Inpatient Medications: Scheduled Meds: . amLODipine  10 mg Oral Daily  . aspirin EC  81 mg Oral Daily  . atorvastatin  20 mg Oral Daily  . docusate sodium  100 mg  Oral BID  . finasteride  5 mg Oral Daily  . heparin  5,000 Units Subcutaneous Q8H  . oxybutynin  10 mg Oral QHS   Continuous Infusions:  PRN Meds: acetaminophen **OR** acetaminophen, diclofenac Sodium  Allergies:   No Known Allergies  Social History:   Social History   Socioeconomic History  . Marital status: Divorced    Spouse name: Not on file  . Number of children: 2  . Years of education: Not on file  . Highest education level: Not on file  Occupational History  . Not on file  Tobacco Use  . Smoking status: Former Smoker    Packs/day: 0.12    Years: 45.00    Pack years: 5.40    Types: Cigars, Cigarettes    Quit date: 10/19/2016    Years since quitting: 3.7  . Smokeless tobacco: Never Used  Vaping Use  . Vaping Use: Never used  Substance and Sexual Activity  . Alcohol use: Yes    Alcohol/week: 3.0 standard drinks    Types: 3 Standard drinks or equivalent per week  . Drug use: No  . Sexual activity: Never  Other Topics Concern  . Not on file  Social History Narrative  . Not on file   Social Determinants of Health   Financial Resource Strain: Not on file  Food Insecurity: Not on file  Transportation Needs: Not on file  Physical Activity: Not on file  Stress: Not on file  Social Connections: Not on file  Intimate Partner Violence: Not on file    Family History:    Family History  Problem Relation Age of Onset  . Heart attack Father      ROS:  Please see the history of present illness.   All other ROS reviewed and negative.     Physical Exam/Data:   Vitals:   07/01/20 2220 07/02/20 0358 07/02/20 0920 07/02/20 1207  BP: (!) 143/81 (!) 151/92 119/76 135/74  Pulse: 75 71  72  Resp: '20 18  16  '$ Temp: 98.3 F (36.8 C) 98.8 F (37.1 C)  98.3 F (36.8 C)  TempSrc: Oral Oral  Oral  SpO2: 98% 98%  97%  Weight: 99.1 kg 100.9 kg    Height: '6\' 2"'$  (1.88 m)       Intake/Output Summary (Last 24 hours) at 07/02/2020 1530 Last data filed at 07/02/2020  [id number] Gross per 24 hour  Intake 480 ml  Output 350 ml  Net 130 ml   Last 3 Weights 07/02/2020 07/01/2020 12/02/2019  Weight (lbs) 222 lb 7.1 oz 218 lb 7.6 oz 231 lb  Weight (kg) 100.9 kg 99.1 kg 104.781 kg     Body mass index is 28.56 kg/m.  Physical Exam per MD below  EKG:  The EKG was personally reviewed and demonstrates:   sinus rhythm, rate 72 bpm, LVH, non-specific T wave abnormalities, no significant change from previous.  Telemetry:  Telemetry was personally reviewed and demonstrates:  Sinus rhythm with occasional PVCs/PACs  Relevant CV Studies:  Echocardiogram 07/02/20:  1. Very limited study due to poor acoustic windows. Based on limited  views, the left ventricular ejection fraction, by estimation, is 55 to  60%. The left ventricle has normal function. Left ventricular endocardial  border not optimally defined to  evaluate regional wall motion. There is mild concentric left ventricular  hypertrophy. Left ventricular diastolic parameters are consistent with  Grade I diastolic dysfunction (impaired relaxation).  2. Right ventricular systolic function was not well visualized. The right  ventricular size appears normal based on limited views.  3. The mitral valve is normal in structure. Trivial mitral valve  regurgitation.  4. The aortic valve was not well visualized. Aortic valve regurgitation  is not visualized. Mild aortic valve sclerosis is present, with no  evidence of aortic valve stenosis.  5. The inferior vena cava is normal in size with <50% respiratory  variability, suggesting right atrial pressure of 8 mmHg.   Comparison(s): Current study limited due to poor acoustic windows, but no  significant changes noted compared to prior TTE in 06/2018.   Laboratory Data:  High Sensitivity Troponin:   Recent Labs  Lab 07/01/20 1402 07/01/20 1714  TROPONINIHS 90* 90*     Chemistry Recent Labs  Lab 07/01/20 1402 07/02/20 0243  NA 139 139  K 3.6 3.4*  CL 104  108  CO2 25 25  GLUCOSE 102* 106*  BUN 32* 28*  CREATININE 1.79* 1.55*  CALCIUM 8.7* 8.3*  GFRNONAA 40* 48*  ANIONGAP 10 6    Recent Labs  Lab 07/02/20 0243  PROT 5.6*  ALBUMIN 2.6*  AST 22  ALT 27  ALKPHOS 68  BILITOT 0.9   Hematology Recent Labs  Lab 07/01/20 1402 07/02/20 0243  WBC 5.6 5.9  RBC 5.10 4.74  HGB 13.5 12.2*  HCT 42.8 40.0  MCV 83.9 84.4  MCH 26.5 25.7*  MCHC 31.5 30.5  RDW 15.9* 15.7*  PLT 167 151   BNPNo results for input(s): BNP, PROBNP in the last 168 hours.  DDimer No results for input(s): DDIMER in the last 168 hours.   Radiology/Studies:  CT Head Wo Contrast  Result Date: 07/01/2020 CLINICAL DATA:  Syncope. EXAM: CT HEAD WITHOUT CONTRAST TECHNIQUE: Contiguous axial images were obtained from the base of the skull through the vertex without intravenous contrast. COMPARISON:  July 16, 2018 FINDINGS: Brain: There is mild cerebral atrophy with widening of the extra-axial spaces and ventricular dilatation. There are areas of decreased attenuation within the white matter tracts of the supratentorial brain, consistent with microvascular disease changes. Small chronic bilateral basal ganglia lacunar infarcts are seen. Vascular: No hyperdense vessel or unexpected calcification. Skull: Normal. Negative for fracture or focal lesion. Sinuses/Orbits: Very mild posterior right maxillary sinus mucosal thickening is seen. Other: None. IMPRESSION: 1. Generalized cerebral atrophy. 2. Small chronic bilateral basal ganglia lacunar infarcts. 3. No acute intracranial abnormality. Electronically Signed   By: Virgina Norfolk M.D.   On: 07/01/2020 15:42   DG Chest Portable 1 View  Result Date: 07/01/2020 CLINICAL DATA:  Syncopal episode EXAM: PORTABLE CHEST 1 VIEW COMPARISON:  11/09/2017 FINDINGS: Cardiac shadow is enlarged but stable. Tortuous thoracic aorta with calcifications is seen. Mild left basilar atelectasis is noted related to elevated left hemidiaphragm. No  focal effusion or sizable infiltrate is seen. No bony abnormality is noted. IMPRESSION: Elevation of left hemidiaphragm with mild left basilar atelectasis. Electronically Signed   By: Inez Catalina M.D.   On: 07/01/2020 12:51   DG Knee Complete 4 Views Left  Result Date: 07/02/2020 CLINICAL DATA:  Pain EXAM: LEFT KNEE - COMPLETE 4+ VIEW COMPARISON:  None. FINDINGS: Frontal, lateral, and bilateral oblique views were obtained. There is no fracture or dislocation. No joint effusion. There is moderately severe joint space narrowing laterally and in the patellofemoral joint. There is spurring laterally. No erosive changes. There are several calcifications medial to the left knee joint which may represent small phleboliths. IMPRESSION: No fracture, dislocation, or joint effusion. 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 Electronically Signed   By: Lowella Grip III M.D.   On: 07/02/2020 13:35   ECHOCARDIOGRAM COMPLETE  Result Date: 07/02/2020    ECHOCARDIOGRAM REPORT   Patient Name:   HILDRED WICKE Belair Date of Exam: 07/02/2020 Medical Rec #:  [id number]         Height:       74.0 in Accession #:    [id number]        Weight:       222.4 lb Date of Birth:  01/16/1951         BSA:          2.274 m Patient Age:    57 years          BP:           151/92 mmHg Patient Gender: M                 HR:           74 bpm. Exam Location:  Inpatient Procedure: 2D Echo, Color Doppler and Cardiac Doppler Indications:     R55 Syncope  History:         Patient has prior history of Echocardiogram examinations, most                  recent 07/18/2018. Risk Factors:Hypertension, Dyslipidemia and                  Current Smoker.  Sonographer:     Raquel Sarna Senior RDCS Referring Phys:  [id number] Davonna Belling Diagnosing Phys: Gwyndolyn Kaufman MD  Sonographer Comments: Technically difficult due to lung interference. IMPRESSIONS  1. Very limited study due to poor acoustic windows. Based on limited views, the left  ventricular ejection fraction, by estimation, is 55 to 60%. The left ventricle has normal function. Left ventricular endocardial border not optimally defined to evaluate regional wall motion. There is mild concentric left ventricular hypertrophy. Left ventricular diastolic parameters are consistent with Grade I diastolic dysfunction (impaired relaxation).  2. Right ventricular systolic function was not well visualized. The right ventricular size appears normal based on limited views.  3. The mitral valve is normal in structure. Trivial mitral valve regurgitation.  4. The aortic valve was not well visualized. Aortic valve regurgitation is not visualized. Mild aortic valve sclerosis is present, with no evidence of aortic valve stenosis.  5. The inferior vena cava is normal in size with <50% respiratory variability, suggesting right atrial pressure of 8 mmHg. Comparison(s): Current study limited due to poor acoustic windows, but no significant changes noted compared to prior TTE in 06/2018. FINDINGS  Left Ventricle: Very limited study due to poor acoustic windows. Based on limited views, the. Left ventricular ejection fraction, by estimation, is 55 to 60%. The left ventricle has normal function. Left ventricular endocardial border not optimally defined to evaluate regional wall motion. The left ventricular internal cavity size was normal in size. There is mild concentric left ventricular hypertrophy. Left ventricular diastolic parameters are consistent  with Grade I diastolic dysfunction (impaired relaxation). Right Ventricle: The right ventricular size is normal. Right vetricular wall thickness was not well visualized. Right ventricular systolic function was not well visualized. Left Atrium: Left atrial size was normal in size. Right Atrium: Right atrial size was normal in size. Pericardium: There is no evidence of pericardial effusion. Mitral Valve: The mitral valve is normal in structure. There is mild thickening of  the mitral valve leaflet(s). Mild mitral annular calcification. Trivial mitral valve regurgitation. Tricuspid Valve: The tricuspid valve is not well visualized. Tricuspid valve regurgitation is trivial. Aortic Valve: The aortic valve was not well visualized. Aortic valve regurgitation is not visualized. Mild aortic valve sclerosis is present, with no evidence of aortic valve stenosis. Pulmonic Valve: The pulmonic valve was not assessed due to poor acoustic windows. Aorta: The aortic root is normal in size and structure. Venous: The inferior vena cava is normal in size with less than 50% respiratory variability, suggesting right atrial pressure of 8 mmHg. IAS/Shunts: The atrial septum is grossly normal.  LEFT VENTRICLE PLAX 2D LVIDd:         3.10 cm  Diastology LVIDs:         1.90 cm  LV e' medial:   6.09 cm/s LV PW:         1.10 cm  LV E/e' medial: 8.4 LV IVS:        1.20 cm LVOT diam:     2.10 cm LV SV:         60 LV SV Index:   26 LVOT Area:     3.46 cm  RIGHT VENTRICLE RV S prime:     10.20 cm/s TAPSE (M-mode): 1.7 cm LEFT ATRIUM             Index       RIGHT ATRIUM           Index LA diam:        3.30 cm 1.45 cm/m  RA Area:     13.80 cm LA Vol (A2C):   50.9 ml 22.38 ml/m RA Volume:   27.90 ml  12.27 ml/m LA Vol (A4C):   64.9 ml 28.54 ml/m LA Biplane Vol: 57.6 ml 25.33 ml/m  AORTIC VALVE LVOT Vmax:   75.70 cm/s LVOT Vmean:  53.600 cm/s LVOT VTI:    0.173 m  AORTA Ao Root diam: 3.30 cm MITRAL VALVE MV Area (PHT): 2.63 cm    SHUNTS MV Decel Time: 288 msec    Systemic VTI:  0.17 m MV E velocity: 51.00 cm/s  Systemic Diam: 2.10 cm MV A velocity: 87.80 cm/s MV E/A ratio:  0.58 Gwyndolyn Kaufman MD Electronically signed by Gwyndolyn Kaufman MD Signature Date/Time: 07/02/2020/12:40:56 PM    Final (Updated)      Assessment and Plan:   1. Syncope: patient presented with sudden onset syncopal episode without significant prodromal symptoms. This is his 2nd or 3rd episode and consistent with prior syncope (last  episode in 2019). Previously recommended for a loop recorder. EKG non-ischemic this admission. Telemetry without significant arrhythmia. Echo reassuring with EF 55-60%, G1DD, and no significant valvular abnormalities. Utox was positive for cocaine, though he denies recent use.  - Recommend evaluation for loop recorder  - Encourage abstinence from cocaine   2. HTN: BP intermittently elevated this admission. Home losartan on hold in the setting of mild AKI.  - Continue amlodipine  3. HLD: no recent lipids on file - Continue atorvastatin  4. History of CVA: occurred  in 2019 - Continue aspirin and statin  5. CKD stage 3: Cr 1.79 on admission, down to 1.55 which may be baseline, though last Cr was 1.4 06/2018.  - Continue to monitor  Risk Assessment/Risk Scores:     For questions or updates, please contact [facility] Please consult [email or web address] for contact info under   Signed, Abigail Butts, PA-C  07/02/2020 3:30 PM   History and all data above reviewed.  Patient examined.  I agree with the findings as above.  The patient presented with syncope.   Well.  Cooperative..  Begin above going to come back to the about 20 minutes.  He said he had frank syncope.  He did not have much in the way of prodrome.  He thinks he went down.  Clear that he had any trauma though he has a little swelling in his left leg.  Noticed before.  He did really describe palpitations.  He does not describe orthostasis.  He has not had chest pressure, neck or arm discomfort.  He had previous syncope monitor placed but he did not have this.  He does not use anything for fever with follow-up with somewhat difficult.    The patient exam reveals GENERAL:  Well appearing HEENT:  Pupils equal round and reactive, fundi not visualized, oral mucosa unremarkable NECK:  No jugular venous distention, waveform within normal limits, carotid upstroke brisk and symmetric, no bruits, no thyromegaly LYMPHATICS:  No cervical,  inguinal adenopathy LUNGS:  Clear to auscultation bilaterally BACK:  No CVA tenderness CHEST:  Unremarkable HEART:  PMI not displaced or sustained,S1 and S2 within normal limits, no S3, no S4, no clicks, no rubs, no murmurs ABD:  Flat, positive bowel sounds normal in frequency in pitch, no bruits, no rebound, no guarding, no midline pulsatile mass, no hepatomegaly, no splenomegaly EXT:  2 plus pulses throughout, no edema, no cyanosis no clubbing SKIN:  No rashes no nodules NEURO:  Cranial nerves II through XII grossly intact, motor grossly intact throughout PSYCH:  Cognitively intact, oriented to person place and time   All available labs, radiology testing, previous records reviewed. Agree with documented assessment and plan. Syncope:  The etiology is not clear.  Work up as above is negative.  I don't strongly suspect a bradyarrhythmia.  However, I think that he does need long term monitoring.  However, exploring his social situation I think it would be difficult if not impossible to have him comply with a wearable.  We are going to set him up for an implanted loop.    This is done as an out patient so we can arrange this appt.      Jeneen Rinks [facility]  8:42 AM  07/03/2020

## 2020-07-02 NOTE — Progress Notes (Signed)
Echocardiogram 2D Echocardiogram has been performed.  Isaac Reyes 07/02/2020, 8:45 AM

## 2020-07-02 NOTE — Evaluation (Signed)
Occupational Therapy Evaluation Patient Details Name: Isaac Reyes MRN: [id number] DOB: [birth year] Today's Date: 07/02/2020    History of Present Illness 70 yo male with onset of syncope and fall x 2 in last week was referred to PT for assessment of mobility. male with onset of syncope and fall x 2 in last week was referred to PT for assessment of mobility.  Pt has L knee pain and effusion, but not recalling how it became that way.  Note atelectasis, AKI, but did not have prodromal symptoms. PMHx:  CVA, dysarthria, CAD, EtOH use, lupus, NSVT,   Clinical Impression   Pt PTA: :Pt living in tiny home and independent with ADL and mobility. Pt currently only deficit is L knee weakness/pain. Imaging (-) for knee. Pt minA overall for mobility in bed and taking a few steps to recliner with RW. Pt reliant on RW. Pt minA overall for ADL tasks. Pt's BP was not orthostatic. BP lying: 123/81; BP sitting: 153/64; BP standing: 121/101 (arm moving). Pt would benefit from continued OT skilled services for ADL, mobility and safety in [place] setting.    Follow Up Recommendations  Home health OT    Equipment Recommendations  3 in 1 bedside commode    Recommendations for Other Services       Precautions / Restrictions Precautions Precautions: Fall;Other (comment) Precaution Comments: dysarthria Restrictions Weight Bearing Restrictions: No      Mobility Bed Mobility Overal bed mobility: Needs Assistance Bed Mobility: Supine to Sit     Supine to sit: Supervision     General bed mobility comments: use of rail    Transfers Overall transfer level: Needs assistance Equipment used: Rolling walker (2 wheeled) Transfers: Sit to/from [facility] Sit to Stand: Min assist Stand pivot transfers: Min assist       General transfer comment: RW    Balance Overall balance assessment: Needs assistance Sitting-balance support: Feet supported;No upper extremity supported Sitting balance-Leahy Scale: Good     Standing balance support: Bilateral upper extremity supported Standing  balance-Leahy Scale: Poor Standing balance comment: reliant on RW                           ADL either performed or assessed with clinical judgement   ADL Overall ADL's : Needs assistance/impaired Eating/Feeding: Modified independent   Grooming: Set up;Sitting   Upper Body Bathing: Set up;Sitting   Lower Body Bathing: Moderate assistance;Sitting/lateral leans;Sit to/from stand   Upper Body Dressing : Set up;Sitting   Lower Body Dressing: Moderate assistance;Sitting/lateral leans;Sit to/from stand   Toilet Transfer: Minimal assistance;Stand-pivot;BSC;RW Armed forces technical officer Details (indicate cue type and reason): taking steps to recliner with pain in L knee Toileting- Clothing Manipulation and Hygiene: Minimal assistance;Sitting/lateral lean;Sit to/from stand;Cueing for safety       Functional mobility during ADLs: Minimal assistance;Rolling walker General ADL Comments: Pt limited by pain in L knee, decreased ability to care for self and decreased activity tolerance.     Vision Baseline Vision/History: No visual deficits Patient Visual Report: No change from baseline Vision Assessment?: No apparent visual deficits     Perception     Praxis      Pertinent Vitals/Pain Pain Assessment: 0-10 Pain Score: 7  Pain Location: b/l ankles Pain Descriptors / Indicators: Discomfort;Sore     Hand Dominance Right   Extremity/Trunk Assessment Upper Extremity Assessment Upper Extremity Assessment: Overall WFL for tasks assessed   Lower Extremity Assessment Lower Extremity Assessment: Generalized weakness;LLE deficits/detail LLE Deficits / Details: pain and swelling noted in L knee   Cervical / Trunk Assessment Cervical / Trunk  Assessment: Normal   Communication Communication Communication: Expressive difficulties;Other (comment) (dysarthria at baseline)   Cognition Arousal/Alertness: Awake/alert Behavior During Therapy: WFL for tasks assessed/performed Overall  Cognitive Status: Within Functional Limits for tasks assessed                                     General Comments  BP lying: 123/81; BP sitting: 153/64; BP standing: 121/101 (arm moving)    Exercises     Shoulder Instructions      Home Living Family/patient expects to be discharged to:: Private residence Living Arrangements: Alone   Type of Home: Other(Comment) Home Access: Stairs to enter [employer] of Steps: 2 Entrance Stairs-Rails: Right Home Layout: One level     Bathroom Shower/Tub: Occupational psychologist: Standard     Home Equipment: Cane - single point   Additional Comments: Does not use cane      Prior Functioning/Environment Level of Independence: Independent        Comments: HAs to use public transportation to appts and for laundry        OT Problem List: Decreased strength;Decreased activity tolerance;Impaired balance (sitting and/or standing);Decreased safety awareness;Pain;Decreased knowledge of use of DME or AE;Increased edema      OT Treatment/Interventions: Self-care/ADL training;Therapeutic exercise;Energy conservation;DME and/or AE instruction;Therapeutic activities;Patient/family education;Balance training    OT Goals(Current goals can be found in the care plan section) Acute Rehab OT Goals Patient Stated Goal: to go home in less pain OT Goal Formulation: With patient Time For Goal Achievement: 07/13/20 Potential to Achieve Goals: Good ADL Goals Pt Will Perform Lower Body Dressing: with set-up;sitting/lateral leans;sit to/from stand;with adaptive equipment Pt Will Transfer to Toilet: with set-up;ambulating;regular height toilet Pt Will Perform Toileting - Clothing Manipulation and hygiene: with set-up;sitting/lateral leans;sit to/from stand Additional ADL Goal #1: Pt will perform OOB ADL x10 mins with modified independence with AE As needed.  OT Frequency: Min 2X/week   Barriers to D/C:             Co-evaluation              AM-PAC OT "6 Clicks" Daily Activity     Outcome Measure Help from another person eating meals?: None Help from another person taking care of personal grooming?: None Help from another person toileting, which includes using toliet, bedpan, or urinal?: A Little Help from another person bathing (including washing, rinsing, drying)?: A Little Help from another person to put on and taking off regular upper body clothing?: None Help from another person to put on and taking off regular lower body clothing?: A Little 6 Click Score: 21   End of Session Equipment Utilized During Treatment: Gait belt;Rolling walker Nurse Communication: Mobility status  Activity Tolerance: Patient limited by pain Patient left: in chair;with call bell/phone within reach;with chair alarm set  OT Visit Diagnosis: Unsteadiness on feet (R26.81);Muscle weakness (generalized) (M62.81)                Time: [id number] OT Time Calculation (min): 39 min Charges:  OT General Charges $OT Visit: 1 Visit OT Evaluation $OT Eval Moderate Complexity: 1 Mod OT Treatments $Self Care/Home Management : 8-22 mins $Therapeutic Activity: 8-22 mins Jefferey Pica, OTR/L Acute Rehabilitation Services Pager: [phone] Office: [phone]   See Beharry C 07/02/2020, 3:23 PM

## 2020-07-02 NOTE — Evaluation (Signed)
Physical Therapy Evaluation Patient Details Name: Isaac Reyes MRN: [id number] DOB: 1950/09/04 Today's Date: 07/02/2020   History of Present Illness  70 yo male with onset of syncope and fall x 2 in last week was referred to PT for assessment of mobility.  Pt has L knee pain and effusion, but not recalling how it became that way.  Note atelectasis, AKI, but did not have prodromal symptoms. PMHx:  CVA, dysarthria, CAD, EtOH use, lupus, NSVT,  Clinical Impression  Pt was seen for mobility on RW with pain on L knee creating a great limit of tolerance.  His knee has been potentially injured in mult falls, but does not recall what happened due to syncopal episode.  Follow along with pt to get further medical eval of knee with potentially a brace to support it for comfort and increased standing function.  Pt is in agreement with these plans, but if knee has WB limits after further review, may need to go to a higher care environment.      Follow Up Recommendations Supervision for mobility/OOB;Supervision/Assistance - 24 hour;Home health PT    Equipment Recommendations  Rolling walker with 5" wheels    Recommendations for Other Services OT consult     Precautions / Restrictions Precautions Precautions: Fall;Other (comment) Precaution Comments: dysarthria Restrictions Weight Bearing Restrictions: No      Mobility  Bed Mobility Overal bed mobility: Needs Assistance Bed Mobility: Supine to Sit     Supine to sit: Supervision     General bed mobility comments: up in chair when PT arrived    Transfers Overall transfer level: Needs assistance Equipment used: Rolling walker (2 wheeled) Transfers: Sit to/from [facility] Sit to Stand: Min assist Stand pivot transfers: Min assist       General transfer comment: RW  Ambulation/Gait Ambulation/Gait assistance: Min guard Gait Distance (Feet): 45 Feet Assistive device: Rolling walker (2 wheeled);1 person hand held  assist Gait Pattern/deviations: Step-through pattern;Decreased stride length;Decreased weight shift to left Gait velocity: reduce Gait velocity interpretation: <1.8 ft/sec, indicate of risk for recurrent falls General Gait Details: pt requires RW for management of L knee pain, reduced stance time with RW to unload  Stairs            Wheelchair Mobility    Modified Rankin (Stroke Patients Only)       Balance Overall balance assessment: Needs assistance Sitting-balance support: Feet supported Sitting balance-Leahy Scale: Good     Standing balance support: Bilateral upper extremity supported;During functional activity Standing balance-Leahy Scale: Poor Standing balance comment: reliant on RW                             Pertinent Vitals/Pain Pain Assessment: 0-10 Pain Score: 8  Pain Location: L knee Pain Descriptors / Indicators: Guarding;Tender Pain Intervention(s): Limited activity within patient's tolerance;Monitored during session;Repositioned;Other (comment) (note edema and erythema, repored to nursing)    [place] expects to be discharged to:: Private residence Living Arrangements: Alone   Type of Home: Other(Comment) Home Access: Stairs to enter Entrance Stairs-Rails: Right Entrance Stairs-Number of Steps: 2 Home Layout: One level Home Equipment: [employer] - single point Additional Comments: walks with no AD normally    Prior Function Level of Independence: Independent         Comments: using public transport     Hand Dominance   Dominant Hand: Right    Extremity/Trunk Assessment   Upper Extremity Assessment Upper Extremity  Assessment: Defer to OT evaluation    Lower Extremity Assessment Lower Extremity Assessment: LLE deficits/detail LLE Deficits / Details: pain and swelling noted in L knee    Cervical / Trunk Assessment Cervical / Trunk Assessment: Normal  Communication   Communication: Expressive  difficulties;Other (comment)  Cognition Arousal/Alertness: Awake/alert Behavior During Therapy: WFL for tasks assessed/performed Overall Cognitive Status: Within Functional Limits for tasks assessed                                        General Comments General comments (skin integrity, edema, etc.): antalgic L knee gait pattern    Exercises     Assessment/Plan    PT Assessment Patient needs continued PT services  PT Problem List Decreased strength;Decreased range of motion;Decreased activity tolerance;Decreased balance;Decreased mobility;Decreased coordination;Decreased knowledge of use of DME;Decreased safety awareness;Pain       PT Treatment Interventions DME instruction;Gait training;Stair training;Functional mobility training;Therapeutic activities;Therapeutic exercise;Balance training;Neuromuscular re-education;Patient/family education    PT Goals (Current goals can be found in the Care Plan section)  Acute Rehab PT Goals Patient Stated Goal: to go home in less pain PT Goal Formulation: With patient Time For Goal Achievement: 07/16/20 Potential to Achieve Goals: Good    Frequency Min 3X/week   Barriers to discharge Decreased caregiver support home alone with L knee pain    Co-evaluation               AM-PAC PT "6 Clicks" Mobility  Outcome Measure Help needed turning from your back to your side while in a flat bed without using bedrails?: A Little Help needed moving from lying on your back to sitting on the side of a flat bed without using bedrails?: A Lot Help needed moving to and from a bed to a chair (including a wheelchair)?: A Little Help needed standing up from a chair using your arms (e.g., wheelchair or bedside chair)?: A Little Help needed to walk in hospital room?: A Little Help needed climbing 3-5 steps with a railing? : A Lot 6 Click Score: 16    End of Session Equipment Utilized During Treatment: Gait belt Activity Tolerance:  Patient limited by fatigue;Patient limited by pain Patient left: in chair;with call bell/phone within reach;with chair alarm set Nurse Communication: Mobility status PT Visit Diagnosis: Unsteadiness on feet (R26.81);Repeated falls (R29.6);Pain Pain - Right/Left: Left Pain - part of body: Knee    Time: [id number] PT Time Calculation (min) (ACUTE ONLY): 21 min   Charges:   PT Evaluation $PT Eval Moderate Complexity: 1 Mod          Ramond Dial 07/02/2020, 4:52 PM Mee Hives, PT MS Acute Rehab Dept. Number: [place] and [place]

## 2020-07-03 ENCOUNTER — Other Ambulatory Visit: Payer: Self-pay

## 2020-07-03 ENCOUNTER — Emergency Department (HOSPITAL_COMMUNITY)
Admission: EM | Admit: 2020-07-03 | Discharge: 2020-07-04 | Disposition: A | Discharge status: Home / Self Care | Payer: Medicare Other | Attending: Emergency Medicine | Admitting: Emergency Medicine

## 2020-07-03 DIAGNOSIS — Z87891 Personal history of nicotine dependence: Secondary | ICD-10-CM | POA: Insufficient documentation

## 2020-07-03 DIAGNOSIS — I10 Essential (primary) hypertension: Secondary | ICD-10-CM | POA: Insufficient documentation

## 2020-07-03 DIAGNOSIS — Z7982 Long term (current) use of aspirin: Secondary | ICD-10-CM | POA: Insufficient documentation

## 2020-07-03 DIAGNOSIS — S82892A Other fracture of left lower leg, initial encounter for closed fracture: Secondary | ICD-10-CM

## 2020-07-03 DIAGNOSIS — Z79899 Other long term (current) drug therapy: Secondary | ICD-10-CM | POA: Insufficient documentation

## 2020-07-03 DIAGNOSIS — S92152A Displaced avulsion fracture (chip fracture) of left talus, initial encounter for closed fracture: Secondary | ICD-10-CM | POA: Insufficient documentation

## 2020-07-03 DIAGNOSIS — R609 Edema, unspecified: Secondary | ICD-10-CM

## 2020-07-03 DIAGNOSIS — R55 Syncope and collapse: Secondary | ICD-10-CM | POA: Insufficient documentation

## 2020-07-03 DIAGNOSIS — R42 Dizziness and giddiness: Secondary | ICD-10-CM

## 2020-07-03 DIAGNOSIS — X58XXXA Exposure to other specified factors, initial encounter: Secondary | ICD-10-CM | POA: Insufficient documentation

## 2020-07-03 LAB — GLUCOSE, CAPILLARY: Glucose-Capillary: 87 mg/dL (ref 70–99)

## 2020-07-03 LAB — CBC
HCT: 39 % (ref 39.0–52.0)
HCT: 45.7 % (ref 39.0–52.0)
Hemoglobin: 12 g/dL — ABNORMAL LOW (ref 13.0–17.0)
Hemoglobin: 13.5 g/dL (ref 13.0–17.0)
MCH: 26 pg (ref 26.0–34.0)
MCH: 25.7 pg — ABNORMAL LOW (ref 26.0–34.0)
MCHC: 30.8 g/dL (ref 30.0–36.0)
MCHC: 29.5 g/dL — ABNORMAL LOW (ref 30.0–36.0)
MCV: 84.4 fL (ref 80.0–100.0)
MCV: 86.9 fL (ref 80.0–100.0)
Platelets: 147 10*3/uL — ABNORMAL LOW (ref 150–400)
Platelets: 124 10*3/uL — ABNORMAL LOW (ref 150–400)
RBC: 4.62 MIL/uL (ref 4.22–5.81)
RBC: 5.26 MIL/uL (ref 4.22–5.81)
RDW: 15.7 % — ABNORMAL HIGH (ref 11.5–15.5)
RDW: 15.9 % — ABNORMAL HIGH (ref 11.5–15.5)
WBC: 3.7 10*3/uL — ABNORMAL LOW (ref 4.0–10.5)
WBC: 5.6 10*3/uL (ref 4.0–10.5)
nRBC: 0 % (ref 0.0–0.2)
nRBC: 0 % (ref 0.0–0.2)

## 2020-07-03 LAB — BASIC METABOLIC PANEL
Anion gap: 7 (ref 5–15)
Anion gap: 9 (ref 5–15)
BUN: 16 mg/dL (ref 8–23)
BUN: 24 mg/dL — ABNORMAL HIGH (ref 8–23)
CO2: 25 mmol/L (ref 22–32)
CO2: 25 mmol/L (ref 22–32)
Calcium: 8.2 mg/dL — ABNORMAL LOW (ref 8.9–10.3)
Calcium: 8.7 mg/dL — ABNORMAL LOW (ref 8.9–10.3)
Chloride: 106 mmol/L (ref 98–111)
Chloride: 103 mmol/L (ref 98–111)
Creatinine, Ser: 1.39 mg/dL — ABNORMAL HIGH (ref 0.61–1.24)
Creatinine, Ser: 1.44 mg/dL — ABNORMAL HIGH (ref 0.61–1.24)
GFR, Estimated: 55 mL/min — ABNORMAL LOW (ref >60)
GFR, Estimated: 52 mL/min — ABNORMAL LOW (ref >60)
Glucose, Bld: 153 mg/dL — ABNORMAL HIGH (ref 70–99)
Glucose, Bld: 120 mg/dL — ABNORMAL HIGH (ref 70–99)
Potassium: 3.7 mmol/L (ref 3.5–5.1)
Potassium: 3.8 mmol/L (ref 3.5–5.1)
Sodium: 138 mmol/L (ref 135–145)
Sodium: 137 mmol/L (ref 135–145)

## 2020-07-03 LAB — CBG MONITORING, ED: Glucose-Capillary: 103 mg/dL — ABNORMAL HIGH (ref 70–99)

## 2020-07-03 MED ORDER — SODIUM CHLORIDE 0.9 % IV BOLUS
500.0000 mL | Freq: Once | INTRAVENOUS | Status: AC
  Administered 2020-07-03: 500 mL via INTRAVENOUS

## 2020-07-03 MED ORDER — DICLOFENAC SODIUM 1 % EX GEL: 2.0000 g | Freq: Four times a day (QID) | CUTANEOUS | 0 refills | Status: DC | PRN

## 2020-07-03 NOTE — ED Provider Notes (Signed)
[facility] EMERGENCY DEPARTMENT Provider Note  CSN: [id number] Arrival date & time: 07/03/20 1916  Chief Complaint(s) Dizziness  HPI Isaac Reyes is a 70 y.o. male    Dizziness Quality:  Lightheadedness Severity:  Moderate Onset quality:  Gradual Duration: several weeks. Timing:  Intermittent Progression:  Unchanged Chronicity:  Recurrent Context comment:  Standing for long period of time, usually about an hour Relieved by: sitting. Worsened by:  Nothing Associated symptoms: syncope   Associated symptoms: no blood in stool, no chest pain, no headaches, no hearing loss, no nausea, no palpitations, no shortness of breath, no vision changes and no vomiting    Patient was just discharged from the hospital earlier today after admission for syncope while standing and had reassuring work up with ECHO showing EF 0000000 with G1 diastolic dysfunction. Cardiac markers were flat. UDS was positive for cocaine.  Patient also reports several days of left leg swelling. Reports he was getting lovenox shots while admitted.   Past Medical History Past Medical History:  Diagnosis Date  . Acute CVA (cerebrovascular accident) ([place]) 06/2017   "speech issues since" (07/24/2017)  . AKI (acute kidney injury) ([place]) 07/24/2017  . Anemia   . High cholesterol   . Lupus ([place])    "skin kind"  . NSVT (nonsustained ventricular tachycardia) [facility])    Patient Active Problem List   Diagnosis Date Noted  . Syncope and collapse 07/01/2020  . Syncope 11/07/2017  . Benign essential HTN 07/26/2017  . Acute kidney failure ([place]) 07/24/2017  . CVA (cerebral vascular accident) ([place]) 07/19/2017  . 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 NSVT (nonsustained ventricular tachycardia) ([place]) 07/18/2017  . Elevated blood pressure reading 07/18/2017  . Normocytic normochromic anemia 07/18/2017  . Malnutrition of moderate degree 07/18/2017  . Normocytic anemia   . Frequent falls   . Renal  insufficiency   . Hyperlipidemia   . Heavy smoker   . Alcohol abuse   . Abnormal EKG 03/19/2013  . Pre-operative cardiovascular exam, new EKG abnormalities c/w ischemia 03/19/2013  . LVH (left ventricular hypertrophy) 03/19/2013  . Murmur, cardiac 03/19/2013   Home Medication(s) Prior to Admission medications   Medication Sig Start Date End Date Taking? Authorizing Provider  amLODipine (NORVASC) 10 MG tablet Take 1 tablet (10 mg total) by mouth daily. 07/27/17   [name], MD  ASPIRIN LOW DOSE 81 MG EC tablet Take 81 mg by mouth daily. 09/26/17   [provider]  atorvastatin (LIPITOR) 80 MG tablet Take 80 mg by mouth daily.    [provider]  diclofenac Sodium (VOLTAREN) 1 % GEL Apply 2 g topically 4 (four) times daily as needed (moderate-severe pain). 07/03/20   Harvie Heck, MD  losartan (COZAAR) 100 MG tablet Take 100 mg by mouth daily. 05/09/19   [provider]  oxybutynin (DITROPAN-XL) 10 MG 24 hr tablet Take 10 mg by mouth at bedtime. 06/17/19   [provider]  Past Surgical History Past Surgical History:  Procedure Laterality Date  . KNEE ARTHROSCOPY    . TONSILLECTOMY     Family History Family History  Problem Relation Age of Onset  . Heart attack Father     Social History Social History   Tobacco Use  . Smoking status: Former Smoker    Packs/day: 0.12    Years: 45.00    Pack years: 5.40    Types: Cigars, Cigarettes    Quit date: 10/19/2016    Years since quitting: 3.7  . Smokeless tobacco: Never Used  Vaping Use  . Vaping Use: Never used  Substance Use Topics  . Alcohol use: Yes    Alcohol/week: 3.0 standard drinks    Types: 3 Standard drinks or equivalent per week  . Drug use: No   Allergies Patient has no known allergies.  Review of Systems Review of Systems  HENT: Negative for hearing  loss.   Respiratory: Negative for shortness of breath.   Cardiovascular: Positive for syncope. Negative for chest pain and palpitations.  Gastrointestinal: Negative for blood in stool, nausea and vomiting.  Neurological: Positive for dizziness. Negative for headaches.   All other systems are reviewed and are negative for acute change except as noted in the HPI  Physical Exam Vital Signs  I have reviewed the triage vital signs BP (!) 144/85 (BP Location: Left Arm)   Pulse 83   Temp 98.2 F (36.8 C) (Oral)   Resp 20   SpO2 99%   Physical Exam Vitals reviewed.  Constitutional:      General: He is not in acute distress.    Appearance: He is well-developed and well-nourished. He is not diaphoretic.  HENT:     Head: Normocephalic and atraumatic.     Nose: Nose normal.  Eyes:     General: No scleral icterus.       Right eye: No discharge.        Left eye: No discharge.     Extraocular Movements: EOM normal.     Conjunctiva/sclera: Conjunctivae normal.     Pupils: Pupils are equal, round, and reactive to light.  Cardiovascular:     Rate and Rhythm: Normal rate and regular rhythm.     Heart sounds: No murmur heard. No friction rub. No gallop.   Pulmonary:     Effort: Pulmonary effort is normal. No respiratory distress.     Breath sounds: Normal breath sounds. No stridor. No rales.  Abdominal:     General: There is no distension.     Palpations: Abdomen is soft.     Tenderness: There is no abdominal tenderness.  Musculoskeletal:        General: No edema.     Cervical back: Normal range of motion and neck supple.     Left ankle: Swelling present. No deformity, ecchymosis or lacerations. Tenderness present over the lateral malleolus and medial malleolus. Normal range of motion.  Skin:    General: Skin is warm and dry.     Findings: No erythema or rash.  Neurological:     Mental Status: He is alert and oriented to person, place, and time.  Psychiatric:        Mood and Affect:  Mood and affect normal.     ED Results and Treatments Labs (all labs ordered are listed, but only abnormal results are displayed) Labs Reviewed  BASIC METABOLIC PANEL - Abnormal; Notable for the following components:      Result Value   Glucose,  Bld 120 (*)    BUN 24 (*)    Creatinine, Ser 1.44 (*)    Calcium 8.7 (*)    GFR, Estimated 52 (*)    All other components within normal limits  CBC - Abnormal; Notable for the following components:   MCH 25.7 (*)    MCHC 29.5 (*)    RDW 15.9 (*)    Platelets 124 (*)    All other components within normal limits  D-DIMER, QUANTITATIVE (NOT AT [facility]) - Abnormal; Notable for the following components:   D-Dimer, Quant 1.36 (*)    All other components within normal limits  CBG MONITORING, ED - Abnormal; Notable for the following components:   Glucose-Capillary 103 (*)    All other components within normal limits                                                                                                                         EKG  EKG Interpretation  Date/Time:  Sunday July 03 2020 19:42:12 EST Ventricular Rate:  79 PR Interval:  166 QRS Duration: 100 QT Interval:  386 QTC Calculation: 442 R Axis:   -34 Text Interpretation: Normal sinus rhythm Left axis deviation Moderate voltage criteria for LVH, may be normal variant ( R in aVL , Cornell product ) Possible Lateral infarct , age undetermined Abnormal ECG No acute changes Confirmed by Addison Lank [phone]) on 07/03/2020 11:11:04 PM      Radiology DG Ankle Complete Left  Result Date: 07/04/2020 CLINICAL DATA:  Swelling. EXAM: LEFT ANKLE COMPLETE - 3+ VIEW COMPARISON:  None. FINDINGS: Tiny curvilinear density just distal to the fibular tip may represent an avulsion fracture, age indeterminate. Normal alignment and ankle mortise. No joint effusion. No erosion or periosteal reaction. Circumferential soft tissue edema which is more prominent laterally. IMPRESSION: 1. Tiny curvilinear  density just distal to the fibular tip may represent an avulsion fracture, age indeterminate. 2. Circumferential soft tissue edema, more prominent laterally. Electronically Signed   By: Keith Rake M.D.   On: 07/04/2020 00:23   MR BRAIN WO CONTRAST  Result Date: 07/04/2020 CLINICAL DATA:  Nonspecific dizziness. EXAM: MRI HEAD WITHOUT CONTRAST TECHNIQUE: Multiplanar, multiecho pulse sequences of the brain and surrounding structures were obtained without intravenous contrast. COMPARISON:  Head CT 07/01/2020.  MRI 07/18/2017 and 07/16/2018 FINDINGS: Brain: Diffusion imaging does not show any acute or subacute infarction. Chronic small-vessel ischemic changes are seen affecting the pons. Old small vessel infarctions of the cerebellum. Old small vessel infarctions of the thalami and basal ganglia. Chronic small-vessel ischemic changes throughout the hemispheric white matter. No large vessel territory stroke. Scattered foci of hemosiderin deposition throughout the areas affected by ischemic change. No evidence of acute hemorrhage. No hydrocephalus or extra-axial collection. Findings are progressive since 2020. Vascular: Major vessels at the base of the brain show flow. Skull and upper cervical spine: Negative Sinuses/Orbits: Clear/normal Other: None IMPRESSION: No acute or reversible finding. Extensive chronic small-vessel ischemic changes throughout the  brain as outlined above, progressive since 2020. Numerous foci of hemosiderin deposition, consistent with chronic hypertensive small-vessel disease. Electronically Signed   By: Nelson Chimes M.D.   On: 07/04/2020 00:56    Pertinent labs & imaging results that were available during my care of the patient were reviewed by me and considered in my medical decision making (see chart for details).  Medications Ordered in ED Medications  sodium chloride 0.9 % bolus 500 mL (500 mLs Intravenous New Bag/Given 07/03/20 2350)  enoxaparin (LOVENOX) injection 100 mg (100  mg Subcutaneous Given 07/04/20 0122)                                                                                                                                    Procedures .1-3 Lead EKG Interpretation Performed by: Fatima Blank, MD Authorized by: Fatima Blank, MD     Interpretation: normal     ECG rate:  82   ECG rate assessment: normal     Rhythm: sinus rhythm     Ectopy: none     Conduction: normal      (including critical care time)  Medical Decision Making / ED Course I have reviewed the nursing notes for this encounter and the patient's prior records (if available in EHR or on provided paperwork).   Isaac Reyes was evaluated in Emergency Department on 07/04/2020 for the symptoms described in the history of present illness. He was evaluated in the context of the global COVID-19 pandemic, which necessitated consideration that the patient might be at risk for infection with the SARS-CoV-2 virus that causes COVID-19. Institutional protocols and algorithms that pertain to the evaluation of patients at risk for COVID-19 are in a state of rapid change based on information released by regulatory bodies including the CDC and federal and state organizations. These policies and algorithms were followed during the patient's care in the ED.  Lightheadedness Prodrome to syncope per patient. On going for several weeks. Had reassuring work up for syncope on 2/11 and discharged today. EKG tonight reassuring; w/o acute changes. Labs also relatively unchanged. Orthostatics negative. Given his h/o prior CVA, MRI obtained which was negative for new stroke. Recommended PCP and scheduled Cardiology follow up for loop recorder.   Left leg swelling. TTP to ankle - site of swelling. DG notable for age-indeterminant avulsion fracture. Provided with CAM walker. Dimer obtain given recent hospitalization and +. Will need to rule out DVT. Given treatment dose of lovenox.  AMB DVT US ordered for 2/14. Instructed to return for Ultrasound.      Final Clinical Impression(s) / ED Diagnoses Final diagnoses:  Swelling  Lightheadedness  Avulsion fracture of ankle, left, closed, initial encounter   The patient appears reasonably screened and/or stabilized for discharge and I doubt any other medical condition or other [facility] requiring further screening, evaluation, or treatment in the ED at this time prior to discharge. Safe for discharge  with strict return precautions.  Disposition: Discharge  Condition: Good  I have discussed the results, Dx and Tx plan with the patient/family who expressed understanding and agree(s) with the plan. Discharge instructions discussed at length. The patient/family was given strict return precautions who verbalized understanding of the instructions. No further questions at time of discharge.    ED Discharge Orders         Ordered    LE VENOUS        07/04/20 0142           Follow Up: [address] [place] [address] [phone]  Call  To schedule an appointment for close follow up, If symptoms do not improve or  worsen, in 3-5 days  Cardiology  Go to  as scheduled for heart monitor      This chart was dictated using voice recognition software.  Despite best efforts to proofread,  errors can occur which can change the documentation meaning.   Fatima Blank, MD 07/04/20 [phone]

## 2020-07-03 NOTE — Care Management (Signed)
1552 07-03-20 Case Manager spoke with patient regarding home health needs and services. Patient is declining home health physical therapy and all durable medical equipment. Patient will transition home via cab. MD is aware that patient declined services. No further needs from Case Manager at this time. Bethena Roys, RN,BSN Case Manager

## 2020-07-03 NOTE — ED Provider Notes (Incomplete)
[facility] EMERGENCY DEPARTMENT Provider Note  CSN: [id number] Arrival date & time: 07/03/20 1916  Chief Complaint(s) Dizziness  HPI Isaac Reyes is a 70 y.o. male    Dizziness Quality:  Lightheadedness Severity:  Moderate Onset quality:  Gradual Duration: several weeks. Timing:  Intermittent Progression:  Unchanged Chronicity:  Recurrent Context comment:  Standing for long period of time, usually about an hour Relieved by: sitting. Worsened by:  Nothing Associated symptoms: syncope   Associated symptoms: no blood in stool, no chest pain, no headaches, no hearing loss, no nausea, no palpitations, no shortness of breath, no vision changes and no vomiting    Patient was just discharged from the hospital earlier today after admission for syncope while standing and had reassuring work up with ECHO showing EF 0000000 with G1 diastolic dysfunction. Cardiac markers were flat. UDS was positive for cocaine.  Patient also reports several days of left leg swelling. Reports he was getting lovenox shots while admitted.   Past Medical History Past Medical History:  Diagnosis Date  . Acute CVA (cerebrovascular accident) ([place]) 06/2017   "speech issues since" (07/24/2017)  . AKI (acute kidney injury) ([place]) 07/24/2017  . Anemia   . High cholesterol   . Lupus ([place])    "skin kind"  . NSVT (nonsustained ventricular tachycardia) [facility])    Patient Active Problem List   Diagnosis Date Noted  . Syncope and collapse 07/01/2020  . Syncope 11/07/2017  . Benign essential HTN 07/26/2017  . Acute kidney failure ([place]) 07/24/2017  . CVA (cerebral vascular accident) ([place]) 07/19/2017  . 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 NSVT (nonsustained ventricular tachycardia) ([place]) 07/18/2017  . Elevated blood pressure reading 07/18/2017  . Normocytic normochromic anemia 07/18/2017  . Malnutrition of moderate degree 07/18/2017  . Normocytic anemia   . Frequent falls   . Renal  insufficiency   . Hyperlipidemia   . Heavy smoker   . Alcohol abuse   . Abnormal EKG 03/19/2013  . Pre-operative cardiovascular exam, new EKG abnormalities c/w ischemia 03/19/2013  . LVH (left ventricular hypertrophy) 03/19/2013  . Murmur, cardiac 03/19/2013   Home Medication(s) Prior to Admission medications   Medication Sig Start Date End Date Taking? Authorizing Provider  amLODipine (NORVASC) 10 MG tablet Take 1 tablet (10 mg total) by mouth daily. 07/27/17   [name], MD  ASPIRIN LOW DOSE 81 MG EC tablet Take 81 mg by mouth daily. 09/26/17   [provider]  atorvastatin (LIPITOR) 80 MG tablet Take 80 mg by mouth daily.    [provider]  diclofenac Sodium (VOLTAREN) 1 % GEL Apply 2 g topically 4 (four) times daily as needed (moderate-severe pain). 07/03/20   Harvie Heck, MD  losartan (COZAAR) 100 MG tablet Take 100 mg by mouth daily. 05/09/19   [provider]  oxybutynin (DITROPAN-XL) 10 MG 24 hr tablet Take 10 mg by mouth at bedtime. 06/17/19   [provider]  Past Surgical History Past Surgical History:  Procedure Laterality Date  . KNEE ARTHROSCOPY    . TONSILLECTOMY     Family History Family History  Problem Relation Age of Onset  . Heart attack Father     Social History Social History   Tobacco Use  . Smoking status: Former Smoker    Packs/day: 0.12    Years: 45.00    Pack years: 5.40    Types: Cigars, Cigarettes    Quit date: 10/19/2016    Years since quitting: 3.7  . Smokeless tobacco: Never Used  Vaping Use  . Vaping Use: Never used  Substance Use Topics  . Alcohol use: Yes    Alcohol/week: 3.0 standard drinks    Types: 3 Standard drinks or equivalent per week  . Drug use: No   Allergies Patient has no known allergies.  Review of Systems Review of Systems  HENT: Negative for hearing  loss.   Respiratory: Negative for shortness of breath.   Cardiovascular: Positive for syncope. Negative for chest pain and palpitations.  Gastrointestinal: Negative for blood in stool, nausea and vomiting.  Neurological: Positive for dizziness. Negative for headaches.   All other systems are reviewed and are negative for acute change except as noted in the HPI  Physical Exam Vital Signs  I have reviewed the triage vital signs BP (!) 144/85 (BP Location: Left Arm)   Pulse 83   Temp 98.2 F (36.8 C) (Oral)   Resp 20   SpO2 99%   Physical Exam Vitals reviewed.  Constitutional:      General: He is not in acute distress.    Appearance: He is well-developed and well-nourished. He is not diaphoretic.  HENT:     Head: Normocephalic and atraumatic.     Nose: Nose normal.  Eyes:     General: No scleral icterus.       Right eye: No discharge.        Left eye: No discharge.     Extraocular Movements: EOM normal.     Conjunctiva/sclera: Conjunctivae normal.     Pupils: Pupils are equal, round, and reactive to light.  Cardiovascular:     Rate and Rhythm: Normal rate and regular rhythm.     Heart sounds: No murmur heard. No friction rub. No gallop.   Pulmonary:     Effort: Pulmonary effort is normal. No respiratory distress.     Breath sounds: Normal breath sounds. No stridor. No rales.  Abdominal:     General: There is no distension.     Palpations: Abdomen is soft.     Tenderness: There is no abdominal tenderness.  Musculoskeletal:        General: No edema.     Cervical back: Normal range of motion and neck supple.     Left ankle: Swelling present. No deformity, ecchymosis or lacerations. Tenderness present over the lateral malleolus and medial malleolus. Normal range of motion.  Skin:    General: Skin is warm and dry.     Findings: No erythema or rash.  Neurological:     Mental Status: He is alert and oriented to person, place, and time.  Psychiatric:        Mood and Affect:  Mood and affect normal.     ED Results and Treatments Labs (all labs ordered are listed, but only abnormal results are displayed) Labs Reviewed  BASIC METABOLIC PANEL - Abnormal; Notable for the following components:      Result Value   Glucose,  Bld 120 (*)    BUN 24 (*)    Creatinine, Ser 1.44 (*)    Calcium 8.7 (*)    GFR, Estimated 52 (*)    All other components within normal limits  CBC - Abnormal; Notable for the following components:   MCH 25.7 (*)    MCHC 29.5 (*)    RDW 15.9 (*)    Platelets 124 (*)    All other components within normal limits  CBG MONITORING, ED - Abnormal; Notable for the following components:   Glucose-Capillary 103 (*)    All other components within normal limits  D-DIMER, QUANTITATIVE (NOT AT [facility])                                                                                                                         EKG  EKG Interpretation  Date/Time:  Sunday July 03 2020 19:42:12 EST Ventricular Rate:  79 PR Interval:  166 QRS Duration: 100 QT Interval:  386 QTC Calculation: 442 R Axis:   -34 Text Interpretation: Normal sinus rhythm Left axis deviation Moderate voltage criteria for LVH, may be normal variant ( R in aVL , Cornell product ) Possible Lateral infarct , age undetermined Abnormal ECG No acute changes Confirmed by Addison Lank [phone]) on 07/03/2020 11:11:04 PM      Radiology No results found.  Pertinent labs & imaging results that were available during my care of the patient were reviewed by me and considered in my medical decision making (see chart for details).  Medications Ordered in ED Medications  sodium chloride 0.9 % bolus 500 mL (has no administration in time range)                                                                                                                                    Procedures .1-3 Lead EKG Interpretation Performed by: Fatima Blank, MD Authorized by: Fatima Blank, MD     Interpretation: normal     ECG rate:  82   ECG rate assessment: normal     Rhythm: sinus rhythm     Ectopy: none     Conduction: normal      (including critical care time)  Medical Decision Making / ED Course I have reviewed the nursing notes for this encounter and the patient's prior records (if available in EHR or on provided paperwork).   Lavon Paganini  Dugo was evaluated in Emergency Department on 07/03/2020 for the symptoms described in the history of present illness. He was evaluated in the context of the global COVID-19 pandemic, which necessitated consideration that the patient might be at risk for infection with the SARS-CoV-2 virus that causes COVID-19. Institutional protocols and algorithms that pertain to the evaluation of patients at risk for COVID-19 are in a state of rapid change based on information released by regulatory bodies including the CDC and federal and state organizations. These policies and algorithms were followed during the patient's care in the ED.  ***      Final Clinical Impression(s) / ED Diagnoses Final diagnoses:  Swelling      This chart was dictated using voice recognition software.  Despite best efforts to proofread,  errors can occur which can change the documentation meaning.

## 2020-07-03 NOTE — ED Triage Notes (Signed)
Pt presents to ED POV. Pt c/o dizziness x1w and LE edema. Pt reports that he was just seen here and d/c but dizziness is worse. Pt also c/o increased edema to LE. Denies SOB

## 2020-07-03 NOTE — Progress Notes (Signed)
Nsg Discharge Note  Patient discharging home via cab. Declined HHPT and all DME.   Admit Date:  07/01/2020 Discharge date: 07/03/2020   Lavon Paganini Betley to be D/C'd Home per MD order.  AVS completed.  Copy for chart, and copy for patient signed, and dated. Patient/caregiver able to verbalize understanding.  Discharge Medication: Allergies as of 07/03/2020   No Known Allergies     Medication List    TAKE these medications   amLODipine 10 MG tablet Commonly known as: NORVASC Take 1 tablet (10 mg total) by mouth daily.   Aspirin Low Dose 81 MG EC tablet Generic drug: aspirin Take 81 mg by mouth daily.   atorvastatin 80 MG tablet Commonly known as: LIPITOR Take 80 mg by mouth daily.   diclofenac Sodium 1 % Gel Commonly known as: VOLTAREN Apply 2 g topically 4 (four) times daily as needed (moderate-severe pain).   losartan 100 MG tablet Commonly known as: COZAAR Take 100 mg by mouth daily.   oxybutynin 10 MG 24 hr tablet Commonly known as: DITROPAN-XL Take 10 mg by mouth at bedtime.            Durable Medical Equipment  (From admission, onward)         Start     Ordered   07/03/20 1507  DME 3-in-1  Once        07/03/20 1509   07/03/20 1507  DME Walker  Once       Question Answer Comment  Walker: With 5 Inch Wheels   Patient needs a walker to treat with the following condition Gait instability      07/03/20 1509          Discharge Assessment: Vitals:   07/03/20 0512 07/03/20 1206  BP: (!) 156/94 129/77  Pulse: 67 65  Resp: 18 18  Temp: 99 F (37.2 C) 98.5 F (36.9 C)  SpO2: 98% 97%   Skin clean, dry and intact without evidence of skin break down, no evidence of skin tears noted. IV catheter discontinued intact. Site without signs and symptoms of complications - no redness or edema noted at insertion site, patient denies c/o pain - only slight tenderness at site.  Dressing with slight pressure applied.  D/c Instructions-Education: Discharge  instructions given to patient/family with verbalized understanding. D/c education completed with patient/family including follow up instructions, medication list, d/c activities limitations if indicated, with other d/c instructions as indicated by MD - patient able to verbalize understanding, all questions fully answered. Patient instructed to return to ED, call 911, or call MD for any changes in condition.  Patient escorted via [place], and D/C home via private auto.  Erasmo Leventhal, RN 07/03/2020 4:14 PM

## 2020-07-03 NOTE — Discharge Summary (Addendum)
Name: Isaac Reyes MRN: [id number] DOB: [birth year] 70 y.o. PCP: Sonia Side., FNP  Date of Admission: 07/01/2020 12:22 PM Date of Discharge: 07/03/2020 Attending Physician: Dr Lenice Pressman  Discharge Diagnosis: 1. Syncope 2. Left knee pain 3. AKI on CKD 4. Hypertension 5. Hx of CVA  Discharge Medications: Allergies as of 07/03/2020   No Known Allergies      Medication List     TAKE these medications    amLODipine 10 MG tablet Commonly known as: NORVASC Take 1 tablet (10 mg total) by mouth daily.   Aspirin Low Dose 81 MG EC tablet Generic drug: aspirin Take 81 mg by mouth daily.   atorvastatin 80 MG tablet Commonly known as: LIPITOR Take 80 mg by mouth daily.   diclofenac Sodium 1 % Gel Commonly known as: VOLTAREN Apply 2 g topically 4 (four) times daily as needed (moderate-severe pain).   losartan 100 MG tablet Commonly known as: COZAAR Take 100 mg by mouth daily.   oxybutynin 10 MG 24 hr tablet Commonly known as: DITROPAN-XL Take 10 mg by mouth at bedtime.               Durable Medical Equipment  (From admission, onward)           Start     Ordered   07/03/20 1507  DME 3-in-1  Once        07/03/20 1509   07/03/20 1507  DME Walker  Once       Question Answer Comment  Walker: With 5 Inch Wheels   Patient needs a walker to treat with the following condition Gait instability      07/03/20 1509           ADMISSION HPI: Mr Isaac Reyes is a 70 year old male with PMHx of CVA with mild dysarthria, coronary artery disease, alcohol use disorder, lupus, nonsustained ventricular tachycardia presenting for evaluation of witnessed syncope.  Patient was in his usual state of health and talking to his friend when he suddenly passed out. He denies any prodromal syndromes. Denies any head trauma or current headache. He denies any palpitations, lightheadedness/dizziness, new or worsening focal weakness.  He also denies history of  seizure. Has had similar episode in the past, and was previously admitted in 2019 for syncope. Work up was negative for orthostatic hypotension  Disposition and follow-up:   Mr.Isaac Reyes was discharged from [facility] in Stable condition.  At the hospital follow up visit please address:  1.  Syncope:  -Recommended to follow up with cardiology for loop recorder; please make sure patient gets this to continue to evaluate for any arrhythmia  - Advise for cocaine use cessation as this can contribute to arrhythmias  Left knee pain: - Xray negative for any fracture, dislocation or effusion - Recommend for voltaren gel qid  AKI on CKDIII: Back to baseline of ~1.4 on discharge; F/u BMP  2.  Labs / imaging needed at time of follow-up: BMP  3.  Pending labs/ test needing follow-up: None  Follow-up Appointments:  Follow-up Information     [facility].   Specialty: Cardiology Why: Our office will contact you to arrange a follow-up visit to discuss a loop recorder which will monitor your heart for any abnormal heart rhythms that may have caused you to pass out. Contact information: [address] [place]  Sonia Side., FNP. Schedule an appointment as soon as possible for a visit in 1 week(s).   Specialty: Family Medicine Contact information: [place] [address] [place] Hospital Course by problem list: 1. Syncope Patient presented with second episode of sudden onset syncope.  Patient was talking with friends and suddenly passed out without any prodromal symptoms.  He was reportedly back to baseline quickly without any postictal symptoms.  Similar episode in June 2019.  Speculated to be of cardiac etiology. He was subsequently scheduled to have loop recorder placed; however, he was unable to follow through this.  Given  sudden onset of symptoms and lack of prodrome, suspect this to be cardiac in nature as well.  Work-up in this admission including echo and cardiac monitoring without any acute findings.  Left ventricular EF is 55 to 60% with mild concentric left ventricle hypertrophy and grade 1 diastolic dysfunction.  No valvular abnormalities noted.  Cardiac monitoring with some PACs and PVCs; however, no significant sustained arrhythmia noted.  Cardiology consulted for outpatient cardiac monitoring.  He is to be scheduled for loop recorder placement as outpatient.  2.  Left knee pain Patient endorsing left knee pain when working with physical therapy.  In setting of sudden syncope, concern for possible trauma.  X-ray of the left knee without any acute findings but does show significant osteoarthritic changes.  Patient prescribed Voltaren gel.   3.  AKI on CKD 3 Noted to have mildly elevated serum creatinine on admission.  Suspect this is secondary to decreased oral intake.  Acutely normalized with holding his antihypertensive and encouraging p.o. intake.  Patient was resumed on his antihypertensive on discharge.  Please follow-up with BMP  Discharge Exam:   BP 129/77 (BP Location: Right Arm)   Pulse 65   Temp 98.5 F (36.9 C) (Oral)   Resp 18   Ht '6\' 2"'$  (1.88 m)   Wt 99.9 kg   SpO2 97%   BMI 28.28 kg/m  Discharge exam:  Physical Exam  Constitutional: Appears well-developed and well-nourished. No distress.  HENT: Normocephalic and atraumatic, EOMI, conjunctiva normal, moist mucous membranes Cardiovascular: Normal rate, regular rhythm, S1 and S2 present, no murmurs, rubs, gallops.  Distal pulses intact Respiratory: No respiratory distress, no accessory muscle use.  Effort is normal.  Lungs are clear to auscultation bilaterally. GI: Nondistended, soft, nontender to palpation, normal active bowel sounds Musculoskeletal: Normal bulk and tone.  No peripheral edema noted. Neurological: Is alert and oriented  x4, no apparent focal deficits noted. Skin: Warm and dry.  No rash, erythema, lesions noted. Psychiatric: Normal mood and affect. Behavior is normal. Judgment and thought content normal.    Pertinent Labs, Studies, and Procedures:  CBC Latest Ref Rng & Units 07/03/2020 07/02/2020 07/01/2020  WBC 4.0 - 10.5 K/uL 3.7(L) 5.9 5.6  Hemoglobin 13.0 - 17.0 g/dL 12.0(L) 12.2(L) 13.5  Hematocrit 39.0 - 52.0 % 39.0 40.0 42.8  Platelets 150 - 400 K/uL 147(L) 151 167   BMP Latest Ref Rng & Units 07/03/2020 07/02/2020 07/01/2020  Glucose 70 - 99 mg/dL 153(H) 106(H) 102(H)  BUN 8 - 23 mg/dL 16 28(H) 32(H)  Creatinine 0.61 - 1.24 mg/dL 1.39(H) 1.55(H) 1.79(H)  Sodium 135 - 145 mmol/L 138 139 139  Potassium 3.5 - 5.1 mmol/L 3.7 3.4(L) 3.6  Chloride 98 - 111 mmol/L 106 108 104  CO2 22 - 32 mmol/L 25 25  25  Calcium 8.9 - 10.3 mg/dL 8.2(L) 8.3(L) 8.7(L)   CXR 07/01/2020: IMPRESSION: Elevation of left hemidiaphragm with mild left basilar atelectasis.  CT HEAD WO CONTRAST 07/01/2020: IMPRESSION: 1. Generalized cerebral atrophy. 2. Small chronic bilateral basal ganglia lacunar infarcts. 3. No acute intracranial abnormality.  ECHO 07/02/2020: IMPRESSIONS   1. Very limited study due to poor acoustic windows. Based on limited  views, the left ventricular ejection fraction, by estimation, is 55 to  60%. The left ventricle has normal function. Left ventricular endocardial  border not optimally defined to  evaluate regional wall motion. There is mild concentric left ventricular  hypertrophy. Left ventricular diastolic parameters are consistent with  Grade I diastolic dysfunction (impaired relaxation).   2. Right ventricular systolic function was not well visualized. The right  ventricular size appears normal based on limited views.   3. The mitral valve is normal in structure. Trivial mitral valve  regurgitation.   4. The aortic valve was not well visualized. Aortic valve regurgitation  is not visualized.  Mild aortic valve sclerosis is present, with no  evidence of aortic valve stenosis.   5. The inferior vena cava is normal in size with <50% respiratory  variability, suggesting right atrial pressure of 8 mmHg.   LEFT KNEE X-RAY 07/02/2020: IMPRESSION: No fracture, dislocation, or joint effusion. 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  Discharge Instructions: Discharge Instructions     Call MD for:  difficulty breathing, headache or visual disturbances   Complete by: As directed    Call MD for:  extreme fatigue   Complete by: As directed    Call MD for:  hives   Complete by: As directed    Call MD for:  persistant dizziness or light-headedness   Complete by: As directed    Call MD for:  persistant nausea and vomiting   Complete by: As directed    Call MD for:  severe uncontrolled pain   Complete by: As directed    Diet - low sodium heart healthy   Complete by: As directed    Discharge instructions   Complete by: As directed    Mr. Enzo Simonin,  You were admitted to the hospital after passing out.  You were observed in the hospital and evaluated for the cause of this.  On discharge, please follow-up with cardiology for loop recorder to monitor your heart rhythm.  As we discussed, you may have an abnormal heart rhythms that may have contributed to passing out.  You also endorsed knee pain during this hospitalization.  X-rays for this were normal.  Please follow-up with your primary care provider for hospital follow-up and to address your knee pain.  Take care!   Increase activity slowly   Complete by: As directed        Signed: Harvie Heck, MD  [facility] PGY-2 07/03/2020, 5:47 PM   Pager: [phone]

## 2020-07-04 ENCOUNTER — Emergency Department (HOSPITAL_COMMUNITY): Payer: Medicare Other

## 2020-07-04 LAB — D-DIMER, QUANTITATIVE: D-Dimer, Quant: 1.36 ug/mL-FEU — ABNORMAL HIGH (ref 0.00–0.50)

## 2020-07-04 MED ORDER — ENOXAPARIN SODIUM 100 MG/ML ~~LOC~~ SOLN
1.0000 mg/kg | Freq: Once | SUBCUTANEOUS | Status: AC
  Administered 2020-07-04: 100 mg via SUBCUTANEOUS
  Filled 2020-07-04: qty 1

## 2020-07-04 NOTE — ED Notes (Signed)
Cam boot applied prior to d/c

## 2020-07-04 NOTE — Discharge Instructions (Addendum)
Please return later on today (07/04/20) to have your leg ultrasounded to look for a possible blood clot. If positive, you will return to the ED to be started on medication. If negative, please follow up with your PCP.  Also please keep your appointment with the cardiologist for placement of your loop recorder.

## 2020-07-04 NOTE — ED Notes (Signed)
Pt d/c by MD and is provided w/ d/c instructions and follow up care, Pt is out of the ED in wheel chair  

## 2020-07-12 ENCOUNTER — Encounter: Payer: Self-pay | Admitting: Medical

## 2020-07-18 ENCOUNTER — Emergency Department (HOSPITAL_COMMUNITY)
Admission: EM | Admit: 2020-07-18 | Discharge: 2020-07-18 | Disposition: A | Discharge status: Home / Self Care | Payer: Medicare Other | Attending: Emergency Medicine | Admitting: Emergency Medicine

## 2020-07-18 ENCOUNTER — Encounter (HOSPITAL_COMMUNITY): Payer: Self-pay | Admitting: Emergency Medicine

## 2020-07-18 ENCOUNTER — Other Ambulatory Visit: Payer: Self-pay

## 2020-07-18 ENCOUNTER — Emergency Department (HOSPITAL_BASED_OUTPATIENT_CLINIC_OR_DEPARTMENT_OTHER): Payer: Medicare Other

## 2020-07-18 DIAGNOSIS — Z79899 Other long term (current) drug therapy: Secondary | ICD-10-CM | POA: Insufficient documentation

## 2020-07-18 DIAGNOSIS — L931 Subacute cutaneous lupus erythematosus: Secondary | ICD-10-CM | POA: Diagnosis not present

## 2020-07-18 DIAGNOSIS — M7989 Other specified soft tissue disorders: Secondary | ICD-10-CM | POA: Diagnosis not present

## 2020-07-18 DIAGNOSIS — Z87891 Personal history of nicotine dependence: Secondary | ICD-10-CM | POA: Insufficient documentation

## 2020-07-18 DIAGNOSIS — E8809 Other disorders of plasma-protein metabolism, not elsewhere classified: Secondary | ICD-10-CM | POA: Insufficient documentation

## 2020-07-18 DIAGNOSIS — M79605 Pain in left leg: Secondary | ICD-10-CM | POA: Insufficient documentation

## 2020-07-18 DIAGNOSIS — I10 Essential (primary) hypertension: Secondary | ICD-10-CM | POA: Insufficient documentation

## 2020-07-18 DIAGNOSIS — R609 Edema, unspecified: Secondary | ICD-10-CM | POA: Diagnosis present

## 2020-07-18 LAB — CBC WITH DIFFERENTIAL/PLATELET
Abs Immature Granulocytes: 0.02 10*3/uL (ref 0.00–0.07)
Basophils Absolute: 0 10*3/uL (ref 0.0–0.1)
Basophils Relative: 0 %
Eosinophils Absolute: 0.1 10*3/uL (ref 0.0–0.5)
Eosinophils Relative: 2 %
HCT: 41 % (ref 39.0–52.0)
Hemoglobin: 12.9 g/dL — ABNORMAL LOW (ref 13.0–17.0)
Immature Granulocytes: 1 %
Lymphocytes Relative: 29 %
Lymphs Abs: 1.1 10*3/uL (ref 0.7–4.0)
MCH: 26.4 pg (ref 26.0–34.0)
MCHC: 31.5 g/dL (ref 30.0–36.0)
MCV: 84 fL (ref 80.0–100.0)
Monocytes Absolute: 0.6 10*3/uL (ref 0.1–1.0)
Monocytes Relative: 16 %
Neutro Abs: 2.1 10*3/uL (ref 1.7–7.7)
Neutrophils Relative %: 52 %
Platelets: 191 10*3/uL (ref 150–400)
RBC: 4.88 MIL/uL (ref 4.22–5.81)
RDW: 15.7 % — ABNORMAL HIGH (ref 11.5–15.5)
WBC: 4 10*3/uL (ref 4.0–10.5)
nRBC: 0 % (ref 0.0–0.2)

## 2020-07-18 LAB — COMPREHENSIVE METABOLIC PANEL
ALT: 26 U/L (ref 0–44)
AST: 26 U/L (ref 15–41)
Albumin: 2.9 g/dL — ABNORMAL LOW (ref 3.5–5.0)
Alkaline Phosphatase: 81 U/L (ref 38–126)
Anion gap: 10 (ref 5–15)
BUN: 12 mg/dL (ref 8–23)
CO2: 27 mmol/L (ref 22–32)
Calcium: 8.8 mg/dL — ABNORMAL LOW (ref 8.9–10.3)
Chloride: 102 mmol/L (ref 98–111)
Creatinine, Ser: 1.37 mg/dL — ABNORMAL HIGH (ref 0.61–1.24)
GFR, Estimated: 55 mL/min — ABNORMAL LOW (ref >60)
Glucose, Bld: 94 mg/dL (ref 70–99)
Potassium: 3.5 mmol/L (ref 3.5–5.1)
Sodium: 139 mmol/L (ref 135–145)
Total Bilirubin: 1.2 mg/dL (ref 0.3–1.2)
Total Protein: 6.4 g/dL — ABNORMAL LOW (ref 6.5–8.1)

## 2020-07-18 NOTE — Discharge Instructions (Signed)
To help the swelling your legs, elevate them above your heart is much as possible.  Consider getting some compression stockings to wear which go from the toes up above your knees.  This can help the swelling.  Also your doctor can help with treatments and monitoring.  Try to avoid salt in your diet, which can contribute to swelling.  Your albumin is a little bit low, and may be contributing to your swelling.  Your doctor can help you with that.

## 2020-07-18 NOTE — ED Provider Notes (Signed)
[place] EMERGENCY DEPARTMENT Provider Note   CSN: [id number] Arrival date & time: 07/18/20  1342     History Chief Complaint  Patient presents with  . Ankle Pain    Isaac Reyes is a 70 y.o. male.  HPI He presents for evaluation of left leg pain and bilateral lower leg swelling.  Onset of symptoms several weeks ago, he was given a walking boot 2 weeks ago but it hurts when he wears it on the left.  At that time he had a possible distal fibula avulsion fracture.  He did not follow-up for ultrasound of the left leg as ordered, at that time.  He denies shortness of breath, weakness or dizziness.  He denies fever, chills, nausea or vomiting.  He is taking his usual medicines as prescribed.  There are no other known modifying factors.  Past Medical History:  Diagnosis Date  . Acute CVA (cerebrovascular accident) ([place]) 06/2017   "speech issues since" (07/24/2017)  . AKI (acute kidney injury) ([place]) 07/24/2017  . Anemia   . High cholesterol   . Lupus ([place])    "skin kind"  . NSVT (nonsustained ventricular tachycardia) [facility])     Patient Active Problem List   Diagnosis Date Noted  . Syncope and collapse 07/01/2020  . Syncope 11/07/2017  . Benign essential HTN 07/26/2017  . Acute kidney failure ([place]) 07/24/2017  . CVA (cerebral vascular accident) ([place]) 07/19/2017  . 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 NSVT (nonsustained ventricular tachycardia) ([place]) 07/18/2017  . Elevated blood pressure reading 07/18/2017  . Normocytic normochromic anemia 07/18/2017  . Malnutrition of moderate degree 07/18/2017  . Normocytic anemia   . Frequent falls   . Renal insufficiency   . Hyperlipidemia   . Heavy smoker   . Alcohol abuse   . Abnormal EKG 03/19/2013  . Pre-operative cardiovascular exam, new EKG abnormalities c/w ischemia 03/19/2013  . LVH (left ventricular hypertrophy) 03/19/2013  . Murmur, cardiac 03/19/2013    Past Surgical History:  Procedure Laterality  Date  . KNEE ARTHROSCOPY    . TONSILLECTOMY         Family History  Problem Relation Age of Onset  . Heart attack Father     Social History   Tobacco Use  . Smoking status: Former Smoker    Packs/day: 0.12    Years: 45.00    Pack years: 5.40    Types: Cigars, Cigarettes    Quit date: 10/19/2016    Years since quitting: 3.7  . Smokeless tobacco: Never Used  Vaping Use  . Vaping Use: Never used  Substance Use Topics  . Alcohol use: Yes    Alcohol/week: 3.0 standard drinks    Types: 3 Standard drinks or equivalent per week  . Drug use: No    Home Medications Prior to Admission medications   Medication Sig Start Date End Date Taking? Authorizing Provider  amLODipine (NORVASC) 10 MG tablet Take 1 tablet (10 mg total) by mouth daily. 07/27/17   [name], MD  ASPIRIN LOW DOSE 81 MG EC tablet Take 81 mg by mouth daily. 09/26/17   [provider]  atorvastatin (LIPITOR) 80 MG tablet Take 80 mg by mouth daily.    [provider]  diclofenac Sodium (VOLTAREN) 1 % GEL Apply 2 g topically 4 (four) times daily as needed (moderate-severe pain). 07/03/20   Harvie Heck, MD  losartan (COZAAR) 100 MG tablet Take 100 mg by mouth daily. 05/09/19   [provider]  oxybutynin (  DITROPAN-XL) 10 MG 24 hr tablet Take 10 mg by mouth at bedtime. 06/17/19   [provider]    Allergies    Patient has no known allergies.  Review of Systems   Review of Systems  All other systems reviewed and are negative.   Physical Exam Updated Vital Signs BP (!) 150/92 (BP Location: Right Arm)   Pulse 80   Temp 97.9 F (36.6 C) (Oral)   Resp 18   SpO2 98%   Physical Exam Vitals and nursing note reviewed.  Constitutional:      General: He is not in acute distress.    Appearance: He is well-developed and well-nourished. He is not ill-appearing, toxic-appearing or diaphoretic.  HENT:     Head: Normocephalic and atraumatic.     Right Ear: External ear normal.      Left Ear: External ear normal.  Eyes:     Extraocular Movements: EOM normal.     Conjunctiva/sclera: Conjunctivae normal.     Pupils: Pupils are equal, round, and reactive to light.  Neck:     Trachea: Phonation normal.  Cardiovascular:     Rate and Rhythm: Normal rate.  Pulmonary:     Effort: Pulmonary effort is normal.  Chest:     Chest wall: No bony tenderness.  Abdominal:     Palpations: Abdomen is soft.     Tenderness: There is no abdominal tenderness.  Musculoskeletal:     Cervical back: Normal range of motion and neck supple.     Comments: Bilateral lower leg swelling, with moderate swelling entire left leg.  Mild tenderness of the left lower leg and ankle.  Skin:    General: Skin is warm, dry and intact.  Neurological:     Mental Status: He is alert and oriented to person, place, and time.     Cranial Nerves: No cranial nerve deficit.     Sensory: No sensory deficit.     Motor: No abnormal muscle tone.     Coordination: Coordination normal.  Psychiatric:        Mood and Affect: Mood and affect and mood normal.        Behavior: Behavior normal.        Thought Content: Thought content normal.        Judgment: Judgment normal.     ED Results / Procedures / Treatments   Labs (all labs ordered are listed, but only abnormal results are displayed) Labs Reviewed  COMPREHENSIVE METABOLIC PANEL - Abnormal; Notable for the following components:      Result Value   Creatinine, Ser 1.37 (*)    Calcium 8.8 (*)    Total Protein 6.4 (*)    Albumin 2.9 (*)    GFR, Estimated 55 (*)    All other components within normal limits  CBC WITH DIFFERENTIAL/PLATELET - Abnormal; Notable for the following components:   Hemoglobin 12.9 (*)    RDW 15.7 (*)    All other components within normal limits    EKG None  Radiology VAS [place] LOWER EXTREMITY VENOUS (DVT) (ONLY [facility] & [facility] 7a-7p)  Result Date: 07/18/2020  Lower Venous DVT Study Indications: Swelling.  Risk Factors: None identified.  Comparison Study: No previous venous Performing Technologist: Vonzell Schlatter RVT  Examination Guidelines: A complete evaluation includes B-mode imaging, spectral Doppler, color Doppler, and power Doppler as needed of all accessible portions of each vessel. Bilateral testing is considered an integral part of a complete examination. Limited examinations for reoccurring indications may be  performed as noted. The reflux portion of the exam is performed with the patient in reverse Trendelenburg.  +---------+---------------+---------+-----------+----------+--------------+ RIGHT    CompressibilityPhasicitySpontaneityPropertiesThrombus Aging +---------+---------------+---------+-----------+----------+--------------+ CFV      Full           Yes      Yes                                 +---------+---------------+---------+-----------+----------+--------------+ SFJ      Full                                                        +---------+---------------+---------+-----------+----------+--------------+ FV Prox  Full                                                        +---------+---------------+---------+-----------+----------+--------------+ FV Mid   Full                                                        +---------+---------------+---------+-----------+----------+--------------+ FV DistalFull                                                        +---------+---------------+---------+-----------+----------+--------------+ PFV      Full                                                        +---------+---------------+---------+-----------+----------+--------------+ POP      Full           Yes      Yes                                 +---------+---------------+---------+-----------+----------+--------------+ PTV      Full                                                        +---------+---------------+---------+-----------+----------+--------------+ PERO      Full                                                        +---------+---------------+---------+-----------+----------+--------------+   +---------+---------------+---------+-----------+----------+--------------+ LEFT     CompressibilityPhasicitySpontaneityPropertiesThrombus Aging +---------+---------------+---------+-----------+----------+--------------+ CFV      Full           Yes  Yes                                 +---------+---------------+---------+-----------+----------+--------------+ SFJ      Full                                                        +---------+---------------+---------+-----------+----------+--------------+ FV Prox  Full                                                        +---------+---------------+---------+-----------+----------+--------------+ FV Mid   Full                                                        +---------+---------------+---------+-----------+----------+--------------+ FV DistalFull                                                        +---------+---------------+---------+-----------+----------+--------------+ PFV      Full                                                        +---------+---------------+---------+-----------+----------+--------------+ POP      Full           Yes      Yes                                 +---------+---------------+---------+-----------+----------+--------------+ PTV      Full                                                        +---------+---------------+---------+-----------+----------+--------------+ PERO     Full                                                        +---------+---------------+---------+-----------+----------+--------------+  Summary: BILATERAL: - No evidence of deep vein thrombosis seen in the lower extremities, bilaterally. -No evidence of popliteal cyst, bilaterally.   *See table(s) above for measurements and observations.  Electronically signed by Servando Snare MD on 07/18/2020 at 5:50:22 PM.    Final     Procedures Procedures   Medications Ordered in ED Medications - No data to display  ED Course  I have reviewed the triage vital signs and the nursing  notes.  Pertinent labs & imaging results that were available during my care of the patient were reviewed by me and considered in my medical decision making (see chart for details).    MDM Rules/Calculators/A&P                           Patient Vitals for the past 24 hrs:  BP Temp Temp src Pulse Resp SpO2  07/18/20 1734 (!) 150/92 97.9 F (36.6 C) Oral 80 18 98 %    At time of discharge- reevaluation with update and discussion. After initial assessment and treatment, an updated evaluation reveals he remains comfortable has no further complaints.  Findings discussed and questions answered. Daleen Bo   Medical Decision Making:  This patient is presenting for evaluation of left leg pain, with swelling, which does require a range of treatment options, and is a complaint that involves a moderate risk of morbidity and mortality. The differential diagnoses include DVT, peripheral edema, central edema. I decided to review old records, and in summary elderly male presenting with ongoing lower leg swelling, left greater than right, with concern for DVT.  He is not tolerating a walking boot that was given for a avulsion fracture of the left distal fibula.  I did not require additional historical information from anyone.  Clinical Laboratory Tests Ordered, included CBC and Metabolic panel. Review indicates normal except creatinine high, calcium low, total protein low, albumin low.     Medical Test ordered and Reviewed the venous Doppler both leg medicine Test.  No DVT.  Critical Interventions-clinical evaluation, laboratory testing, ultrasound ordered, observation, reassessment  After These Interventions, the Patient was reevaluated and was found stable  for discharge.  Evaluation consistent with symptomatic peripheral edema.  Patient has normal systolic ejection fraction with grade 1 diastolic dysfunction.  No evidence for DVT on Doppler imaging today.  Counseled patient on treatment for peripheral edema with follow-up care recommended by PCP.  Patient does not need to wear the cam walker which was prescribed for distal fibula fracture.  CRITICAL CARE-no Performed by: Daleen Bo  Nursing Notes Reviewed/ Care Coordinated Applicable Imaging Reviewed Interpretation of Laboratory Data incorporated into ED treatment  The patient appears reasonably screened and/or stabilized for discharge and I doubt any other medical condition or other [facility] requiring further screening, evaluation, or treatment in the ED at this time prior to discharge.  Plan: Home Medications-continue usual; Home Treatments-elevate legs, as much as possible, wear compression stockings; return here if the recommended treatment, does not improve the symptoms; Recommended follow up-PCP follow-up 1 week for checkup.     Final Clinical Impression(s) / ED Diagnoses Final diagnoses:  Peripheral edema  Pain of left lower extremity  Hypoalbuminemia    Rx / DC Orders ED Discharge Orders    None       Daleen Bo, MD 07/19/20 [phone]

## 2020-07-18 NOTE — Social Work (Signed)
CSW met with Pt at bedside. Pt needs transportation assistance back to motel. CSW provided taxi voucher 

## 2020-07-18 NOTE — ED Notes (Signed)
Pt to US.

## 2020-07-18 NOTE — ED Notes (Signed)
Pt returned from US

## 2020-07-18 NOTE — ED Triage Notes (Signed)
Patient BIB [facility] from home. Patient fell 3 days ago seen here dx of broken ankle. Sent home with a boot. Upon arrival patient complaining of ankle pain upon walking, not wearing boot states it hurt him to wear it.

## 2020-07-18 NOTE — Progress Notes (Signed)
Bilateral lower extremity venous study completed.      Please see CV Proc for preliminary results.   Lisa Gibson, RVT  

## 2020-07-26 ENCOUNTER — Emergency Department (HOSPITAL_COMMUNITY)
Admission: EM | Admit: 2020-07-26 | Discharge: 2020-07-26 | Disposition: A | Discharge status: Home / Self Care | Payer: Medicare Other | Attending: Emergency Medicine | Admitting: Emergency Medicine

## 2020-07-26 ENCOUNTER — Other Ambulatory Visit: Payer: Self-pay

## 2020-07-26 DIAGNOSIS — R42 Dizziness and giddiness: Secondary | ICD-10-CM | POA: Diagnosis present

## 2020-07-26 DIAGNOSIS — Z79899 Other long term (current) drug therapy: Secondary | ICD-10-CM | POA: Diagnosis not present

## 2020-07-26 DIAGNOSIS — R609 Edema, unspecified: Secondary | ICD-10-CM | POA: Diagnosis not present

## 2020-07-26 DIAGNOSIS — Z789 Other specified health status: Secondary | ICD-10-CM

## 2020-07-26 DIAGNOSIS — I10 Essential (primary) hypertension: Secondary | ICD-10-CM | POA: Insufficient documentation

## 2020-07-26 DIAGNOSIS — Z87891 Personal history of nicotine dependence: Secondary | ICD-10-CM | POA: Diagnosis not present

## 2020-07-26 DIAGNOSIS — Z8673 Personal history of transient ischemic attack (TIA), and cerebral infarction without residual deficits: Secondary | ICD-10-CM | POA: Diagnosis not present

## 2020-07-26 DIAGNOSIS — Z20822 Contact with and (suspected) exposure to covid-19: Secondary | ICD-10-CM | POA: Diagnosis not present

## 2020-07-26 DIAGNOSIS — Z7409 Other reduced mobility: Secondary | ICD-10-CM | POA: Diagnosis not present

## 2020-07-26 DIAGNOSIS — Z7982 Long term (current) use of aspirin: Secondary | ICD-10-CM | POA: Insufficient documentation

## 2020-07-26 LAB — COMPREHENSIVE METABOLIC PANEL
ALT: 16 U/L (ref 0–44)
AST: 19 U/L (ref 15–41)
Albumin: 2.9 g/dL — ABNORMAL LOW (ref 3.5–5.0)
Alkaline Phosphatase: 64 U/L (ref 38–126)
Anion gap: 11 (ref 5–15)
BUN: 28 mg/dL — ABNORMAL HIGH (ref 8–23)
CO2: 26 mmol/L (ref 22–32)
Calcium: 8.6 mg/dL — ABNORMAL LOW (ref 8.9–10.3)
Chloride: 97 mmol/L — ABNORMAL LOW (ref 98–111)
Creatinine, Ser: 1.89 mg/dL — ABNORMAL HIGH (ref 0.61–1.24)
GFR, Estimated: 38 mL/min — ABNORMAL LOW (ref >60)
Glucose, Bld: 110 mg/dL — ABNORMAL HIGH (ref 70–99)
Potassium: 3.7 mmol/L (ref 3.5–5.1)
Sodium: 134 mmol/L — ABNORMAL LOW (ref 135–145)
Total Bilirubin: 1.7 mg/dL — ABNORMAL HIGH (ref 0.3–1.2)
Total Protein: 6.7 g/dL (ref 6.5–8.1)

## 2020-07-26 LAB — CBC WITH DIFFERENTIAL/PLATELET
Abs Immature Granulocytes: 0.02 10*3/uL (ref 0.00–0.07)
Basophils Absolute: 0 10*3/uL (ref 0.0–0.1)
Basophils Relative: 0 %
Eosinophils Absolute: 0 10*3/uL (ref 0.0–0.5)
Eosinophils Relative: 0 %
HCT: 38.3 % — ABNORMAL LOW (ref 39.0–52.0)
Hemoglobin: 12.3 g/dL — ABNORMAL LOW (ref 13.0–17.0)
Immature Granulocytes: 0 %
Lymphocytes Relative: 13 %
Lymphs Abs: 1.1 10*3/uL (ref 0.7–4.0)
MCH: 26.7 pg (ref 26.0–34.0)
MCHC: 32.1 g/dL (ref 30.0–36.0)
MCV: 83.1 fL (ref 80.0–100.0)
Monocytes Absolute: 1.4 10*3/uL — ABNORMAL HIGH (ref 0.1–1.0)
Monocytes Relative: 17 %
Neutro Abs: 5.8 10*3/uL (ref 1.7–7.7)
Neutrophils Relative %: 70 %
Platelets: 161 10*3/uL (ref 150–400)
RBC: 4.61 MIL/uL (ref 4.22–5.81)
RDW: 15.7 % — ABNORMAL HIGH (ref 11.5–15.5)
WBC: 8.3 10*3/uL (ref 4.0–10.5)
nRBC: 0 % (ref 0.0–0.2)

## 2020-07-26 LAB — RAPID URINE DRUG SCREEN, HOSP PERFORMED
Amphetamines: NOT DETECTED
Barbiturates: NOT DETECTED
Benzodiazepines: NOT DETECTED
Cocaine: POSITIVE — AB
Opiates: NOT DETECTED
Tetrahydrocannabinol: NOT DETECTED

## 2020-07-26 LAB — RESP PANEL BY RT-PCR (FLU A&B, COVID) ARPGX2
Influenza A by PCR: NEGATIVE
Influenza B by PCR: NEGATIVE
SARS Coronavirus 2 by RT PCR: NEGATIVE

## 2020-07-26 LAB — URINALYSIS, ROUTINE W REFLEX MICROSCOPIC
Bacteria, UA: NONE SEEN
Bilirubin Urine: NEGATIVE
Glucose, UA: NEGATIVE mg/dL
Ketones, ur: NEGATIVE mg/dL
Leukocytes,Ua: NEGATIVE
Nitrite: NEGATIVE
Protein, ur: NEGATIVE mg/dL
Specific Gravity, Urine: 1.012 (ref 1.005–1.030)
pH: 6 (ref 5.0–8.0)

## 2020-07-26 LAB — VITAMIN B12: Vitamin B-12: 465 pg/mL (ref 180–914)

## 2020-07-26 LAB — ETHANOL: Alcohol, Ethyl (B): <10 mg/dL (ref <10)

## 2020-07-26 LAB — FOLATE: Folate: 13.9 ng/mL (ref >5.9)

## 2020-07-26 LAB — CK: Total CK: 108 U/L (ref 49–397)

## 2020-07-26 LAB — PROTIME-INR
INR: 1.2 (ref 0.8–1.2)
Prothrombin Time: 15 seconds (ref 11.4–15.2)

## 2020-07-26 MED ORDER — AMLODIPINE BESYLATE 5 MG PO TABS
10.0000 mg | ORAL_TABLET | Freq: Once | ORAL | Status: AC
  Administered 2020-07-26: 10 mg via ORAL
  Filled 2020-07-26: qty 2

## 2020-07-26 MED ORDER — ASPIRIN EC 81 MG PO TBEC
81.0000 mg | DELAYED_RELEASE_TABLET | Freq: Once | ORAL | Status: AC
  Administered 2020-07-26: 81 mg via ORAL
  Filled 2020-07-26: qty 1

## 2020-07-26 MED ORDER — ACETAMINOPHEN 500 MG PO TABS: 1000.0000 mg | ORAL_TABLET | Freq: Four times a day (QID) | ORAL | 0 refills | Status: DC | PRN

## 2020-07-26 MED ORDER — ATORVASTATIN CALCIUM 10 MG PO TABS
80.0000 mg | ORAL_TABLET | Freq: Every day | ORAL | Status: DC
  Administered 2020-07-26: 80 mg via ORAL
  Filled 2020-07-26: qty 8

## 2020-07-26 MED ORDER — AMLODIPINE BESYLATE 5 MG PO TABS: 10.0000 mg | ORAL_TABLET | Freq: Every day | ORAL | Status: DC

## 2020-07-26 MED ORDER — ASPIRIN EC 81 MG PO TBEC: 81.0000 mg | DELAYED_RELEASE_TABLET | Freq: Every day | ORAL | Status: DC

## 2020-07-26 NOTE — Evaluation (Signed)
Physical Therapy Evaluation Patient Details Name: Isaac Reyes MRN: [id number] DOB: Jul 13, 1950 Today's Date: 07/26/2020   History of Present Illness  Pt is a 70 y/o male presenting to the ED 3/8 secondary to weakness and inability to care for himself. Pt with multiple recent presentations for the same. PMH includes CVA and lupus.  Clinical Impression  Pt presenting with problem above with deficits below. Pt presenting with increased weakness and fatigue which limited gait distance. Requiring min A for mobility tasks in ED room. Pt with tremors in BLE following gait. Pt currently lives alone and has had increased difficulty caring for himself. Recommending SNF level therapies at d/c. Will continue to follow acutely.     Follow Up Recommendations SNF;Supervision/Assistance - 24 hour    Equipment Recommendations  Rolling walker with 5" wheels    Recommendations for Other Services       Precautions / Restrictions Precautions Precautions: Fall Restrictions Weight Bearing Restrictions: No Other Position/Activity Restrictions: Per notes, no need for CAM walker.      Mobility  Bed Mobility Overal bed mobility: Needs Assistance Bed Mobility: Supine to Sit;Sit to Supine     Supine to sit: Min assist Sit to supine: Supervision   General bed mobility comments: Min A for trunk elevation to come to sitting.    Transfers Overall transfer level: Needs assistance Equipment used: None Transfers: Sit to/from Stand Sit to Stand: Min assist         General transfer comment: Min A for steadying assist to stand.  Ambulation/Gait Ambulation/Gait assistance: Min assist Gait Distance (Feet): 5 Feet Assistive device: None Gait Pattern/deviations: Step-through pattern;Decreased stride length Gait velocity: Decreased   General Gait Details: Pt with increased weakness and fatigue which limited ambulation to ED room. Pt holding on to objects in room for support. Further distance limited  secondary to fatigue.  Stairs            Wheelchair Mobility    Modified Rankin (Stroke Patients Only)       Balance Overall balance assessment: Needs assistance Sitting-balance support: No upper extremity supported;Feet supported Sitting balance-Leahy Scale: Fair     Standing balance support: No upper extremity supported;During functional activity Standing balance-Leahy Scale: Fair                               Pertinent Vitals/Pain Pain Assessment: No/denies pain    Home Living Family/patient expects to be discharged to:: Other (Comment) (motel) Living Arrangements: Alone Available Help at Discharge:  (reports no one) Type of Home: Other(Comment) (motel) Home Access: Level entry     Home Layout: One level Home Equipment: Cane - single point      Prior Function Level of Independence: Independent               Hand Dominance        Extremity/Trunk Assessment   Upper Extremity Assessment Upper Extremity Assessment: Generalized weakness    Lower Extremity Assessment Lower Extremity Assessment: Generalized weakness (tremulous after ambulation)    Cervical / Trunk Assessment Cervical / Trunk Assessment: Normal  Communication   Communication: No difficulties  Cognition Arousal/Alertness: Awake/alert Behavior During Therapy: WFL for tasks assessed/performed Overall Cognitive Status: No family/caregiver present to determine baseline cognitive functioning  General Comments      Exercises     Assessment/Plan    PT Assessment Patient needs continued PT services  PT Problem List Decreased strength;Decreased balance;Decreased activity tolerance;Decreased mobility;Decreased safety awareness;Decreased knowledge of precautions;Decreased knowledge of use of DME       PT Treatment Interventions DME instruction;Gait training;Stair training;Functional mobility training;Therapeutic  activities;Therapeutic exercise;Balance training;Patient/family education    PT Goals (Current goals can be found in the Care Plan section)  Acute Rehab PT Goals Patient Stated Goal: to go to rehab to get stronger PT Goal Formulation: With patient Time For Goal Achievement: 08/09/20 Potential to Achieve Goals: Good    Frequency Min 2X/week   Barriers to discharge        Co-evaluation               AM-PAC PT "6 Clicks" Mobility  Outcome Measure Help needed turning from your back to your side while in a flat bed without using bedrails?: A Little Help needed moving from lying on your back to sitting on the side of a flat bed without using bedrails?: A Little Help needed moving to and from a bed to a chair (including a wheelchair)?: A Little Help needed standing up from a chair using your arms (e.g., wheelchair or bedside chair)?: A Little Help needed to walk in hospital room?: A Little Help needed climbing 3-5 steps with a railing? : A Lot 6 Click Score: 17    End of Session Equipment Utilized During Treatment: Gait belt Activity Tolerance: Patient limited by fatigue Patient left: in bed;with call bell/phone within reach (on stretcher in ED) Nurse Communication: Mobility status PT Visit Diagnosis: Unsteadiness on feet (R26.81);Muscle weakness (generalized) (M62.81)    Time: [id number] PT Time Calculation (min) (ACUTE ONLY): 15 min   Charges:   PT Evaluation $PT Eval Moderate Complexity: 1 Mod          Reuel Derby, PT, DPT  Acute Rehabilitation Services  Pager: [phone] Office: [phone]   Rudean Hitt 07/26/2020, 10:16 AM

## 2020-07-26 NOTE — Progress Notes (Signed)
Clinicals have been sent to [employer] for authorization. Reference number [id number]. Once authorization is confirmed patient can be discharged to [facility].

## 2020-07-26 NOTE — Discharge Planning (Signed)
RNCM consulted regarding safe discharge planning (Home with Home Health vs Skilled Nursing Facility Placement).  Physical Therapy evaluation placed; will follow up after recommendations from PT.     

## 2020-07-26 NOTE — TOC Initial Note (Signed)
Transition of Care [facility]) - Initial/Assessment Note    Patient Details  Name: Isaac Reyes MRN: [id number] Date of Birth: Sep 22, 1950  [facility]) CM/SW Contact:    Raina Mina, [place] Phone Number: 07/26/2020, 10:30 AM  Clinical Narrative: CSW discussed recommendations from physical therapy for patient to go to SNF placement. Patient was agreeable and stated that he would participate. Patient stated he does not have a support system or any family. Patients goal is to return home after the completion of his therapy.                   Expected Discharge Plan: Skilled Nursing Facility Barriers to Discharge: Continued Medical Work up   Patient Goals and CMS Choice Patient states their goals for this hospitalization and ongoing recovery are:: Patient stated he would like to go home after he recovers      Expected Discharge Plan and Services Expected Discharge Plan: [place]                                              Prior Living Arrangements/Services   Lives with:: Self Patient language and need for interpreter reviewed:: Yes Do you feel safe going back to the place where you live?: Yes      Need for Family Participation in Patient Care: No (Comment) Care giver support system in place?: No (comment)   Criminal Activity/Legal Involvement Pertinent to Current Situation/Hospitalization: No - Comment as needed  Activities of Daily Living      Permission Sought/Granted                  Emotional Assessment Appearance:: Appears stated age Attitude/Demeanor/Rapport: Engaged Affect (typically observed): Accepting Orientation: : Oriented to Self,Oriented to Place,Oriented to  Time,Oriented to Situation Alcohol / Substance Use: Not Applicable Psych Involvement: No (comment)  Admission diagnosis:  Gen Weakness Patient Active Problem List   Diagnosis Date Noted  . Syncope and collapse 07/01/2020  . Syncope 11/07/2017  .  Benign essential HTN 07/26/2017  . Acute kidney failure ([place]) 07/24/2017  . CVA (cerebral vascular accident) ([place]) 07/19/2017  . 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 NSVT (nonsustained ventricular tachycardia) ([place]) 07/18/2017  . Elevated blood pressure reading 07/18/2017  . Normocytic normochromic anemia 07/18/2017  . Malnutrition of moderate degree 07/18/2017  . Normocytic anemia   . Frequent falls   . Renal insufficiency   . Hyperlipidemia   . Heavy smoker   . Alcohol abuse   . Abnormal EKG 03/19/2013  . Pre-operative cardiovascular exam, new EKG abnormalities c/w ischemia 03/19/2013  . LVH (left ventricular hypertrophy) 03/19/2013  . Murmur, cardiac 03/19/2013   PCP:  Sonia Side., FNP Pharmacy:   [facility] [place], [place] - [address] [place] [address] Phone: [phone] Fax: [phone]     Social Determinants of Health (SDOH) Interventions    Readmission Risk Interventions No flowsheet data found.

## 2020-07-26 NOTE — [place] FL2 (Signed)
  [place] MEDICAID FL2 LEVEL OF CARE SCREENING TOOL     IDENTIFICATION  Patient Name: Isaac Reyes Birthdate: 1951/03/19 Sex: male Admission Date (Current Location): 07/26/2020  [facility] and [place] Number:  Herbalist and Address:  [facility], [place] [address], [place], [place] [address]      Provider Number: [id number]  Attending Physician Name and Address:  Charlesetta Shanks, MD  Relative Name and Phone Number:       Current Level of Care: Hospital Recommended Level of Care: [place] Prior Approval Number:    Date Approved/Denied:   PASRR Number: [id number]  Discharge Plan: SNF    Current Diagnoses: Patient Active Problem List   Diagnosis Date Noted  . Syncope and collapse 07/01/2020  . Syncope 11/07/2017  . Benign essential HTN 07/26/2017  . Acute kidney failure ([place]) 07/24/2017  . CVA (cerebral vascular accident) ([place]) 07/19/2017  . 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 NSVT (nonsustained ventricular tachycardia) ([place]) 07/18/2017  . Elevated blood pressure reading 07/18/2017  . Normocytic normochromic anemia 07/18/2017  . Malnutrition of moderate degree 07/18/2017  . Normocytic anemia   . Frequent falls   . Renal insufficiency   . Hyperlipidemia   . Heavy smoker   . Alcohol abuse   . Abnormal EKG 03/19/2013  . Pre-operative cardiovascular exam, new EKG abnormalities c/w ischemia 03/19/2013  . LVH (left ventricular hypertrophy) 03/19/2013  . Murmur, cardiac 03/19/2013    Orientation RESPIRATION BLADDER Height & Weight     Self,Time,Situation,Place  Normal Continent Weight: 218 lb 4.1 oz (99 kg) Height:  '6\' 2"'$  (188 cm)  BEHAVIORAL SYMPTOMS/MOOD NEUROLOGICAL BOWEL NUTRITION STATUS      Continent    AMBULATORY STATUS COMMUNICATION OF NEEDS Skin   Extensive Assist Verbally Normal                       Personal Care Assistance Level of Assistance    Bathing Assistance: Independent Feeding  assistance: Independent       Functional Limitations Info  Speech,Hearing,Sight Sight Info: Adequate Hearing Info: Adequate Speech Info: Adequate    SPECIAL CARE FACTORS FREQUENCY                       Contractures Contractures Info: Present    Additional Factors Info  Code Status Code Status Info: DNR             Current Medications (07/26/2020):  This is the current hospital active medication list No current facility-administered medications for this encounter.   Current Outpatient Medications  Medication Sig Dispense Refill  . amLODipine (NORVASC) 10 MG tablet Take 1 tablet (10 mg total) by mouth daily. 30 tablet 0  . ASPIRIN LOW DOSE 81 MG EC tablet Take 81 mg by mouth daily.  3  . atorvastatin (LIPITOR) 80 MG tablet Take 80 mg by mouth daily.    . diclofenac Sodium (VOLTAREN) 1 % GEL Apply 2 g topically 4 (four) times daily as needed (moderate-severe pain). 50 g 0  . losartan (COZAAR) 100 MG tablet Take 100 mg by mouth daily.    Marland Kitchen oxybutynin (DITROPAN-XL) 10 MG 24 hr tablet Take 10 mg by mouth at bedtime.       Discharge Medications: Please see discharge summary for a list of discharge medications.  Relevant Imaging Results:  Relevant Lab Results:   Additional Information    Raina Mina, LCSWA

## 2020-07-26 NOTE — Progress Notes (Signed)
CSW spoke with patient about the recommendations of going to a skilled nursing facility. Patient stated he would participate. CSW  Let patient know there is a bed available at [facility]. Patient stated he would be willing to go.

## 2020-07-26 NOTE — ED Provider Notes (Addendum)
[facility] EMERGENCY DEPARTMENT Provider Note   CSN: [id number] Arrival date & time: 07/26/20  0750     History Chief Complaint  Patient presents with  . Weakness    Isaac Reyes is a 70 y.o. male.  HPI Patient reports that ever since he got discharged from having had a stroke 2 weeks ago, he has not been able to function independently.  He lives alone and has no assistance.  He reports he feels dizzy persistently.  Denies he has visual changes.  He reports the dizziness has been a problem ever since his stroke.  He reports when he tries to get up it takes him an extremely long time and he almost cannot do it.  He denies he has had any recent falls.  He reports he is not taking his prescribed medications.  He reports that his medications make him feel poorly and he is not taking them.  He denies has been sick.  He denies he had any fevers, chills.  He has not had vomiting or diarrhea.  He reports when he got discharged after his stroke and did not except evaluation for home health and outside assistance, he did not recognize how hard things were going to be at home.  He reports now he realizes that he asked to have help because he cannot function independently and he lives alone at a motel.  He denies any alcohol use.  No drugs of abuse.    Past Medical History:  Diagnosis Date  . Acute CVA (cerebrovascular accident) ([place]) 06/2017   "speech issues since" (07/24/2017)  . AKI (acute kidney injury) ([place]) 07/24/2017  . Anemia   . High cholesterol   . Lupus ([place])    "skin kind"  . NSVT (nonsustained ventricular tachycardia) [facility])     Patient Active Problem List   Diagnosis Date Noted  . Syncope and collapse 07/01/2020  . Syncope 11/07/2017  . Benign essential HTN 07/26/2017  . Acute kidney failure ([place]) 07/24/2017  . CVA (cerebral vascular accident) ([place]) 07/19/2017  . 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 NSVT (nonsustained ventricular tachycardia) ([place])  07/18/2017  . Elevated blood pressure reading 07/18/2017  . Normocytic normochromic anemia 07/18/2017  . Malnutrition of moderate degree 07/18/2017  . Normocytic anemia   . Frequent falls   . Renal insufficiency   . Hyperlipidemia   . Heavy smoker   . Alcohol abuse   . Abnormal EKG 03/19/2013  . Pre-operative cardiovascular exam, new EKG abnormalities c/w ischemia 03/19/2013  . LVH (left ventricular hypertrophy) 03/19/2013  . Murmur, cardiac 03/19/2013    Past Surgical History:  Procedure Laterality Date  . KNEE ARTHROSCOPY    . TONSILLECTOMY         Family History  Problem Relation Age of Onset  . Heart attack Father     Social History   Tobacco Use  . Smoking status: Former Smoker    Packs/day: 0.12    Years: 45.00    Pack years: 5.40    Types: Cigars, Cigarettes    Quit date: 10/19/2016    Years since quitting: 3.7  . Smokeless tobacco: Never Used  Vaping Use  . Vaping Use: Never used  Substance Use Topics  . Alcohol use: Yes    Alcohol/week: 3.0 standard drinks    Types: 3 Standard drinks or equivalent per week  . Drug use: No    Home Medications Prior to Admission medications   Medication Sig Start Date End Date Taking?  Authorizing Provider  amLODipine (NORVASC) 10 MG tablet Take 1 tablet (10 mg total) by mouth daily. 07/27/17   [name], MD  ASPIRIN LOW DOSE 81 MG EC tablet Take 81 mg by mouth daily. 09/26/17   [provider]  atorvastatin (LIPITOR) 80 MG tablet Take 80 mg by mouth daily.    [provider]  diclofenac Sodium (VOLTAREN) 1 % GEL Apply 2 g topically 4 (four) times daily as needed (moderate-severe pain). 07/03/20   Harvie Heck, MD  losartan (COZAAR) 100 MG tablet Take 100 mg by mouth daily. 05/09/19   [provider]  oxybutynin (DITROPAN-XL) 10 MG 24 hr tablet Take 10 mg by mouth at bedtime. 06/17/19   [provider]    Allergies    Patient has no known allergies.  Review of Systems   Review of  Systems 10 systems reviewed and negative except as per HPI Physical Exam Updated Vital Signs BP (!) 164/98   Pulse 95   Temp (!) 97.4 F (36.3 C) (Oral)   Resp (!) 27   Ht '6\' 2"'$  (1.88 m)   Wt 99 kg   SpO2 98%   BMI 28.02 kg/m   Physical Exam Constitutional:      Comments: Patient is alert and nontoxic.  No respiratory distress.  Mental status clear.  HENT:     Head: Normocephalic and atraumatic.     Comments: Patches of alopecia or scarring in the scalp.    Mouth/Throat:     Mouth: Mucous membranes are moist.     Pharynx: Oropharynx is clear.  Eyes:     Extraocular Movements: Extraocular movements intact.  Cardiovascular:     Rate and Rhythm: Normal rate and regular rhythm.     Pulses: Normal pulses.     Heart sounds: Normal heart sounds.  Pulmonary:     Effort: Pulmonary effort is normal.     Breath sounds: Normal breath sounds.  Abdominal:     General: There is no distension.     Palpations: Abdomen is soft.     Tenderness: There is no abdominal tenderness. There is no guarding.  Musculoskeletal:     Comments: Bilateral ankle and lower leg edema 1-2+.  Slightly greater on the left than right.  Skin:    General: Skin is warm and dry.  Neurological:     Comments: Mental status clear.  Speech clear.  Grip strength 5\5.  Patient has some subtle incoordination finger-nose exam bilaterally.  He is able to perform the test but with slight incoordination.  Patient can elevate both lower extremities off of the bed independently and hold.  It is not tested at this time.  We will do gait test with PT.  Psychiatric:     Comments: Mood is slightly depressed but patient has good eye contact and is appropriately interactive.     ED Results / Procedures / Treatments   Labs (all labs ordered are listed, but only abnormal results are displayed) Labs Reviewed  COMPREHENSIVE METABOLIC PANEL - Abnormal; Notable for the following components:      Result Value   Sodium 134 (*)     Chloride 97 (*)    Glucose, Bld 110 (*)    BUN 28 (*)    Creatinine, Ser 1.89 (*)    Calcium 8.6 (*)    Albumin 2.9 (*)    Total Bilirubin 1.7 (*)    GFR, Estimated 38 (*)    All other components within normal limits  CBC  WITH DIFFERENTIAL/PLATELET - Abnormal; Notable for the following components:   Hemoglobin 12.3 (*)    HCT 38.3 (*)    RDW 15.7 (*)    Monocytes Absolute 1.4 (*)    All other components within normal limits  RESP PANEL BY RT-PCR (FLU A&B, COVID) ARPGX2  PROTIME-INR  ETHANOL  URINALYSIS, ROUTINE W REFLEX MICROSCOPIC  RAPID URINE DRUG SCREEN, HOSP PERFORMED  VITAMIN B1  VITAMIN B6  VITAMIN B12  FOLATE    EKG None  Radiology No results found.  Procedures Procedures   Medications Ordered in ED Medications - No data to display  ED Course  I have reviewed the triage vital signs and the nursing notes.  Pertinent labs & imaging results that were available during my care of the patient were reviewed by me and considered in my medical decision making (see chart for details).    MDM Rules/Calculators/A&P                          Patient presents as outlined.  He is having lifestyle limiting failure of ADLs.  He reports inability to stand and ambulate about his living environment to perform basic care needs.  He is 2 weeks status post stroke.  No appearance of acute new stroke.  Although patient reports he is noncompliant with medications, blood pressure is not significantly elevated.  Will check basic lab work and consult PT and social work\case management to help establish ongoing outpatient care.  Patient is accepted for SNF placement.  He has had failure of ADLs after new stroke.  He is also had noncompliance with antihypertensive medications and aspirin.  On arrival patient is normotensive.  He describes feeling worse when he takes his medications.  Will discontinue Cozaar at this time.  We will continue with amlodipine and recommend close monitoring of  blood pressure medications with compliance.  We will continue aspirin and Lipitor as prescribed for stroke risk reduction.  He describes difficulty with all independent activities.  I agree with placement plan. Final Clinical Impression(s) / ED Diagnoses Final diagnoses:  Impaired mobility and ADLs  Status post CVA    Rx / DC Orders ED Discharge Orders    None       Charlesetta Shanks, MD 07/26/20 1136    Charlesetta Shanks, MD 07/26/20 1146

## 2020-07-26 NOTE — ED Notes (Signed)
F/U PTAR called @ 1613-per [place], RN called by Iyahna Obriant-6th patient on List.

## 2020-07-26 NOTE — ED Triage Notes (Signed)
Pt BIB [facility] from home c/o generalized weakness. Pt states he was hospitalized 2 weeks ago for a stroke and ever since then has been to weak to walk. Pt alert and oriented. No neuro deficits noted, equal strength on both sides. Pt denies any pain at this time. Pt states he started having loose stools this morning, pt arrived covered in feces. Pt able to stand and undress himself.

## 2020-07-26 NOTE — Discharge Instructions (Addendum)
1.  At this time continue Norvasc 10 mg daily, aspirin 81 mg daily, atorvastatin 80 mg daily, and acetaminophen 1000 mg every 6 hours as needed for pain.

## 2020-07-29 LAB — VITAMIN B6: Vitamin B6: 24 ug/L (ref 5.3–46.7)

## 2020-07-31 LAB — VITAMIN B1: Vitamin B1 (Thiamine): 97.2 nmol/L (ref 66.5–200.0)

## 2020-09-04 ENCOUNTER — Emergency Department (HOSPITAL_COMMUNITY): Payer: Medicare Other

## 2020-09-04 ENCOUNTER — Emergency Department (HOSPITAL_COMMUNITY)
Admission: EM | Admit: 2020-09-04 | Discharge: 2020-09-04 | Disposition: A | Discharge status: Home / Self Care | Payer: Medicare Other | Attending: Emergency Medicine | Admitting: Emergency Medicine

## 2020-09-04 ENCOUNTER — Other Ambulatory Visit: Payer: Self-pay

## 2020-09-04 DIAGNOSIS — R059 Cough, unspecified: Secondary | ICD-10-CM

## 2020-09-04 DIAGNOSIS — J069 Acute upper respiratory infection, unspecified: Secondary | ICD-10-CM | POA: Diagnosis not present

## 2020-09-04 DIAGNOSIS — J029 Acute pharyngitis, unspecified: Secondary | ICD-10-CM | POA: Diagnosis not present

## 2020-09-04 DIAGNOSIS — Z7982 Long term (current) use of aspirin: Secondary | ICD-10-CM | POA: Insufficient documentation

## 2020-09-04 DIAGNOSIS — R197 Diarrhea, unspecified: Secondary | ICD-10-CM

## 2020-09-04 DIAGNOSIS — Z20822 Contact with and (suspected) exposure to covid-19: Secondary | ICD-10-CM | POA: Diagnosis not present

## 2020-09-04 DIAGNOSIS — Z79899 Other long term (current) drug therapy: Secondary | ICD-10-CM | POA: Diagnosis not present

## 2020-09-04 DIAGNOSIS — Z87891 Personal history of nicotine dependence: Secondary | ICD-10-CM | POA: Diagnosis not present

## 2020-09-04 DIAGNOSIS — I10 Essential (primary) hypertension: Secondary | ICD-10-CM | POA: Diagnosis not present

## 2020-09-04 LAB — COMPREHENSIVE METABOLIC PANEL
ALT: 29 U/L (ref 0–44)
AST: 32 U/L (ref 15–41)
Albumin: 3.1 g/dL — ABNORMAL LOW (ref 3.5–5.0)
Alkaline Phosphatase: 79 U/L (ref 38–126)
Anion gap: 9 (ref 5–15)
BUN: 30 mg/dL — ABNORMAL HIGH (ref 8–23)
CO2: 29 mmol/L (ref 22–32)
Calcium: 8.5 mg/dL — ABNORMAL LOW (ref 8.9–10.3)
Chloride: 105 mmol/L (ref 98–111)
Creatinine, Ser: 1.64 mg/dL — ABNORMAL HIGH (ref 0.61–1.24)
GFR, Estimated: 45 mL/min — ABNORMAL LOW (ref >60)
Glucose, Bld: 115 mg/dL — ABNORMAL HIGH (ref 70–99)
Potassium: 3.7 mmol/L (ref 3.5–5.1)
Sodium: 143 mmol/L (ref 135–145)
Total Bilirubin: 0.9 mg/dL (ref 0.3–1.2)
Total Protein: 6.6 g/dL (ref 6.5–8.1)

## 2020-09-04 LAB — URINALYSIS, ROUTINE W REFLEX MICROSCOPIC
Bilirubin Urine: NEGATIVE
Glucose, UA: NEGATIVE mg/dL
Hgb urine dipstick: NEGATIVE
Ketones, ur: NEGATIVE mg/dL
Leukocytes,Ua: NEGATIVE
Nitrite: NEGATIVE
Protein, ur: NEGATIVE mg/dL
Specific Gravity, Urine: 1.01 (ref 1.005–1.030)
pH: 7 (ref 5.0–8.0)

## 2020-09-04 LAB — CBC WITH DIFFERENTIAL/PLATELET
Abs Immature Granulocytes: 0.06 10*3/uL (ref 0.00–0.07)
Basophils Absolute: 0 10*3/uL (ref 0.0–0.1)
Basophils Relative: 0 %
Eosinophils Absolute: 0 10*3/uL (ref 0.0–0.5)
Eosinophils Relative: 0 %
HCT: 42.5 % (ref 39.0–52.0)
Hemoglobin: 13.1 g/dL (ref 13.0–17.0)
Immature Granulocytes: 1 %
Lymphocytes Relative: 11 %
Lymphs Abs: 0.9 10*3/uL (ref 0.7–4.0)
MCH: 25.5 pg — ABNORMAL LOW (ref 26.0–34.0)
MCHC: 30.8 g/dL (ref 30.0–36.0)
MCV: 82.8 fL (ref 80.0–100.0)
Monocytes Absolute: 0.7 10*3/uL (ref 0.1–1.0)
Monocytes Relative: 8 %
Neutro Abs: 6.9 10*3/uL (ref 1.7–7.7)
Neutrophils Relative %: 80 %
Platelets: 219 10*3/uL (ref 150–400)
RBC: 5.13 MIL/uL (ref 4.22–5.81)
RDW: 16.5 % — ABNORMAL HIGH (ref 11.5–15.5)
WBC: 8.6 10*3/uL (ref 4.0–10.5)
nRBC: 0 % (ref 0.0–0.2)

## 2020-09-04 LAB — RESP PANEL BY RT-PCR (FLU A&B, COVID) ARPGX2
Influenza A by PCR: NEGATIVE
Influenza B by PCR: NEGATIVE
SARS Coronavirus 2 by RT PCR: NEGATIVE

## 2020-09-04 LAB — GROUP A STREP BY PCR: Group A Strep by PCR: NOT DETECTED

## 2020-09-04 MED ORDER — SODIUM CHLORIDE 0.9 % IV BOLUS
1000.0000 mL | Freq: Once | INTRAVENOUS | Status: AC
  Administered 2020-09-04: 1000 mL via INTRAVENOUS

## 2020-09-04 NOTE — Discharge Instructions (Signed)
Your work-up today was overall reassuring.  Your COVID and flu test were negative.  There is no evidence of pneumonia on imaging.  Your strep test was negative for the sore throat, I suspect you have a viral infection causing all your symptoms.  Please rest and stay hydrated and follow-up with your primary doctor.  If any symptoms change or worsen, please return to the nearest emergency department.

## 2020-09-04 NOTE — ED Notes (Signed)
Pt arrived covered in feces. Writer and tech unable to do vitals or triage until we get pt showered. Charge aware.

## 2020-09-04 NOTE — ED Notes (Signed)
Unable to obtain urine sample from pt, pt states he does need to urinate at this time

## 2020-09-04 NOTE — ED Provider Notes (Signed)
[facility] DEPT Provider Note   CSN: [id number] Arrival date & time: 09/04/20  [id number]     History No chief complaint on file. diarrhea, sore throat, fatigue, shivering  Isaac Reyes is a 70 y.o. male.  The history is provided by the patient and medical records. No language interpreter was used.  Diarrhea Quality:  Watery Severity:  Moderate Onset quality:  Gradual Duration:  3 days Timing:  Constant Progression:  Unchanged Relieved by:  Nothing Worsened by:  Nothing Ineffective treatments:  None tried Associated symptoms: cough and URI   Associated symptoms: no abdominal pain, no chills, no diaphoresis, no fever, no headaches, no myalgias and no vomiting        Past Medical History:  Diagnosis Date  . Acute CVA (cerebrovascular accident) ([place]) 06/2017   "speech issues since" (07/24/2017)  . AKI (acute kidney injury) ([place]) 07/24/2017  . Anemia   . High cholesterol   . Lupus ([place])    "skin kind"  . NSVT (nonsustained ventricular tachycardia) [facility])     Patient Active Problem List   Diagnosis Date Noted  . Syncope and collapse 07/01/2020  . Syncope 11/07/2017  . Benign essential HTN 07/26/2017  . Acute kidney failure ([place]) 07/24/2017  . CVA (cerebral vascular accident) ([place]) 07/19/2017  . 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 NSVT (nonsustained ventricular tachycardia) ([place]) 07/18/2017  . Elevated blood pressure reading 07/18/2017  . Normocytic normochromic anemia 07/18/2017  . Malnutrition of moderate degree 07/18/2017  . Normocytic anemia   . Frequent falls   . Renal insufficiency   . Hyperlipidemia   . Heavy smoker   . Alcohol abuse   . Abnormal EKG 03/19/2013  . Pre-operative cardiovascular exam, new EKG abnormalities c/w ischemia 03/19/2013  . LVH (left ventricular hypertrophy) 03/19/2013  . Murmur, cardiac 03/19/2013    Past Surgical History:  Procedure Laterality Date  . KNEE ARTHROSCOPY    . TONSILLECTOMY          Family History  Problem Relation Age of Onset  . Heart attack Father     Social History   Tobacco Use  . Smoking status: Former Smoker    Packs/day: 0.12    Years: 45.00    Pack years: 5.40    Types: Cigars, Cigarettes    Quit date: 10/19/2016    Years since quitting: 3.8  . Smokeless tobacco: Never Used  Vaping Use  . Vaping Use: Never used  Substance Use Topics  . Alcohol use: Yes    Alcohol/week: 3.0 standard drinks    Types: 3 Standard drinks or equivalent per week  . Drug use: No    Home Medications Prior to Admission medications   Medication Sig Start Date End Date Taking? Authorizing Provider  acetaminophen (TYLENOL) 500 MG tablet Take 2 tablets (1,000 mg total) by mouth every 6 (six) hours as needed. 07/26/20   Charlesetta Shanks, MD  amLODipine (NORVASC) 10 MG tablet Take 1 tablet (10 mg total) by mouth daily. 07/27/17   [name], MD  ASPIRIN LOW DOSE 81 MG EC tablet Take 81 mg by mouth daily. 09/26/17   [provider]  atorvastatin (LIPITOR) 80 MG tablet Take 80 mg by mouth daily.    [provider]  oxybutynin (DITROPAN-XL) 10 MG 24 hr tablet Take 10 mg by mouth at bedtime. 06/17/19   [provider]  losartan (COZAAR) 100 MG tablet Take 100 mg by mouth daily.  07/26/20  [provider]    Allergies  Patient has no known allergies.  Review of Systems   Review of Systems  Constitutional: Positive for fatigue. Negative for chills, diaphoresis and fever.  HENT: Positive for congestion.   Eyes: Negative for visual disturbance.  Respiratory: Positive for cough. Negative for chest tightness, shortness of breath and wheezing.   Cardiovascular: Negative for chest pain, palpitations and leg swelling.  Gastrointestinal: Positive for diarrhea. Negative for abdominal pain, constipation, nausea and vomiting.  Genitourinary: Negative for dysuria, flank pain and frequency.  Musculoskeletal: Negative for back pain, myalgias and neck  stiffness.  Skin: Negative for rash and wound.  Neurological: Negative for dizziness, seizures, light-headedness and headaches.  Psychiatric/Behavioral: Negative for agitation and confusion.  All other systems reviewed and are negative.   Physical Exam Updated Vital Signs BP (!) 155/87 (BP Location: Left Wrist)   Pulse (!) 51   Temp 97.8 F (36.6 C) (Oral)   Resp 16   Ht '6\' 2"'$  (1.88 m)   Wt 81.6 kg   SpO2 100%   BMI 23.11 kg/m   Physical Exam Vitals and nursing note reviewed.  Constitutional:      General: He is not in acute distress.    Appearance: He is well-developed. He is not ill-appearing, toxic-appearing or diaphoretic.  HENT:     Head: Normocephalic and atraumatic.     Nose: Congestion and rhinorrhea present.     Mouth/Throat:     Mouth: Mucous membranes are moist.     Pharynx: Posterior oropharyngeal erythema present. No oropharyngeal exudate.  Eyes:     Extraocular Movements: Extraocular movements intact.     Conjunctiva/sclera: Conjunctivae normal.     Pupils: Pupils are equal, round, and reactive to light.  Cardiovascular:     Rate and Rhythm: Normal rate and regular rhythm.     Pulses: Normal pulses.  Pulmonary:     Effort: Pulmonary effort is normal. No respiratory distress.     Breath sounds: Normal breath sounds. No wheezing, rhonchi or rales.  Chest:     Chest wall: No tenderness.  Abdominal:     General: Abdomen is flat.     Palpations: Abdomen is soft.     Tenderness: There is no abdominal tenderness. There is no right CVA tenderness, left CVA tenderness, guarding or rebound.  Musculoskeletal:        General: No tenderness.     Cervical back: Neck supple. No tenderness.     Right lower leg: No edema.     Left lower leg: No edema.  Skin:    General: Skin is warm and dry.     Capillary Refill: Capillary refill takes less than 2 seconds.     Findings: No bruising, erythema or rash.  Neurological:     Mental Status: He is alert. Mental status is  at baseline.     Sensory: No sensory deficit.     Motor: No weakness.  Psychiatric:        Mood and Affect: Mood normal.     ED Results / Procedures / Treatments   Labs (all labs ordered are listed, but only abnormal results are displayed) Labs Reviewed  CBC WITH DIFFERENTIAL/PLATELET - Abnormal; Notable for the following components:      Result Value   MCH 25.5 (*)    RDW 16.5 (*)    All other components within normal limits  COMPREHENSIVE METABOLIC PANEL - Abnormal; Notable for the following components:   Glucose, Bld 115 (*)    BUN 30 (*)  Creatinine, Ser 1.64 (*)    Calcium 8.5 (*)    Albumin 3.1 (*)    GFR, Estimated 45 (*)    All other components within normal limits  RESP PANEL BY RT-PCR (FLU A&B, COVID) ARPGX2  GROUP A STREP BY PCR  URINE CULTURE  URINALYSIS, ROUTINE W REFLEX MICROSCOPIC    EKG None  Radiology DG Chest Portable 1 View  Result Date: 09/04/2020 CLINICAL DATA:  Cough and fatigue. A few days of shortness of breath and productive cough. EXAM: PORTABLE CHEST 1 VIEW COMPARISON:  07/01/2020 FINDINGS: Shallow lung inflation. Heart is probably enlarged accounting for patient position. No focal consolidations or pleural effusions. Minimal bibasilar subsegmental atelectasis. No edema. IMPRESSION: Shallow inflation. Electronically Signed   By: Nolon Nations M.D.   On: 09/04/2020 09:12    Procedures Procedures   Medications Ordered in ED Medications  sodium chloride 0.9 % bolus 1,000 mL (0 mLs Intravenous Stopped 09/04/20 0951)    ED Course  I have reviewed the triage vital signs and the nursing notes.  Pertinent labs & imaging results that were available during my care of the patient were reviewed by me and considered in my medical decision making (see chart for details).    MDM Rules/Calculators/A&P                          DAICHI GRAMZA is a 70 y.o. male with a past medical history significant for prior stroke, frequent falls,  hypertension, hyperlipidemia, lupus, and hypercholesterolemia who presents with shivering, fatigue, cough, diarrhea, and sore throat.  Patient reports that he was placed in a skilled nursing facility last month after having falls and some instability issues after a stroke.  He reports that he did not want to stay longer and ended up leaving around a month ago.  Since then he has been staying outside intermittently and reportedly found on the ground by bystanders today shivering and covered in feces from diarrhea for the last few days.  Patient says that the last 2 days he been staying outside and he feels tired.  He has complained of sore throat and shivering but he denies actual fevers or chills.  He reports has had a cough with some production of phlegm but denies any chest pain or shortness of breath.  He reports no abdominal pain or back pain.  He feels like her stroke symptoms are doing well and he denies any stroke complaints aside from some chronic vision changes.  He reports no nausea or vomiting but reports having watery diarrhea.  Denies any blood in his stools.  He denies any headache, neck pain, neck stiffness, or any discomfort at this time.  On arrival, patient is normotensive and has no evidence of fever or hypothermia.  On exam patient's lungs are clear.  Chest is nontender, abdomen is nontender.  Patient moving all extremities normal sensation and strength.  Symmetric smile.  Clear speech.  Patient has some erythema the back of his oropharynx with some drainage and congestion seen.  Patient will get a strep swab to look for strep throat although I suspect is more related to allergies with the pollen as patient has been staying outside.  We will get some screening labs given his multiple days of diarrhea, staying outside, and his shivering and fatigue.  We will also get a chest x-ray given the productive cough.  Will test for COVID and flu given the ongoing pandemic and new emergence  of influenza  in this area.  We will give some fluids for likely dehydration as he clinically appears dehydrated with with dry mucous membranes and his diarrhea.  If work-up is reassuring and he is feeling better with continued reassuring vital signs, anticipate discharge home.  1:46 PM Laboratory work-up was overall reassuring.  Creatinine is improved from last visit.  Urinalysis shows no infection.  CBC and CMP otherwise similar to prior.  Chest x-ray shows no pneumonia.  COVID and flu test negative.  Strep test negative.  Suspect viral URI causing his sore throat symptoms, cough, and likely his diarrhea as well.  Patient is now appearing more hydrated and his vital signs are more reassuring.  Patient tolerated eating and drinking without difficulty no nausea or vomiting.  Patient is feeling well.  Patient agrees with discharge and follow-up with PCP.  He had no other questions or concerns and was discharged in good condition.   Final Clinical Impression(s) / ED Diagnoses Final diagnoses:  Sore throat  Cough  Viral URI  Diarrhea, unspecified type    Clinical Impression: 1. Sore throat   2. Cough   3. Viral URI   4. Diarrhea, unspecified type     Disposition: Discharge  Condition: Good  I have discussed the results, Dx and Tx plan with the pt(& family if present). He/she/they expressed understanding and agree(s) with the plan. Discharge instructions discussed at great length. Strict return precautions discussed and pt &/or family have verbalized understanding of the instructions. No further questions at time of discharge.    New Prescriptions   No medications on file    Follow Up: Sonia Side., FNP [place] [id number] [phone]     [facility]-EMERGENCY DEPT [place] [id number] [place]       Miyoko Hashimi, Gwenyth Allegra, MD 09/04/20 1421

## 2020-09-04 NOTE — ED Triage Notes (Signed)
Per EMS Pt from [facility] on sidewalk complaining n/v and sore throat. O2 97 144/100 H66 R16 T 97.6

## 2020-09-05 LAB — URINE CULTURE: Culture: NO GROWTH

## 2020-09-27 ENCOUNTER — Emergency Department (HOSPITAL_COMMUNITY)
Admission: EM | Admit: 2020-09-27 | Discharge: 2020-09-27 | Disposition: A | Discharge status: Home / Self Care | Payer: Medicare Other | Attending: Emergency Medicine | Admitting: Emergency Medicine

## 2020-09-27 ENCOUNTER — Emergency Department (HOSPITAL_COMMUNITY): Payer: Medicare Other

## 2020-09-27 ENCOUNTER — Other Ambulatory Visit: Payer: Self-pay

## 2020-09-27 DIAGNOSIS — I10 Essential (primary) hypertension: Secondary | ICD-10-CM | POA: Diagnosis not present

## 2020-09-27 DIAGNOSIS — Z7982 Long term (current) use of aspirin: Secondary | ICD-10-CM | POA: Diagnosis not present

## 2020-09-27 DIAGNOSIS — Z79899 Other long term (current) drug therapy: Secondary | ICD-10-CM | POA: Diagnosis not present

## 2020-09-27 DIAGNOSIS — R55 Syncope and collapse: Secondary | ICD-10-CM | POA: Insufficient documentation

## 2020-09-27 DIAGNOSIS — Z87891 Personal history of nicotine dependence: Secondary | ICD-10-CM | POA: Insufficient documentation

## 2020-09-27 DIAGNOSIS — N1832 Chronic kidney disease, stage 3b: Secondary | ICD-10-CM

## 2020-09-27 DIAGNOSIS — Z8673 Personal history of transient ischemic attack (TIA), and cerebral infarction without residual deficits: Secondary | ICD-10-CM | POA: Insufficient documentation

## 2020-09-27 LAB — COMPREHENSIVE METABOLIC PANEL
ALT: 19 U/L (ref 0–44)
AST: 23 U/L (ref 15–41)
Albumin: 2.9 g/dL — ABNORMAL LOW (ref 3.5–5.0)
Alkaline Phosphatase: 62 U/L (ref 38–126)
Anion gap: 12 (ref 5–15)
BUN: 31 mg/dL — ABNORMAL HIGH (ref 8–23)
CO2: 23 mmol/L (ref 22–32)
Calcium: 8.3 mg/dL — ABNORMAL LOW (ref 8.9–10.3)
Chloride: 104 mmol/L (ref 98–111)
Creatinine, Ser: 1.92 mg/dL — ABNORMAL HIGH (ref 0.61–1.24)
GFR, Estimated: 37 mL/min — ABNORMAL LOW (ref >60)
Glucose, Bld: 75 mg/dL (ref 70–99)
Potassium: 3.9 mmol/L (ref 3.5–5.1)
Sodium: 139 mmol/L (ref 135–145)
Total Bilirubin: 1.3 mg/dL — ABNORMAL HIGH (ref 0.3–1.2)
Total Protein: 6 g/dL — ABNORMAL LOW (ref 6.5–8.1)

## 2020-09-27 LAB — CBC WITH DIFFERENTIAL/PLATELET
Abs Immature Granulocytes: 0.03 10*3/uL (ref 0.00–0.07)
Basophils Absolute: 0 10*3/uL (ref 0.0–0.1)
Basophils Relative: 0 %
Eosinophils Absolute: 0 10*3/uL (ref 0.0–0.5)
Eosinophils Relative: 1 %
HCT: 34.1 % — ABNORMAL LOW (ref 39.0–52.0)
Hemoglobin: 10.2 g/dL — ABNORMAL LOW (ref 13.0–17.0)
Immature Granulocytes: 1 %
Lymphocytes Relative: 12 %
Lymphs Abs: 0.6 10*3/uL — ABNORMAL LOW (ref 0.7–4.0)
MCH: 26 pg (ref 26.0–34.0)
MCHC: 29.9 g/dL — ABNORMAL LOW (ref 30.0–36.0)
MCV: 87 fL (ref 80.0–100.0)
Monocytes Absolute: 0.7 10*3/uL (ref 0.1–1.0)
Monocytes Relative: 14 %
Neutro Abs: 3.4 10*3/uL (ref 1.7–7.7)
Neutrophils Relative %: 72 %
Platelets: 145 10*3/uL — ABNORMAL LOW (ref 150–400)
RBC: 3.92 MIL/uL — ABNORMAL LOW (ref 4.22–5.81)
RDW: 16.8 % — ABNORMAL HIGH (ref 11.5–15.5)
WBC: 4.8 10*3/uL (ref 4.0–10.5)
nRBC: 0 % (ref 0.0–0.2)

## 2020-09-27 LAB — URINALYSIS, ROUTINE W REFLEX MICROSCOPIC
Bilirubin Urine: NEGATIVE
Glucose, UA: NEGATIVE mg/dL
Hgb urine dipstick: NEGATIVE
Ketones, ur: 5 mg/dL — AB
Leukocytes,Ua: NEGATIVE
Nitrite: NEGATIVE
Protein, ur: NEGATIVE mg/dL
Specific Gravity, Urine: 1.012 (ref 1.005–1.030)
pH: 6 (ref 5.0–8.0)

## 2020-09-27 LAB — RAPID URINE DRUG SCREEN, HOSP PERFORMED
Amphetamines: NOT DETECTED
Barbiturates: NOT DETECTED
Benzodiazepines: NOT DETECTED
Cocaine: POSITIVE — AB
Opiates: NOT DETECTED
Tetrahydrocannabinol: NOT DETECTED

## 2020-09-27 LAB — ETHANOL: Alcohol, Ethyl (B): <10 mg/dL (ref <10)

## 2020-09-27 MED ORDER — SODIUM CHLORIDE 0.9 % IV BOLUS
500.0000 mL | Freq: Once | INTRAVENOUS | Status: AC
  Administered 2020-09-27: 500 mL via INTRAVENOUS

## 2020-09-27 NOTE — ED Provider Notes (Signed)
UA negative - Pt has normal VS at this time. Vitals:   09/27/20 1630 09/27/20 1845  BP: (!) 146/80   Pulse: 79 73  Resp: (!) 22 20  Temp:    SpO2: 100% 100%   Pt has normal UA - drug screen positive for Cocaine -  Otherwise ,no syncope here  Stable for d/c at this time.   Noemi Chapel, MD 09/27/20 1929

## 2020-09-27 NOTE — Discharge Instructions (Addendum)
It was our pleasure to provide your ER care today - we hope that you feel better.  Drink plenty of fluids/stay well hydrated, and eat meals regularly.   Follow up with primary care doctor in the next 1-2 weeks.  Also follow up with cardiology in one week - call office to arrange appointment.   Return to ER if worse, new symptoms, fevers, new or severe pain, chest pain, trouble breathing, weak/fainting, or other concern.

## 2020-09-27 NOTE — ED Provider Notes (Addendum)
[facility] EMERGENCY DEPARTMENT Provider Note   CSN: [id number] Arrival date & time: 09/27/20  1240     History Chief Complaint  Patient presents with  . Loss of Consciousness    Isaac Reyes is a 70 y.o. male.  Patient indicates at shelter and had fainting episode. Was noted to have initial bp of 90/60. EMS gave 500 cc ns bolus w improvement in bp. Pt notes recent poor po intake. No vomiting or diarrhea. No recent blood loss, rectal bleeding or melena. Denies abd pain. No dysuria or gu c/o. Denies headache. No neck or back pain. No current or recent chest pain or discomfort. No sob or unusual doe. Denies extremity pain or injury.  No recent change in meds or new meds. No fever or chills.   The history is provided by the patient and the EMS personnel.  Loss of Consciousness Associated symptoms: no chest pain, no confusion, no fever, no headaches, no palpitations, no shortness of breath, no vomiting and no weakness        Past Medical History:  Diagnosis Date  . Acute CVA (cerebrovascular accident) ([place]) 06/2017   "speech issues since" (07/24/2017)  . AKI (acute kidney injury) ([place]) 07/24/2017  . Anemia   . High cholesterol   . Lupus ([place])    "skin kind"  . NSVT (nonsustained ventricular tachycardia) [facility])     Patient Active Problem List   Diagnosis Date Noted  . Syncope and collapse 07/01/2020  . Syncope 11/07/2017  . Benign essential HTN 07/26/2017  . Acute kidney failure ([place]) 07/24/2017  . CVA (cerebral vascular accident) ([place]) 07/19/2017  . 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 NSVT (nonsustained ventricular tachycardia) ([place]) 07/18/2017  . Elevated blood pressure reading 07/18/2017  . Normocytic normochromic anemia 07/18/2017  . Malnutrition of moderate degree 07/18/2017  . Normocytic anemia   . Frequent falls   . Renal insufficiency   . Hyperlipidemia   . Heavy smoker   . Alcohol abuse   . Abnormal EKG 03/19/2013  . Pre-operative  cardiovascular exam, new EKG abnormalities c/w ischemia 03/19/2013  . LVH (left ventricular hypertrophy) 03/19/2013  . Murmur, cardiac 03/19/2013    Past Surgical History:  Procedure Laterality Date  . KNEE ARTHROSCOPY    . TONSILLECTOMY         Family History  Problem Relation Age of Onset  . Heart attack Father     Social History   Tobacco Use  . Smoking status: Former Smoker    Packs/day: 0.12    Years: 45.00    Pack years: 5.40    Types: Cigars, Cigarettes    Quit date: 10/19/2016    Years since quitting: 3.9  . Smokeless tobacco: Never Used  Vaping Use  . Vaping Use: Never used  Substance Use Topics  . Alcohol use: Yes    Alcohol/week: 3.0 standard drinks    Types: 3 Standard drinks or equivalent per week  . Drug use: No    Home Medications Prior to Admission medications   Medication Sig Start Date End Date Taking? Authorizing Provider  acetaminophen (TYLENOL) 500 MG tablet Take 2 tablets (1,000 mg total) by mouth every 6 (six) hours as needed. 07/26/20   Charlesetta Shanks, MD  amLODipine (NORVASC) 10 MG tablet Take 1 tablet (10 mg total) by mouth daily. 07/27/17   [name], MD  ASPIRIN LOW DOSE 81 MG EC tablet Take 81 mg by mouth daily. 09/26/17   [provider]  atorvastatin (LIPITOR)  80 MG tablet Take 80 mg by mouth daily.    [provider]  oxybutynin (DITROPAN-XL) 10 MG 24 hr tablet Take 10 mg by mouth at bedtime. 06/17/19   [provider]  losartan (COZAAR) 100 MG tablet Take 100 mg by mouth daily.  07/26/20  [provider]    Allergies    Patient has no known allergies.  Review of Systems   Review of Systems  Constitutional: Negative for chills and fever.  HENT: Negative for nosebleeds.   Eyes: Negative for pain, redness and visual disturbance.  Respiratory: Negative for cough and shortness of breath.   Cardiovascular: Positive for syncope. Negative for chest pain, palpitations and leg swelling.  Gastrointestinal:  Negative for abdominal pain, blood in stool, diarrhea and vomiting.  Genitourinary: Negative for dysuria and flank pain.  Musculoskeletal: Negative for back pain and neck pain.  Skin: Negative for rash.  Neurological: Negative for weakness, numbness and headaches.  Hematological: Does not bruise/bleed easily.  Psychiatric/Behavioral: Negative for confusion.    Physical Exam Updated Vital Signs BP (!) 142/81   Pulse 76   Temp (!) 97.3 F (36.3 C) (Oral)   Resp 19   SpO2 100%   Physical Exam Vitals and nursing note reviewed.  Constitutional:      Appearance: Normal appearance. He is well-developed.  HENT:     Head: Atraumatic.     Nose: Nose normal.     Mouth/Throat:     Mouth: Mucous membranes are moist.     Pharynx: Oropharynx is clear.  Eyes:     General: No scleral icterus.    Conjunctiva/sclera: Conjunctivae normal.     Pupils: Pupils are equal, round, and reactive to light.  Neck:     Vascular: No carotid bruit.     Trachea: No tracheal deviation.  Cardiovascular:     Rate and Rhythm: Normal rate and regular rhythm.     Pulses: Normal pulses.     Heart sounds: Normal heart sounds. No murmur heard. No friction rub. No gallop.   Pulmonary:     Effort: Pulmonary effort is normal. No accessory muscle usage or respiratory distress.     Breath sounds: Normal breath sounds.  Chest:     Chest wall: No tenderness.  Abdominal:     General: Bowel sounds are normal. There is no distension.     Palpations: Abdomen is soft. There is no mass.     Tenderness: There is no abdominal tenderness. There is no guarding.  Genitourinary:    Comments: No cva tenderness. Light brown stool - tests hemoccult neg.  Musculoskeletal:        General: No swelling or tenderness.     Cervical back: Normal range of motion and neck supple. No rigidity or tenderness.     Comments: CTLS spine, non tender, aligned, no step off. Good rom bil extremities without pain or other focal bony tenderness.    Skin:    General: Skin is warm and dry.     Findings: No rash.  Neurological:     Mental Status: He is alert.     Comments: Alert, speech clear. GCS 15, motor/sens grossly intact bil. Steady gait.   Psychiatric:        Mood and Affect: Mood normal.     ED Results / Procedures / Treatments   Labs (all labs ordered are listed, but only abnormal results are displayed) Results for orders placed or performed during the hospital encounter of 09/27/20  Comprehensive  metabolic panel  Result Value Ref Range   Sodium 139 135 - 145 mmol/L   Potassium 3.9 3.5 - 5.1 mmol/L   Chloride 104 98 - 111 mmol/L   CO2 23 22 - 32 mmol/L   Glucose, Bld 75 70 - 99 mg/dL   BUN 31 (H) 8 - 23 mg/dL   Creatinine, Ser 1.92 (H) 0.61 - 1.24 mg/dL   Calcium 8.3 (L) 8.9 - 10.3 mg/dL   Total Protein 6.0 (L) 6.5 - 8.1 g/dL   Albumin 2.9 (L) 3.5 - 5.0 g/dL   AST 23 15 - 41 U/L   ALT 19 0 - 44 U/L   Alkaline Phosphatase 62 38 - 126 U/L   Total Bilirubin 1.3 (H) 0.3 - 1.2 mg/dL   GFR, Estimated 37 (L) >60 mL/min   Anion gap 12 5 - 15  Ethanol  Result Value Ref Range   Alcohol, Ethyl (B) <10 <10 mg/dL  CBC with Differential  Result Value Ref Range   WBC 4.8 4.0 - 10.5 K/uL   RBC 3.92 (L) 4.22 - 5.81 MIL/uL   Hemoglobin 10.2 (L) 13.0 - 17.0 g/dL   HCT 34.1 (L) 39.0 - 52.0 %   MCV 87.0 80.0 - 100.0 fL   MCH 26.0 26.0 - 34.0 pg   MCHC 29.9 (L) 30.0 - 36.0 g/dL   RDW 16.8 (H) 11.5 - 15.5 %   Platelets 145 (L) 150 - 400 K/uL   nRBC 0.0 0.0 - 0.2 %   Neutrophils Relative % 72 %   Neutro Abs 3.4 1.7 - 7.7 K/uL   Lymphocytes Relative 12 %   Lymphs Abs 0.6 (L) 0.7 - 4.0 K/uL   Monocytes Relative 14 %   Monocytes Absolute 0.7 0.1 - 1.0 K/uL   Eosinophils Relative 1 %   Eosinophils Absolute 0.0 0.0 - 0.5 K/uL   Basophils Relative 0 %   Basophils Absolute 0.0 0.0 - 0.1 K/uL   Immature Granulocytes 1 %   Abs Immature Granulocytes 0.03 0.00 - 0.07 K/uL    Stool tests hemoccult negative.     DG Chest  Port 1 View  Result Date: 09/27/2020 CLINICAL DATA:  Loss of consciousness.  Shortness of breath. EXAM: PORTABLE CHEST 1 VIEW COMPARISON:  Single-view of the chest 09/04/2020 and 11/09/2017. FINDINGS: Lung volumes are low but the lungs are clear. Heart size is normal. No pneumothorax or pleural effusion. No acute or focal bony abnormality. IMPRESSION: No acute disease. Electronically Signed   By: Inge Rise M.D.   On: 09/27/2020 13:28   DG Chest Portable 1 View  Result Date: 09/04/2020 CLINICAL DATA:  Cough and fatigue. A few days of shortness of breath and productive cough. EXAM: PORTABLE CHEST 1 VIEW COMPARISON:  07/01/2020 FINDINGS: Shallow lung inflation. Heart is probably enlarged accounting for patient position. No focal consolidations or pleural effusions. Minimal bibasilar subsegmental atelectasis. No edema. IMPRESSION: Shallow inflation. Electronically Signed   By: Nolon Nations M.D.   On: 09/04/2020 09:12    EKG EKG Interpretation  Date/Time:  Tuesday Sep 27 2020 14:29:47 EDT Ventricular Rate:  91 PR Interval:    QRS Duration: 87 QT Interval:  409 QTC Calculation: 504 R Axis:   -4 Text Interpretation: Sinus rhythm Nonspecific T wave abnormality Confirmed by Lajean Saver [id number]) on 09/27/2020 2:33:40 PM   Radiology DG Chest Port 1 View  Result Date: 09/27/2020 CLINICAL DATA:  Loss of consciousness.  Shortness of breath. EXAM: PORTABLE CHEST 1 VIEW COMPARISON:  Single-view of  the chest 09/04/2020 and 11/09/2017. FINDINGS: Lung volumes are low but the lungs are clear. Heart size is normal. No pneumothorax or pleural effusion. No acute or focal bony abnormality. IMPRESSION: No acute disease. Electronically Signed   By: Inge Rise M.D.   On: 09/27/2020 13:28    Procedures Procedures   Medications Ordered in ED Medications  sodium chloride 0.9 % bolus 500 mL (0 mLs Intravenous Stopped 09/27/20 1449)  sodium chloride 0.9 % bolus 500 mL (500 mLs Intravenous New  Bag/Given 09/27/20 1454)    ED Course  I have reviewed the triage vital signs and the nursing notes.  Pertinent labs & imaging results that were available during my care of the patient were reviewed by me and considered in my medical decision making (see chart for details).    MDM Rules/Calculators/A&P                          Continuous pulse ox and cardiac monitoring. Labs. Ecg. Imaging.   Reviewed nursing notes and prior charts for additional history.   Labs reviewed/interpreted by me - wbc normal. Na/k normal. Stool is light brown - hem neg.   Xray reviewed/interpreted by me - no pna.   Po fluids, food. Ambulate in hall.   Ambulates in hall - steady gait.   1530 ua pending, receiving ivf. Signed out to Dr Sabra Heck, if UA ok and continued improved, anticipate d/c to home with outpatient follow up.    Final Clinical Impression(s) / ED Diagnoses Final diagnoses:  None    Rx / DC Orders ED Discharge Orders    None           Lajean Saver, MD 09/27/20 1554

## 2020-09-27 NOTE — ED Triage Notes (Signed)
Pt BIBA from home after a witnessed syncopal episode. Pt was found to have an inital BP of 90/60. Pt rec'd 532m of NS with improvement to BP. Pt denies chest pain.

## 2020-12-07 ENCOUNTER — Emergency Department (HOSPITAL_COMMUNITY)
Admission: EM | Admit: 2020-12-07 | Discharge: 2020-12-07 | Disposition: A | Discharge status: Home / Self Care | Payer: Medicare Other | Attending: Emergency Medicine | Admitting: Emergency Medicine

## 2020-12-07 ENCOUNTER — Encounter (HOSPITAL_COMMUNITY): Payer: Self-pay | Admitting: Emergency Medicine

## 2020-12-07 ENCOUNTER — Emergency Department (HOSPITAL_COMMUNITY): Payer: Medicare Other

## 2020-12-07 DIAGNOSIS — I10 Essential (primary) hypertension: Secondary | ICD-10-CM | POA: Diagnosis not present

## 2020-12-07 DIAGNOSIS — Z7982 Long term (current) use of aspirin: Secondary | ICD-10-CM | POA: Insufficient documentation

## 2020-12-07 DIAGNOSIS — Z79899 Other long term (current) drug therapy: Secondary | ICD-10-CM | POA: Diagnosis not present

## 2020-12-07 DIAGNOSIS — Z87891 Personal history of nicotine dependence: Secondary | ICD-10-CM | POA: Diagnosis not present

## 2020-12-07 DIAGNOSIS — S8391XA Sprain of unspecified site of right knee, initial encounter: Secondary | ICD-10-CM | POA: Insufficient documentation

## 2020-12-07 DIAGNOSIS — S8991XA Unspecified injury of right lower leg, initial encounter: Secondary | ICD-10-CM | POA: Diagnosis present

## 2020-12-07 DIAGNOSIS — W1830XA Fall on same level, unspecified, initial encounter: Secondary | ICD-10-CM | POA: Insufficient documentation

## 2020-12-07 NOTE — ED Triage Notes (Addendum)
Patient complains of right knee pain after a fall yesterday, states he is unable to straighten the leg now. Patient alert, oriented, and in no apparent distress at this time.

## 2020-12-07 NOTE — ED Notes (Signed)
Pt has trace swelling to right knee. Pt was able to transfer self from wheelchair to bed w/cane. Pt has 2+ right pedal pulse, cap refill less than sec, extremity warm to touch.

## 2020-12-07 NOTE — Discharge Instructions (Addendum)
Take over-the-counter medications as needed for pain.  Use the knee sleeve for comfort.  Follow-up with primary care doctor, orthopedic doctor or sports medicine doctor if the symptoms have not resolved over the next week

## 2020-12-07 NOTE — Progress Notes (Deleted)
Orthopedic Tech Progress Note Patient Details:  Isaac Reyes [facility] [birth year] [id number] Patient refused knee brace application stated he could not walk and it would not benefit him  Ortho Devices Type of Ortho Device: Knee Sleeve Ortho Device/Splint Interventions: Ordered   Post Interventions Patient Tolerated: Refused intervention  Linus Salmons [place] 12/07/2020, 9:25 AM

## 2020-12-07 NOTE — Progress Notes (Signed)
Orthopedic Tech Progress Note Patient Details:  Isaac Reyes [facility] 05-18-1951 [id number]  Ortho Devices Type of Ortho Device: Knee Sleeve Ortho Device/Splint Location: Right Knee Ortho Device/Splint Interventions: Application   Post Interventions Patient Tolerated: Well  Derrel Moore E Bettejane Leavens 12/07/2020, 10:06 AM

## 2020-12-07 NOTE — ED Provider Notes (Signed)
[facility] EMERGENCY DEPARTMENT Provider Note   CSN: [id number] Arrival date & time: 12/07/20  [id number]     History Chief Complaint  Patient presents with   Isaac Reyes is a 70 y.o. male.  The history is provided by the patient.  Fall This is a new problem. The current episode started yesterday. The problem occurs constantly. The problem has not changed since onset. Patient states he injured his knee yesterday.  He fell and was having difficulty straightening his leg this morning.  He denies any other injuries.  Patient states the pain is primarily in the knee and increases with movement.  No fevers or chills.  No focal numbness or weakness    Past Medical History:  Diagnosis Date   Acute CVA (cerebrovascular accident) ([place]) 06/2017   "speech issues since" (07/24/2017)   AKI (acute kidney injury) ([place]) 07/24/2017   Anemia    High cholesterol    Lupus ([place])    "skin kind"   NSVT (nonsustained ventricular tachycardia) ([place])     Patient Active Problem List   Diagnosis Date Noted   Syncope and collapse 07/01/2020   Syncope 11/07/2017   Benign essential HTN 07/26/2017   Acute kidney failure ([place]) 07/24/2017   CVA (cerebral vascular accident) ([place]) 07/19/2017   Stroke (cerebrum) ([place]) 07/18/2017   NSVT (nonsustained ventricular tachycardia) ([place]) 07/18/2017   Elevated blood pressure reading 07/18/2017   Normocytic normochromic anemia 07/18/2017   Malnutrition of moderate degree 07/18/2017   Normocytic anemia    Frequent falls    Renal insufficiency    Hyperlipidemia    Heavy smoker    Alcohol abuse    Abnormal EKG 03/19/2013   Pre-operative cardiovascular exam, new EKG abnormalities c/w ischemia 03/19/2013   LVH (left ventricular hypertrophy) 03/19/2013   Murmur, cardiac 03/19/2013    Past Surgical History:  Procedure Laterality Date   KNEE ARTHROSCOPY     TONSILLECTOMY         Family History  Problem Relation Age of Onset    Heart attack Father     Social History   Tobacco Use   Smoking status: Former    Packs/day: 0.12    Years: 45.00    Pack years: 5.40    Types: Cigars, Cigarettes    Quit date: 10/19/2016    Years since quitting: 4.1   Smokeless tobacco: Never  Vaping Use   Vaping Use: Never used  Substance Use Topics   Alcohol use: Yes    Alcohol/week: 3.0 standard drinks    Types: 3 Standard drinks or equivalent per week   Drug use: No    Home Medications Prior to Admission medications   Medication Sig Start Date End Date Taking? Authorizing Provider  acetaminophen (TYLENOL) 500 MG tablet Take 2 tablets (1,000 mg total) by mouth every 6 (six) hours as needed. 07/26/20   Charlesetta Shanks, MD  amLODipine (NORVASC) 10 MG tablet Take 1 tablet (10 mg total) by mouth daily. 07/27/17   [name], MD  ASPIRIN LOW DOSE 81 MG EC tablet Take 81 mg by mouth daily. 09/26/17   [provider]  atorvastatin (LIPITOR) 80 MG tablet Take 80 mg by mouth daily.    [provider]  oxybutynin (DITROPAN-XL) 10 MG 24 hr tablet Take 10 mg by mouth at bedtime. 06/17/19   [provider]  losartan (COZAAR) 100 MG tablet Take 100 mg by mouth daily.  07/26/20  [provider]  Allergies    Patient has no known allergies.  Review of Systems   Review of Systems  All other systems reviewed and are negative.  Physical Exam Updated Vital Signs BP (!) 146/90 (BP Location: Left Arm)   Pulse 63   Temp 98.1 F (36.7 C) (Oral)   Resp 18   SpO2 98%   Physical Exam Vitals and nursing note reviewed.  Constitutional:      General: He is not in acute distress.    Appearance: He is well-developed.  HENT:     Head: Normocephalic and atraumatic.     Right Ear: External ear normal.     Left Ear: External ear normal.  Eyes:     General: No scleral icterus.       Right eye: No discharge.        Left eye: No discharge.     Conjunctiva/sclera: Conjunctivae normal.  Neck:     Trachea: No  tracheal deviation.  Cardiovascular:     Rate and Rhythm: Normal rate.  Pulmonary:     Effort: Pulmonary effort is normal. No respiratory distress.     Breath sounds: No stridor.  Abdominal:     General: There is no distension.  Musculoskeletal:        General: No swelling or deformity.     Cervical back: Neck supple.  Skin:    General: Skin is warm and dry.     Findings: No rash.  Neurological:     Mental Status: He is alert.     Cranial Nerves: Cranial nerve deficit: no gross deficits.    ED Results / Procedures / Treatments   Labs (all labs ordered are listed, but only abnormal results are displayed) Labs Reviewed - No data to display  EKG None  Radiology DG Knee Complete 4 Views Right  Result Date: 12/07/2020 CLINICAL DATA:  Fall. EXAM: RIGHT KNEE - COMPLETE 4+ VIEW COMPARISON:  No recent. FINDINGS: No evidence of fracture, dislocation, or joint effusion. No evidence of arthropathy or other focal bone abnormality. Soft tissues are unremarkable. IMPRESSION: No acute abnormality. Electronically Signed   By: Marcello Moores  Register   On: 12/07/2020 08:53    Procedures Procedures   Medications Ordered in ED Medications - No data to display  ED Course  I have reviewed the triage vital signs and the nursing notes.  Pertinent labs & imaging results that were available during my care of the patient were reviewed by me and considered in my medical decision making (see chart for details).    MDM Rules/Calculators/A&P                           Patient's x-ray does not show any signs of fracture or dislocation.  Patient is able to extend his leg.  No signs of patellar or quadricep tendon disruption.  No significant effusion to suggest occult fracture or infection.  Suspect symptoms were consistent with a sprain.  Will provide knee sleeve.  Over-the-counter medications.  Outpatient follow-up if symptoms do not resolve Final Clinical Impression(s) / ED Diagnoses Final diagnoses:   Sprain of right knee, unspecified ligament, initial encounter    Rx / DC Orders ED Discharge Orders     None        Dorie Rank, MD 12/07/20 [phone]

## 2020-12-22 ENCOUNTER — Other Ambulatory Visit: Payer: Self-pay

## 2020-12-22 ENCOUNTER — Ambulatory Visit
Admission: RE | Admit: 2020-12-22 | Discharge: 2020-12-22 | Disposition: A | Discharge status: Home / Self Care | Payer: Medicare Other | Source: Ambulatory Visit | Attending: Family | Admitting: Family

## 2020-12-22 ENCOUNTER — Other Ambulatory Visit: Payer: Self-pay | Admitting: Family

## 2020-12-22 DIAGNOSIS — R52 Pain, unspecified: Secondary | ICD-10-CM

## 2021-04-21 ENCOUNTER — Emergency Department (HOSPITAL_COMMUNITY): Payer: Medicare Other

## 2021-04-21 ENCOUNTER — Emergency Department (HOSPITAL_COMMUNITY)
Admission: EM | Admit: 2021-04-21 | Discharge: 2021-04-21 | Disposition: A | Discharge status: Home / Self Care | Payer: Medicare Other | Attending: Emergency Medicine | Admitting: Emergency Medicine

## 2021-04-21 DIAGNOSIS — Z7982 Long term (current) use of aspirin: Secondary | ICD-10-CM | POA: Diagnosis not present

## 2021-04-21 DIAGNOSIS — R55 Syncope and collapse: Secondary | ICD-10-CM | POA: Diagnosis present

## 2021-04-21 DIAGNOSIS — Z87891 Personal history of nicotine dependence: Secondary | ICD-10-CM | POA: Diagnosis not present

## 2021-04-21 DIAGNOSIS — I10 Essential (primary) hypertension: Secondary | ICD-10-CM | POA: Insufficient documentation

## 2021-04-21 DIAGNOSIS — Z20822 Contact with and (suspected) exposure to covid-19: Secondary | ICD-10-CM | POA: Diagnosis not present

## 2021-04-21 DIAGNOSIS — Z79899 Other long term (current) drug therapy: Secondary | ICD-10-CM | POA: Insufficient documentation

## 2021-04-21 LAB — CBC
HCT: 48.3 % (ref 39.0–52.0)
Hemoglobin: 14.6 g/dL (ref 13.0–17.0)
MCH: 25.3 pg — ABNORMAL LOW (ref 26.0–34.0)
MCHC: 30.2 g/dL (ref 30.0–36.0)
MCV: 83.9 fL (ref 80.0–100.0)
Platelets: 202 10*3/uL (ref 150–400)
RBC: 5.76 MIL/uL (ref 4.22–5.81)
RDW: 16.1 % — ABNORMAL HIGH (ref 11.5–15.5)
WBC: 6.6 10*3/uL (ref 4.0–10.5)
nRBC: 0 % (ref 0.0–0.2)

## 2021-04-21 LAB — DIFFERENTIAL
Abs Immature Granulocytes: 0.02 10*3/uL (ref 0.00–0.07)
Basophils Absolute: 0 10*3/uL (ref 0.0–0.1)
Basophils Relative: 1 %
Eosinophils Absolute: 0.1 10*3/uL (ref 0.0–0.5)
Eosinophils Relative: 1 %
Immature Granulocytes: 0 %
Lymphocytes Relative: 14 %
Lymphs Abs: 0.9 10*3/uL (ref 0.7–4.0)
Monocytes Absolute: 0.4 10*3/uL (ref 0.1–1.0)
Monocytes Relative: 7 %
Neutro Abs: 5.1 10*3/uL (ref 1.7–7.7)
Neutrophils Relative %: 77 %

## 2021-04-21 LAB — COMPREHENSIVE METABOLIC PANEL
ALT: 26 U/L (ref 0–44)
AST: 27 U/L (ref 15–41)
Albumin: 3.6 g/dL (ref 3.5–5.0)
Alkaline Phosphatase: 116 U/L (ref 38–126)
Anion gap: 10 (ref 5–15)
BUN: 21 mg/dL (ref 8–23)
CO2: 26 mmol/L (ref 22–32)
Calcium: 8.9 mg/dL (ref 8.9–10.3)
Chloride: 101 mmol/L (ref 98–111)
Creatinine, Ser: 2 mg/dL — ABNORMAL HIGH (ref 0.61–1.24)
GFR, Estimated: 35 mL/min — ABNORMAL LOW (ref >60)
Glucose, Bld: 154 mg/dL — ABNORMAL HIGH (ref 70–99)
Potassium: 3.6 mmol/L (ref 3.5–5.1)
Sodium: 137 mmol/L (ref 135–145)
Total Bilirubin: 1.3 mg/dL — ABNORMAL HIGH (ref 0.3–1.2)
Total Protein: 7.7 g/dL (ref 6.5–8.1)

## 2021-04-21 LAB — APTT: aPTT: 29 seconds (ref 24–36)

## 2021-04-21 LAB — I-STAT CHEM 8, ED
BUN: 24 mg/dL — ABNORMAL HIGH (ref 8–23)
Calcium, Ion: 1.12 mmol/L — ABNORMAL LOW (ref 1.15–1.40)
Chloride: 102 mmol/L (ref 98–111)
Creatinine, Ser: 2.1 mg/dL — ABNORMAL HIGH (ref 0.61–1.24)
Glucose, Bld: 147 mg/dL — ABNORMAL HIGH (ref 70–99)
HCT: 49 % (ref 39.0–52.0)
Hemoglobin: 16.7 g/dL (ref 13.0–17.0)
Potassium: 3.6 mmol/L (ref 3.5–5.1)
Sodium: 139 mmol/L (ref 135–145)
TCO2: 27 mmol/L (ref 22–32)

## 2021-04-21 LAB — PROTIME-INR
INR: 1.1 (ref 0.8–1.2)
Prothrombin Time: 14.2 seconds (ref 11.4–15.2)

## 2021-04-21 LAB — RESP PANEL BY RT-PCR (FLU A&B, COVID) ARPGX2
Influenza A by PCR: NEGATIVE
Influenza B by PCR: NEGATIVE
SARS Coronavirus 2 by RT PCR: NEGATIVE

## 2021-04-21 LAB — ETHANOL: Alcohol, Ethyl (B): <10 mg/dL (ref <10)

## 2021-04-21 NOTE — ED Triage Notes (Signed)
Patient BIB [facility] from a [employer] after 5 minute syncopal episode witnessed by bystanders at [employer]. Alert and oriented at this time.

## 2021-04-21 NOTE — Consult Note (Signed)
NEURO HOSPITALIST CONSULT NOTE   Requestig physician: Dr. Tyrone Nine  Reason for Consult: Loss of consciousness at [employer]  History obtained from:   Patient, EMS and Chart     HPI:                                                                                                                                          Isaac Reyes is an 70 y.o. male with a history of stroke in 2019 with residual mild right upper and lower extremity weakness as well as dysarthria, hypercholesterolemia, anemia, lupus and NSVT, who was brought to the ED by EMS from a [employer] after a 5 minute syncopal episode witnessed by bystanders at the restaurant. LKN was 1130. On arrival to the ED he was alert and oriented. The patient stated to staff that he thought he had a seizure. There was no reported loss of bowel or bladder continence, biting of the lip or tongue or shaking. Also with no prodromal symptoms. Patient did appear drowsy on EDP exam. Code Stroke was then activated in Triage for right sided facial droop.   EKG:  Sinus rhythm Atrial premature complex Left ventricular hypertrophy Probable anterior infarct, age indeterminate Abnormal T, consider ischemia, inferior leads Prolonged QT interval No significant change since last tracing  Past Medical History:  Diagnosis Date   Acute CVA (cerebrovascular accident) ([place]) 06/2017   "speech issues since" (07/24/2017)   AKI (acute kidney injury) ([place]) 07/24/2017   Anemia    High cholesterol    Lupus ([place])    "skin kind"   NSVT (nonsustained ventricular tachycardia) ([facility])     Past Surgical History:  Procedure Laterality Date   KNEE ARTHROSCOPY     TONSILLECTOMY      Family History  Problem Relation Age of Onset   Heart attack Father              Social History:  reports that he quit smoking about 4 years ago. His smoking use included cigars and cigarettes. He has a 5.40 pack-year smoking history. He has never used smokeless tobacco. He  reports current alcohol use of about 3.0 standard drinks per week. He reports that he does not use drugs.  No Known Allergies  HOME MEDICATIONS:  No current facility-administered medications on file prior to encounter.   Current Outpatient Medications on File Prior to Encounter  Medication Sig Dispense Refill   amLODipine (NORVASC) 10 MG tablet Take 1 tablet (10 mg total) by mouth daily. (Patient taking differently: Take 10 mg by mouth every morning.) 30 tablet 0   aspirin EC 81 MG tablet Take 81 mg by mouth every morning. Swallow whole.     atorvastatin (LIPITOR) 80 MG tablet Take 80 mg by mouth at bedtime.     losartan (COZAAR) 100 MG tablet Take 100 mg by mouth every morning.     Multiple Vitamin (MULTIVITAMIN WITH MINERALS) TABS tablet Take 1 tablet by mouth every morning.     tamsulosin (FLOMAX) 0.4 MG CAPS capsule Take 0.4 mg by mouth every morning.     acetaminophen (TYLENOL) 500 MG tablet Take 2 tablets (1,000 mg total) by mouth every 6 (six) hours as needed. (Patient not taking: Reported on 04/21/2021) 60 tablet 0    ROS:                                                                                                                                       As per HPI. The patient endorses chronic deficit of mild right sided weakness from prior stroke. Denies any seizure, trouble speaking, confusion or dizziness. Other ROS as per HPI.    Blood pressure 134/81, pulse 68, temperature 98.2 F (36.8 C), resp. rate (!) 22, weight 97.4 kg, SpO2 95 %.   General Examination:                                                                                                       Physical Exam  HEENT-  [place]/AT   Lungs- Respirations unlabored Extremities- Warm and well perfused   Neurological Examination Mental Status: Alert and oriented. Affect is somewhat flattened. Speech is  sparse but fluent with no definite dysarthria.  Naming intact. Able to follow all commands.  Cranial Nerves: II: Temporal visual fields intact with no extinction to DSS. PERRL.  III,IV, VI: No ptosis. EOMI with saccadic visual pursuits noted. No nystagmus.  V: Facial temp sensation equal bilaterally VII: Smile symmetric VIII: Hearing intact to voice IX,X: Mildly hypophonic speech XI: Head is midline XII: midline tongue extension Motor: LUE and LLE 5/5 RUE and RLE with subtle 4+/5 weakness Sensory: Temp and light touch intact throughout, bilaterally. No extinction to DSS.  Deep Tendon Reflexes: 2+ and symmetric throughout Cerebellar: No ataxia  with FNF bilaterally  Gait: Somewhat unsteady without wide base or gait ataxia.    Lab Results: Basic Metabolic Panel: Recent Labs  Lab 04/21/21 1320  NA 139  K 3.6  CL 102  GLUCOSE 147*  BUN 24*  CREATININE 2.10*    CBC: Recent Labs  Lab 04/21/21 1320  HGB 16.7  HCT 49.0    Cardiac Enzymes: No results for input(s): CKTOTAL, CKMB, CKMBINDEX, TROPONINI in the last 168 hours.  Lipid Panel: No results for input(s): CHOL, TRIG, HDL, CHOLHDL, VLDL, LDLCALC in the last 168 hours.  Imaging: CT HEAD CODE STROKE WO CONTRAST  Result Date: 04/21/2021 CLINICAL DATA:  Code stroke.  Neuro deficit, acute, stroke suspected EXAM: CT HEAD WITHOUT CONTRAST TECHNIQUE: Contiguous axial images were obtained from the base of the skull through the vertex without intravenous contrast. COMPARISON:  February 2022 FINDINGS: Brain: There is no acute intracranial hemorrhage, mass effect, or edema. No definite new loss of gray-white differentiation. Confluent areas of hypoattenuation in the supratentorial and pontine white matter are nonspecific but probably reflects stable advanced chronic microvascular ischemic changes. There are chronic small vessel infarcts of the central white matter and deep gray nuclei. No extra-axial collection. Ventricles and sulci are  stable in size and configuration. Vascular: No hyperdense vessel. Skull: Unremarkable. Sinuses/Orbits: Lobular mucosal thickening left maxillary sinus. Orbits are unremarkable. Other: Mastoid air cells are clear. ASPECTS ([place] Stroke Program Early CT Score) - Ganglionic level infarction (caudate, lentiform nuclei, internal capsule, insula, M1-M3 cortex): 7 - Supraganglionic infarction (M4-M6 cortex): 3 Total score (0-10 with 10 being normal): 10 IMPRESSION: There is no acute intracranial hemorrhage or evidence of acute infarction. ASPECT score is 10. Advanced chronic microvascular ischemic changes with chronic small vessel infarcts. Difficult to exclude a new small vessel infarct. Initial results were communicated to Dr. Cheral Marker at 1:19 pm on 04/21/2021 by text page via the [facility] messaging system. Electronically Signed   By: Macy Mis M.D.   On: 04/21/2021 13:20     Assessment: 70 year old male with a history of stroke with residual mild right sided weakness and unsteady gait, presenting after losing consciousness while sitting down and eating a hamburger at [employer]. The patient now states that he feels back to baseline.  1. Exam reveals mild right sided weakness relative to the left, which patient states is chronic.  2. CT head: There is no acute intracranial hemorrhage or evidence of acute infarction. ASPECT score is 10. Advanced chronic microvascular ischemic changes with chronic small vessel infarcts.  3. Most likely etiology for the patient's presentation is a syncopal event, possibly vasovagal versus secondary to hypoglycemia versus cardiogenic syncope.  4.  Per chart review the patient was previously evaluated for a similar event. His previous had a work-up was largely unremarkable.  He had been sent for cardiology follow-up for possible loop recorder placement however has not been performed as of yet.  Recommendations: 1. Outpatient Cardiology follow up 2. Basic laboratory work  up  Electronically signed: Dr. Kerney Elbe 04/21/2021, 1:37 PM

## 2021-04-21 NOTE — ED Provider Notes (Signed)
  Physical Exam  BP 129/81   Pulse 79   Temp (!) 97.4 F (36.3 C) (Oral)   Resp 20   Wt 97.4 kg   SpO2 99%   BMI 27.57 kg/m   Physical Exam  ED Course/Procedures     Procedures  MDM  Assuming care of patient from Dr. Tyrone Nine Patient had come into the ER with chief complaint of fainting.  While at triage, code stroke was activated.  Neurology team has seen the patient, code stroke was inactivated.  Workup thus far shows : Normal CT head.  Labs are at baseline normal.  QT was mildly prolonged.  Syncopal episode occurred while patient was eating.  No prodromal symptoms.  Concerning findings are as following : None Important pending results are recommendations from neurology.  According to Dr. Tyrone Nine, plan is to discharge the patient if Neuro clears.  Patient had no complains, no concerns from the nursing side. Will continue to monitor.  Reassessment at 5: I looked up patient's records.  He had a syncopal episode earlier in the year.  At that time, he was admitted and requested to follow-up with cardiology for loop recorder.  Patient informed me that he never followed up.  He had another syncopal episode 3 months back.  Patient wants to leave.  Neuro Recs are still pending.   Reassessment at 7: Still pending recommendations from neuro. In the interim, I did page cardiology service.  Spoke with Dr. Harl Bowie, they will contact the patient for an appointment for loop recorder  Patient advised to return to the ER if he has fainting spell again.  Otherwise, telemetry monitoring in the ER has been reassuring, no arrhythmias.  Patient wants to leave.  He is hungry, and wants to be in his home before it is too late.  All the recommendations from neuro is still pending.  He no point had any focal neurodeficits and continues to have no headaches, neck pain, dizziness.  No nystagmus.  I do not think this was a primary neuro issue.    Patient wants to leave against medical advice. Patient  understands that his actions will lead to inadequate medical workup, and that he/she is at risk of complications of missed diagnosis, which includes morbidity and mortality.  Opportunity to change mind given. Patient is demonstrating good capacity to make decision. Patient understands that he needs to return to the ER immediately if his symptoms get worse.       Varney Biles, MD 04/21/21 [phone]

## 2021-04-21 NOTE — Discharge Instructions (Addendum)
You were seen in the ER after you had a fainting spell.  It is prudent that you follow-up with the cardiologist. [place] cardiology team has informed us that they will call you for an appointment on Monday.  If you do not hear from them by 2 or 3 PM in the afternoon, please call the number provided to ensure that you are seen by the cardiology clinic.  Refrain from any drug use.  Refrain from any heavy exertion. Return to the ER immediately if you have another fainting spell.   law prevents people with seizures or fainting from driving or operating dangerous machinery until they are free of seizures or fainting for 6 months.

## 2021-04-21 NOTE — Code Documentation (Signed)
Stroke Response Nurse Documentation Code Documentation  CAYCE QUEZADA is a 71 y.o. male arriving to [facility] ED via [facility] on 04/21/21. Code stroke was activated by ED Triage RN.   Patient from [place] where he was LKW at 1130 eating when a bystander noticed he may be having a syncopal episode and called EMS. In triage, RN noticed the patient had a right sided facial droop and called code stroke. Code stroke later cancelled by Horatio Pel, MD.  Care/Plan: Patient returned to ED room with RN with no significant event.  Bedside handoff with ED RN Abe People.  Meda Klinefelter  Stroke Response RN

## 2021-04-21 NOTE — ED Provider Notes (Signed)
Emergency Medicine Provider Triage Evaluation Note  Isaac Reyes , a 70 y.o. male  was evaluated in triage.  Pt complains of seizure.  Patient was brought in by EMS from [facility] after reported syncopal episode that was witnessed by bystanders.  Patient states that he had a seizure.  There was no reported loss of bowel or bladder, biting of the lip or tongue or shaking.  Patient does appear drowsy on my exam.  He does have a history of stroke in 2019 with dysarthria.  He is alert and oriented.  He denies headache, lightheadedness or dizziness  Review of Systems  Positive: See above Negative:   Physical Exam  BP (!) 100/59 (BP Location: Right Arm)   Pulse 62   Temp 98.2 F (36.8 C)   Resp 14   SpO2 97%  Gen:   Drowsy, no distress Resp:   MSK:     Other:  Patient states that he has no residuals.  On exam there is right-sided facial droop, dysarthric speech.  He does have right-sided pronator drift.  Medical Decision Making  Medically screening exam initiated at 12:44 PM.  Appropriate orders placed.  Isaac Reyes was informed that the remainder of the evaluation will be completed by another provider, this initial triage assessment does not replace that evaluation, and the importance of remaining in the ED until their evaluation is complete.  Isaac Atlas, DO, made immediately aware of patient and came to evaluate him at bedside.  Will call code stroke.  Triage RN made aware.   Mickie Hillier, PA-C 04/21/21 Isaac Reyes, Isaac Critchley, DO 04/21/21 1645

## 2021-04-21 NOTE — ED Provider Notes (Signed)
[facility] EMERGENCY DEPARTMENT Provider Note   CSN: [id number] Arrival date & time: 04/21/21  1134     History Chief Complaint  Patient presents with   Loss of Consciousness    Isaac Reyes is a 70 y.o. male.  70 yo M with a chief complaints of a syncopal event.  This unfortunately does happen fairly frequently to the patient has been to the hospital multiple times.  Has been admitted towards the beginning of this year and had an echocardiogram with preserved ejection fraction.  He denies chest pain shortness of breath headache neck pain.  Tells me that he was eating at [facility] and woke up after collapsing.  He denies any prodrome.  He was seen in the MSE process in triage and there was some concern for dysarthria and so code stroke was initiated.  The patient feels like he is back to his baseline now.  The history is provided by the patient.  Loss of Consciousness Episode history:  Single Most recent episode:  Today Duration:  2 minutes Timing:  Rare Progression:  Resolved Chronicity:  New Context comment:  Eating Witnessed: yes   Relieved by:  Lying down Worsened by:  Nothing Ineffective treatments:  None tried Associated symptoms: no chest pain, no confusion, no fever, no headaches, no palpitations, no shortness of breath and no vomiting       Past Medical History:  Diagnosis Date   Acute CVA (cerebrovascular accident) ([place]) 06/2017   "speech issues since" (07/24/2017)   AKI (acute kidney injury) ([place]) 07/24/2017   Anemia    High cholesterol    Lupus ([place])    "skin kind"   NSVT (nonsustained ventricular tachycardia) ([place])     Patient Active Problem List   Diagnosis Date Noted   Syncope and collapse 07/01/2020   Syncope 11/07/2017   Benign essential HTN 07/26/2017   Acute kidney failure ([place]) 07/24/2017   CVA (cerebral vascular accident) ([place]) 07/19/2017   Stroke (cerebrum) ([place]) 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   NSVT (nonsustained ventricular  tachycardia) 07/18/2017   Elevated blood pressure reading 07/18/2017   Normocytic normochromic anemia 07/18/2017   Malnutrition of moderate degree 07/18/2017   Normocytic anemia    Frequent falls    Renal insufficiency    Hyperlipidemia    Heavy smoker    Alcohol abuse    Abnormal EKG 03/19/2013   Pre-operative cardiovascular exam, new EKG abnormalities c/w ischemia 03/19/2013   LVH (left ventricular hypertrophy) 03/19/2013   Murmur, cardiac 03/19/2013    Past Surgical History:  Procedure Laterality Date   KNEE ARTHROSCOPY     TONSILLECTOMY         Family History  Problem Relation Age of Onset   Heart attack Father     Social History   Tobacco Use   Smoking status: Former    Packs/day: 0.12    Years: 45.00    Pack years: 5.40    Types: Cigars, Cigarettes    Quit date: 10/19/2016    Years since quitting: 4.5   Smokeless tobacco: Never  Vaping Use   Vaping Use: Never used  Substance Use Topics   Alcohol use: Yes    Alcohol/week: 3.0 standard drinks    Types: 3 Standard drinks or equivalent per week   Drug use: No    Home Medications Prior to Admission medications   Medication Sig Start Date End Date Taking? Authorizing Provider  amLODipine (NORVASC) 10 MG tablet Take 1 tablet (10 mg total) by mouth daily. Patient  taking differently: Take 10 mg by mouth every morning. 07/27/17  Yes [name], MD  aspirin EC 81 MG tablet Take 81 mg by mouth every morning. Swallow whole.   Yes [provider]  atorvastatin (LIPITOR) 80 MG tablet Take 80 mg by mouth at bedtime.   Yes [provider]  losartan (COZAAR) 100 MG tablet Take 100 mg by mouth every morning. 04/18/21  Yes [provider]  Multiple Vitamin (MULTIVITAMIN WITH MINERALS) TABS tablet Take 1 tablet by mouth every morning.   Yes [provider]  tamsulosin (FLOMAX) 0.4 MG CAPS capsule Take 0.4 mg by mouth every morning. 04/19/21  Yes [provider]  acetaminophen  (TYLENOL) 500 MG tablet Take 2 tablets (1,000 mg total) by mouth every 6 (six) hours as needed. Patient not taking: Reported on 04/21/2021 07/26/20   Charlesetta Shanks, MD    Allergies    Patient has no known allergies.  Review of Systems   Review of Systems  Constitutional:  Negative for chills and fever.  HENT:  Negative for congestion and facial swelling.   Eyes:  Negative for discharge and visual disturbance.  Respiratory:  Negative for shortness of breath.   Cardiovascular:  Positive for syncope. Negative for chest pain and palpitations.  Gastrointestinal:  Negative for abdominal pain, diarrhea and vomiting.  Musculoskeletal:  Negative for arthralgias and myalgias.  Skin:  Negative for color change and rash.  Neurological:  Positive for syncope. Negative for tremors and headaches.  Psychiatric/Behavioral:  Negative for confusion and dysphoric mood.    Physical Exam Updated Vital Signs BP (!) 146/89   Pulse 92   Temp (!) 97.4 F (36.3 C) (Oral)   Resp 12   Wt 97.4 kg   SpO2 97%   BMI 27.57 kg/m   Physical Exam Vitals and nursing note reviewed.  Constitutional:      Appearance: He is well-developed.  HENT:     Head: Normocephalic and atraumatic.  Eyes:     Pupils: Pupils are equal, round, and reactive to light.  Neck:     Vascular: No JVD.  Cardiovascular:     Rate and Rhythm: Normal rate and regular rhythm.     Heart sounds: No murmur heard.   No friction rub. No gallop.  Pulmonary:     Effort: No respiratory distress.     Breath sounds: No wheezing.  Abdominal:     General: There is no distension.     Tenderness: There is no abdominal tenderness. There is no guarding or rebound.  Musculoskeletal:        General: Normal range of motion.     Cervical back: Normal range of motion and neck supple.  Skin:    Coloration: Skin is not pale.     Findings: No rash.  Neurological:     Mental Status: He is alert and oriented to person, place, and time.  Psychiatric:         Behavior: Behavior normal.    ED Results / Procedures / Treatments   Labs (all labs ordered are listed, but only abnormal results are displayed) Labs Reviewed  CBC - Abnormal; Notable for the following components:      Result Value   MCH 25.3 (*)    RDW 16.1 (*)    All other components within normal limits  COMPREHENSIVE METABOLIC PANEL - Abnormal; Notable for the following components:   Glucose, Bld 154 (*)    Creatinine, Ser 2.00 (*)    Total Bilirubin  1.3 (*)    GFR, Estimated 35 (*)    All other components within normal limits  I-STAT CHEM 8, ED - Abnormal; Notable for the following components:   BUN 24 (*)    Creatinine, Ser 2.10 (*)    Glucose, Bld 147 (*)    Calcium, Ion 1.12 (*)    All other components within normal limits  RESP PANEL BY RT-PCR (FLU A&B, COVID) ARPGX2  PROTIME-INR  APTT  DIFFERENTIAL  ETHANOL    EKG EKG Interpretation  Date/Time:  Friday April 21 2021 13:27:59 EST Ventricular Rate:  77 PR Interval:  132 QRS Duration: 119 QT Interval:  452 QTC Calculation: 512 R Axis:   -34 Text Interpretation: Sinus rhythm Atrial premature complex Left ventricular hypertrophy Probable anterior infarct, age indeterminate Abnormal T, consider ischemia, inferior leads Prolonged QT interval No significant change since last tracing Confirmed by Deno Etienne [phone]) on 04/21/2021 3:01:59 PM  Radiology No results found.  Procedures Procedures   Medications Ordered in ED Medications - No data to display  ED Course  I have reviewed the triage vital signs and the nursing notes.  Pertinent labs & imaging results that were available during my care of the patient were reviewed by me and considered in my medical decision making (see chart for details).    MDM Rules/Calculators/A&P                           70 yo M with a chief complaints of a syncopal event.  This happened while he was at [facility].  The patient feels much better now.  On record review  this is not the first time the patient's been here for an event like this.  Actually got admitted earlier in the year and had a work-up that was largely unremarkable.  Had been sent for cardiology follow-up for possible loop recorder placement however has not been performed as of yet.  Back to baseline now.  There was some concern for strokelike symptoms on arrival and he was made a code stroke which was canceled by the neurologist.  Workup largely unremarkable.  Awaiting neuro recs.    Signed out to Dr. Mariane Masters, please see their note for further details of care.   The patients results and plan were reviewed and discussed.   Any x-rays performed were independently reviewed by myself.   Differential diagnosis were considered with the presenting HPI.  Medications - No data to display  Vitals:   04/21/21 1815 04/21/21 1830 04/21/21 1845 04/21/21 1915  BP: 133/87 (!) 128/93 129/81 (!) 146/89  Pulse: 79 81 79 92  Resp: (!) 21 15 20 12   Temp:      TempSrc:      SpO2: 99% 100% 99% 97%  Weight:        Final diagnoses:  Syncope and collapse    Final Clinical Impression(s) / ED Diagnoses Final diagnoses:  Syncope and collapse    Rx / DC Orders ED Discharge Orders     None        Deno Etienne, DO 04/24/21 0701

## 2021-05-10 ENCOUNTER — Encounter: Payer: Self-pay | Admitting: Nurse Practitioner

## 2021-05-16 ENCOUNTER — Telehealth: Payer: Self-pay

## 2021-05-16 NOTE — Telephone Encounter (Signed)
NOTES SCANNED TO REFERRAL 

## 2021-05-21 HISTORY — PX: COLONOSCOPY: SHX174

## 2021-06-11 NOTE — Progress Notes (Signed)
Cardiology Office Note   Date:  06/12/2021   ID:  Isaac Reyes, DOB 08/06/1950, MRN [id number]  PCP:  Sonia Side., FNP  Cardiologist:   Pixie Casino, MD   Chief Complaint  Patient presents with   Loss of Consciousness      History of Present Illness: Isaac Reyes is a 70 y.o. male who was in the hospital with syncope.   I reviewed these records for this visit.    We had seen him in hte hospital in Feb of last year for syncope.  He has had this in the past as well.   He had previously been scheduled to get a ILR but he did not follow up for this.     In the emergency room there was no clear etiology for his syncope.  He said he was at the [employer].  He was eating and he passed out while seated at the booth.  He kind of describes it as falling asleep.  However, his presenting complaints were bothersome enough that a code stroke was called in the emergency room.  However, there were no acute findings.  When he was in the area before coming back to the ER apparently there was some dysarthria.  Previous work-up has included an echocardiogram which has been essentially unremarkable.  His head CTs have not shown acute findings.  I do not see carotid Dopplers.  I do note that his QTC was prolonged in the emergency room though it is normalized today as below.  He does not report any chest discomfort, neck or arm discomfort.  He does not have any palpitations.  He has had no further syncopal episodes.  He was not orthostatic in the office today.   Past Medical History:  Diagnosis Date   Acute CVA (cerebrovascular accident) ([place]) 06/2017   "speech issues since" (07/24/2017)   AKI (acute kidney injury) ([place]) 07/24/2017   Anemia    High cholesterol    Lupus ([place])    "skin kind"   NSVT (nonsustained ventricular tachycardia)     Past Surgical History:  Procedure Laterality Date   KNEE ARTHROSCOPY     TONSILLECTOMY       Current Outpatient Medications  Medication  Sig Dispense Refill   acetaminophen (TYLENOL) 500 MG tablet Take 2 tablets (1,000 mg total) by mouth every 6 (six) hours as needed. 60 tablet 0   amLODipine (NORVASC) 10 MG tablet Take 1 tablet (10 mg total) by mouth daily. (Patient taking differently: Take 10 mg by mouth every morning.) 30 tablet 0   aspirin EC 81 MG tablet Take 81 mg by mouth every morning. Swallow whole.     Multiple Vitamin (MULTIVITAMIN WITH MINERALS) TABS tablet Take 1 tablet by mouth every morning.     No current facility-administered medications for this visit.    Allergies:   Patient has no known allergies.   ROS:  Please see the history of present illness.   Otherwise, review of systems are positive for none.   All other systems are reviewed and negative.    PHYSICAL EXAM: VS:  BP (!) 150/90    Pulse 63    Wt 219 lb 9.6 oz (99.6 kg)    SpO2 97%    BMI 28.19 kg/m  , BMI Body mass index is 28.19 kg/m.   GENERAL:  Well appearing HEENT:  Pupils equal round and reactive, fundi not visualized, oral mucosa unremarkable NECK:  No jugular venous distention,  waveform within normal limits, carotid upstroke brisk and symmetric, no bruits, no thyromegaly LYMPHATICS:  No cervical, inguinal adenopathy LUNGS:  Clear to auscultation bilaterally BACK:  No CVA tenderness CHEST:  Unremarkable HEART:  PMI not displaced or sustained,S1 and S2 within normal limits, no S3, no S4, no clicks, no rubs, no murmurs ABD:  Flat, positive bowel sounds normal in frequency in pitch, no bruits, no rebound, no guarding, no midline pulsatile mass, no hepatomegaly, no splenomegaly EXT:  2 plus pulses throughout, no edema, no cyanosis no clubbing SKIN:  No rashes no nodules NEURO:  Cranial nerves II through XII grossly intact, motor grossly intact throughout PSYCH:  Cognitively intact, oriented to person place and time    EKG:  EKG is ordered today. The ekg ordered today demonstrates normal sinus rhythm, rate 63, left axis deviation,  anterolateral T wave inversions cannot exclude ischemia unchanged from the emergency room EKG.   Recent Labs: 04/21/2021: ALT 26; BUN 24; Creatinine, Ser 2.10; Hemoglobin 16.7; Platelets 202; Potassium 3.6; Sodium 139    Lipid Panel    Component Value Date/Time   CHOL 148 07/17/2018 0559   TRIG 39 07/17/2018 0559   HDL 58 07/17/2018 0559   CHOLHDL 2.6 07/17/2018 0559   VLDL 8 07/17/2018 0559   LDLCALC 82 07/17/2018 0559      Wt Readings from Last 3 Encounters:  06/12/21 219 lb 9.6 oz (99.6 kg)  04/21/21 214 lb 11.7 oz (97.4 kg)  09/04/20 180 lb (81.6 kg)      Other studies Reviewed: Additional studies/ records that were reviewed today include: ED records. Review of the above records demonstrates:  Please see elsewhere in the note.     ASSESSMENT AND PLAN:  Syncope: The etiology of this is not clear.  He has had previous substance use.  This was not related to the last event apparently.  I am going to try to send him a 4-week monitor.  I will also order carotid Dopplers.     HTN: Mildly elevated BP.  However, he has contraindications to multiple medications and with his syncope I am going to allow slight permissive hypertension.   History of CVA:   There was no clear etiology.  I will check a carotid Dopplers as above.   Abnormal EKG: He does have new T wave inversions on his anterolateral EKG which were noted in the emergency room and new from previous.  I will send him for a stress test.  He would not be able to walk on a treadmill so he will have a perfusion study.  Current medicines are reviewed at length with the patient today.  The patient does not have concerns regarding medicines.  The following changes have been made:  no change  Labs/ tests ordered today include:   Orders Placed This Encounter  Procedures   Cardiac Stress Test: Informed Consent Details: Physician/Practitioner Attestation; Transcribe to consent form and obtain patient signature   CARDIAC EVENT  MONITOR   CARDIAC EVENT MONITOR   MYOCARDIAL PERFUSION IMAGING   EKG 12-Lead   VAS US CAROTID     Disposition:   FU with APP in 6 weeks   Signed, Minus Breeding, MD  06/12/2021 11:48 AM    [place]

## 2021-06-12 ENCOUNTER — Encounter: Payer: Self-pay | Admitting: *Deleted

## 2021-06-12 ENCOUNTER — Encounter: Payer: Self-pay | Admitting: Cardiology

## 2021-06-12 ENCOUNTER — Ambulatory Visit (INDEPENDENT_AMBULATORY_CARE_PROVIDER_SITE_OTHER): Payer: Medicare Other | Admitting: Cardiology

## 2021-06-12 ENCOUNTER — Other Ambulatory Visit: Payer: Self-pay

## 2021-06-12 VITALS — BP 150/90 | HR 63 | Wt 219.6 lb

## 2021-06-12 DIAGNOSIS — I10 Essential (primary) hypertension: Secondary | ICD-10-CM | POA: Diagnosis not present

## 2021-06-12 DIAGNOSIS — E785 Hyperlipidemia, unspecified: Secondary | ICD-10-CM | POA: Diagnosis not present

## 2021-06-12 DIAGNOSIS — R55 Syncope and collapse: Secondary | ICD-10-CM | POA: Diagnosis not present

## 2021-06-12 NOTE — Progress Notes (Signed)
Patient ID: Isaac Reyes, male   DOB: 07-26-1950, 71 y.o.   MRN: [id number] Patient enrolled for [employer] to ship a 30 day cardiac event monitor to his address on file.

## 2021-06-12 NOTE — Patient Instructions (Signed)
Medication Instructions:  Your physician recommends that you continue on your current medications as directed. Please refer to the Current Medication list given to you today.   *If you need a refill on your cardiac medications before your next appointment, please call your pharmacy*  Lab Work: NONE  Testing/Procedures: Your physician has requested that you have a carotid duplex. This test is an ultrasound of the carotid arteries in your neck. It looks at blood flow through these arteries that supply the brain with blood. Allow one hour for this exam. There are no restrictions or special instructions.  Your physician has requested that you have a lexiscan myoview. For further information please visit [email or web address]. Please follow instruction sheet, as given.  Your physician has recommended that you wear an event monitor. Event monitors are medical devices that record the hearts electrical activity. Doctors most often [place] these monitors to diagnose arrhythmias. Arrhythmias are problems with the speed or rhythm of the heartbeat. The monitor is a small, portable device. You can wear one while you do your normal daily activities. This is usually used to diagnose what is causing palpitations/syncope (passing out).  Follow-Up: At [facility], you and your health needs are our priority.  As part of our continuing mission to provide you with exceptional heart care, we have created designated Provider Care Teams.  These Care Teams include your primary Cardiologist (physician) and Advanced Practice Providers (APPs -  Physician Assistants and Nurse Practitioners) who all work together to provide you with the care you need, when you need it.  We recommend signing up for the patient portal called "MyChart".  Sign up information is provided on this After Visit Summary.  MyChart is used to connect with patients for Virtual Visits (Telemedicine).  Patients are able to view lab/test results, encounter notes,  upcoming appointments, etc.  Non-urgent messages can be sent to your provider as well.   To learn more about what you can do with MyChart, go to [email or web address].    Your next appointment:   6 week(s)  The format for your next appointment:   In Person  Provider:    WITH NP OR PA   Other Instructions [employer] Cardiac Event Monitor Instructions Your physician has requested you wear your cardiac event monitor for __30___ days, (1-30). [employer] may call or text to confirm a shipping address. The monitor will be sent to a land address via UPS. [employer] will not ship a monitor to a PO BOX. It typically takes 3-5 days to receive your monitor after it has been enrolled. [employer] will assist with USPS tracking if your package is delayed. The telephone number for [employer] is [phone]. Once you have received your monitor, please review the enclosed instructions. Instruction tutorials can also be viewed under help and settings on the enclosed cell phone. Your monitor has already been registered assigning a specific monitor serial # to you.  Applying the monitor Remove cell phone from case and turn it on. The cell phone works as Dealer and needs to be within [employer] of you at all times. The cell phone will need to be charged on a daily basis. We recommend you plug the cell phone into the enclosed charger at your bedside table every night.  Monitor batteries: You will receive two monitor batteries labelled #1 and #2. These are your recorders. Plug battery #2 onto the second connection on the enclosed charger. Keep one battery on the charger at all times. This will keep the monitor battery  deactivated. It will also keep it fully charged for when you need to switch your monitor batteries. A small light will be blinking on the battery emblem when it is charging. The light on the battery emblem will remain on when the battery is fully charged.  Open package of a  Monitor strip. Insert battery #1 into black hood on strip and gently squeeze monitor battery onto connection as indicated in instruction booklet. Set aside while preparing skin.  Choose location for your strip, vertical or horizontal, as indicated in the instruction booklet. Shave to remove all hair from location. There cannot be any lotions, oils, powders, or colognes on skin where monitor is to be applied. Wipe skin clean with enclosed Saline wipe. Dry skin completely.  Peel paper labeled #1 off the back of the Monitor strip exposing the adhesive. Place the monitor on the chest in the vertical or horizontal position shown in the instruction booklet. One arrow on the monitor strip must be pointing upward. Carefully remove paper labeled #2, attaching remainder of strip to your skin. Try not to create any folds or wrinkles in the strip as you apply it.  Firmly press and release the circle in the center of the monitor battery. You will hear a small beep. This is turning the monitor battery on. The heart emblem on the monitor battery will light up every 5 seconds if the monitor battery in turned on and connected to the patient securely. Do not push and hold the circle down as this turns the monitor battery off. The cell phone will locate the monitor battery. A screen will appear on the cell phone checking the connection of your monitor strip. This may read poor connection initially but change to good connection within the next minute. Once your monitor accepts the connection you will hear a series of 3 beeps followed by a climbing crescendo of beeps. A screen will appear on the cell phone showing the two monitor strip placement options. Touch the picture that demonstrates where you applied the monitor strip.  Your monitor strip and battery are waterproof. You are able to shower, bathe, or swim with the monitor on. They just ask you do not submerge deeper than 3 feet underwater. We recommend  removing the monitor if you are swimming in a lake, river, or ocean.  Your monitor battery will need to be switched to a fully charged monitor battery approximately once a week. The cell phone will alert you of an action which needs to be made.  On the cell phone, tap for details to reveal connection status, monitor battery status, and cell phone battery status. The green dots indicates your monitor is in good status. A red dot indicates there is something that needs your attention.  To record a symptom, click the circle on the monitor battery. In 30-60 seconds a list of symptoms will appear on the cell phone. Select your symptom and tap save. Your monitor will record a sustained or significant arrhythmia regardless of you clicking the button. Some patients do not feel the heart rhythm irregularities. [employer] will notify us of any serious or critical events.  Refer to instruction booklet for instructions on switching batteries, changing strips, the Do not disturb or Pause features, or any additional questions.  Call [employer] at [phone], to confirm your monitor is transmitting and record your baseline. They will answer any questions you may have regarding the monitor instructions at that time.  Returning the monitor to [place] all equipment back  into blue box. Peel off strip of paper to expose adhesive and close box securely. There is a prepaid UPS shipping label on this box. Drop in a UPS drop box, or at a UPS facility like [employer]. You may also contact [employer] to arrange UPS to pick up monitor package at your home.

## 2021-06-27 ENCOUNTER — Ambulatory Visit (HOSPITAL_COMMUNITY)
Admission: RE | Admit: 2021-06-27 | Payer: Medicare Other | Source: Ambulatory Visit | Attending: Cardiology | Admitting: Cardiology

## 2021-06-30 ENCOUNTER — Telehealth (HOSPITAL_COMMUNITY): Payer: Self-pay | Admitting: *Deleted

## 2021-06-30 NOTE — Telephone Encounter (Signed)
Close encounter 

## 2021-07-04 ENCOUNTER — Telehealth (HOSPITAL_COMMUNITY): Payer: Self-pay | Admitting: *Deleted

## 2021-07-04 NOTE — Telephone Encounter (Signed)
Close encounter 

## 2021-07-05 ENCOUNTER — Ambulatory Visit (HOSPITAL_COMMUNITY)
Admission: RE | Admit: 2021-07-05 | Payer: Medicare Other | Source: Ambulatory Visit | Attending: Cardiology | Admitting: Cardiology

## 2021-07-05 ENCOUNTER — Encounter (HOSPITAL_COMMUNITY): Payer: Self-pay | Admitting: Cardiology

## 2021-07-10 ENCOUNTER — Encounter (HOSPITAL_COMMUNITY): Payer: Self-pay

## 2021-07-12 NOTE — Progress Notes (Signed)
Patient ID: Isaac Reyes, male   DOB: 10/30/1950, 71 y.o.   MRN: [id number] Received notification from [employer] the patients study will be cancelled per request of patient.  [employer] will begin the recovery process of monitor serial # [id number]. Order will be cancelled.

## 2021-07-19 ENCOUNTER — Encounter: Payer: Medicare Other | Admitting: Physician Assistant

## 2021-07-21 NOTE — Progress Notes (Signed)
This encounter was created in error - please disregard.

## 2021-08-28 ENCOUNTER — Ambulatory Visit (HOSPITAL_COMMUNITY)
Admission: RE | Admit: 2021-08-28 | Payer: Medicare Other | Source: Ambulatory Visit | Attending: Cardiology | Admitting: Cardiology

## 2021-10-31 ENCOUNTER — Encounter (HOSPITAL_BASED_OUTPATIENT_CLINIC_OR_DEPARTMENT_OTHER): Payer: Self-pay

## 2021-10-31 ENCOUNTER — Emergency Department (HOSPITAL_BASED_OUTPATIENT_CLINIC_OR_DEPARTMENT_OTHER)
Admission: EM | Admit: 2021-10-31 | Discharge: 2021-10-31 | Discharge status: Against Medical Advice | Payer: Medicare Other | Attending: Emergency Medicine | Admitting: Emergency Medicine

## 2021-10-31 ENCOUNTER — Emergency Department (HOSPITAL_BASED_OUTPATIENT_CLINIC_OR_DEPARTMENT_OTHER): Payer: Medicare Other

## 2021-10-31 DIAGNOSIS — R531 Weakness: Secondary | ICD-10-CM | POA: Diagnosis present

## 2021-10-31 DIAGNOSIS — Z79899 Other long term (current) drug therapy: Secondary | ICD-10-CM | POA: Diagnosis not present

## 2021-10-31 DIAGNOSIS — I10 Essential (primary) hypertension: Secondary | ICD-10-CM | POA: Insufficient documentation

## 2021-10-31 DIAGNOSIS — Z5329 Procedure and treatment not carried out because of patient's decision for other reasons: Secondary | ICD-10-CM | POA: Diagnosis not present

## 2021-10-31 DIAGNOSIS — Z7982 Long term (current) use of aspirin: Secondary | ICD-10-CM | POA: Diagnosis not present

## 2021-10-31 DIAGNOSIS — R778 Other specified abnormalities of plasma proteins: Secondary | ICD-10-CM | POA: Diagnosis not present

## 2021-10-31 DIAGNOSIS — Y900 Blood alcohol level of less than 20 mg/100 ml: Secondary | ICD-10-CM | POA: Insufficient documentation

## 2021-10-31 LAB — COMPREHENSIVE METABOLIC PANEL
ALT: 22 U/L (ref 0–44)
AST: 26 U/L (ref 15–41)
Albumin: 3.3 g/dL — ABNORMAL LOW (ref 3.5–5.0)
Alkaline Phosphatase: 90 U/L (ref 38–126)
Anion gap: 8 (ref 5–15)
BUN: 18 mg/dL (ref 8–23)
CO2: 26 mmol/L (ref 22–32)
Calcium: 8.6 mg/dL — ABNORMAL LOW (ref 8.9–10.3)
Chloride: 106 mmol/L (ref 98–111)
Creatinine, Ser: 1.4 mg/dL — ABNORMAL HIGH (ref 0.61–1.24)
GFR, Estimated: 54 mL/min — ABNORMAL LOW (ref >60)
Glucose, Bld: 164 mg/dL — ABNORMAL HIGH (ref 70–99)
Potassium: 3.3 mmol/L — ABNORMAL LOW (ref 3.5–5.1)
Sodium: 140 mmol/L (ref 135–145)
Total Bilirubin: 1.1 mg/dL (ref 0.3–1.2)
Total Protein: 7.1 g/dL (ref 6.5–8.1)

## 2021-10-31 LAB — CBC WITH DIFFERENTIAL/PLATELET
Abs Immature Granulocytes: 0.01 10*3/uL (ref 0.00–0.07)
Basophils Absolute: 0 10*3/uL (ref 0.0–0.1)
Basophils Relative: 1 %
Eosinophils Absolute: 0.1 10*3/uL (ref 0.0–0.5)
Eosinophils Relative: 2 %
HCT: 45.2 % (ref 39.0–52.0)
Hemoglobin: 14.3 g/dL (ref 13.0–17.0)
Immature Granulocytes: 0 %
Lymphocytes Relative: 22 %
Lymphs Abs: 1 10*3/uL (ref 0.7–4.0)
MCH: 26.2 pg (ref 26.0–34.0)
MCHC: 31.6 g/dL (ref 30.0–36.0)
MCV: 82.9 fL (ref 80.0–100.0)
Monocytes Absolute: 0.5 10*3/uL (ref 0.1–1.0)
Monocytes Relative: 11 %
Neutro Abs: 3.1 10*3/uL (ref 1.7–7.7)
Neutrophils Relative %: 64 %
Platelets: 171 10*3/uL (ref 150–400)
RBC: 5.45 MIL/uL (ref 4.22–5.81)
RDW: 16 % — ABNORMAL HIGH (ref 11.5–15.5)
WBC: 4.7 10*3/uL (ref 4.0–10.5)
nRBC: 0 % (ref 0.0–0.2)

## 2021-10-31 LAB — LIPASE, BLOOD: Lipase: 33 U/L (ref 11–51)

## 2021-10-31 LAB — TROPONIN I (HIGH SENSITIVITY): Troponin I (High Sensitivity): 161 ng/L (ref <18)

## 2021-10-31 LAB — ETHANOL: Alcohol, Ethyl (B): <10 mg/dL (ref <10)

## 2021-10-31 LAB — AMMONIA: Ammonia: 27 umol/L (ref 9–35)

## 2021-10-31 NOTE — ED Provider Notes (Signed)
Patient Isaac Reyes EMERGENCY DEPARTMENT Provider Note   CSN: [id number] Arrival date & time: 10/31/21  1731     History  Chief Complaint  Patient presents with   Weakness    Isaac Reyes is a 71 y.o. male.  Patient here with generalized weakness for the last several days.  Patient has history of stroke and difficulty with speech at baseline.  Denies any stroke symptoms.  No weakness or numbness focally.  States he lives by himself.  He denies any nausea, vomiting, diarrhea, melena, chest pain, shortness of breath.  Will not answer if he is using any alcohol or drugs.  Overall he is concerned that he is dehydrated.  Nothing has made it worse or better.  Has a history of hypertension but has not been taking his medications.  The history is provided by the patient.       Home Medications Prior to Admission medications   Medication Sig Start Date End Date Taking? Authorizing Provider  acetaminophen (TYLENOL) 500 MG tablet Take 2 tablets (1,000 mg total) by mouth every 6 (six) hours as needed. 07/26/20   Charlesetta Shanks, MD  amLODipine (NORVASC) 10 MG tablet Take 1 tablet (10 mg total) by mouth daily. Patient taking differently: Take 10 mg by mouth every morning. 07/27/17   [name], MD  aspirin EC 81 MG tablet Take 81 mg by mouth every morning. Swallow whole.    [provider]  Multiple Vitamin (MULTIVITAMIN WITH MINERALS) TABS tablet Take 1 tablet by mouth every morning.    [provider]      Allergies    Patient has no known allergies.    Review of Systems   Review of Systems  Physical Exam Updated Vital Signs BP (!) 173/98   Pulse 67   Temp 98.6 F (37 C) (Oral)   Resp 19   Ht 6\' 2"  (1.88 m)   SpO2 97%   BMI 28.19 kg/m  Physical Exam Vitals and nursing note reviewed.  Constitutional:      General: He is not in acute distress.    Appearance: He is well-developed. He is not ill-appearing.  HENT:     Head: Normocephalic and  atraumatic.     Nose: Nose normal.     Mouth/Throat:     Mouth: Mucous membranes are moist.  Eyes:     Extraocular Movements: Extraocular movements intact.     Conjunctiva/sclera: Conjunctivae normal.     Pupils: Pupils are equal, round, and reactive to light.  Cardiovascular:     Rate and Rhythm: Normal rate and regular rhythm.     Pulses: Normal pulses.     Heart sounds: Normal heart sounds. No murmur heard. Pulmonary:     Effort: Pulmonary effort is normal. No respiratory distress.     Breath sounds: Normal breath sounds.  Abdominal:     Palpations: Abdomen is soft.     Tenderness: There is no abdominal tenderness.  Musculoskeletal:        General: No swelling.     Cervical back: Neck supple.  Skin:    General: Skin is warm and dry.     Capillary Refill: Capillary refill takes less than 2 seconds.  Neurological:     General: No focal deficit present.     Mental Status: He is alert and oriented to person, place, and time.     Cranial Nerves: No cranial nerve deficit.     Sensory: No sensory deficit.  Motor: No weakness.     Coordination: Coordination normal.     Comments: 5+ out of 5 strength throughout, normal sensation, seems like he has normal speech, normal finger-to-nose finger, pupils are equal and reactive  Psychiatric:        Mood and Affect: Mood normal.     ED Results / Procedures / Treatments   Labs (all labs ordered are listed, but only abnormal results are displayed) Labs Reviewed  CBC WITH DIFFERENTIAL/PLATELET - Abnormal; Notable for the following components:      Result Value   RDW 16.0 (*)    All other components within normal limits  COMPREHENSIVE METABOLIC PANEL - Abnormal; Notable for the following components:   Potassium 3.3 (*)    Glucose, Bld 164 (*)    Creatinine, Ser 1.40 (*)    Calcium 8.6 (*)    Albumin 3.3 (*)    GFR, Estimated 54 (*)    All other components within normal limits  TROPONIN I (HIGH SENSITIVITY) - Abnormal; Notable  for the following components:   Troponin I (High Sensitivity) 161 (*)    All other components within normal limits  LIPASE, BLOOD  ETHANOL  AMMONIA    EKG EKG Interpretation  Date/Time:  Tuesday October 31 2021 17:51:46 EDT Ventricular Rate:  78 PR Interval:  172 QRS Duration: 104 QT Interval:  388 QTC Calculation: 442 R Axis:   -26 Text Interpretation: Sinus rhythm Probable left atrial enlargement Confirmed by Lennice Sites ([id number]) on 10/31/2021 5:58:37 PM  Radiology CT Head Wo Contrast  Result Date: 10/31/2021 CLINICAL DATA:  Mental status change, unknown cause EXAM: CT HEAD WITHOUT CONTRAST TECHNIQUE: Contiguous axial images were obtained from the base of the skull through the vertex without intravenous contrast. RADIATION DOSE REDUCTION: This exam was performed according to the departmental dose-optimization program which includes automated exposure control, adjustment of the mA and/or kV according to patient size and/or use of iterative reconstruction technique. COMPARISON:  04/21/2021 FINDINGS: Brain: Normal anatomic configuration. Parenchymal volume loss is commensurate with the patient's age. Stable moderate periventricular white matter changes are present likely reflecting the sequela of small vessel ischemia. Remote bilateral periventricular, bilateral thalamic, right insular and left basal ganglia lacunar infarcts are again noted. No abnormal intra or extra-axial mass lesion or fluid collection. No abnormal mass effect or midline shift. No evidence of acute intracranial hemorrhage or infarct. Ventricular size is normal. Cerebellum unremarkable. Vascular: No asymmetric hyperdense vasculature at the skull base. Skull: Intact Sinuses/Orbits: Paranasal sinuses are clear. Orbits are unremarkable. Other: Mastoid air cells and middle ear cavities are clear. IMPRESSION: 1. No acute intracranial abnormality. 2. Stable moderate periventricular white matter disease, likely reflecting the sequela of  small vessel ischemia. 3. Remote bilateral lacunar infarcts. Electronically Signed   By: Fidela Salisbury M.D.   On: 10/31/2021 18:28   DG Chest Portable 1 View  Result Date: 10/31/2021 CLINICAL DATA:  Weakness. EXAM: PORTABLE CHEST 1 VIEW COMPARISON:  AP chest 09/27/2020 and 09/04/2020 FINDINGS: Cardiac silhouette is again at the upper limits of normal size for AP technique. Mild calcification within aortic arch. There is again moderate elevation of the left hemidiaphragm with left greater than right basilar bronchovascular crowding. No focal airspace opacity. No pulmonary edema pleural effusion or pneumothorax. Mild dextrocurvature of the mid to lower thoracic spine. IMPRESSION: Chronic elevation of the left hemidiaphragm.  No acute lung process. Electronically Signed   By: Yvonne Kendall M.D.   On: 10/31/2021 18:07    Procedures Procedures  Medications Ordered in ED Medications - No data to display  ED Course/ Medical Decision Making/ A&P                           Medical Decision Making Amount and/or Complexity of Data Reviewed Labs: ordered. Radiology: ordered.   Mostyn Varnell Redmon is here with generalized weakness and concern for dehydration.  Blood pressure mildly elevated but otherwise normal vitals.  History of cocaine abuse and high blood pressure.  Overall nonspecific history.  States that he feels weak and dehydrated.  Denies any recent alcohol or drug use but does not particularly answer this question.  Denies any chest pain, shortness of breath, abdominal pain, melena, hematochezia.  Denies any poor p.o. intake.  Lives by himself.  Overall not a great historian.  We will cast a broad net to evaluate for cardiac, pulmonary, neurologic issues.  We will get CT head, troponin, CBC, CMP, urinalysis, chest x-ray.  Will evaluate for electrolyte abnormalities, dehydration.  Will give IV fluid bolus.  EKG per my review interpretation shows sinus rhythm.  No ischemic changes.  Per my review  and interpretation of chest x-ray and CT scan, no acute findings.  No head bleed.  No pneumonia.  Per my review and interpretation of labs, no significant anemia, electrolyte abnormality, kidney injury, leukocytosis or anemia.  Troponin is elevated to 161.  Baseline appears to be around 90.  Patient with no chest pain.  Not sure if this is from demand from elevated blood pressure cocaine use but he denies any recent drug use.  I did recommend admission to the hospital for further cardiac work-up but he declined.  He understood risks and benefits.  Patient left AMA.  Strongly encouraged him to follow-up with his primary care doctor and cardiologist.  I reached out to cardiology team to help arrange for outpatient follow-up.   This chart was dictated using voice recognition software.  Despite best efforts to proofread,  errors can occur which can change the documentation meaning.         Final Clinical Impression(s) / ED Diagnoses Final diagnoses:  Weakness    Rx / DC Orders ED Discharge Orders     None         Lennice Sites, DO 10/31/21 [id number]

## 2021-10-31 NOTE — ED Triage Notes (Signed)
C/o generalized fatigue x several days. Hasnt taken antihypertensives in several weeks.  Hx cva, aphasia at baseline.

## 2021-10-31 NOTE — ED Notes (Signed)
ED Provider at bedside. 

## 2021-10-31 NOTE — ED Notes (Signed)
MD aware of patient's BP.  Family wants to speak with patient prior to leaving AMA.  Charge RN aware.

## 2021-10-31 NOTE — Discharge Instructions (Signed)
Follow-up with your primary care doctor, cardiology as discussed.  Make sure you are taking her blood pressure medications.

## 2021-10-31 NOTE — ED Notes (Signed)
Patient transported to CT 

## 2021-10-31 NOTE — ED Notes (Signed)
After speaking with family members, patient continues to want to leave AMA.  Forms signed.  Patient and family encouraged to return if needed or if symptoms worsen or change

## 2021-11-01 ENCOUNTER — Encounter (HOSPITAL_COMMUNITY): Payer: Self-pay

## 2021-11-01 ENCOUNTER — Emergency Department (HOSPITAL_COMMUNITY): Payer: Medicare Other

## 2021-11-01 ENCOUNTER — Emergency Department (HOSPITAL_COMMUNITY)
Admission: EM | Admit: 2021-11-01 | Discharge: 2021-11-01 | Discharge status: Against Medical Advice | Payer: Medicare Other | Attending: Emergency Medicine | Admitting: Emergency Medicine

## 2021-11-01 ENCOUNTER — Other Ambulatory Visit: Payer: Self-pay

## 2021-11-01 DIAGNOSIS — R011 Cardiac murmur, unspecified: Secondary | ICD-10-CM | POA: Diagnosis not present

## 2021-11-01 DIAGNOSIS — R778 Other specified abnormalities of plasma proteins: Secondary | ICD-10-CM

## 2021-11-01 DIAGNOSIS — Z7982 Long term (current) use of aspirin: Secondary | ICD-10-CM | POA: Diagnosis not present

## 2021-11-01 DIAGNOSIS — R5383 Other fatigue: Secondary | ICD-10-CM | POA: Diagnosis not present

## 2021-11-01 DIAGNOSIS — R7309 Other abnormal glucose: Secondary | ICD-10-CM | POA: Diagnosis not present

## 2021-11-01 DIAGNOSIS — R748 Abnormal levels of other serum enzymes: Secondary | ICD-10-CM | POA: Diagnosis not present

## 2021-11-01 DIAGNOSIS — Z79899 Other long term (current) drug therapy: Secondary | ICD-10-CM | POA: Insufficient documentation

## 2021-11-01 DIAGNOSIS — R531 Weakness: Secondary | ICD-10-CM | POA: Insufficient documentation

## 2021-11-01 LAB — URINALYSIS, ROUTINE W REFLEX MICROSCOPIC
Bacteria, UA: NONE SEEN
Bilirubin Urine: NEGATIVE
Glucose, UA: NEGATIVE mg/dL
Hgb urine dipstick: NEGATIVE
Ketones, ur: NEGATIVE mg/dL
Leukocytes,Ua: NEGATIVE
Nitrite: NEGATIVE
Protein, ur: 100 mg/dL — AB
Specific Gravity, Urine: 1.024 (ref 1.005–1.030)
pH: 5 (ref 5.0–8.0)

## 2021-11-01 LAB — TROPONIN I (HIGH SENSITIVITY)
Troponin I (High Sensitivity): 164 ng/L (ref <18)
Troponin I (High Sensitivity): 178 ng/L (ref <18)

## 2021-11-01 LAB — BASIC METABOLIC PANEL
Anion gap: 9 (ref 5–15)
BUN: 24 mg/dL — ABNORMAL HIGH (ref 8–23)
CO2: 25 mmol/L (ref 22–32)
Calcium: 9.4 mg/dL (ref 8.9–10.3)
Chloride: 108 mmol/L (ref 98–111)
Creatinine, Ser: 1.39 mg/dL — ABNORMAL HIGH (ref 0.61–1.24)
GFR, Estimated: 54 mL/min — ABNORMAL LOW (ref >60)
Glucose, Bld: 117 mg/dL — ABNORMAL HIGH (ref 70–99)
Potassium: 3.7 mmol/L (ref 3.5–5.1)
Sodium: 142 mmol/L (ref 135–145)

## 2021-11-01 LAB — CBC
HCT: 50.7 % (ref 39.0–52.0)
Hemoglobin: 15.4 g/dL (ref 13.0–17.0)
MCH: 25.8 pg — ABNORMAL LOW (ref 26.0–34.0)
MCHC: 30.4 g/dL (ref 30.0–36.0)
MCV: 84.9 fL (ref 80.0–100.0)
Platelets: 116 10*3/uL — ABNORMAL LOW (ref 150–400)
RBC: 5.97 MIL/uL — ABNORMAL HIGH (ref 4.22–5.81)
RDW: 16 % — ABNORMAL HIGH (ref 11.5–15.5)
WBC: 4.8 10*3/uL (ref 4.0–10.5)
nRBC: 0 % (ref 0.0–0.2)

## 2021-11-01 LAB — RAPID URINE DRUG SCREEN, HOSP PERFORMED
Amphetamines: NOT DETECTED
Barbiturates: NOT DETECTED
Benzodiazepines: NOT DETECTED
Cocaine: POSITIVE — AB
Opiates: NOT DETECTED
Tetrahydrocannabinol: NOT DETECTED

## 2021-11-01 LAB — C-REACTIVE PROTEIN: CRP: 0.6 mg/dL (ref <1.0)

## 2021-11-01 LAB — CBG MONITORING, ED: Glucose-Capillary: 136 mg/dL — ABNORMAL HIGH (ref 70–99)

## 2021-11-01 LAB — SEDIMENTATION RATE: Sed Rate: 15 mm/hr (ref 0–16)

## 2021-11-01 LAB — MAGNESIUM: Magnesium: 2.5 mg/dL — ABNORMAL HIGH (ref 1.7–2.4)

## 2021-11-01 MED ORDER — HYDRALAZINE HCL 20 MG/ML IJ SOLN: 10.0000 mg | Freq: Once | INTRAMUSCULAR | Status: DC

## 2021-11-01 MED ORDER — NITROGLYCERIN 0.4 MG SL SUBL: 0.4000 mg | SUBLINGUAL_TABLET | SUBLINGUAL | Status: DC | PRN

## 2021-11-01 MED ORDER — ASPIRIN 325 MG PO TABS
325.0000 mg | ORAL_TABLET | Freq: Every day | ORAL | Status: DC
  Administered 2021-11-01: 325 mg via ORAL
  Filled 2021-11-01: qty 1

## 2021-11-01 NOTE — ED Notes (Signed)
Pt is aware urine sample is needed. Urine cup provide

## 2021-11-01 NOTE — ED Notes (Signed)
Unwitnessed fall. Pt ambulating to leave. Pt offered a wheelchair due to unsteady gait. Pt states yes. When this RN arrived back with wheelchair pt was on the ground. This RN asked if pt had a fall. Pt responded yes. Pt denies injury or hitting his head.

## 2021-11-01 NOTE — ED Triage Notes (Signed)
Pt. Complains of weakness over the last week and a decline in mobility from baseline. Family and Pt. Are concerned of a possible Lupus flare.

## 2021-11-01 NOTE — ED Provider Notes (Signed)
[place] DEPT Provider Note   CSN: [id number] Arrival date & time: 11/01/21  0800     History  Chief Complaint  Patient presents with   Weakness    Isaac Reyes is a 71 y.o. male.   Weakness   71 year old male with a history of CVA with no residual deficits, cutaneous lupus, HLD, anemia, presenting to the emergency department with a chief complaint of generalized weakness.  The patient was seen at the [facility] yesterday and left AMA after initial work-up revealed an elevated troponin to 161.  Per chart review, the patient presented for generalized weakness for the last several days.  He had denied any stroke symptoms, had a negative CT head and had no focal neurologic deficits.  He had denied any symptoms of ACS.  Had been noncompliant with his antihypertensives.  It been recommended that the patient be admitted to the hospital for cardiac work-up with the patient left AMA.  He presents to the emergency department today with persistent symptoms.  He is states that nothing has changed but continues to feel generalized weakness and fatigue.  He denies any chest pain or shortness of breath, fevers or chills.  He denies any bilateral lower extremity swelling.  He denies any recent drug use.  He denies any new focal neurologic deficits.  Home Medications Prior to Admission medications   Medication Sig Start Date End Date Taking? Authorizing Provider  acetaminophen (TYLENOL) 500 MG tablet Take 2 tablets (1,000 mg total) by mouth every 6 (six) hours as needed. 07/26/20  Yes Charlesetta Shanks, MD  amLODipine (NORVASC) 10 MG tablet Take 1 tablet (10 mg total) by mouth daily. Patient taking differently: Take 10 mg by mouth every morning. 07/27/17  Yes [name], MD  aspirin EC 81 MG tablet Take 81 mg by mouth every morning. Swallow whole.   Yes [provider]  atorvastatin (LIPITOR) 80 MG tablet Take 80 mg by mouth daily. 10/26/21  Yes  [provider]  losartan (COZAAR) 100 MG tablet Take 100 mg by mouth daily. 10/26/21  Yes [provider]  Multiple Vitamin (MULTIVITAMIN WITH MINERALS) TABS tablet Take 1 tablet by mouth every morning.   Yes [provider]      Allergies    Patient has no known allergies.    Review of Systems   Review of Systems  Neurological:  Positive for weakness.    Physical Exam Updated Vital Signs BP (!) 185/121   Pulse 65   Temp 98.1 F (36.7 C) (Oral)   Resp (!) 21   Ht 6\' 2"  (1.88 m)   SpO2 100%   BMI 28.19 kg/m  Physical Exam Vitals and nursing note reviewed.  Constitutional:      General: He is not in acute distress.    Appearance: He is well-developed.  HENT:     Head: Normocephalic and atraumatic.  Eyes:     Conjunctiva/sclera: Conjunctivae normal.  Cardiovascular:     Rate and Rhythm: Normal rate and regular rhythm.     Heart sounds: Murmur heard.  Pulmonary:     Effort: Pulmonary effort is normal. No respiratory distress.     Breath sounds: Normal breath sounds.  Abdominal:     Palpations: Abdomen is soft.     Tenderness: There is no abdominal tenderness.  Musculoskeletal:        General: No swelling.     Cervical back: Neck supple.  Skin:    General: Skin  is warm and dry.     Capillary Refill: Capillary refill takes less than 2 seconds.  Neurological:     General: No focal deficit present.     Mental Status: He is alert and oriented to person, place, and time. Mental status is at baseline.     Cranial Nerves: No cranial nerve deficit.     Sensory: No sensory deficit.     Motor: No weakness.  Psychiatric:        Mood and Affect: Mood normal.     ED Results / Procedures / Treatments   Labs (all labs ordered are listed, but only abnormal results are displayed) Labs Reviewed  BASIC METABOLIC PANEL - Abnormal; Notable for the following components:      Result Value   Glucose, Bld 117 (*)    BUN 24 (*)    Creatinine, Ser 1.39 (*)     GFR, Estimated 54 (*)    All other components within normal limits  CBC - Abnormal; Notable for the following components:   RBC 5.97 (*)    MCH 25.8 (*)    RDW 16.0 (*)    Platelets 116 (*)    All other components within normal limits  MAGNESIUM - Abnormal; Notable for the following components:   Magnesium 2.5 (*)    All other components within normal limits  CBG MONITORING, ED - Abnormal; Notable for the following components:   Glucose-Capillary 136 (*)    All other components within normal limits  TROPONIN I (HIGH SENSITIVITY) - Abnormal; Notable for the following components:   Troponin I (High Sensitivity) 164 (*)    All other components within normal limits  TROPONIN I (HIGH SENSITIVITY) - Abnormal; Notable for the following components:   Troponin I (High Sensitivity) 178 (*)    All other components within normal limits  SEDIMENTATION RATE  URINALYSIS, ROUTINE W REFLEX MICROSCOPIC  C-REACTIVE PROTEIN  RAPID URINE DRUG SCREEN, HOSP PERFORMED    EKG EKG Interpretation  Date/Time:  Wednesday November 01 2021 08:26:29 EDT Ventricular Rate:  68 PR Interval:  143 QRS Duration: 101 QT Interval:  462 QTC Calculation: 492 R Axis:   -37 Text Interpretation: Sinus rhythm Multiform ventricular premature complexes Abnormal R-wave progression, early transition Left ventricular hypertrophy Abnrm T, consider ischemia, anterolateral lds increased ectopy from prior yesterday Confirmed by Aletta Edouard [phone]) on 11/01/2021 8:33:44 AM  Radiology DG Chest Portable 1 View  Result Date: 11/01/2021 CLINICAL DATA:  A 71 year old male presents for evaluation of weakness. EXAM: PORTABLE CHEST 1 VIEW COMPARISON:  October 31, 2021. FINDINGS: EKG leads project over the chest. Lung volumes remain low. Accounting for this cardiomediastinal contours and hilar structures are normal. Juxta diaphragmatic airspace disease persists at the lung bases. No visible pneumothorax. No gross pleural effusion. On  limited assessment there is no acute skeletal process. IMPRESSION: Low lung volumes with persistent bibasilar airspace disease. Electronically Signed   By: Zetta Bills M.D.   On: 11/01/2021 09:42   CT Head Wo Contrast  Result Date: 10/31/2021 CLINICAL DATA:  Mental status change, unknown cause EXAM: CT HEAD WITHOUT CONTRAST TECHNIQUE: Contiguous axial images were obtained from the base of the skull through the vertex without intravenous contrast. RADIATION DOSE REDUCTION: This exam was performed according to the departmental dose-optimization program which includes automated exposure control, adjustment of the mA and/or kV according to patient size and/or use of iterative reconstruction technique. COMPARISON:  04/21/2021 FINDINGS: Brain: Normal anatomic configuration. Parenchymal volume loss is commensurate with the  patient's age. Stable moderate periventricular white matter changes are present likely reflecting the sequela of small vessel ischemia. Remote bilateral periventricular, bilateral thalamic, right insular and left basal ganglia lacunar infarcts are again noted. No abnormal intra or extra-axial mass lesion or fluid collection. No abnormal mass effect or midline shift. No evidence of acute intracranial hemorrhage or infarct. Ventricular size is normal. Cerebellum unremarkable. Vascular: No asymmetric hyperdense vasculature at the skull base. Skull: Intact Sinuses/Orbits: Paranasal sinuses are clear. Orbits are unremarkable. Other: Mastoid air cells and middle ear cavities are clear. IMPRESSION: 1. No acute intracranial abnormality. 2. Stable moderate periventricular white matter disease, likely reflecting the sequela of small vessel ischemia. 3. Remote bilateral lacunar infarcts. Electronically Signed   By: Fidela Salisbury M.D.   On: 10/31/2021 18:28   DG Chest Portable 1 View  Result Date: 10/31/2021 CLINICAL DATA:  Weakness. EXAM: PORTABLE CHEST 1 VIEW COMPARISON:  AP chest 09/27/2020 and  09/04/2020 FINDINGS: Cardiac silhouette is again at the upper limits of normal size for AP technique. Mild calcification within aortic arch. There is again moderate elevation of the left hemidiaphragm with left greater than right basilar bronchovascular crowding. No focal airspace opacity. No pulmonary edema pleural effusion or pneumothorax. Mild dextrocurvature of the mid to lower thoracic spine. IMPRESSION: Chronic elevation of the left hemidiaphragm.  No acute lung process. Electronically Signed   By: Yvonne Kendall M.D.   On: 10/31/2021 18:07    Procedures Procedures    Medications Ordered in ED Medications  aspirin tablet 325 mg (325 mg Oral Given 11/01/21 1137)  nitroGLYCERIN (NITROSTAT) SL tablet 0.4 mg (has no administration in time range)    ED Course/ Medical Decision Making/ A&P Clinical Course as of 11/01/21 1305  Wed Nov 01, 2021  1100 Troponin I (High Sensitivity)(!!): 164 [JL]  1218 Troponin I (High Sensitivity)(!!): 178 [JL]    Clinical Course User Index [JL] Regan Lemming, MD                           Medical Decision Making Amount and/or Complexity of Data Reviewed Labs: ordered. Decision-making details documented in ED Course. Radiology: ordered.  Risk OTC drugs. Prescription drug management.    71 year old male with a history of CVA with no residual deficits, cutaneous lupus, HLD, anemia, presenting to the emergency department with a chief complaint of generalized weakness.  The patient was seen at the [facility] yesterday and left AMA after initial work-up revealed an elevated troponin to 161.  Per chart review, the patient presented for generalized weakness for the last several days.  He had denied any stroke symptoms, had a negative CT head and had no focal neurologic deficits.  He had denied any symptoms of ACS.  Had been noncompliant with his antihypertensives.  It been recommended that the patient be admitted to the hospital for cardiac work-up  with the patient left AMA.  He presents to the emergency department today with persistent symptoms.  He is states that nothing has changed but continues to feel generalized weakness and fatigue.  He denies any chest pain or shortness of breath, fevers or chills.  He denies any bilateral lower extremity swelling.  He denies any recent drug use.  He denies any new focal neurologic deficits.  On arrival, the patient was afebrile, hemodynamically stable, mildly hypertensive BP 170/91, not tachycardic or tachypneic, saturating 100% room air, normal sinus rhythm noted on cardiac telemetry.  Physical exam generally unremarkable  with no focal neurologic deficits on exam, lungs clear to auscultation bilaterally, no abdominal tenderness, no lower extremity edema.  Patient does have a systolic murmur on exam.  Cardiac work-up significant for EKG with sinus rhythm, ventricular rate 68, PVCs present, no STEMI, LVH with abnormal T waves present.  Chest x-ray was performed and revealed no acute lung process.  Cardiac troponins were obtained with initial elevation noted to 164, repeat elevated patient noted to 178.  The patient's renal function appears to be at baseline.  Given the patient's ongoing elevated troponin and generalized weakness, concern for ongoing coronary ischemia.  Recommended inpatient admission for further cardiac work-up.  After discussing findings with the patient, the patient declined admission and requested to leave AMA.  I believe the patient has capacity to make this decision.  I explained the risks and benefits of leaving [place] to include development of heart failure, worsening of his condition and ultimately death.  The patient endorsed understanding and still requested to leave the emergency department.  Encouraged the patient to follow-up with his PCP and cardiologist.  He appears to have been a no-show to multiple cardiology appointments recently.  The previous ED provider  yesterday has reached out to his cardiology team to help arrange for outpatient follow-up.   Final Clinical Impression(s) / ED Diagnoses Final diagnoses:  Weakness  Elevated troponin    Rx / DC Orders ED Discharge Orders     None         Regan Lemming, MD 11/01/21 1306

## 2021-11-08 ENCOUNTER — Other Ambulatory Visit: Payer: Self-pay

## 2021-11-08 DIAGNOSIS — F17219 Nicotine dependence, cigarettes, with unspecified nicotine-induced disorders: Secondary | ICD-10-CM

## 2021-11-15 ENCOUNTER — Emergency Department (HOSPITAL_COMMUNITY): Payer: Medicare Other

## 2021-11-15 ENCOUNTER — Emergency Department (HOSPITAL_COMMUNITY)
Admission: EM | Admit: 2021-11-15 | Discharge: 2021-11-16 | Discharge status: Against Medical Advice | Payer: Medicare Other | Attending: Emergency Medicine | Admitting: Emergency Medicine

## 2021-11-15 ENCOUNTER — Other Ambulatory Visit: Payer: Self-pay

## 2021-11-15 DIAGNOSIS — R531 Weakness: Secondary | ICD-10-CM | POA: Diagnosis present

## 2021-11-15 DIAGNOSIS — R269 Unspecified abnormalities of gait and mobility: Secondary | ICD-10-CM | POA: Insufficient documentation

## 2021-11-15 DIAGNOSIS — W19XXXA Unspecified fall, initial encounter: Secondary | ICD-10-CM | POA: Diagnosis not present

## 2021-11-15 DIAGNOSIS — Z5321 Procedure and treatment not carried out due to patient leaving prior to being seen by health care provider: Secondary | ICD-10-CM | POA: Insufficient documentation

## 2021-11-15 LAB — CBC
HCT: 45.2 % (ref 39.0–52.0)
Hemoglobin: 13.8 g/dL (ref 13.0–17.0)
MCH: 26.1 pg (ref 26.0–34.0)
MCHC: 30.5 g/dL (ref 30.0–36.0)
MCV: 85.6 fL (ref 80.0–100.0)
Platelets: 171 10*3/uL (ref 150–400)
RBC: 5.28 MIL/uL (ref 4.22–5.81)
RDW: 15.8 % — ABNORMAL HIGH (ref 11.5–15.5)
WBC: 4.6 10*3/uL (ref 4.0–10.5)
nRBC: 0 % (ref 0.0–0.2)

## 2021-11-15 LAB — BASIC METABOLIC PANEL
Anion gap: 9 (ref 5–15)
BUN: 25 mg/dL — ABNORMAL HIGH (ref 8–23)
CO2: 25 mmol/L (ref 22–32)
Calcium: 9 mg/dL (ref 8.9–10.3)
Chloride: 107 mmol/L (ref 98–111)
Creatinine, Ser: 1.66 mg/dL — ABNORMAL HIGH (ref 0.61–1.24)
GFR, Estimated: 44 mL/min — ABNORMAL LOW (ref >60)
Glucose, Bld: 135 mg/dL — ABNORMAL HIGH (ref 70–99)
Potassium: 3.6 mmol/L (ref 3.5–5.1)
Sodium: 141 mmol/L (ref 135–145)

## 2021-11-15 NOTE — ED Provider Triage Note (Signed)
Emergency Medicine Provider Triage Evaluation Note  AMIER HOYT , a 71 y.o. male  was evaluated in triage.  Pt complains of weakness. Report legs gave out on him 3 days ago causing him to fall.  He has been dealing with weakness and gait instability since.  No headache, vision changes, cp, sob, abd pain, neck pain. Denies alcohol or tobacco abuse  Review of Systems  Positive: As above Negative: As above  Physical Exam  BP (!) 176/92   Pulse 76   Temp 98 F (36.7 C)   Resp 18   SpO2 94%  Gen:   Awake, no distress   Resp:  Normal effort  MSK:   Moves extremities without difficulty  Other:    Medical Decision Making  Medically screening exam initiated at 6:29 PM.  Appropriate orders placed.  Marcques Wrightsman Stoll was informed that the remainder of the evaluation will be completed by another provider, this initial triage assessment does not replace that evaluation, and the importance of remaining in the ED until their evaluation is complete.     Domenic Moras, PA-C 11/15/21 [phone]

## 2021-11-15 NOTE — ED Triage Notes (Signed)
Pt here with reports of falling on Monday due to gait abnormality. States since then he has not been steady on his feet. Pt also endorses generalized weakness.

## 2021-11-16 ENCOUNTER — Ambulatory Visit: Payer: Medicare Other

## 2021-11-23 NOTE — Progress Notes (Deleted)
Cardiology Clinic Note   Patient Name: Isaac Reyes [facility] Date of Encounter: 11/23/2021  Primary Care Provider:  Sonia Side., FNP Primary Cardiologist:  Minus Breeding, MD  Patient Profile    71 year old male with history of syncope, hypertension, history of CVA, last seen by Dr. Percival Spanish On 06/12/2021.  He was advised to have a pharmacologic stress Myoview, and a cardiac monitor both of which were not completed since time of last office visit.  Also ultrasound of his carotid arteries was planned which was not completed.  He was seen in the emergency room on 11/15/2021 after complaints of weakness, reporting that his "legs gave out on him" 3 days prior to ER visit causing him to fall.  Past Medical History    Past Medical History:  Diagnosis Date   Acute CVA (cerebrovascular accident) ([place]) 06/2017   "speech issues since" (07/24/2017)   AKI (acute kidney injury) ([place]) 07/24/2017   Anemia    High cholesterol    Lupus ([place])    "skin kind"   NSVT (nonsustained ventricular tachycardia) (HCC)    Past Surgical History:  Procedure Laterality Date   KNEE ARTHROSCOPY     TONSILLECTOMY      Allergies  No Known Allergies  History of Present Illness    ***  Home Medications    Current Outpatient Medications  Medication Sig Dispense Refill   acetaminophen (TYLENOL) 500 MG tablet Take 2 tablets (1,000 mg total) by mouth every 6 (six) hours as needed. 60 tablet 0   amLODipine (NORVASC) 10 MG tablet Take 1 tablet (10 mg total) by mouth daily. (Patient taking differently: Take 10 mg by mouth every morning.) 30 tablet 0   aspirin EC 81 MG tablet Take 81 mg by mouth every morning. Swallow whole.     atorvastatin (LIPITOR) 80 MG tablet Take 80 mg by mouth daily.     losartan (COZAAR) 100 MG tablet Take 100 mg by mouth daily.     Multiple Vitamin (MULTIVITAMIN WITH MINERALS) TABS tablet Take 1 tablet by mouth every morning.     No current facility-administered medications for this  visit.     Family History    Family History  Problem Relation Age of Onset   Heart attack Father    He indicated that the status of his mother is unknown. He indicated that the status of his father is unknown.  Social History    Social History   Socioeconomic History   Marital status: Divorced    Spouse name: Not on file   Number of children: 2   Years of education: Not on file   Highest education level: Not on file  Occupational History   Not on file  Tobacco Use   Smoking status: Former    Packs/day: 0.12    Years: 45.00    Total pack years: 5.40    Types: Cigars, Cigarettes    Quit date: 10/19/2016    Years since quitting: 5.0   Smokeless tobacco: Never  Vaping Use   Vaping Use: Never used  Substance and Sexual Activity   Alcohol use: Yes    Alcohol/week: 3.0 standard drinks of alcohol    Types: 3 Standard drinks or equivalent per week   Drug use: No   Sexual activity: Never  Other Topics Concern   Not on file  Social History Narrative   Not on file   Social Determinants of Health   Financial Resource Strain: Not on file  Food Insecurity:  Not on file  Transportation Needs: Not on file  Physical Activity: Not on file  Stress: Not on file  Social Connections: Not on file  Intimate Partner Violence: Not on file     Review of Systems    General:  No chills, fever, night sweats or weight changes.  Cardiovascular:  No chest pain, dyspnea on exertion, edema, orthopnea, palpitations, paroxysmal nocturnal dyspnea. Dermatological: No rash, lesions/masses Respiratory: No cough, dyspnea Urologic: No hematuria, dysuria Abdominal:   No nausea, vomiting, diarrhea, bright red blood per rectum, melena, or hematemesis Neurologic:  No visual changes, wkns, changes in mental status. All other systems reviewed and are otherwise negative except as noted above.     Physical Exam    VS:  There were no vitals taken for this visit. , BMI There is no height or weight on  file to calculate BMI.     GEN: Well nourished, well developed, in no acute distress. HEENT: normal. Neck: Supple, no JVD, carotid bruits, or masses. Cardiac: RRR, no murmurs, rubs, or gallops. No clubbing, cyanosis, edema.  Radials/DP/PT 2+ and equal bilaterally.  Respiratory:  Respirations regular and unlabored, clear to auscultation bilaterally. GI: Soft, nontender, nondistended, BS + x 4. MS: no deformity or atrophy. Skin: warm and dry, no rash. Neuro:  Strength and sensation are intact. Psych: Normal affect.  Accessory Clinical Findings    ECG personally reviewed by me today- *** - No acute changes  Lab Results  Component Value Date   WBC 4.6 11/15/2021   HGB 13.8 11/15/2021   HCT 45.2 11/15/2021   MCV 85.6 11/15/2021   PLT 171 11/15/2021   Lab Results  Component Value Date   CREATININE 1.66 (H) 11/15/2021   BUN 25 (H) 11/15/2021   NA 141 11/15/2021   K 3.6 11/15/2021   CL 107 11/15/2021   CO2 25 11/15/2021   Lab Results  Component Value Date   ALT 22 10/31/2021   AST 26 10/31/2021   ALKPHOS 90 10/31/2021   BILITOT 1.1 10/31/2021   Lab Results  Component Value Date   CHOL 148 07/17/2018   HDL 58 07/17/2018   LDLCALC 82 07/17/2018   TRIG 39 07/17/2018   CHOLHDL 2.6 07/17/2018    Lab Results  Component Value Date   HGBA1C 5.6 07/17/2018    Review of Prior Studies: Echocardiogram 07/02/2020 1. Very limited study due to poor acoustic windows. Based on limited  views, the left ventricular ejection fraction, by estimation, is 55 to  60%. The left ventricle has normal function. Left ventricular endocardial  border not optimally defined to  evaluate regional wall motion. There is mild concentric left ventricular  hypertrophy. Left ventricular diastolic parameters are consistent with  Grade I diastolic dysfunction (impaired relaxation).   2. Right ventricular systolic function was not well visualized. The right  ventricular size appears normal based on  limited views.   3. The mitral valve is normal in structure. Trivial mitral valve  regurgitation.   4. The aortic valve was not well visualized. Aortic valve regurgitation  is not visualized. Mild aortic valve sclerosis is present, with no  evidence of aortic valve stenosis.   5. The inferior vena cava is normal in size with <50% respiratory  variability, suggesting right atrial pressure of 8 mmHg.   Carotid Doppler Studies 11/08/2017 Final Interpretation:  Right Carotid: Velocities in the right ICA are consistent with a 1-39%  stenosis.   Left Carotid: Velocities in the left ICA are consistent with a  1-39%  stenosis.   Vertebrals:  Bilateral vertebral arteries demonstrate antegrade flow.  Subclavians: Normal flow hemodynamics were seen in bilateral subclavian               arteries.   Assessment & Plan   1.  ***    Current medicines are reviewed at length with the patient today.  I have spent *** min's  dedicated to the care of this patient on the date of this encounter to include pre-visit review of records, assessment, management and diagnostic testing,with shared decision making. Signed, Phill Myron. West Pugh, ANP, AACC   11/23/2021 4:31 PM    [address] Office [phone] Fax [phone]  Notice: This dictation was prepared with Dragon dictation along with smaller phrase technology. Any transcriptional errors that result from this process are unintentional and may not be corrected upon review.

## 2021-11-24 ENCOUNTER — Ambulatory Visit: Payer: Medicare Other | Admitting: Adult Health

## 2021-12-01 ENCOUNTER — Encounter: Payer: Self-pay | Admitting: Adult Health

## 2022-01-22 NOTE — Progress Notes (Signed)
Cardiology Office Note   Date:  01/23/2022   ID:  Isaac Reyes, DOB April 20, 1951, MRN [id number]  PCP:  Sonia Side., FNP  Cardiologist:   Minus Breeding, MD   Chief Complaint  Patient presents with   Dizziness      History of Present Illness: Isaac Reyes is a 71 y.o. male who was in the hospital with syncope.   I reviewed these records for this visit.    We had seen him in hte hospital in Feb of last year for syncope.  He has had this in the past as well.   He had previously been scheduled to get a ILR but he did not follow up for this.   He was to have a stress test for an abnormal EKG and a four week monitor.   He did not present for either.  He has been a no show for carotid Doppler in the past as well.   I do see that since I saw him he was in the emergency room in June with some weakness.  I reviewed these records.  Negative head CT.  He left hospital AMA.    Of note in June he did have a mildly elevated troponin of 164.  He did come back today for his appointment with his friend.  He is not taking any of his medicines.  He has not had any further presyncope since that time.  He denies any chest pressure, neck or arm discomfort.  He has had no new palpitations.  He did have a fall because he supposed be walking with a cane.  He is now working with physical therapy.       Past Medical History:  Diagnosis Date   Acute CVA (cerebrovascular accident) ([place]) 06/2017   "speech issues since" (07/24/2017)   AKI (acute kidney injury) ([place]) 07/24/2017   Anemia    High cholesterol    Lupus ([place])    "skin kind"   NSVT (nonsustained ventricular tachycardia) ([facility])     Past Surgical History:  Procedure Laterality Date   KNEE ARTHROSCOPY     TONSILLECTOMY       Current Outpatient Medications  Medication Sig Dispense Refill   acetaminophen (TYLENOL) 500 MG tablet Take 2 tablets (1,000 mg total) by mouth every 6 (six) hours as needed. 60 tablet 0   amLODipine  (NORVASC) 10 MG tablet Take 1 tablet (10 mg total) by mouth daily. (Patient taking differently: Take 10 mg by mouth every morning.) 30 tablet 0   aspirin EC 81 MG tablet Take 81 mg by mouth every morning. Swallow whole.     atorvastatin (LIPITOR) 80 MG tablet Take 80 mg by mouth daily.     Multiple Vitamin (MULTIVITAMIN WITH MINERALS) TABS tablet Take 1 tablet by mouth every morning.     losartan (COZAAR) 100 MG tablet Take 100 mg by mouth daily. (Patient not taking: Reported on 01/23/2022)     No current facility-administered medications for this visit.    Allergies:   Patient has no known allergies.   ROS:  Please see the history of present illness.   Otherwise, review of systems are positive for none.   All other systems are reviewed and negative.    PHYSICAL EXAM: VS:  BP (!) 154/96   Pulse 79   Ht 6\' 2"  (1.88 m)   Wt 217 lb 12.8 oz (98.8 kg)   SpO2 99%   BMI 27.96 kg/m  ,  BMI Body mass index is 27.96 kg/m.   GENERAL:  Well appearing NECK:  No jugular venous distention, waveform within normal limits, carotid upstroke brisk and symmetric, no bruits, no thyromegaly LUNGS:  Clear to auscultation bilaterally CHEST:  Unremarkable HEART:  PMI not displaced or sustained,S1 and S2 within normal limits, no S3, no S4, no clicks, no rubs, no murmurs ABD:  Flat, positive bowel sounds normal in frequency in pitch, no bruits, no rebound, no guarding, no midline pulsatile mass, no hepatomegaly, no splenomegaly EXT:  2 plus pulses throughout, no edema, no cyanosis no clubbing   EKG:  EKG is  ordered today. The ekg ordered today demonstrates normal sinus rhythm, rate 79, left axis deviation, anterolateral T wave inversions cannot exclude ischemia unchanged from the emergency room EKG.   Recent Labs: 10/31/2021: ALT 22 11/01/2021: Magnesium 2.5 11/15/2021: BUN 25; Creatinine, Ser 1.66; Hemoglobin 13.8; Platelets 171; Potassium 3.6; Sodium 141    Lipid Panel    Component Value Date/Time    CHOL 148 07/17/2018 0559   TRIG 39 07/17/2018 0559   HDL 58 07/17/2018 0559   CHOLHDL 2.6 07/17/2018 0559   VLDL 8 07/17/2018 0559   LDLCALC 82 07/17/2018 0559      Wt Readings from Last 3 Encounters:  01/23/22 217 lb 12.8 oz (98.8 kg)  06/12/21 219 lb 9.6 oz (99.6 kg)  04/21/21 214 lb 11.7 oz (97.4 kg)      Other studies Reviewed: Additional studies/ records that were reviewed today include: ED records Review of the above records demonstrates:  Please see elsewhere in the note.     ASSESSMENT AND PLAN:  Syncope:   He has had no further presyncope.  No further work-up is planned.  HTN:   I think I have convinced him to take at least his 10 mg of amlodipine.  Michela Pitcher he was not taking it because he would feel dizzy.  We talked about the risk of stroke.  For now we will hold off on restarting the Cozaar.  History of CVA: He has not presented for any of the scheduled outpatient imaging including carotid Dopplers.  I am not going to try to reschedule these at this point as he has been a no-show 100% for procedures.  He when I will continue to talk about this.  Elevated troponin: The patient denies any symptoms.  He is not having any chest pressure.  He did not present and did not want to have a stress perfusion study.  I will try to continue with risk reduction.  Risk reduction: I tried to convince him to restart his statin.  Current medicines are reviewed at length with the patient today.  The patient does not have concerns regarding medicines.  The following changes have been made: As above  Labs/ tests ordered today include:   Orders Placed This Encounter  Procedures   EKG 12-Lead     Disposition:   FU with APP in 6 months   Signed, Minus Breeding, MD  01/23/2022 5:18 PM    [facility]

## 2022-01-23 ENCOUNTER — Ambulatory Visit: Payer: Medicare Other | Attending: Cardiology | Admitting: Cardiology

## 2022-01-23 ENCOUNTER — Encounter: Payer: Self-pay | Admitting: Cardiology

## 2022-01-23 VITALS — BP 154/96 | HR 79 | Ht 74.0 in | Wt 217.8 lb

## 2022-01-23 DIAGNOSIS — R55 Syncope and collapse: Secondary | ICD-10-CM | POA: Diagnosis not present

## 2022-01-23 NOTE — Patient Instructions (Signed)
Medication Instructions:  TAKE the Amlodipine 10 mg once daily  *If you need a refill on your cardiac medications before your next appointment, please call your pharmacy*   Lab Work: None ordered If you have labs (blood work) drawn today and your tests are completely normal, you will receive your results only by: [place] (if you have MyChart) OR A paper copy in the mail If you have any lab test that is abnormal or we need to change your treatment, we will call you to review the results.   Testing/Procedures: None ordered   Follow-Up: At [facility], you and your health needs are our priority.  As part of our continuing mission to provide you with exceptional heart care, we have created designated Provider Care Teams.  These Care Teams include your primary Cardiologist (physician) and Advanced Practice Providers (APPs -  Physician Assistants and Nurse Practitioners) who all work together to provide you with the care you need, when you need it.  We recommend signing up for the patient portal called "MyChart".  Sign up information is provided on this After Visit Summary.  MyChart is used to connect with patients for Virtual Visits (Telemedicine).  Patients are able to view lab/test results, encounter notes, upcoming appointments, etc.  Non-urgent messages can be sent to your provider as well.   To learn more about what you can do with MyChart, go to [email or web address].    Your next appointment:   6 month(s)  The format for your next appointment:   In Person  Provider:   APP  Important Information About Sugar

## 2022-11-21 ENCOUNTER — Ambulatory Visit: Payer: 59 | Admitting: Urology

## 2022-11-21 NOTE — Progress Notes (Deleted)
   Assessment: No diagnosis found.   Plan: ***  Chief Complaint: No chief complaint on file.   History of Present Illness:  Isaac Reyes is a 72 y.o. male who is seen in consultation from Raymon Mutton., FNP for evaluation of elevated psa.   Past Medical History:  Past Medical History:  Diagnosis Date   Acute CVA (cerebrovascular accident) ([facility]) 06/2017   "speech issues since" (07/24/2017)   AKI (acute kidney injury) ([facility]) 07/24/2017   Anemia    High cholesterol    Lupus ([facility])    "skin kind"   NSVT (nonsustained ventricular tachycardia) ([facility])     Past Surgical History:  Past Surgical History:  Procedure Laterality Date   KNEE ARTHROSCOPY     TONSILLECTOMY      Allergies:  No Known Allergies  Family History:  Family History  Problem Relation Age of Onset   Heart attack Father     Social History:  Social History   Tobacco Use   Smoking status: Former    Packs/day: 0.12    Years: 45.00    Additional pack years: 0.00    Total pack years: 5.40    Types: Cigars, Cigarettes    Quit date: 10/19/2016    Years since quitting: 6.0   Smokeless tobacco: Never  Vaping Use   Vaping Use: Never used  Substance Use Topics   Alcohol use: Yes    Alcohol/week: 3.0 standard drinks of alcohol    Types: 3 Standard drinks or equivalent per week   Drug use: No    Review of symptoms:  Constitutional:  Negative for unexplained weight loss, night sweats, fever, chills ENT:  Negative for nose bleeds, sinus pain, painful swallowing CV:  Negative for chest pain, shortness of breath, exercise intolerance, palpitations, loss of consciousness Resp:  Negative for cough, wheezing, shortness of breath GI:  Negative for nausea, vomiting, diarrhea, bloody stools GU:  Positives noted in HPI; otherwise negative for gross hematuria, dysuria, urinary incontinence Neuro:  Negative for seizures, poor balance, limb weakness, slurred speech Psych:  Negative for lack of energy,  depression, anxiety Endocrine:  Negative for polydipsia, polyuria, symptoms of hypoglycemia (dizziness, hunger, sweating) Hematologic:  Negative for anemia, purpura, petechia, prolonged or excessive bleeding, use of anticoagulants  Allergic:  Negative for difficulty breathing or choking as a result of exposure to anything; no shellfish allergy; no allergic response (rash/itch) to materials, foods  Physical exam: There were no vitals taken for this visit. GENERAL APPEARANCE:  Well appearing, well developed, well nourished, NAD HEENT: Atraumatic, Normocephalic, oropharynx clear. NECK: Supple without lymphadenopathy or thyromegaly. LUNGS: Clear to auscultation bilaterally. HEART: Regular Rate and Rhythm without murmurs, gallops, or rubs. ABDOMEN: Soft, non-tender, No Masses. EXTREMITIES: Moves all extremities well.  Without clubbing, cyanosis, or edema. NEUROLOGIC:  Alert and oriented x 3, normal gait, CN II-XII grossly intact.  MENTAL STATUS:  Appropriate. BACK:  Non-tender to palpation.  No CVAT SKIN:  Warm, dry and intact.    Results: No results found for this or any previous visit (from the past 24 hour(s)).

## 2023-01-20 ENCOUNTER — Other Ambulatory Visit: Payer: Self-pay

## 2023-01-20 ENCOUNTER — Emergency Department (HOSPITAL_COMMUNITY)
Admission: EM | Admit: 2023-01-20 | Discharge: 2023-01-20 | Discharge status: Against Medical Advice | Payer: 59 | Attending: Emergency Medicine | Admitting: Emergency Medicine

## 2023-01-20 DIAGNOSIS — R531 Weakness: Secondary | ICD-10-CM | POA: Insufficient documentation

## 2023-01-20 DIAGNOSIS — Z8673 Personal history of transient ischemic attack (TIA), and cerebral infarction without residual deficits: Secondary | ICD-10-CM | POA: Diagnosis not present

## 2023-01-20 DIAGNOSIS — Z5321 Procedure and treatment not carried out due to patient leaving prior to being seen by health care provider: Secondary | ICD-10-CM | POA: Insufficient documentation

## 2023-01-20 NOTE — ED Triage Notes (Signed)
Pt BIBA from home. C/o increasing weakness over past several days. Pt had trouble walking today d/t weakness.  Pt has hx stroke w/L sided deficits.   AOx4

## 2023-02-18 ENCOUNTER — Emergency Department (HOSPITAL_COMMUNITY): Payer: 59

## 2023-02-18 ENCOUNTER — Other Ambulatory Visit: Payer: Self-pay

## 2023-02-18 ENCOUNTER — Encounter (HOSPITAL_COMMUNITY): Payer: Self-pay

## 2023-02-18 ENCOUNTER — Inpatient Hospital Stay (HOSPITAL_COMMUNITY)
Admission: EM | Admit: 2023-02-18 | Discharge: 2023-02-22 | DRG: 066 | Disposition: A | Discharge status: Rehabilitation Facility | Payer: 59 | Attending: Student in an Organized Health Care Education/Training Program | Admitting: Student in an Organized Health Care Education/Training Program

## 2023-02-18 DIAGNOSIS — R296 Repeated falls: Secondary | ICD-10-CM | POA: Diagnosis present

## 2023-02-18 DIAGNOSIS — E7800 Pure hypercholesterolemia, unspecified: Secondary | ICD-10-CM | POA: Diagnosis present

## 2023-02-18 DIAGNOSIS — Z91148 Patient's other noncompliance with medication regimen for other reason: Secondary | ICD-10-CM

## 2023-02-18 DIAGNOSIS — I639 Cerebral infarction, unspecified: Secondary | ICD-10-CM | POA: Diagnosis present

## 2023-02-18 DIAGNOSIS — L930 Discoid lupus erythematosus: Secondary | ICD-10-CM | POA: Diagnosis present

## 2023-02-18 DIAGNOSIS — R29810 Facial weakness: Secondary | ICD-10-CM | POA: Diagnosis present

## 2023-02-18 DIAGNOSIS — R569 Unspecified convulsions: Principal | ICD-10-CM

## 2023-02-18 DIAGNOSIS — R000 Tachycardia, unspecified: Secondary | ICD-10-CM | POA: Diagnosis present

## 2023-02-18 DIAGNOSIS — R509 Fever, unspecified: Secondary | ICD-10-CM | POA: Diagnosis present

## 2023-02-18 DIAGNOSIS — I10 Essential (primary) hypertension: Secondary | ICD-10-CM | POA: Diagnosis present

## 2023-02-18 DIAGNOSIS — I69322 Dysarthria following cerebral infarction: Secondary | ICD-10-CM

## 2023-02-18 DIAGNOSIS — I119 Hypertensive heart disease without heart failure: Secondary | ICD-10-CM | POA: Diagnosis present

## 2023-02-18 DIAGNOSIS — Z7902 Long term (current) use of antithrombotics/antiplatelets: Secondary | ICD-10-CM

## 2023-02-18 DIAGNOSIS — F1410 Cocaine abuse, uncomplicated: Secondary | ICD-10-CM | POA: Diagnosis present

## 2023-02-18 DIAGNOSIS — R251 Tremor, unspecified: Secondary | ICD-10-CM | POA: Diagnosis present

## 2023-02-18 DIAGNOSIS — R29702 NIHSS score 2: Secondary | ICD-10-CM | POA: Diagnosis present

## 2023-02-18 DIAGNOSIS — F17210 Nicotine dependence, cigarettes, uncomplicated: Secondary | ICD-10-CM | POA: Diagnosis present

## 2023-02-18 DIAGNOSIS — F1411 Cocaine abuse, in remission: Secondary | ICD-10-CM | POA: Diagnosis present

## 2023-02-18 DIAGNOSIS — I6381 Other cerebral infarction due to occlusion or stenosis of small artery: Principal | ICD-10-CM | POA: Diagnosis present

## 2023-02-18 DIAGNOSIS — Z7982 Long term (current) use of aspirin: Secondary | ICD-10-CM

## 2023-02-18 DIAGNOSIS — Z79899 Other long term (current) drug therapy: Secondary | ICD-10-CM

## 2023-02-18 DIAGNOSIS — Z716 Tobacco abuse counseling: Secondary | ICD-10-CM

## 2023-02-18 DIAGNOSIS — Z66 Do not resuscitate: Secondary | ICD-10-CM | POA: Diagnosis present

## 2023-02-18 DIAGNOSIS — Z8249 Family history of ischemic heart disease and other diseases of the circulatory system: Secondary | ICD-10-CM

## 2023-02-18 DIAGNOSIS — D649 Anemia, unspecified: Secondary | ICD-10-CM | POA: Diagnosis present

## 2023-02-18 HISTORY — DX: Other psychoactive substance use, unspecified, uncomplicated: F19.90

## 2023-02-18 LAB — CBC WITH DIFFERENTIAL/PLATELET
Abs Immature Granulocytes: 0.02 10*3/uL (ref 0.00–0.07)
Basophils Absolute: 0 10*3/uL (ref 0.0–0.1)
Basophils Relative: 1 %
Eosinophils Absolute: 0 10*3/uL (ref 0.0–0.5)
Eosinophils Relative: 0 %
HCT: 43.4 % (ref 39.0–52.0)
Hemoglobin: 13.2 g/dL (ref 13.0–17.0)
Immature Granulocytes: 0 %
Lymphocytes Relative: 11 %
Lymphs Abs: 0.6 10*3/uL — ABNORMAL LOW (ref 0.7–4.0)
MCH: 25.9 pg — ABNORMAL LOW (ref 26.0–34.0)
MCHC: 30.4 g/dL (ref 30.0–36.0)
MCV: 85.1 fL (ref 80.0–100.0)
Monocytes Absolute: 0.4 10*3/uL (ref 0.1–1.0)
Monocytes Relative: 7 %
Neutro Abs: 4.6 10*3/uL (ref 1.7–7.7)
Neutrophils Relative %: 81 %
Platelets: 126 10*3/uL — ABNORMAL LOW (ref 150–400)
RBC: 5.1 MIL/uL (ref 4.22–5.81)
RDW: 16.4 % — ABNORMAL HIGH (ref 11.5–15.5)
WBC: 5.7 10*3/uL (ref 4.0–10.5)
nRBC: 0 % (ref 0.0–0.2)

## 2023-02-18 LAB — URINALYSIS, ROUTINE W REFLEX MICROSCOPIC
Bilirubin Urine: NEGATIVE
Glucose, UA: NEGATIVE mg/dL
Hgb urine dipstick: NEGATIVE
Ketones, ur: NEGATIVE mg/dL
Leukocytes,Ua: NEGATIVE
Nitrite: NEGATIVE
Protein, ur: NEGATIVE mg/dL
Specific Gravity, Urine: 1.009 (ref 1.005–1.030)
pH: 7 (ref 5.0–8.0)

## 2023-02-18 LAB — COMPREHENSIVE METABOLIC PANEL
ALT: 26 U/L (ref 0–44)
AST: 24 U/L (ref 15–41)
Albumin: 3.2 g/dL — ABNORMAL LOW (ref 3.5–5.0)
Alkaline Phosphatase: 100 U/L (ref 38–126)
Anion gap: 11 (ref 5–15)
BUN: 17 mg/dL (ref 8–23)
CO2: 22 mmol/L (ref 22–32)
Calcium: 8.1 mg/dL — ABNORMAL LOW (ref 8.9–10.3)
Chloride: 104 mmol/L (ref 98–111)
Creatinine, Ser: 1.43 mg/dL — ABNORMAL HIGH (ref 0.61–1.24)
GFR, Estimated: 52 mL/min — ABNORMAL LOW (ref >60)
Glucose, Bld: 160 mg/dL — ABNORMAL HIGH (ref 70–99)
Potassium: 3.2 mmol/L — ABNORMAL LOW (ref 3.5–5.1)
Sodium: 137 mmol/L (ref 135–145)
Total Bilirubin: 0.7 mg/dL (ref 0.3–1.2)
Total Protein: 7 g/dL (ref 6.5–8.1)

## 2023-02-18 LAB — I-STAT CG4 LACTIC ACID, ED: Lactic Acid, Venous: 2.1 mmol/L (ref 0.5–1.9)

## 2023-02-18 LAB — CBG MONITORING, ED: Glucose-Capillary: 147 mg/dL — ABNORMAL HIGH (ref 70–99)

## 2023-02-18 LAB — ETHANOL: Alcohol, Ethyl (B): <10 mg/dL (ref <10)

## 2023-02-18 MED ORDER — SODIUM CHLORIDE 0.9 % IV SOLN: INTRAVENOUS | Status: DC

## 2023-02-18 MED ORDER — SODIUM CHLORIDE 0.9 % IV BOLUS
1000.0000 mL | Freq: Once | INTRAVENOUS | Status: AC
  Administered 2023-02-18: 1000 mL via INTRAVENOUS

## 2023-02-18 NOTE — ED Notes (Signed)
Patient returned from CT at this time.

## 2023-02-18 NOTE — ED Provider Notes (Signed)
Isaac Reyes EMERGENCY DEPARTMENT AT [facility] Provider Note   CSN: [id number] Arrival date & time: 02/18/23  1714     History  Chief Complaint  Patient presents with   Seizures    Isaac Reyes is a 72 y.o. male.  HPI EMS brings the patient to the ED.  History is obtained by those individuals and less of by the patient who seems to deny any current complaints, but is somewhat withdrawn on arrival. Reportedly patient was with roommates, when he was witnessed to have a 5 to 7-minute period of unresponsiveness with tremor.  Gradual recovering without full return to interactivity. No fall reported. Seemingly patient has no history of seizures.  There is some limitation secondary to acuity of condition level 5 caveat.    Home Medications Prior to Admission medications   Medication Sig Start Date End Date Taking? Authorizing Provider  acetaminophen (TYLENOL) 500 MG tablet Take 2 tablets (1,000 mg total) by mouth every 6 (six) hours as needed. 07/26/20   Arby Barrette, MD  amLODipine (NORVASC) 10 MG tablet Take 1 tablet (10 mg total) by mouth daily. Patient taking differently: Take 10 mg by mouth every morning. 07/27/17   Calvert Cantor, MD  aspirin EC 81 MG tablet Take 81 mg by mouth every morning. Swallow whole.    [provider]  atorvastatin (LIPITOR) 80 MG tablet Take 80 mg by mouth daily. 10/26/21   [provider]  losartan (COZAAR) 100 MG tablet Take 100 mg by mouth daily. Patient not taking: Reported on 01/23/2022 10/26/21   [provider]  Multiple Vitamin (MULTIVITAMIN WITH MINERALS) TABS tablet Take 1 tablet by mouth every morning.    [provider]      Allergies    Patient has no known allergies.    Review of Systems   Review of Systems  Unable to perform ROS: Acuity of condition    Physical Exam Updated Vital Signs BP (!) 172/104   Pulse 89   Temp (!) 97.4 F (36.3 C) (Oral)   Resp (!) 22   SpO2 100%  Physical  Exam Vitals and nursing note reviewed.  Constitutional:      General: He is not in acute distress.    Appearance: He is well-developed.  HENT:     Head: Normocephalic and atraumatic.  Eyes:     Conjunctiva/sclera: Conjunctivae normal.  Cardiovascular:     Rate and Rhythm: Normal rate and regular rhythm.  Pulmonary:     Effort: Pulmonary effort is normal. No respiratory distress.     Breath sounds: No stridor.  Abdominal:     General: There is no distension.  Skin:    General: Skin is warm and dry.  Neurological:     Mental Status: He is alert.     Comments: Initially the patient is withdrawn, but alert, seems to be moving his extremity spontaneously, but incomplete exam initially     ED Results / Procedures / Treatments   Labs (all labs ordered are listed, but only abnormal results are displayed) Labs Reviewed  COMPREHENSIVE METABOLIC PANEL - Abnormal; Notable for the following components:      Result Value   Potassium 3.2 (*)    Glucose, Bld 160 (*)    Creatinine, Ser 1.43 (*)    Calcium 8.1 (*)    Albumin 3.2 (*)    GFR, Estimated 52 (*)    All other components within normal limits  CBC WITH DIFFERENTIAL/PLATELET - Abnormal; Notable for  the following components:   MCH 25.9 (*)    RDW 16.4 (*)    Platelets 126 (*)    Lymphs Abs 0.6 (*)    All other components within normal limits  CBG MONITORING, ED - Abnormal; Notable for the following components:   Glucose-Capillary 147 (*)    All other components within normal limits  I-STAT CG4 LACTIC ACID, ED - Abnormal; Notable for the following components:   Lactic Acid, Venous 2.1 (*)    All other components within normal limits  URINALYSIS, ROUTINE W REFLEX MICROSCOPIC  ETHANOL    EKG EKG Interpretation Date/Time:  Monday February 18 2023 17:36:35 EDT Ventricular Rate:  87 PR Interval:  148 QRS Duration:  101 QT Interval:  394 QTC Calculation: 474 R Axis:   -40  Text Interpretation: Sinus rhythm Abnormal  R-wave progression, early transition LVH with secondary repolarization abnormality Baseline wander in lead(s) V4 V6 Confirmed by Gerhard Munch [phone]) on 02/18/2023 5:37:53 PM  Radiology MR BRAIN WO CONTRAST  Result Date: 02/18/2023 CLINICAL DATA:  Dysarthria EXAM: MRI HEAD WITHOUT CONTRAST TECHNIQUE: Multiplanar, multiecho pulse sequences of the brain and surrounding structures were obtained without intravenous contrast. COMPARISON:  07/04/2020 FINDINGS: Brain: There is a punctate focus of abnormal diffusion restriction within the left corona radiata. There are numerous chronic microhemorrhages in a predominantly central distribution. There is confluent hyperintense T2-weighted signal within the periventricular and deep white matter. Mild volume loss. Multiple old infarcts of the deep white matter and deep gray nuclei. No hydrocephalus. The major midline structures are normal. Vascular: Normal flow voids. Skull and upper cervical spine: Normal bone marrow signal. Sinuses/Orbits: Paranasal sinuses are clear. No mastoid effusion. Normal orbits. Other: None IMPRESSION: 1. Punctate focus of acute ischemia within the left corona radiata. No hemorrhage or mass effect. 2. Multiple old infarcts of the deep white matter and deep gray nuclei. 3. Numerous chronic microhemorrhages in a predominantly central distribution, consistent with chronic hypertensive angiopathy. Electronically Signed   By: Deatra Robinson M.D.   On: 02/18/2023 23:18   CT HEAD WO CONTRAST  Result Date: 02/18/2023 CLINICAL DATA:  Altered mental status EXAM: CT HEAD WITHOUT CONTRAST TECHNIQUE: Contiguous axial images were obtained from the base of the skull through the vertex without intravenous contrast. RADIATION DOSE REDUCTION: This exam was performed according to the departmental dose-optimization program which includes automated exposure control, adjustment of the mA and/or kV according to patient size and/or use of iterative reconstruction  technique. COMPARISON:  11/15/2021 FINDINGS: Brain: No evidence of acute infarction, hemorrhage, hydrocephalus, extra-axial collection or mass lesion/mass effect. Chronic atrophic and ischemic changes are identified. Scattered lacunar infarcts are noted within the thalami and basal ganglia bilaterally. Vascular: No hyperdense vessel or unexpected calcification. Skull: Normal. Negative for fracture or focal lesion. Sinuses/Orbits: No acute finding. Other: None. IMPRESSION: Chronic atrophic and ischemic changes without acute abnormality. Electronically Signed   By: Alcide Clever M.D.   On: 02/18/2023 20:58   DG Chest Port 1 View  Result Date: 02/18/2023 CLINICAL DATA:  Altered level of consciousness EXAM: PORTABLE CHEST 1 VIEW COMPARISON:  11/01/2021 FINDINGS: Cardiac shadow is stable. Elevation of left hemidiaphragm is noted. No free air is seen. No focal infiltrate or effusion is noted. No bony abnormality noted. IMPRESSION: No acute abnormality noted. Electronically Signed   By: Alcide Clever M.D.   On: 02/18/2023 20:51    Procedures Procedures    Medications Ordered in ED Medications  sodium chloride 0.9 % bolus 1,000 mL (0 mLs  Intravenous Stopped 02/18/23 1918)    And  0.9 %  sodium chloride infusion ( Intravenous New Bag/Given 02/18/23 1800)    ED Course/ Medical Decision Making/ A&P                                 Medical Decision Making Adult male without known seizure history presents with altered mental status, now improving.  With unclear circumstances, suspicion for seizure versus syncope, with broad differential including cardiogenic, neurogenic, metabolic considered. Cardiac 95 sinus normal Pulse ox 98% room air normal   Amount and/or Complexity of Data Reviewed Independent Historian: EMS Labs: ordered. Decision-making details documented in ED Course. Radiology: ordered and independent interpretation performed. Decision-making details documented in ED Course. ECG/medicine  tests: ordered and independent interpretation performed. Decision-making details documented in ED Course.  Risk Prescription drug management. Decision regarding hospitalization.  Update: Patient now interactive, with some appreciable dysarthria.  He notes that since his prior stroke he has had weakness, unclear if he has had dysarthria.  In the context of new neuro change after possible seizure activity, head CT still pending, MRI added as well.  Stroke versus Todd's paralysis versus recrudescence considered.  11:27 PM MRI with evidence for acute ischemia in corona radiata, and given the patient's speech difficulty I discussed his case with our neurology team and they will follow as a consulting service for concern for possible new stroke in the context of the patient with prior stroke, possible seizure activity.  Patient will require admission for further monitoring, management.        Final Clinical Impression(s) / ED Diagnoses Final diagnoses:  Seizure-like activity (HCC)  Acute CVA (cerebrovascular accident) [facility])    Rx / DC Orders ED Discharge Orders     None         Gerhard Munch, MD 02/18/23 2338

## 2023-02-18 NOTE — ED Notes (Signed)
Patient provided with sandwich and soda.  Updated on plan of care

## 2023-02-18 NOTE — ED Triage Notes (Signed)
Patient arrived from home after friends called ems stating they witnessed patient having a seizure. When ems arrived patient alert and oriented, denies pain. Patient states I feel fine and I passed out. No incontinence, no tongue/mouth trauma

## 2023-02-19 ENCOUNTER — Inpatient Hospital Stay (HOSPITAL_COMMUNITY): Payer: 59

## 2023-02-19 ENCOUNTER — Encounter (HOSPITAL_COMMUNITY): Payer: Self-pay | Admitting: Student in an Organized Health Care Education/Training Program

## 2023-02-19 ENCOUNTER — Observation Stay (HOSPITAL_COMMUNITY): Payer: 59

## 2023-02-19 ENCOUNTER — Observation Stay (HOSPITAL_BASED_OUTPATIENT_CLINIC_OR_DEPARTMENT_OTHER): Payer: 59

## 2023-02-19 DIAGNOSIS — F17210 Nicotine dependence, cigarettes, uncomplicated: Secondary | ICD-10-CM | POA: Diagnosis present

## 2023-02-19 DIAGNOSIS — R569 Unspecified convulsions: Secondary | ICD-10-CM | POA: Diagnosis not present

## 2023-02-19 DIAGNOSIS — D649 Anemia, unspecified: Secondary | ICD-10-CM | POA: Diagnosis present

## 2023-02-19 DIAGNOSIS — R000 Tachycardia, unspecified: Secondary | ICD-10-CM | POA: Diagnosis present

## 2023-02-19 DIAGNOSIS — F1410 Cocaine abuse, uncomplicated: Secondary | ICD-10-CM | POA: Diagnosis present

## 2023-02-19 DIAGNOSIS — I6381 Other cerebral infarction due to occlusion or stenosis of small artery: Secondary | ICD-10-CM | POA: Diagnosis present

## 2023-02-19 DIAGNOSIS — Z7982 Long term (current) use of aspirin: Secondary | ICD-10-CM | POA: Diagnosis not present

## 2023-02-19 DIAGNOSIS — Z8249 Family history of ischemic heart disease and other diseases of the circulatory system: Secondary | ICD-10-CM | POA: Diagnosis not present

## 2023-02-19 DIAGNOSIS — I119 Hypertensive heart disease without heart failure: Secondary | ICD-10-CM | POA: Diagnosis present

## 2023-02-19 DIAGNOSIS — F1411 Cocaine abuse, in remission: Secondary | ICD-10-CM | POA: Diagnosis present

## 2023-02-19 DIAGNOSIS — R29702 NIHSS score 2: Secondary | ICD-10-CM | POA: Diagnosis present

## 2023-02-19 DIAGNOSIS — Z716 Tobacco abuse counseling: Secondary | ICD-10-CM | POA: Diagnosis not present

## 2023-02-19 DIAGNOSIS — I639 Cerebral infarction, unspecified: Secondary | ICD-10-CM | POA: Diagnosis present

## 2023-02-19 DIAGNOSIS — R296 Repeated falls: Secondary | ICD-10-CM | POA: Diagnosis present

## 2023-02-19 DIAGNOSIS — I10 Essential (primary) hypertension: Secondary | ICD-10-CM

## 2023-02-19 DIAGNOSIS — R55 Syncope and collapse: Secondary | ICD-10-CM | POA: Diagnosis not present

## 2023-02-19 DIAGNOSIS — R29810 Facial weakness: Secondary | ICD-10-CM | POA: Diagnosis present

## 2023-02-19 DIAGNOSIS — R251 Tremor, unspecified: Secondary | ICD-10-CM | POA: Diagnosis present

## 2023-02-19 DIAGNOSIS — Z66 Do not resuscitate: Secondary | ICD-10-CM | POA: Diagnosis present

## 2023-02-19 DIAGNOSIS — E7800 Pure hypercholesterolemia, unspecified: Secondary | ICD-10-CM | POA: Diagnosis present

## 2023-02-19 DIAGNOSIS — I69322 Dysarthria following cerebral infarction: Secondary | ICD-10-CM | POA: Diagnosis not present

## 2023-02-19 DIAGNOSIS — Z79899 Other long term (current) drug therapy: Secondary | ICD-10-CM | POA: Diagnosis not present

## 2023-02-19 DIAGNOSIS — R509 Fever, unspecified: Secondary | ICD-10-CM | POA: Diagnosis present

## 2023-02-19 DIAGNOSIS — L930 Discoid lupus erythematosus: Secondary | ICD-10-CM | POA: Diagnosis present

## 2023-02-19 DIAGNOSIS — Z91148 Patient's other noncompliance with medication regimen for other reason: Secondary | ICD-10-CM | POA: Diagnosis not present

## 2023-02-19 DIAGNOSIS — Z7902 Long term (current) use of antithrombotics/antiplatelets: Secondary | ICD-10-CM | POA: Diagnosis not present

## 2023-02-19 LAB — ECHOCARDIOGRAM COMPLETE
Area-P 1/2: 2.92 cm2
Calc EF: 36.5 %
Height: 74 in
S' Lateral: 3.8 cm
Single Plane A2C EF: 33 %
Single Plane A4C EF: 47.3 %
Weight: 3121.71 [oz_av]

## 2023-02-19 LAB — LIPID PANEL
Cholesterol: 181 mg/dL (ref 0–200)
HDL: 55 mg/dL (ref >40)
LDL Cholesterol: 118 mg/dL — ABNORMAL HIGH (ref 0–99)
Total CHOL/HDL Ratio: 3.3 {ratio}
Triglycerides: 40 mg/dL (ref <150)
VLDL: 8 mg/dL (ref 0–40)

## 2023-02-19 LAB — CBC
HCT: 45.2 % (ref 39.0–52.0)
Hemoglobin: 14.3 g/dL (ref 13.0–17.0)
MCH: 25.8 pg — ABNORMAL LOW (ref 26.0–34.0)
MCHC: 31.6 g/dL (ref 30.0–36.0)
MCV: 81.6 fL (ref 80.0–100.0)
Platelets: 113 10*3/uL — ABNORMAL LOW (ref 150–400)
RBC: 5.54 MIL/uL (ref 4.22–5.81)
RDW: 16.4 % — ABNORMAL HIGH (ref 11.5–15.5)
WBC: 7 10*3/uL (ref 4.0–10.5)
nRBC: 0 % (ref 0.0–0.2)

## 2023-02-19 LAB — HEMOGLOBIN A1C
Hgb A1c MFr Bld: 5.8 % — ABNORMAL HIGH (ref 4.8–5.6)
Mean Plasma Glucose: 119.76 mg/dL

## 2023-02-19 LAB — RAPID URINE DRUG SCREEN, HOSP PERFORMED
Amphetamines: NOT DETECTED
Barbiturates: NOT DETECTED
Benzodiazepines: NOT DETECTED
Cocaine: POSITIVE — AB
Opiates: NOT DETECTED
Tetrahydrocannabinol: NOT DETECTED

## 2023-02-19 LAB — PROCALCITONIN: Procalcitonin: 0.25 ng/mL

## 2023-02-19 LAB — LACTIC ACID, PLASMA: Lactic Acid, Venous: 1.8 mmol/L (ref 0.5–1.9)

## 2023-02-19 MED ORDER — ACETAMINOPHEN 160 MG/5ML PO SOLN: 650.0000 mg | ORAL | Status: DC | PRN

## 2023-02-19 MED ORDER — CLOPIDOGREL BISULFATE 75 MG PO TABS
75.0000 mg | ORAL_TABLET | Freq: Every day | ORAL | Status: DC
  Administered 2023-02-19 – 2023-02-22 (×4): 75 mg via ORAL
  Filled 2023-02-19 (×4): qty 1

## 2023-02-19 MED ORDER — ACETAMINOPHEN 325 MG PO TABS
650.0000 mg | ORAL_TABLET | ORAL | Status: DC | PRN
  Administered 2023-02-19 – 2023-02-20 (×3): 650 mg via ORAL
  Filled 2023-02-19 (×3): qty 2

## 2023-02-19 MED ORDER — ENOXAPARIN SODIUM 40 MG/0.4ML IJ SOSY
40.0000 mg | PREFILLED_SYRINGE | INTRAMUSCULAR | Status: DC
  Administered 2023-02-19 – 2023-02-22 (×4): 40 mg via SUBCUTANEOUS
  Filled 2023-02-19 (×4): qty 0.4

## 2023-02-19 MED ORDER — POTASSIUM CHLORIDE CRYS ER 20 MEQ PO TBCR
40.0000 meq | EXTENDED_RELEASE_TABLET | Freq: Two times a day (BID) | ORAL | Status: AC
  Administered 2023-02-19 – 2023-02-20 (×2): 40 meq via ORAL
  Filled 2023-02-19 (×2): qty 2

## 2023-02-19 MED ORDER — ATORVASTATIN CALCIUM 80 MG PO TABS
80.0000 mg | ORAL_TABLET | Freq: Every day | ORAL | Status: DC
  Administered 2023-02-19 – 2023-02-22 (×4): 80 mg via ORAL
  Filled 2023-02-19 (×2): qty 1
  Filled 2023-02-19: qty 2
  Filled 2023-02-19: qty 1

## 2023-02-19 MED ORDER — ACETAMINOPHEN 650 MG RE SUPP: 650.0000 mg | RECTAL | Status: DC | PRN

## 2023-02-19 MED ORDER — AMLODIPINE BESYLATE 10 MG PO TABS
10.0000 mg | ORAL_TABLET | Freq: Every morning | ORAL | Status: DC
  Administered 2023-02-19 – 2023-02-22 (×4): 10 mg via ORAL
  Filled 2023-02-19: qty 1
  Filled 2023-02-19: qty 2
  Filled 2023-02-19: qty 1
  Filled 2023-02-19: qty 2

## 2023-02-19 MED ORDER — EZETIMIBE 10 MG PO TABS
10.0000 mg | ORAL_TABLET | Freq: Every day | ORAL | Status: DC
  Administered 2023-02-19 – 2023-02-22 (×4): 10 mg via ORAL
  Filled 2023-02-19 (×4): qty 1

## 2023-02-19 MED ORDER — STROKE: EARLY STAGES OF RECOVERY BOOK
Freq: Once | Status: AC
  Administered 2023-02-20: 1
  Filled 2023-02-19: qty 1

## 2023-02-19 MED ORDER — ASPIRIN 81 MG PO TBEC
81.0000 mg | DELAYED_RELEASE_TABLET | Freq: Every morning | ORAL | Status: DC
  Administered 2023-02-19 – 2023-02-22 (×4): 81 mg via ORAL
  Filled 2023-02-19 (×4): qty 1

## 2023-02-19 NOTE — ED Notes (Signed)
EEG in progress at bedside at this time

## 2023-02-19 NOTE — Progress Notes (Signed)
  INTERVAL PROGRESS NOTE    Isaac Reyes- 72 y.o. male  LOS: 0 __________________________________________________________________  SUBJECTIVE: Admitted 02/18/2023 with cc of  Chief Complaint  Patient presents with   Seizures   Since admission, EEG without epileptic activity.  Cardiology consulted for possible loop placement.  No clear cause of LOC. No repeat since admission.   OBJECTIVE: Blood pressure (!) 152/100, pulse 73, temperature 97.7 F (36.5 C), temperature source Oral, resp. rate 20, height 6\' 2"  (1.88 m), weight 88.5 kg, SpO2 100%.  ASSESSMENT/PLAN:  I have reviewed the full H&P by Dr. Anthoney Harada, and I agree with the assessment and plan as outlined therein. In addition: Stroke- neuro following.  PT evaluated and recommending CIR Pending SLP evaluation and carotid US  Cardiology consult recommends HTN management including adding losartan 50mg  if not better controlled tomorrow   Principal Problem:   Acute ischemic stroke [facility]) Active Problems:   Benign essential HTN   History of cocaine abuse (HCC)   Leeroy Bock, DO [facility] 02/19/2023, 7:11 AM    [email or web address] Available by Epic secure chat 7AM-7PM. If 7PM-7AM, please contact night-coverage   No Charge

## 2023-02-19 NOTE — Assessment & Plan Note (Signed)
Goal per neuro = normotension Unclear if pt taking home BP meds or not. Start / resume norvasc 10 Add additional agents as needed during stay.

## 2023-02-19 NOTE — Procedures (Signed)
Patient Name: Isaac Reyes  MRN: [id number]  Epilepsy Attending: Charlsie Quest  Referring Physician/Provider: Hillary Bow, DO  Date: 02/19/2023 Duration: 22.42 mins  Patient history: 72 year old man with past history of prior strokes with residual speech deficits and walking difficulty, hypercholesterolemia, hypertension, NSVT, prior history of syncope when noncompliant to medications presenting for evaluation of an episode of unresponsiveness with some tremoring. EEG to evaluate for seizure  Level of alertness: Awake,  AEDs during EEG study: None  Technical aspects: This EEG study was done with scalp electrodes positioned according to the 10-20 International system of electrode placement. Electrical activity was reviewed with band pass filter of 1-70Hz , sensitivity of 7 uV/mm, display speed of 67mm/sec with a 60Hz  notched filter applied as appropriate. EEG data were recorded continuously and digitally stored.  Video monitoring was available and reviewed as appropriate.  Description: The posterior dominant rhythm consists of 8 Hz activity of moderate voltage (25-35 uV) seen predominantly in posterior head regions, symmetric and reactive to eye opening and eye closing. Hyperventilation and photic stimulation were not performed.     IMPRESSION: This study is within normal limits. No seizures or epileptiform discharges were seen throughout the recording.  A normal interictal EEG does not exclude the diagnosis of epilepsy.  Voshon Petro Annabelle Harman

## 2023-02-19 NOTE — Assessment & Plan Note (Signed)
+   or - seizure today.  MRI confirms small punctate acute stroke. H/o prior stroke. Stroke pathway See neuro consult note Tele monitor 2d echo EEG ASA 81 Normo tension per neurology given tiny size of infarct PT/OT/SLP FLP Continue / restart statin.

## 2023-02-19 NOTE — Progress Notes (Signed)
OT Cancellation Note  Patient Details Name: Isaac Reyes MRN: [id number] DOB: 1951/01/22   Cancelled Treatment:    Reason Eval/Treat Not Completed: Other (comment).  Patient just returned from MRI, family visiting, requesting OT eval tomorrow.    Naser Schuld D Chase Arnall 02/19/2023, 5:07 PM 02/19/2023  RP, OTR/L  Acute Rehabilitation Services  Office:  [phone]

## 2023-02-19 NOTE — Consult Note (Signed)
PLT 126*   Cardiac EnzymesNo results for input(s): "TROPONINI" in the last 168 hours. No results for input(s): "TROPIPOC" in the last 168 hours.  BNPNo results for input(s): "BNP", "PROBNP" in the last 168 hours.  DDimer No results for input(s): "DDIMER" in the last 168 hours.  Radiology/Studies:  ECHOCARDIOGRAM COMPLETE  Result Date: 02/19/2023    ECHOCARDIOGRAM REPORT   Patient Name:   Isaac Reyes Date of Exam: 02/19/2023 Medical Rec #:  [id number]          Height:       74.0 in Accession #:    [id number]        Weight:       195.1 lb Date of Birth:  Apr 11, 1951         BSA:          2.151 m Patient Age:    72 years          BP:           156/86 mmHg Patient Gender: M                 HR:           83 bpm. Exam Location:  Inpatient Procedure: 2D Echo, Cardiac Doppler and Color Doppler Indications:    stroke  History:        Patient has prior history of Echocardiogram examinations, most                 recent 07/11/2021. Stroke; Risk Factors:Hypertension. Alcohol                 abuse. Cocaine abuse.  Sonographer:    Isaac Reyes RDCS (AE, PE) Referring Phys: [id number] Isaac Reyes IMPRESSIONS  1. Left ventricular ejection fraction, by estimation, is 60 to 65%. The left ventricle has normal function. The left ventricle has no regional wall motion abnormalities. There is mild concentric left ventricular hypertrophy. Left ventricular diastolic parameters are consistent with Grade I diastolic dysfunction (impaired relaxation).  2. Right ventricular systolic function is normal. The right ventricular size is normal.  3. Left atrial size was mild to moderately dilated.  4. The mitral valve is normal in structure. Trivial mitral valve regurgitation. No evidence of mitral stenosis.  5. The aortic valve is normal in structure. Aortic valve regurgitation is trivial. No aortic stenosis is present.  6. The inferior vena cava is dilated in size with <50% respiratory variability, suggesting right atrial pressure of 15 mmHg. FINDINGS  Left Ventricle: Left ventricular ejection fraction, by estimation, is 60 to 65%. The left ventricle has normal function. The left ventricle has no regional wall motion abnormalities. The left ventricular internal cavity size was normal in size. There is  mild concentric left ventricular hypertrophy. Left ventricular diastolic parameters are consistent with Grade I diastolic dysfunction (impaired relaxation). Right Ventricle: The right ventricular size is  normal. No increase in right ventricular wall thickness. Right ventricular systolic function is normal. Left Atrium: Left atrial size was mild to moderately dilated. Right Atrium: Right atrial size was normal in size. Pericardium: There is no evidence of pericardial effusion. Mitral Valve: The mitral valve is normal in structure. There is mild calcification of the mitral valve leaflet(s). Trivial mitral valve regurgitation. No evidence of mitral valve stenosis. Tricuspid Valve: The tricuspid valve is normal in structure. Tricuspid valve regurgitation is not demonstrated. No evidence of tricuspid stenosis. Aortic Valve: The aortic valve is normal in structure. Aortic valve regurgitation is trivial. No  Cardiology Consultation:   Patient ID: Isaac Reyes; [id number]; [birth year]   Admit date: 02/18/2023 Date of Consult: 02/19/2023  Primary Care Provider: Raymon Mutton., FNP Primary Cardiologist: Isaac Rotunda, MD  Primary Electrophysiologist:  None   Patient Profile:   Isaac Reyes is a 72 y.o. male with a hx of syncope who is being seen today for the evaluation of syncope at the request of Dr. Roda Reyes.  History of Present Illness:   [name] presented to the ED after an LOC.   I saw him in the past secondary to syncope.  He was in the hospital in Feb of  2022 for syncope.  He has had this in the past as well.   He had previously been scheduled to get a ILR but he did not follow up for this.   He was to have a stress test for an abnormal EKG and a four week monitor.   He was in the emergency room in June  2023 with some weakness.. He had a negative head CT.  He left hospital AMA.    Of note in June he did have a mildly elevated troponin of 164.  When I saw him last in the office in Sept of last year he was not taking any of his meds.   It was not clear from his friends or notes this admission whether he has been taking his meds more recently.    He presented to the ED with what was described by observers as a 5 to 7-minute period of unresponsiveness with tremor.  There was no trauma reported.  In the emergency room he has slowly improving mental status.  EKG was sinus rhythm and no arrhythmias noted on monitoring.    Work up has included a head CT without acute abnormalities.  MRI demonstrated punctate infarct in the left corona radiata with multiple old infarcts in the deep white matter and deep gray matter and chronic microhemorrhages.  Echocardiogram demonstrates EF of 60 to 65% with mild left atrial enlargement.  Drug screen is positive for cocaine.  EEG was normal.    The patient clearly has some confusion.  He says he did not really pass out.  He said he was sitting on  the stairs waiting for a friend.  He kind of passed out but he said it was controlled and he did not actually fall or hurt himself.  He says the pain and knows when he is going out.  He denies any palpitations.  He says he gets around relatively well.  He is does have a little balance issue per his report and he has to use a cane although it is currently in disrepair.  He can go up to the grocery store.  He does not drive.  He lives alone.  He denies chest pressure, neck or arm discomfort.  He denies any shortness of breath.  He has no palpitations per his report.  He still smokes a few cigarettes.  Says he is not drinking alcohol or using drugs.   Past Medical History:  Diagnosis Date   Acute CVA (cerebrovascular accident) (HCC) 06/2017   "speech issues since" (07/24/2017)   AKI (acute kidney injury) (HCC) 07/24/2017   Anemia    High cholesterol    Lupus    "skin kind"   NSVT (nonsustained ventricular tachycardia) (HCC)    Substance use     Past Surgical History:  Procedure Laterality Date   KNEE ARTHROSCOPY  PLT 126*   Cardiac EnzymesNo results for input(s): "TROPONINI" in the last 168 hours. No results for input(s): "TROPIPOC" in the last 168 hours.  BNPNo results for input(s): "BNP", "PROBNP" in the last 168 hours.  DDimer No results for input(s): "DDIMER" in the last 168 hours.  Radiology/Studies:  ECHOCARDIOGRAM COMPLETE  Result Date: 02/19/2023    ECHOCARDIOGRAM REPORT   Patient Name:   Isaac Reyes Date of Exam: 02/19/2023 Medical Rec #:  [id number]          Height:       74.0 in Accession #:    [id number]        Weight:       195.1 lb Date of Birth:  Apr 11, 1951         BSA:          2.151 m Patient Age:    72 years          BP:           156/86 mmHg Patient Gender: M                 HR:           83 bpm. Exam Location:  Inpatient Procedure: 2D Echo, Cardiac Doppler and Color Doppler Indications:    stroke  History:        Patient has prior history of Echocardiogram examinations, most                 recent 07/11/2021. Stroke; Risk Factors:Hypertension. Alcohol                 abuse. Cocaine abuse.  Sonographer:    Isaac Reyes RDCS (AE, PE) Referring Phys: [id number] Isaac Reyes IMPRESSIONS  1. Left ventricular ejection fraction, by estimation, is 60 to 65%. The left ventricle has normal function. The left ventricle has no regional wall motion abnormalities. There is mild concentric left ventricular hypertrophy. Left ventricular diastolic parameters are consistent with Grade I diastolic dysfunction (impaired relaxation).  2. Right ventricular systolic function is normal. The right ventricular size is normal.  3. Left atrial size was mild to moderately dilated.  4. The mitral valve is normal in structure. Trivial mitral valve regurgitation. No evidence of mitral stenosis.  5. The aortic valve is normal in structure. Aortic valve regurgitation is trivial. No aortic stenosis is present.  6. The inferior vena cava is dilated in size with <50% respiratory variability, suggesting right atrial pressure of 15 mmHg. FINDINGS  Left Ventricle: Left ventricular ejection fraction, by estimation, is 60 to 65%. The left ventricle has normal function. The left ventricle has no regional wall motion abnormalities. The left ventricular internal cavity size was normal in size. There is  mild concentric left ventricular hypertrophy. Left ventricular diastolic parameters are consistent with Grade I diastolic dysfunction (impaired relaxation). Right Ventricle: The right ventricular size is  normal. No increase in right ventricular wall thickness. Right ventricular systolic function is normal. Left Atrium: Left atrial size was mild to moderately dilated. Right Atrium: Right atrial size was normal in size. Pericardium: There is no evidence of pericardial effusion. Mitral Valve: The mitral valve is normal in structure. There is mild calcification of the mitral valve leaflet(s). Trivial mitral valve regurgitation. No evidence of mitral valve stenosis. Tricuspid Valve: The tricuspid valve is normal in structure. Tricuspid valve regurgitation is not demonstrated. No evidence of tricuspid stenosis. Aortic Valve: The aortic valve is normal in structure. Aortic valve regurgitation is trivial. No  Cardiology Consultation:   Patient ID: Isaac Reyes; [id number]; [birth year]   Admit date: 02/18/2023 Date of Consult: 02/19/2023  Primary Care Provider: Raymon Mutton., FNP Primary Cardiologist: Isaac Rotunda, MD  Primary Electrophysiologist:  None   Patient Profile:   Isaac Reyes is a 72 y.o. male with a hx of syncope who is being seen today for the evaluation of syncope at the request of Dr. Roda Reyes.  History of Present Illness:   [name] presented to the ED after an LOC.   I saw him in the past secondary to syncope.  He was in the hospital in Feb of  2022 for syncope.  He has had this in the past as well.   He had previously been scheduled to get a ILR but he did not follow up for this.   He was to have a stress test for an abnormal EKG and a four week monitor.   He was in the emergency room in June  2023 with some weakness.. He had a negative head CT.  He left hospital AMA.    Of note in June he did have a mildly elevated troponin of 164.  When I saw him last in the office in Sept of last year he was not taking any of his meds.   It was not clear from his friends or notes this admission whether he has been taking his meds more recently.    He presented to the ED with what was described by observers as a 5 to 7-minute period of unresponsiveness with tremor.  There was no trauma reported.  In the emergency room he has slowly improving mental status.  EKG was sinus rhythm and no arrhythmias noted on monitoring.    Work up has included a head CT without acute abnormalities.  MRI demonstrated punctate infarct in the left corona radiata with multiple old infarcts in the deep white matter and deep gray matter and chronic microhemorrhages.  Echocardiogram demonstrates EF of 60 to 65% with mild left atrial enlargement.  Drug screen is positive for cocaine.  EEG was normal.    The patient clearly has some confusion.  He says he did not really pass out.  He said he was sitting on  the stairs waiting for a friend.  He kind of passed out but he said it was controlled and he did not actually fall or hurt himself.  He says the pain and knows when he is going out.  He denies any palpitations.  He says he gets around relatively well.  He is does have a little balance issue per his report and he has to use a cane although it is currently in disrepair.  He can go up to the grocery store.  He does not drive.  He lives alone.  He denies chest pressure, neck or arm discomfort.  He denies any shortness of breath.  He has no palpitations per his report.  He still smokes a few cigarettes.  Says he is not drinking alcohol or using drugs.   Past Medical History:  Diagnosis Date   Acute CVA (cerebrovascular accident) (HCC) 06/2017   "speech issues since" (07/24/2017)   AKI (acute kidney injury) (HCC) 07/24/2017   Anemia    High cholesterol    Lupus    "skin kind"   NSVT (nonsustained ventricular tachycardia) (HCC)    Substance use     Past Surgical History:  Procedure Laterality Date   KNEE ARTHROSCOPY  PLT 126*   Cardiac EnzymesNo results for input(s): "TROPONINI" in the last 168 hours. No results for input(s): "TROPIPOC" in the last 168 hours.  BNPNo results for input(s): "BNP", "PROBNP" in the last 168 hours.  DDimer No results for input(s): "DDIMER" in the last 168 hours.  Radiology/Studies:  ECHOCARDIOGRAM COMPLETE  Result Date: 02/19/2023    ECHOCARDIOGRAM REPORT   Patient Name:   Isaac Reyes Date of Exam: 02/19/2023 Medical Rec #:  [id number]          Height:       74.0 in Accession #:    [id number]        Weight:       195.1 lb Date of Birth:  Apr 11, 1951         BSA:          2.151 m Patient Age:    72 years          BP:           156/86 mmHg Patient Gender: M                 HR:           83 bpm. Exam Location:  Inpatient Procedure: 2D Echo, Cardiac Doppler and Color Doppler Indications:    stroke  History:        Patient has prior history of Echocardiogram examinations, most                 recent 07/11/2021. Stroke; Risk Factors:Hypertension. Alcohol                 abuse. Cocaine abuse.  Sonographer:    Isaac Reyes RDCS (AE, PE) Referring Phys: [id number] Isaac Reyes IMPRESSIONS  1. Left ventricular ejection fraction, by estimation, is 60 to 65%. The left ventricle has normal function. The left ventricle has no regional wall motion abnormalities. There is mild concentric left ventricular hypertrophy. Left ventricular diastolic parameters are consistent with Grade I diastolic dysfunction (impaired relaxation).  2. Right ventricular systolic function is normal. The right ventricular size is normal.  3. Left atrial size was mild to moderately dilated.  4. The mitral valve is normal in structure. Trivial mitral valve regurgitation. No evidence of mitral stenosis.  5. The aortic valve is normal in structure. Aortic valve regurgitation is trivial. No aortic stenosis is present.  6. The inferior vena cava is dilated in size with <50% respiratory variability, suggesting right atrial pressure of 15 mmHg. FINDINGS  Left Ventricle: Left ventricular ejection fraction, by estimation, is 60 to 65%. The left ventricle has normal function. The left ventricle has no regional wall motion abnormalities. The left ventricular internal cavity size was normal in size. There is  mild concentric left ventricular hypertrophy. Left ventricular diastolic parameters are consistent with Grade I diastolic dysfunction (impaired relaxation). Right Ventricle: The right ventricular size is  normal. No increase in right ventricular wall thickness. Right ventricular systolic function is normal. Left Atrium: Left atrial size was mild to moderately dilated. Right Atrium: Right atrial size was normal in size. Pericardium: There is no evidence of pericardial effusion. Mitral Valve: The mitral valve is normal in structure. There is mild calcification of the mitral valve leaflet(s). Trivial mitral valve regurgitation. No evidence of mitral valve stenosis. Tricuspid Valve: The tricuspid valve is normal in structure. Tricuspid valve regurgitation is not demonstrated. No evidence of tricuspid stenosis. Aortic Valve: The aortic valve is normal in structure. Aortic valve regurgitation is trivial. No  PLT 126*   Cardiac EnzymesNo results for input(s): "TROPONINI" in the last 168 hours. No results for input(s): "TROPIPOC" in the last 168 hours.  BNPNo results for input(s): "BNP", "PROBNP" in the last 168 hours.  DDimer No results for input(s): "DDIMER" in the last 168 hours.  Radiology/Studies:  ECHOCARDIOGRAM COMPLETE  Result Date: 02/19/2023    ECHOCARDIOGRAM REPORT   Patient Name:   Isaac Reyes Date of Exam: 02/19/2023 Medical Rec #:  [id number]          Height:       74.0 in Accession #:    [id number]        Weight:       195.1 lb Date of Birth:  Apr 11, 1951         BSA:          2.151 m Patient Age:    72 years          BP:           156/86 mmHg Patient Gender: M                 HR:           83 bpm. Exam Location:  Inpatient Procedure: 2D Echo, Cardiac Doppler and Color Doppler Indications:    stroke  History:        Patient has prior history of Echocardiogram examinations, most                 recent 07/11/2021. Stroke; Risk Factors:Hypertension. Alcohol                 abuse. Cocaine abuse.  Sonographer:    Isaac Reyes RDCS (AE, PE) Referring Phys: [id number] Isaac Reyes IMPRESSIONS  1. Left ventricular ejection fraction, by estimation, is 60 to 65%. The left ventricle has normal function. The left ventricle has no regional wall motion abnormalities. There is mild concentric left ventricular hypertrophy. Left ventricular diastolic parameters are consistent with Grade I diastolic dysfunction (impaired relaxation).  2. Right ventricular systolic function is normal. The right ventricular size is normal.  3. Left atrial size was mild to moderately dilated.  4. The mitral valve is normal in structure. Trivial mitral valve regurgitation. No evidence of mitral stenosis.  5. The aortic valve is normal in structure. Aortic valve regurgitation is trivial. No aortic stenosis is present.  6. The inferior vena cava is dilated in size with <50% respiratory variability, suggesting right atrial pressure of 15 mmHg. FINDINGS  Left Ventricle: Left ventricular ejection fraction, by estimation, is 60 to 65%. The left ventricle has normal function. The left ventricle has no regional wall motion abnormalities. The left ventricular internal cavity size was normal in size. There is  mild concentric left ventricular hypertrophy. Left ventricular diastolic parameters are consistent with Grade I diastolic dysfunction (impaired relaxation). Right Ventricle: The right ventricular size is  normal. No increase in right ventricular wall thickness. Right ventricular systolic function is normal. Left Atrium: Left atrial size was mild to moderately dilated. Right Atrium: Right atrial size was normal in size. Pericardium: There is no evidence of pericardial effusion. Mitral Valve: The mitral valve is normal in structure. There is mild calcification of the mitral valve leaflet(s). Trivial mitral valve regurgitation. No evidence of mitral valve stenosis. Tricuspid Valve: The tricuspid valve is normal in structure. Tricuspid valve regurgitation is not demonstrated. No evidence of tricuspid stenosis. Aortic Valve: The aortic valve is normal in structure. Aortic valve regurgitation is trivial. No  PLT 126*   Cardiac EnzymesNo results for input(s): "TROPONINI" in the last 168 hours. No results for input(s): "TROPIPOC" in the last 168 hours.  BNPNo results for input(s): "BNP", "PROBNP" in the last 168 hours.  DDimer No results for input(s): "DDIMER" in the last 168 hours.  Radiology/Studies:  ECHOCARDIOGRAM COMPLETE  Result Date: 02/19/2023    ECHOCARDIOGRAM REPORT   Patient Name:   Isaac Reyes Date of Exam: 02/19/2023 Medical Rec #:  [id number]          Height:       74.0 in Accession #:    [id number]        Weight:       195.1 lb Date of Birth:  Apr 11, 1951         BSA:          2.151 m Patient Age:    72 years          BP:           156/86 mmHg Patient Gender: M                 HR:           83 bpm. Exam Location:  Inpatient Procedure: 2D Echo, Cardiac Doppler and Color Doppler Indications:    stroke  History:        Patient has prior history of Echocardiogram examinations, most                 recent 07/11/2021. Stroke; Risk Factors:Hypertension. Alcohol                 abuse. Cocaine abuse.  Sonographer:    Isaac Reyes RDCS (AE, PE) Referring Phys: [id number] Isaac Reyes IMPRESSIONS  1. Left ventricular ejection fraction, by estimation, is 60 to 65%. The left ventricle has normal function. The left ventricle has no regional wall motion abnormalities. There is mild concentric left ventricular hypertrophy. Left ventricular diastolic parameters are consistent with Grade I diastolic dysfunction (impaired relaxation).  2. Right ventricular systolic function is normal. The right ventricular size is normal.  3. Left atrial size was mild to moderately dilated.  4. The mitral valve is normal in structure. Trivial mitral valve regurgitation. No evidence of mitral stenosis.  5. The aortic valve is normal in structure. Aortic valve regurgitation is trivial. No aortic stenosis is present.  6. The inferior vena cava is dilated in size with <50% respiratory variability, suggesting right atrial pressure of 15 mmHg. FINDINGS  Left Ventricle: Left ventricular ejection fraction, by estimation, is 60 to 65%. The left ventricle has normal function. The left ventricle has no regional wall motion abnormalities. The left ventricular internal cavity size was normal in size. There is  mild concentric left ventricular hypertrophy. Left ventricular diastolic parameters are consistent with Grade I diastolic dysfunction (impaired relaxation). Right Ventricle: The right ventricular size is  normal. No increase in right ventricular wall thickness. Right ventricular systolic function is normal. Left Atrium: Left atrial size was mild to moderately dilated. Right Atrium: Right atrial size was normal in size. Pericardium: There is no evidence of pericardial effusion. Mitral Valve: The mitral valve is normal in structure. There is mild calcification of the mitral valve leaflet(s). Trivial mitral valve regurgitation. No evidence of mitral valve stenosis. Tricuspid Valve: The tricuspid valve is normal in structure. Tricuspid valve regurgitation is not demonstrated. No evidence of tricuspid stenosis. Aortic Valve: The aortic valve is normal in structure. Aortic valve regurgitation is trivial. No

## 2023-02-19 NOTE — Progress Notes (Addendum)
STROKE TEAM PROGRESS NOTE   BRIEF HPI Mr. TAKI CURRIER is a 72 y.o. male with history of prior strokes, hyperlipidemia, hypertension, and SVT, syncope and cocaine use presenting with an episode of unresponsiveness with some tremoring.  EEG was negative for seizure activity, but brain MRI revealed small acute infarct in the left corona radiata.  This is likely an incidental stroke, but given patient's uncontrolled risk factors, stroke workup would be worthwhile.  Of note, patient denies cocaine use but admission urine toxicology was positive for cocaine.   SIGNIFICANT HOSPITAL EVENTS 9/30 patient admitted and found to have left corona radiata stroke  INTERIM HISTORY/SUBJECTIVE Patient remains afebrile with stable vital signs.  His neurological exam is at baseline.  His LDL is noted to be high at 118, but it is uncertain if he is taking Lipitor at home as prescribed.   OBJECTIVE  CBC    Component Value Date/Time   WBC 5.7 02/18/2023 1750   RBC 5.10 02/18/2023 1750   HGB 13.2 02/18/2023 1750   HCT 43.4 02/18/2023 1750   PLT 126 (L) 02/18/2023 1750   MCV 85.1 02/18/2023 1750   MCH 25.9 (L) 02/18/2023 1750   MCHC 30.4 02/18/2023 1750   RDW 16.4 (H) 02/18/2023 1750   LYMPHSABS 0.6 (L) 02/18/2023 1750   MONOABS 0.4 02/18/2023 1750   EOSABS 0.0 02/18/2023 1750   BASOSABS 0.0 02/18/2023 1750    BMET    Component Value Date/Time   NA 137 02/18/2023 1750   K 3.2 (L) 02/18/2023 1750   CL 104 02/18/2023 1750   CO2 22 02/18/2023 1750   GLUCOSE 160 (H) 02/18/2023 1750   BUN 17 02/18/2023 1750   CREATININE 1.43 (H) 02/18/2023 1750   CALCIUM 8.1 (L) 02/18/2023 1750   GFRNONAA 52 (L) 02/18/2023 1750    IMAGING past 24 hours ECHOCARDIOGRAM COMPLETE  Result Date: 02/19/2023    ECHOCARDIOGRAM REPORT   Patient Name:   COSTANTINO ROSSBERG Sergent Date of Exam: 02/19/2023 Medical Rec #:  [id number]         Height:       74.0 in Accession #:    [id number]        Weight:       195.1 lb Date of  Birth:  [birth year]         BSA:          2.151 m Patient Age:    72 years          BP:           156/86 mmHg Patient Gender: M                 HR:           83 bpm. Exam Location:  Inpatient Procedure: 2D Echo, Cardiac Doppler and Color Doppler Indications:    stroke  History:        Patient has prior history of Echocardiogram examinations, most                 recent 07/11/2021. Stroke; Risk Factors:Hypertension. Alcohol                 abuse. Cocaine abuse.  Sonographer:    Melissa Morford RDCS (AE, PE) Referring Phys: [id number] JARED M GARDNER IMPRESSIONS  1. Left ventricular ejection fraction, by estimation, is 60 to 65%. The left ventricle has normal function. The left ventricle has no regional wall motion abnormalities. There is mild concentric left ventricular hypertrophy. Left ventricular diastolic  parameters are consistent with Grade I diastolic dysfunction (impaired relaxation).  2. Right ventricular systolic function is normal. The right ventricular size is normal.  3. Left atrial size was mild to moderately dilated.  4. The mitral valve is normal in structure. Trivial mitral valve regurgitation. No evidence of mitral stenosis.  5. The aortic valve is normal in structure. Aortic valve regurgitation is trivial. No aortic stenosis is present.  6. The inferior vena cava is dilated in size with <50% respiratory variability, suggesting right atrial pressure of 15 mmHg. FINDINGS  Left Ventricle: Left ventricular ejection fraction, by estimation, is 60 to 65%. The left ventricle has normal function. The left ventricle has no regional wall motion abnormalities. The left ventricular internal cavity size was normal in size. There is  mild concentric left ventricular hypertrophy. Left ventricular diastolic parameters are consistent with Grade I diastolic dysfunction (impaired relaxation). Right Ventricle: The right ventricular size is normal. No increase in right ventricular wall thickness. Right ventricular systolic  function is normal. Left Atrium: Left atrial size was mild to moderately dilated. Right Atrium: Right atrial size was normal in size. Pericardium: There is no evidence of pericardial effusion. Mitral Valve: The mitral valve is normal in structure. There is mild calcification of the mitral valve leaflet(s). Trivial mitral valve regurgitation. No evidence of mitral valve stenosis. Tricuspid Valve: The tricuspid valve is normal in structure. Tricuspid valve regurgitation is not demonstrated. No evidence of tricuspid stenosis. Aortic Valve: The aortic valve is normal in structure. Aortic valve regurgitation is trivial. No aortic stenosis is present. Pulmonic Valve: The pulmonic valve was normal in structure. Pulmonic valve regurgitation is not visualized. No evidence of pulmonic stenosis. Aorta: The aortic root is normal in size and structure. Venous: The inferior vena cava is dilated in size with less than 50% respiratory variability, suggesting right atrial pressure of 15 mmHg. IAS/Shunts: No atrial level shunt detected by color flow Doppler.  LEFT VENTRICLE PLAX 2D LVIDd:         4.20 cm      Diastology LVIDs:         3.80 cm      LV e' medial:    7.40 cm/s LV PW:         1.00 cm      LV E/e' medial:  7.7 LV IVS:        1.10 cm      LV e' lateral:   8.59 cm/s LVOT diam:     1.70 cm      LV E/e' lateral: 6.6 LV SV:         40 LV SV Index:   19 LVOT Area:     2.27 cm  LV Volumes (MOD) LV vol d, MOD A2C: 135.0 ml LV vol d, MOD A4C: 114.0 ml LV vol s, MOD A2C: 90.4 ml LV vol s, MOD A4C: 60.1 ml LV SV MOD A2C:     44.6 ml LV SV MOD A4C:     114.0 ml LV SV MOD BP:      46.9 ml RIGHT VENTRICLE RV S prime:     10.90 cm/s TAPSE (M-mode): 1.5 cm LEFT ATRIUM           Index        RIGHT ATRIUM           Index LA diam:      4.30 cm 2.00 cm/m   RA Area:     16.10 cm LA Vol (A2C): 84.2 ml  39.15 ml/m  RA Volume:   37.60 ml  17.48 ml/m LA Vol (A4C): 65.8 ml 30.59 ml/m  AORTIC VALVE LVOT Vmax:   87.20 cm/s LVOT Vmean:  67.200  cm/s LVOT VTI:    0.177 m  AORTA Ao Root diam: 3.60 cm MITRAL VALVE MV Area (PHT): 2.92 cm     SHUNTS MV Decel Time: 260 msec     Systemic VTI:  0.18 m MV E velocity: 56.70 cm/s   Systemic Diam: 1.70 cm MV A velocity: 126.00 cm/s MV E/A ratio:  0.45 Arvilla Meres MD Electronically signed by Arvilla Meres MD Signature Date/Time: 02/19/2023/8:53:48 AM    Final    EEG adult  Result Date: 02/19/2023 Charlsie Quest, MD     02/19/2023  8:28 AM Patient Name: Isaac Reyes MRN: [id number] Epilepsy Attending: Charlsie Quest Referring Physician/Provider: Hillary Bow, DO Date: 02/19/2023 Duration: 22.42 mins Patient history: 72 year old man with past history of prior strokes with residual speech deficits and walking difficulty, hypercholesterolemia, hypertension, NSVT, prior history of syncope when noncompliant to medications presenting for evaluation of an episode of unresponsiveness with some tremoring. EEG to evaluate for seizure Level of alertness: Awake, AEDs during EEG study: None Technical aspects: This EEG study was done with scalp electrodes positioned according to the 10-20 International system of electrode placement. Electrical activity was reviewed with band pass filter of 1-70Hz , sensitivity of 7 uV/mm, display speed of 82mm/sec with a 60Hz  notched filter applied as appropriate. EEG data were recorded continuously and digitally stored.  Video monitoring was available and reviewed as appropriate. Description: The posterior dominant rhythm consists of 8 Hz activity of moderate voltage (25-35 uV) seen predominantly in posterior head regions, symmetric and reactive to eye opening and eye closing. Hyperventilation and photic stimulation were not performed.   IMPRESSION: This study is within normal limits. No seizures or epileptiform discharges were seen throughout the recording. A normal interictal EEG does not exclude the diagnosis of epilepsy. Charlsie Quest   MR BRAIN WO CONTRAST  Result  Date: 02/18/2023 CLINICAL DATA:  Dysarthria EXAM: MRI HEAD WITHOUT CONTRAST TECHNIQUE: Multiplanar, multiecho pulse sequences of the brain and surrounding structures were obtained without intravenous contrast. COMPARISON:  07/04/2020 FINDINGS: Brain: There is a punctate focus of abnormal diffusion restriction within the left corona radiata. There are numerous chronic microhemorrhages in a predominantly central distribution. There is confluent hyperintense T2-weighted signal within the periventricular and deep white matter. Mild volume loss. Multiple old infarcts of the deep white matter and deep gray nuclei. No hydrocephalus. The major midline structures are normal. Vascular: Normal flow voids. Skull and upper cervical spine: Normal bone marrow signal. Sinuses/Orbits: Paranasal sinuses are clear. No mastoid effusion. Normal orbits. Other: None IMPRESSION: 1. Punctate focus of acute ischemia within the left corona radiata. No hemorrhage or mass effect. 2. Multiple old infarcts of the deep white matter and deep gray nuclei. 3. Numerous chronic microhemorrhages in a predominantly central distribution, consistent with chronic hypertensive angiopathy. Electronically Signed   By: Deatra Robinson M.D.   On: 02/18/2023 23:18   CT HEAD WO CONTRAST  Result Date: 02/18/2023 CLINICAL DATA:  Altered mental status EXAM: CT HEAD WITHOUT CONTRAST TECHNIQUE: Contiguous axial images were obtained from the base of the skull through the vertex without intravenous contrast. RADIATION DOSE REDUCTION: This exam was performed according to the departmental dose-optimization program which includes automated exposure control, adjustment of the mA and/or kV according to patient size and/or use of iterative reconstruction technique. COMPARISON:  11/15/2021 FINDINGS: Brain: No evidence of acute infarction, hemorrhage, hydrocephalus, extra-axial collection or mass lesion/mass effect. Chronic atrophic and ischemic changes are identified.  Scattered lacunar infarcts are noted within the thalami and basal ganglia bilaterally. Vascular: No hyperdense vessel or unexpected calcification. Skull: Normal. Negative for fracture or focal lesion. Sinuses/Orbits: No acute finding. Other: None. IMPRESSION: Chronic atrophic and ischemic changes without acute abnormality. Electronically Signed   By: Alcide Clever M.D.   On: 02/18/2023 20:58   DG Chest Port 1 View  Result Date: 02/18/2023 CLINICAL DATA:  Altered level of consciousness EXAM: PORTABLE CHEST 1 VIEW COMPARISON:  11/01/2021 FINDINGS: Cardiac shadow is stable. Elevation of left hemidiaphragm is noted. No free air is seen. No focal infiltrate or effusion is noted. No bony abnormality noted. IMPRESSION: No acute abnormality noted. Electronically Signed   By: Alcide Clever M.D.   On: 02/18/2023 20:51    Vitals:   02/19/23 0820 02/19/23 1000 02/19/23 1205 02/19/23 1210  BP: (!) 166/110 (!) 151/94  (!) 148/84  Pulse: 78 74  73  Resp: 18 (!) 21  19  Temp: 98.4 F (36.9 C)  97.6 F (36.4 C)   TempSrc:   Oral   SpO2: 100% 100%  100%  Weight:      Height:         PHYSICAL EXAM General:  Alert, well-nourished, well-developed patient in no acute distress Psych:  Mood and affect appropriate for situation CV: Regular rate and rhythm on monitor Respiratory:  Regular, unlabored respirations on room air   NEURO:  Mental Status: AA&Ox2, disoriented to year, patient is able to give some history Speech/Language: speech is without dysarthria or aphasia.    Cranial Nerves:  II: PERRL.  III, IV, VI: EOMI. Eyelids elevate symmetrically.  V: Sensation is intact to light touch and symmetrical to face.  VII: Subtle right facial droop VIII: hearing intact to voice. IX, X: Phonation is normal.  PI:RJJOACZY shrug 5/5. XII: tongue is midline without fasciculations. Motor: 5/5 strength to all muscle groups tested.  Tone: is normal and bulk is normal Sensation- Intact to light touch bilaterally.   Coordination: FTN intact bilaterally.No drift.  Gait- deferred  ASSESSMENT/PLAN  Acute Ischemic Infarct:  left corona radiata punctate infarct, etiology: Likely small vessel disease in the setting of cocaine use and uncontrolled risk factors CT head No acute abnormality. Small vessel disease. Atrophy.  MRI punctate infarct in the left corona radiata, multiple old infarcts of deep white matter and deep gray nuclei, numerous chronic microhemorrhages in a predominantly central distribution consistent with chronic hypertensive angiopathy MRA pending Carotid Doppler pending 2D Echo EF 60 to 65%  LDL 118 HgbA1c 5.8 UDS positive for cocaine VTE prophylaxis - Lovenox aspirin 81 mg daily prior to admission, now on aspirin 81 mg daily and clopidogrel 75 mg daily for 3 weeks and then Plavix alone. Therapy recommendations:  CIR Disposition: Pending  Hx of Stroke/TIA Patient has a history of right basal ganglia infarct in March 2019 due to small vessel disease.  EF 55 to 60%.  LDL 124, A1c 5.7, discharged on aspirin and Lipitor left basal ganglia infarct in February 2020.  MRI showed left small caudate tail infarct, and subacute cerebellum small infarct.  CTA head and neck unremarkable LDL 82, A1c 5.6.  Discharged on DAPT and Lipitor 20 Follow-up with Ihor Austin, NP at [facility], residual slurred speech and difficulty walking  Syncope Follow-up with Dr. Antoine Poche cardiology Dr. Antoine Poche consulted 2D echo unremarkable Dr. Dione Housekeeper think screening  for a cardiac arrhythmia with an implanted monitor would be high yield and would likely be difficult to follow post hospitalization.   Hypertension Home meds: Amlodipine 10 mg daily Stable Long-term BP goal normotensive Avoid low BP  Hyperlipidemia Home meds: Atorvastatin 80 mg daily (unsure of compliance), resumed in hospital LDL 118, goal < 70 Continue home Lipitor 80 Continue statin at discharge  Tobacco abuse Current smoker, patient  stated that he smokes 1 cigarette/day Smoking cessation counseling provided Pt is willing to quit  Cocaine abuse UDS positive for Cocaine Patient states he does not use cocaine, but he has had multiple positive urine drug screens TOC consult for cessation placed  Other Stroke Risk Factors SVT  Other Active Problems Discoid lupus  Hospital day # 0  Patient seen by NP and then by MD, MD to edit note as needed. Cortney E Ernestina Columbia , MSN, AGACNP-BC [facility] See Amion for schedule and pager information 02/19/2023 12:46 PM    ATTENDING NOTE: I reviewed above note and agree with the assessment and plan. Pt was seen and examined.   Daughter at bedside.  Patient lying in bed, awake alert, orientated to people, however not orientated to time or place or age, stated September 2023, not able to tell me hospital but able to choose hospital from multiple choice questions.  Paucity of speech, however no aphasia, mild dysarthria.  Cranial nerves intact, moving all extremities, sensation symmetrical, no ataxia.  MRI showed left CR/SO punctate infarct, not able to explain his episode of LOC.  He did have a history of syncope in the past and follows with Dr. Antoine Poche in cardiology who was consulted this time, recommend conservative treatment, do not feel cardiac monitoring necessary.  Pending MRA and carotid Doppler.  On DAPT for 3 weeks and then Plavix alone.  Continue statin Lipitor 80.  PT and OT recommend CIR.  Will follow.  For detailed assessment and plan, please refer to above/below as I have made changes wherever appropriate.   Marvel Plan, MD PhD Stroke Neurology 02/19/2023 7:08 PM    To contact Stroke Continuity provider, please refer to [email or web address]. After hours, contact General Neurology

## 2023-02-19 NOTE — Consult Note (Signed)
Neurology Consultation  Reason for Consult: Stroke Referring Physician: Dr. Jeraldine Loots  CC: Unresponsiveness, abnormal imaging  History is obtained from: Chart, patient, patient's friend at bedside  HPI: Isaac Reyes is a 72 y.o. male past medical history of prior strokes with residual speech deficits and walking difficulty, hypercholesterolemia, hypertension, NSVT, prior history of syncope for which she follows with cardiology where he reported he is not taking any medications, he does use a cane off-and-on and has been having falls as well-presented after an episode of unresponsiveness with concern for some tremoring at that time with gradual return to being normally interactive.  No history of seizures.  No clear description of these tremulousness episodes with depressed consciousness Evaluated in the emergency department.  MRI obtained which shows a small acute infarct-see below. Admitted for further workup. Of note, his prior UDS's have been positive for cocaine but he denies using cocaine when I asked him. Friend says that he is taking his medications although outpatient notes from a day or so ago report noncompliance.  He supposed to be on an aspirin which she says he takes every day.    LKW: Unclear IV thrombolysis given?: no, unclear last known well, no focal deficits that are new on exam Premorbid modified Rankin scale (mRS): 2-3   ROS: Full ROS was performed and is negative except as noted in the HPI.   Past Medical History:  Diagnosis Date   Acute CVA (cerebrovascular accident) (HCC) 06/2017   "speech issues since" (07/24/2017)   AKI (acute kidney injury) (HCC) 07/24/2017   Anemia    High cholesterol    Lupus (HCC)    "skin kind"   NSVT (nonsustained ventricular tachycardia) (HCC)     Family History  Problem Relation Age of Onset   Heart attack Father     Social History:   reports that he quit smoking about 6 years ago. His smoking use included cigars and  cigarettes. He started smoking about 51 years ago. He has a 5.4 pack-year smoking history. He has never used smokeless tobacco. He reports current alcohol use of about 3.0 standard drinks of alcohol per week. He reports that he does not use drugs.  Medications  Current Facility-Administered Medications:    [COMPLETED] sodium chloride 0.9 % bolus 1,000 mL, 1,000 mL, Intravenous, Once, Stopped at 02/18/23 1918 **AND** 0.9 %  sodium chloride infusion, , Intravenous, Continuous, Gerhard Munch, MD, Last Rate: 125 mL/hr at 02/18/23 1800, New Bag at 02/18/23 1800  Current Outpatient Medications:    acetaminophen (TYLENOL) 500 MG tablet, Take 2 tablets (1,000 mg total) by mouth every 6 (six) hours as needed., Disp: 60 tablet, Rfl: 0   amLODipine (NORVASC) 10 MG tablet, Take 1 tablet (10 mg total) by mouth daily. (Patient taking differently: Take 10 mg by mouth every morning.), Disp: 30 tablet, Rfl: 0   aspirin EC 81 MG tablet, Take 81 mg by mouth every morning. Swallow whole., Disp: , Rfl:    atorvastatin (LIPITOR) 80 MG tablet, Take 80 mg by mouth daily., Disp: , Rfl:    losartan (COZAAR) 100 MG tablet, Take 100 mg by mouth daily. (Patient not taking: Reported on 01/23/2022), Disp: , Rfl:    Multiple Vitamin (MULTIVITAMIN WITH MINERALS) TABS tablet, Take 1 tablet by mouth every morning., Disp: , Rfl:    Exam: Current vital signs: BP (!) 172/104   Pulse 89   Temp (!) 97.4 F (36.3 C) (Oral)   Resp (!) 22   SpO2 100%  Vital  signs in last 24 hours: Temp:  [97.4 F (36.3 C)] 97.4 F (36.3 C) (09/30 1723) Pulse Rate:  [82-94] 89 (09/30 1900) Resp:  [12-27] 22 (09/30 1900) BP: (130-175)/(79-104) 172/104 (09/30 1900) SpO2:  [98 %-100 %] 100 % (09/30 1900)  GENERAL: Awake, alert in NAD HEENT: - Normocephalic and atraumatic, dry mm, no LN++, no Thyromegally LUNGS - Clear to auscultation bilaterally with no wheezes CV - S1S2 RRR, no m/r/g, equal pulses bilaterally. ABDOMEN - Soft, nontender,  nondistended with normoactive BS Ext: warm, well perfused, intact peripheral pulses, no edema  NEURO:  He is awake alert oriented x 3.  It took him a while to tell me the correct month but he was ultimately able to say the correct month. Speech is mildly dysarthric He has mild loss in fluency as well. Cranial nerves II to XII intact Motor examination with no drift in any of the 4 extremities Sensation intact to light touch without extinction Coordination examination with no dysmetria NIHSS 1a Level of Conscious.: 0 1b LOC Questions: 0 1c LOC Commands: 0 2 Best Gaze: 0 3 Visual: 0 4 Facial Palsy: 0 5a Motor Arm - left: 0 5b Motor Arm - Right: 0 6a Motor Leg - Left: 0 6b Motor Leg - Right: 0 7 Limb Ataxia: 0 8 Sensory: 0 9 Best Language: 1 10 Dysarthria: 1 11 Extinct. and Inatten.: 0 TOTAL: 2   Labs I have reviewed labs in epic and the results pertinent to this consultation are:  CBC    Component Value Date/Time   WBC 5.7 02/18/2023 1750   RBC 5.10 02/18/2023 1750   HGB 13.2 02/18/2023 1750   HCT 43.4 02/18/2023 1750   PLT 126 (L) 02/18/2023 1750   MCV 85.1 02/18/2023 1750   MCH 25.9 (L) 02/18/2023 1750   MCHC 30.4 02/18/2023 1750   RDW 16.4 (H) 02/18/2023 1750   LYMPHSABS 0.6 (L) 02/18/2023 1750   MONOABS 0.4 02/18/2023 1750   EOSABS 0.0 02/18/2023 1750   BASOSABS 0.0 02/18/2023 1750    CMP     Component Value Date/Time   NA 137 02/18/2023 1750   K 3.2 (L) 02/18/2023 1750   CL 104 02/18/2023 1750   CO2 22 02/18/2023 1750   GLUCOSE 160 (H) 02/18/2023 1750   BUN 17 02/18/2023 1750   CREATININE 1.43 (H) 02/18/2023 1750   CALCIUM 8.1 (L) 02/18/2023 1750   PROT 7.0 02/18/2023 1750   ALBUMIN 3.2 (L) 02/18/2023 1750   AST 24 02/18/2023 1750   ALT 26 02/18/2023 1750   ALKPHOS 100 02/18/2023 1750   BILITOT 0.7 02/18/2023 1750   GFRNONAA 52 (L) 02/18/2023 1750   GFRAA 45 (L) 12/22/2018 1226    Imaging I have reviewed the images obtained:  CT head:  Chronic white matter disease MRI brain without contrast: Punctate focus of acute ischemia in the left corona radiata.  Multiple old infarcts in the white matter and deep gray matter nuclei.  Numerous chronic microhemorrhages consistent with hypertensive angiopathy.  Assessment: 72 year old man with past history of prior strokes with residual speech deficits and walking difficulty, hypercholesterolemia, hypertension, NSVT, prior history of syncope when noncompliant to medications presenting for evaluation of an episode of unresponsiveness with some tremoring.  Unclear if there was any frank seizure-like activity noted.  MRI brain reveals a punctate left corona radiata infarct along with numerous chronic microhemorrhages and extensive chronic white matter disease. Small vessel etiology versus vasospasm due to cocaine is a likely etiology of his strokes but  given history of CHF, a cardioembolic etiology should also be looked at. Unclear if the presentation was clearly related to the abnormal imaging findings but needs stroke risk factor workup nonetheless   Impression: Episode of unresponsiveness-evaluate for seizures Acute ischemic stroke  Recommendations: Admit to hospitalist Frequent neurochecks Telemetry Aspirin 81 High intensity statin CTA head and neck 2D echo Lipid panel A1c PT OT Speech therapy EEG Unclear last known well-no need for permissive hypertension.  Goal blood pressure is normotension. Stroke neurology will follow with you Plan was discussed with Dr. Jeraldine Loots   -- Milon Dikes, MD Neurologist [facility] Pager: [phone]

## 2023-02-19 NOTE — ED Notes (Signed)
ED TO INPATIENT HANDOFF REPORT  ED Nurse Name and Phone #: Iline Oven, rn [phone]  S Name/Age/Gender Isaac Reyes 72 y.o. male Room/Bed: 007C/007C  Code Status   Code Status: Do not attempt resuscitation (DNR) PRE-ARREST INTERVENTIONS DESIRED  Home/SNF/Other came from home, possibly dc to rehab Patient oriented to: self, place, time, and situation Is this baseline? Yes   Triage Complete: Triage complete  Chief Complaint Acute ischemic stroke [facility]) [I63.9]  Triage Note Patient arrived from home after friends called ems stating they witnessed patient having a seizure. When ems arrived patient alert and oriented, denies pain. Patient states I feel fine and I passed out. No incontinence, no tongue/mouth trauma   Allergies No Known Allergies  Level of Care/Admitting Diagnosis ED Disposition     ED Disposition  Admit   Condition  --   Comment  Hospital Area: [facility] [[id number]]  Level of Care: Telemetry Medical [[id number]]  May place patient in observation at [facility] or [facility] if equivalent level of care is available:: No  Covid Evaluation: Asymptomatic - no recent exposure (last 10 days) testing not required  Diagnosis: Acute ischemic stroke [facility]) [[id number]]  Admitting Physician: Hillary Bow [[id number]]  Attending Physician: Hillary Bow [[id number]]          B Medical/Surgery History Past Medical History:  Diagnosis Date   Acute CVA (cerebrovascular accident) (HCC) 06/2017   "speech issues since" (07/24/2017)   AKI (acute kidney injury) (HCC) 07/24/2017   Anemia    High cholesterol    Lupus (HCC)    "skin kind"   NSVT (nonsustained ventricular tachycardia) (HCC)    Past Surgical History:  Procedure Laterality Date   KNEE ARTHROSCOPY     TONSILLECTOMY       A IV Location/Drains/Wounds Patient Lines/Drains/Airways Status     Active Line/Drains/Airways     None            Intake/Output Last 24 hours  Intake/Output Summary  (Last 24 hours) at 02/19/2023 1204 Last data filed at 02/18/2023 1918 Gross per 24 hour  Intake 1000 ml  Output --  Net 1000 ml    Labs/Imaging Results for orders placed or performed during the hospital encounter of 02/18/23 (from the past 48 hour(s))  CBG monitoring, ED     Status: Abnormal   Collection Time: 02/18/23  5:50 PM  Result Value Ref Range   Glucose-Capillary 147 (H) 70 - 99 mg/dL    Comment: Glucose reference range applies only to samples taken after fasting for at least 8 hours.  Comprehensive metabolic panel     Status: Abnormal   Collection Time: 02/18/23  5:50 PM  Result Value Ref Range   Sodium 137 135 - 145 mmol/L   Potassium 3.2 (L) 3.5 - 5.1 mmol/L   Chloride 104 98 - 111 mmol/L   CO2 22 22 - 32 mmol/L   Glucose, Bld 160 (H) 70 - 99 mg/dL    Comment: Glucose reference range applies only to samples taken after fasting for at least 8 hours.   BUN 17 8 - 23 mg/dL   Creatinine, Ser 6.60 (H) 0.61 - 1.24 mg/dL   Calcium 8.1 (L) 8.9 - 10.3 mg/dL   Total Protein 7.0 6.5 - 8.1 g/dL   Albumin 3.2 (L) 3.5 - 5.0 g/dL   AST 24 15 - 41 U/L   ALT 26 0 - 44 U/L   Alkaline Phosphatase 100 38 - 126 U/L   Total  Bilirubin 0.7 0.3 - 1.2 mg/dL   GFR, Estimated 52 (L) >60 mL/min    Comment: (NOTE) Calculated using the CKD-EPI Creatinine Equation (2021)    Anion gap 11 5 - 15    Comment: Performed at [facility], [address], [place], [place] [address]  CBC with Differential/Platelet     Status: Abnormal   Collection Time: 02/18/23  5:50 PM  Result Value Ref Range   WBC 5.7 4.0 - 10.5 K/uL   RBC 5.10 4.22 - 5.81 MIL/uL   Hemoglobin 13.2 13.0 - 17.0 g/dL   HCT 81.1 91.4 - 78.2 %   MCV 85.1 80.0 - 100.0 fL   MCH 25.9 (L) 26.0 - 34.0 pg   MCHC 30.4 30.0 - 36.0 g/dL   RDW 95.6 (H) 21.3 - 08.6 %   Platelets 126 (L) 150 - 400 K/uL    Comment: REPEATED TO VERIFY   nRBC 0.0 0.0 - 0.2 %   Neutrophils Relative % 81 %   Neutro Abs 4.6 1.7 - 7.7 K/uL   Lymphocytes  Relative 11 %   Lymphs Abs 0.6 (L) 0.7 - 4.0 K/uL   Monocytes Relative 7 %   Monocytes Absolute 0.4 0.1 - 1.0 K/uL   Eosinophils Relative 0 %   Eosinophils Absolute 0.0 0.0 - 0.5 K/uL   Basophils Relative 1 %   Basophils Absolute 0.0 0.0 - 0.1 K/uL   Immature Granulocytes 0 %   Abs Immature Granulocytes 0.02 0.00 - 0.07 K/uL    Comment: Performed at [facility], [address] [place], [place] [address]  Ethanol     Status: None   Collection Time: 02/18/23  5:50 PM  Result Value Ref Range   Alcohol, Ethyl (B) <10 <10 mg/dL    Comment: (NOTE) Lowest detectable limit for serum alcohol is 10 mg/dL.  For medical purposes only. Performed at [facility], [address] [place], [place] [address]   I-Stat Lactic Acid, ED     Status: Abnormal   Collection Time: 02/18/23  5:59 PM  Result Value Ref Range   Lactic Acid, Venous 2.1 (HH) 0.5 - 1.9 mmol/L   Comment NOTIFIED PHYSICIAN   Urinalysis, Routine w reflex microscopic -Urine, Clean Catch     Status: None   Collection Time: 02/18/23  8:49 PM  Result Value Ref Range   Color, Urine YELLOW YELLOW   APPearance CLEAR CLEAR   Specific Gravity, Urine 1.009 1.005 - 1.030   pH 7.0 5.0 - 8.0   Glucose, UA NEGATIVE NEGATIVE mg/dL   Hgb urine dipstick NEGATIVE NEGATIVE   Bilirubin Urine NEGATIVE NEGATIVE   Ketones, ur NEGATIVE NEGATIVE mg/dL   Protein, ur NEGATIVE NEGATIVE mg/dL   Nitrite NEGATIVE NEGATIVE   Leukocytes,Ua NEGATIVE NEGATIVE    Comment: Performed at [facility], [address] [place], [place] [address]  Lipid panel     Status: Abnormal   Collection Time: 02/19/23  2:58 AM  Result Value Ref Range   Cholesterol 181 0 - 200 mg/dL   Triglycerides 40 <244 mg/dL   HDL 55 >01 mg/dL   Total CHOL/HDL Ratio 3.3 RATIO   VLDL 8 0 - 40 mg/dL   LDL Cholesterol 027 (H) 0 - 99 mg/dL    Comment:        Total Cholesterol/HDL:CHD Risk Coronary Heart Disease Risk Table  Men   Women  1/2  Average Risk   3.4   3.3  Average Risk       5.0   4.4  2 X Average Risk   9.6   7.1  3 X Average Risk  23.4   11.0        Use the calculated Patient Ratio above and the CHD Risk Table to determine the patient's CHD Risk.        ATP III CLASSIFICATION (LDL):  <100     mg/dL   Optimal  782-956  mg/dL   Near or Above                    Optimal  130-159  mg/dL   Borderline  213-086  mg/dL   High  >578     mg/dL   Very High Performed at [facility], [address] [place], [place] [address]   Hemoglobin A1c     Status: Abnormal   Collection Time: 02/19/23  2:58 AM  Result Value Ref Range   Hgb A1c MFr Bld 5.8 (H) 4.8 - 5.6 %    Comment: (NOTE) Pre diabetes:          5.7%-6.4%  Diabetes:              >6.4%  Glycemic control for   <7.0% adults with diabetes    Mean Plasma Glucose 119.76 mg/dL    Comment: Performed at [facility], [address], [place], [place] [address]  Rapid urine drug screen (hospital performed)     Status: Abnormal   Collection Time: 02/19/23  4:00 AM  Result Value Ref Range   Opiates NONE DETECTED NONE DETECTED   Cocaine POSITIVE (A) NONE DETECTED   Benzodiazepines NONE DETECTED NONE DETECTED   Amphetamines NONE DETECTED NONE DETECTED   Tetrahydrocannabinol NONE DETECTED NONE DETECTED   Barbiturates NONE DETECTED NONE DETECTED    Comment: (NOTE) DRUG SCREEN FOR MEDICAL PURPOSES ONLY.  IF CONFIRMATION IS NEEDED FOR ANY PURPOSE, NOTIFY LAB WITHIN 5 DAYS.  LOWEST DETECTABLE LIMITS FOR URINE DRUG SCREEN Drug Class                     Cutoff (ng/mL) Amphetamine and metabolites    1000 Barbiturate and metabolites    200 Benzodiazepine                 200 Opiates and metabolites        300 Cocaine and metabolites        300 THC                            50 Performed at [facility], [address], [place], [place] [address]    ECHOCARDIOGRAM COMPLETE  Result Date: 02/19/2023    ECHOCARDIOGRAM REPORT   Patient Name:    AZEEZ SECK Murrell Date of Exam: 02/19/2023 Medical Rec #:  [id number]         Height:       74.0 in Accession #:    [id number]        Weight:       195.1 lb Date of Birth:  1950-08-20         BSA:          2.151 m Patient Age:    72 years          BP:  156/86 mmHg Patient Gender: M                 HR:           83 bpm. Exam Location:  Inpatient Procedure: 2D Echo, Cardiac Doppler and Color Doppler Indications:    stroke  History:        Patient has prior history of Echocardiogram examinations, most                 recent 07/11/2021. Stroke; Risk Factors:Hypertension. Alcohol                 abuse. Cocaine abuse.  Sonographer:    Melissa Morford RDCS (AE, PE) Referring Phys: [id number] JARED M GARDNER IMPRESSIONS  1. Left ventricular ejection fraction, by estimation, is 60 to 65%. The left ventricle has normal function. The left ventricle has no regional wall motion abnormalities. There is mild concentric left ventricular hypertrophy. Left ventricular diastolic parameters are consistent with Grade I diastolic dysfunction (impaired relaxation).  2. Right ventricular systolic function is normal. The right ventricular size is normal.  3. Left atrial size was mild to moderately dilated.  4. The mitral valve is normal in structure. Trivial mitral valve regurgitation. No evidence of mitral stenosis.  5. The aortic valve is normal in structure. Aortic valve regurgitation is trivial. No aortic stenosis is present.  6. The inferior vena cava is dilated in size with <50% respiratory variability, suggesting right atrial pressure of 15 mmHg. FINDINGS  Left Ventricle: Left ventricular ejection fraction, by estimation, is 60 to 65%. The left ventricle has normal function. The left ventricle has no regional wall motion abnormalities. The left ventricular internal cavity size was normal in size. There is  mild concentric left ventricular hypertrophy. Left ventricular diastolic parameters are consistent with Grade I diastolic  dysfunction (impaired relaxation). Right Ventricle: The right ventricular size is normal. No increase in right ventricular wall thickness. Right ventricular systolic function is normal. Left Atrium: Left atrial size was mild to moderately dilated. Right Atrium: Right atrial size was normal in size. Pericardium: There is no evidence of pericardial effusion. Mitral Valve: The mitral valve is normal in structure. There is mild calcification of the mitral valve leaflet(s). Trivial mitral valve regurgitation. No evidence of mitral valve stenosis. Tricuspid Valve: The tricuspid valve is normal in structure. Tricuspid valve regurgitation is not demonstrated. No evidence of tricuspid stenosis. Aortic Valve: The aortic valve is normal in structure. Aortic valve regurgitation is trivial. No aortic stenosis is present. Pulmonic Valve: The pulmonic valve was normal in structure. Pulmonic valve regurgitation is not visualized. No evidence of pulmonic stenosis. Aorta: The aortic root is normal in size and structure. Venous: The inferior vena cava is dilated in size with less than 50% respiratory variability, suggesting right atrial pressure of 15 mmHg. IAS/Shunts: No atrial level shunt detected by color flow Doppler.  LEFT VENTRICLE PLAX 2D LVIDd:         4.20 cm      Diastology LVIDs:         3.80 cm      LV e' medial:    7.40 cm/s LV PW:         1.00 cm      LV E/e' medial:  7.7 LV IVS:        1.10 cm      LV e' lateral:   8.59 cm/s LVOT diam:     1.70 cm      LV E/e' lateral:  6.6 LV SV:         40 LV SV Index:   19 LVOT Area:     2.27 cm  LV Volumes (MOD) LV vol d, MOD A2C: 135.0 ml LV vol d, MOD A4C: 114.0 ml LV vol s, MOD A2C: 90.4 ml LV vol s, MOD A4C: 60.1 ml LV SV MOD A2C:     44.6 ml LV SV MOD A4C:     114.0 ml LV SV MOD BP:      46.9 ml RIGHT VENTRICLE RV S prime:     10.90 cm/s TAPSE (M-mode): 1.5 cm LEFT ATRIUM           Index        RIGHT ATRIUM           Index LA diam:      4.30 cm 2.00 cm/m   RA Area:     16.10  cm LA Vol (A2C): 84.2 ml 39.15 ml/m  RA Volume:   37.60 ml  17.48 ml/m LA Vol (A4C): 65.8 ml 30.59 ml/m  AORTIC VALVE LVOT Vmax:   87.20 cm/s LVOT Vmean:  67.200 cm/s LVOT VTI:    0.177 m  AORTA Ao Root diam: 3.60 cm MITRAL VALVE MV Area (PHT): 2.92 cm     SHUNTS MV Decel Time: 260 msec     Systemic VTI:  0.18 m MV E velocity: 56.70 cm/s   Systemic Diam: 1.70 cm MV A velocity: 126.00 cm/s MV E/A ratio:  0.45 Arvilla Meres MD Electronically signed by Arvilla Meres MD Signature Date/Time: 02/19/2023/8:53:48 AM    Final    EEG adult  Result Date: 02/19/2023 Charlsie Quest, MD     02/19/2023  8:28 AM Patient Name: KADRIAN MING MRN: [id number] Epilepsy Attending: Charlsie Quest Referring Physician/Provider: Hillary Bow, DO Date: 02/19/2023 Duration: 22.42 mins Patient history: 72 year old man with past history of prior strokes with residual speech deficits and walking difficulty, hypercholesterolemia, hypertension, NSVT, prior history of syncope when noncompliant to medications presenting for evaluation of an episode of unresponsiveness with some tremoring. EEG to evaluate for seizure Level of alertness: Awake, AEDs during EEG study: None Technical aspects: This EEG study was done with scalp electrodes positioned according to the 10-20 International system of electrode placement. Electrical activity was reviewed with band pass filter of 1-70Hz , sensitivity of 7 uV/mm, display speed of 35mm/sec with a 60Hz  notched filter applied as appropriate. EEG data were recorded continuously and digitally stored.  Video monitoring was available and reviewed as appropriate. Description: The posterior dominant rhythm consists of 8 Hz activity of moderate voltage (25-35 uV) seen predominantly in posterior head regions, symmetric and reactive to eye opening and eye closing. Hyperventilation and photic stimulation were not performed.   IMPRESSION: This study is within normal limits. No seizures or epileptiform  discharges were seen throughout the recording. A normal interictal EEG does not exclude the diagnosis of epilepsy. Charlsie Quest   MR BRAIN WO CONTRAST  Result Date: 02/18/2023 CLINICAL DATA:  Dysarthria EXAM: MRI HEAD WITHOUT CONTRAST TECHNIQUE: Multiplanar, multiecho pulse sequences of the brain and surrounding structures were obtained without intravenous contrast. COMPARISON:  07/04/2020 FINDINGS: Brain: There is a punctate focus of abnormal diffusion restriction within the left corona radiata. There are numerous chronic microhemorrhages in a predominantly central distribution. There is confluent hyperintense T2-weighted signal within the periventricular and deep white matter. Mild volume loss. Multiple old infarcts of the deep white matter and deep gray nuclei. No hydrocephalus.  The major midline structures are normal. Vascular: Normal flow voids. Skull and upper cervical spine: Normal bone marrow signal. Sinuses/Orbits: Paranasal sinuses are clear. No mastoid effusion. Normal orbits. Other: None IMPRESSION: 1. Punctate focus of acute ischemia within the left corona radiata. No hemorrhage or mass effect. 2. Multiple old infarcts of the deep white matter and deep gray nuclei. 3. Numerous chronic microhemorrhages in a predominantly central distribution, consistent with chronic hypertensive angiopathy. Electronically Signed   By: Deatra Robinson M.D.   On: 02/18/2023 23:18   CT HEAD WO CONTRAST  Result Date: 02/18/2023 CLINICAL DATA:  Altered mental status EXAM: CT HEAD WITHOUT CONTRAST TECHNIQUE: Contiguous axial images were obtained from the base of the skull through the vertex without intravenous contrast. RADIATION DOSE REDUCTION: This exam was performed according to the departmental dose-optimization program which includes automated exposure control, adjustment of the mA and/or kV according to patient size and/or use of iterative reconstruction technique. COMPARISON:  11/15/2021 FINDINGS: Brain: No  evidence of acute infarction, hemorrhage, hydrocephalus, extra-axial collection or mass lesion/mass effect. Chronic atrophic and ischemic changes are identified. Scattered lacunar infarcts are noted within the thalami and basal ganglia bilaterally. Vascular: No hyperdense vessel or unexpected calcification. Skull: Normal. Negative for fracture or focal lesion. Sinuses/Orbits: No acute finding. Other: None. IMPRESSION: Chronic atrophic and ischemic changes without acute abnormality. Electronically Signed   By: Alcide Clever M.D.   On: 02/18/2023 20:58   DG Chest Port 1 View  Result Date: 02/18/2023 CLINICAL DATA:  Altered level of consciousness EXAM: PORTABLE CHEST 1 VIEW COMPARISON:  11/01/2021 FINDINGS: Cardiac shadow is stable. Elevation of left hemidiaphragm is noted. No free air is seen. No focal infiltrate or effusion is noted. No bony abnormality noted. IMPRESSION: No acute abnormality noted. Electronically Signed   By: Alcide Clever M.D.   On: 02/18/2023 20:51    Pending Labs Unresulted Labs (From admission, onward)    None       Vitals/Pain Today's Vitals   02/19/23 0600 02/19/23 0645 02/19/23 0820 02/19/23 1000  BP: (!) 164/81 (!) 152/100 (!) 166/110 (!) 151/94  Pulse:  73 78 74  Resp: 17 20 18  (!) 21  Temp:   98.4 F (36.9 C)   TempSrc:      SpO2:  100% 100% 100%  Weight:      Height:      PainSc:        Isolation Precautions No active isolations  Medications Medications  sodium chloride 0.9 % bolus 1,000 mL (0 mLs Intravenous Stopped 02/18/23 1918)    And  0.9 %  sodium chloride infusion (0 mLs Intravenous Stopped 02/18/23 2017)  aspirin EC tablet 81 mg (81 mg Oral Given 02/19/23 0905)  atorvastatin (LIPITOR) tablet 80 mg (80 mg Oral Given 02/19/23 0905)   stroke: early stages of recovery book (has no administration in time range)  acetaminophen (TYLENOL) tablet 650 mg (has no administration in time range)    Or  acetaminophen (TYLENOL) 160 MG/5ML solution 650 mg (has  no administration in time range)    Or  acetaminophen (TYLENOL) suppository 650 mg (has no administration in time range)  enoxaparin (LOVENOX) injection 40 mg (40 mg Subcutaneous Given 02/19/23 0905)  amLODipine (NORVASC) tablet 10 mg (10 mg Oral Given 02/19/23 0905)  clopidogrel (PLAVIX) tablet 75 mg (75 mg Oral Given 02/19/23 0905)    Mobility walks with person assist, very unsteady balance      Focused Assessments Neuro Assessment Handoff:  Swallow screen pass? Yes  NIH Stroke Scale  Dizziness Present: No Headache Present: No Interval: Initial Level of Consciousness (1a.)   : Alert, keenly responsive LOC Questions (1b. )   : Answers both questions correctly LOC Commands (1c. )   : Performs both tasks correctly Best Gaze (2. )  : Normal Visual (3. )  : No visual loss Facial Palsy (4. )    : Normal symmetrical movements Motor Arm, Left (5a. )   : No drift Motor Arm, Right (5b. ) : No drift Motor Leg, Left (6a. )  : No drift Motor Leg, Right (6b. ) : No drift Limb Ataxia (7. ): Absent Sensory (8. )  : Normal, no sensory loss Best Language (9. )  : Mild-to-moderate aphasia Dysarthria (10. ): Mild-to-moderate dysarthria, patient slurs at least some words and, at worst, can be understood with some difficulty Extinction/Inattention (11.)   : No Abnormality Complete NIHSS TOTAL: 2     Neuro Assessment: Exceptions to WDL Neuro Checks:   Initial (02/19/23 0000)  Has TPA been given? No If patient is a Neuro Trauma and patient is going to OR before floor call report to 4N Charge nurse: [phone] or [phone]   R Recommendations: See Admitting Provider Note  Report given to:   Additional Notes:

## 2023-02-19 NOTE — Assessment & Plan Note (Signed)
4 UDS positive in 2022 and 2023. UDS from today ordered and pending. Certainly cocaine would cause uncontrolled HTN and put him at greater risk for strokes.

## 2023-02-19 NOTE — Progress Notes (Signed)
Informed by RN that patient has no complaints and resting comfortably but found to be febrile tonight with oral temperature 100.5 F and slightly tachycardic with heart rate 90-100.  Blood pressure 152/86.  Satting 100% on room air.   UA done yesterday not suggestive of infection.  Chest x-ray done yesterday not suggestive of pneumonia.  Meningitis less likely as no confusion or neck pain/stiffness reported.  Blood cultures ordered.  Stat CBC, lactate, and procalcitonin.  Tylenol as needed for fevers.

## 2023-02-19 NOTE — H&P (Signed)
History and Physical    Patient: Isaac Reyes MWU:[id number] DOB: 1951/03/11 DOA: 02/18/2023 DOS: the patient was seen and examined on 02/19/2023 PCP: Raymon Mutton., FNP  Patient coming from: Home  Chief Complaint:  Chief Complaint  Patient presents with   Seizures   HPI: Isaac Reyes is a 72 y.o. male with medical history significant of prior stroke, NSVT, HLD.  Pt with residual speech deficit and walking difficulty.  Pt in to ED after episode of unresponsiveness with some tremor at that time and gradual return to being normally interactive.  No PMH of seizures.  Of note, his prior UDS's have been positive for cocaine but he denies using cocaine when asked by neurologist.  Friend says that he is taking his medications although outpatient notes from a day or so ago report noncompliance.  He supposed to be on an aspirin which she says he takes every day.    Review of Systems: As mentioned in the history of present illness. All other systems reviewed and are negative. Past Medical History:  Diagnosis Date   Acute CVA (cerebrovascular accident) (HCC) 06/2017   "speech issues since" (07/24/2017)   AKI (acute kidney injury) (HCC) 07/24/2017   Anemia    High cholesterol    Lupus (HCC)    "skin kind"   NSVT (nonsustained ventricular tachycardia) (HCC)    Past Surgical History:  Procedure Laterality Date   KNEE ARTHROSCOPY     TONSILLECTOMY     Social History:  reports that he quit smoking about 6 years ago. His smoking use included cigars and cigarettes. He started smoking about 51 years ago. He has a 5.4 pack-year smoking history. He has never used smokeless tobacco. He reports current alcohol use of about 3.0 standard drinks of alcohol per week. He reports that he does not use drugs.  No Known Allergies  Family History  Problem Relation Age of Onset   Heart attack Father     Prior to Admission medications   Medication Sig Start Date End Date Taking?  Authorizing Provider  acetaminophen (TYLENOL) 500 MG tablet Take 2 tablets (1,000 mg total) by mouth every 6 (six) hours as needed. 07/26/20   Arby Barrette, MD  amLODipine (NORVASC) 10 MG tablet Take 1 tablet (10 mg total) by mouth daily. Patient taking differently: Take 10 mg by mouth every morning. 07/27/17   Calvert Cantor, MD  aspirin EC 81 MG tablet Take 81 mg by mouth every morning. Swallow whole.    [provider]  atorvastatin (LIPITOR) 80 MG tablet Take 80 mg by mouth daily. 10/26/21   [provider]  losartan (COZAAR) 100 MG tablet Take 100 mg by mouth daily. Patient not taking: Reported on 01/23/2022 10/26/21   [provider]  Multiple Vitamin (MULTIVITAMIN WITH MINERALS) TABS tablet Take 1 tablet by mouth every morning.    [provider]    Physical Exam: Vitals:   02/18/23 2020 02/19/23 0020 02/19/23 0025 02/19/23 0100  BP:  (!) 149/89    Pulse: 90 91    Resp: (!) 25 16 19    Temp:      TempSrc:      SpO2: 100% 99%    Weight:    88.5 kg  Height:    6\' 2"  (1.88 m)   Constitutional: NAD, calm, comfortable Respiratory: clear to auscultation bilaterally, no wheezing, no crackles. Normal respiratory effort. No accessory muscle use.  Cardiovascular: Regular rate and rhythm, no murmurs / rubs / gallops.  No extremity edema. 2+ pedal pulses. No carotid bruits.  Abdomen: no tenderness, no masses palpated. No hepatosplenomegaly. Bowel sounds positive.  Neurologic: Mild dysarthria. Psychiatric: Normal judgment and insight. Alert and oriented x 3. Normal mood.   Data Reviewed:    Labs on Admission: I have personally reviewed following labs and imaging studies  CBC: Recent Labs  Lab 02/18/23 1750  WBC 5.7  NEUTROABS 4.6  HGB 13.2  HCT 43.4  MCV 85.1  PLT 126*   Basic Metabolic Panel: Recent Labs  Lab 02/18/23 1750  NA 137  K 3.2*  CL 104  CO2 22  GLUCOSE 160*  BUN 17  CREATININE 1.43*  CALCIUM 8.1*   GFR: Estimated Creatinine  Clearance: 54.3 mL/min (A) (by C-G formula based on SCr of 1.43 mg/dL (H)). Liver Function Tests: Recent Labs  Lab 02/18/23 1750  AST 24  ALT 26  ALKPHOS 100  BILITOT 0.7  PROT 7.0  ALBUMIN 3.2*   No results for input(s): "LIPASE", "AMYLASE" in the last 168 hours. No results for input(s): "AMMONIA" in the last 168 hours. Coagulation Profile: No results for input(s): "INR", "PROTIME" in the last 168 hours. Cardiac Enzymes: No results for input(s): "CKTOTAL", "CKMB", "CKMBINDEX", "TROPONINI" in the last 168 hours. BNP (last 3 results) No results for input(s): "PROBNP" in the last 8760 hours. HbA1C: No results for input(s): "HGBA1C" in the last 72 hours. CBG: Recent Labs  Lab 02/18/23 1750  GLUCAP 147*   Lipid Profile: No results for input(s): "CHOL", "HDL", "LDLCALC", "TRIG", "CHOLHDL", "LDLDIRECT" in the last 72 hours. Thyroid Function Tests: No results for input(s): "TSH", "T4TOTAL", "FREET4", "T3FREE", "THYROIDAB" in the last 72 hours. Anemia Panel: No results for input(s): "VITAMINB12", "FOLATE", "FERRITIN", "TIBC", "IRON", "RETICCTPCT" in the last 72 hours. Urine analysis:    Component Value Date/Time   COLORURINE YELLOW 02/18/2023 2049   APPEARANCEUR CLEAR 02/18/2023 2049   LABSPEC 1.009 02/18/2023 2049   PHURINE 7.0 02/18/2023 2049   GLUCOSEU NEGATIVE 02/18/2023 2049   HGBUR NEGATIVE 02/18/2023 2049   BILIRUBINUR NEGATIVE 02/18/2023 2049   KETONESUR NEGATIVE 02/18/2023 2049   PROTEINUR NEGATIVE 02/18/2023 2049   NITRITE NEGATIVE 02/18/2023 2049   LEUKOCYTESUR NEGATIVE 02/18/2023 2049    Radiological Exams on Admission: MR BRAIN WO CONTRAST  Result Date: 02/18/2023 CLINICAL DATA:  Dysarthria EXAM: MRI HEAD WITHOUT CONTRAST TECHNIQUE: Multiplanar, multiecho pulse sequences of the brain and surrounding structures were obtained without intravenous contrast. COMPARISON:  07/04/2020 FINDINGS: Brain: There is a punctate focus of abnormal diffusion restriction within  the left corona radiata. There are numerous chronic microhemorrhages in a predominantly central distribution. There is confluent hyperintense T2-weighted signal within the periventricular and deep white matter. Mild volume loss. Multiple old infarcts of the deep white matter and deep gray nuclei. No hydrocephalus. The major midline structures are normal. Vascular: Normal flow voids. Skull and upper cervical spine: Normal bone marrow signal. Sinuses/Orbits: Paranasal sinuses are clear. No mastoid effusion. Normal orbits. Other: None IMPRESSION: 1. Punctate focus of acute ischemia within the left corona radiata. No hemorrhage or mass effect. 2. Multiple old infarcts of the deep white matter and deep gray nuclei. 3. Numerous chronic microhemorrhages in a predominantly central distribution, consistent with chronic hypertensive angiopathy. Electronically Signed   By: Deatra Robinson M.D.   On: 02/18/2023 23:18   CT HEAD WO CONTRAST  Result Date: 02/18/2023 CLINICAL DATA:  Altered mental status EXAM: CT HEAD WITHOUT CONTRAST TECHNIQUE: Contiguous axial images were obtained from the base of the skull through  the vertex without intravenous contrast. RADIATION DOSE REDUCTION: This exam was performed according to the departmental dose-optimization program which includes automated exposure control, adjustment of the mA and/or kV according to patient size and/or use of iterative reconstruction technique. COMPARISON:  11/15/2021 FINDINGS: Brain: No evidence of acute infarction, hemorrhage, hydrocephalus, extra-axial collection or mass lesion/mass effect. Chronic atrophic and ischemic changes are identified. Scattered lacunar infarcts are noted within the thalami and basal ganglia bilaterally. Vascular: No hyperdense vessel or unexpected calcification. Skull: Normal. Negative for fracture or focal lesion. Sinuses/Orbits: No acute finding. Other: None. IMPRESSION: Chronic atrophic and ischemic changes without acute abnormality.  Electronically Signed   By: Alcide Clever M.D.   On: 02/18/2023 20:58   DG Chest Port 1 View  Result Date: 02/18/2023 CLINICAL DATA:  Altered level of consciousness EXAM: PORTABLE CHEST 1 VIEW COMPARISON:  11/01/2021 FINDINGS: Cardiac shadow is stable. Elevation of left hemidiaphragm is noted. No free air is seen. No focal infiltrate or effusion is noted. No bony abnormality noted. IMPRESSION: No acute abnormality noted. Electronically Signed   By: Alcide Clever M.D.   On: 02/18/2023 20:51    EKG: Independently reviewed.   Assessment and Plan: * Acute ischemic stroke (HCC) + or - seizure today.  MRI confirms small punctate acute stroke. H/o prior stroke. Stroke pathway See neuro consult note Tele monitor 2d echo EEG ASA 81 Normo tension per neurology given tiny size of infarct PT/OT/SLP FLP Continue / restart statin.  History of cocaine abuse (HCC) 4 UDS positive in 2022 and 2023. UDS from today ordered and pending. Certainly cocaine would cause uncontrolled HTN and put him at greater risk for strokes.  Benign essential HTN Goal per neuro = normotension Unclear if pt taking home BP meds or not. Start / resume norvasc 10 Add additional agents as needed during stay.      Advance Care Planning:   Code Status: Do not attempt resuscitation (DNR) PRE-ARREST INTERVENTIONS DESIRED   Consults: Neurology  Family Communication: No family in room  Severity of Illness: The appropriate patient status for this patient is OBSERVATION. Observation status is judged to be reasonable and necessary in order to provide the required intensity of service to ensure the patient's safety. The patient's presenting symptoms, physical exam findings, and initial radiographic and laboratory data in the context of their medical condition is felt to place them at decreased risk for further clinical deterioration. Furthermore, it is anticipated that the patient will be medically stable for discharge from the  hospital within 2 midnights of admission.   Author: Hillary Bow., DO 02/19/2023 1:59 AM  For on call review [email or web address].

## 2023-02-19 NOTE — Progress Notes (Signed)
EEG complete - results pending 

## 2023-02-19 NOTE — Progress Notes (Signed)
Inpatient Rehab Admissions Coordinator Note:   Per PT recommendations patient was screened for CIR candidacy by Stephania Fragmin, PT. At this time, pt appears to be a potential candidate for CIR. I will place an order for rehab consult for full assessment, per our protocol.  Please contact me any with questions.Estill Dooms, PT, DPT [phone] 02/19/23 1:47 PM

## 2023-02-19 NOTE — Evaluation (Signed)
Physical Therapy Evaluation Patient Details Name: Isaac Reyes MRN: [id number] DOB: March 09, 1951 Today's Date: 02/19/2023  History of Present Illness  72yo M who presented to the ED on 10/1 due to episode of unresponsiveness and tremor. 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  EEG WNL. PMH CVA 2019 with residual speech issues, AKI, HLD, lupus, NSVT, knee arthroscopy  Clinical Impression   Pt received in ED stretcher with LEs propped on bed rail, does endorse feeling weaker than  his usual. Needed heavy physical assistance today up to ModA with RW, poor awareness of and insight into all deficits but mobility seems quite different than his reported baseline of being fully independent without AD. Balance poor today with strong posterior lean in standing. Left positioned to comfort on ED stretcher with RN aware of pt status, will need +2 to safely progress mobility. Definitely in need of intensive skilled PT f/u when medically ready for DC from the hospital.       If plan is discharge home, recommend the following: Two people to help with walking and/or transfers;Assistance with cooking/housework;A lot of help with bathing/dressing/bathroom;Direct supervision/assist for medications management;Assist for transportation;A lot of help with walking and/or transfers;Two people to help with bathing/dressing/bathroom;Help with stairs or ramp for entrance   Can travel by private vehicle        Equipment Recommendations Rolling walker (2 wheels);BSC/3in1;Wheelchair (measurements PT);Wheelchair cushion (measurements PT)  Recommendations for Other Services  Rehab consult    Functional Status Assessment Patient has had a recent decline in their functional status and demonstrates the ability to make significant improvements in function in a reasonable and predictable amount of time.     Precautions /  Restrictions Precautions Precautions: Fall;Other (comment) Precaution Comments: neuro recommending normotension not permissive HTN Restrictions Weight Bearing Restrictions: No      Mobility  Bed Mobility Overal bed mobility: Needs Assistance Bed Mobility: Supine to Sit, Sit to Supine     Supine to sit: Min assist, HOB elevated Sit to supine: Contact guard assist   General bed mobility comments: MinA to bring trunk to full upright from ED stretcher and gain balance- had posterior lean at first, then min guard to return to supine in ED stretcher    Transfers Overall transfer level: Needs assistance Equipment used: Rolling walker (2 wheels) Transfers: Sit to/from Stand Sit to Stand: Mod assist, From elevated surface           General transfer comment: ModA to boost to full upright and then to steady and bring COM anterior- was leaning hard against ED stretcher and had moderate sway when not leaning against it    Ambulation/Gait               General Gait Details: will need +2 for safety to progress gait- able to sidestep along ED stretcher today with modA for balance and RW management  Stairs            Wheelchair Mobility     Tilt Bed    Modified Rankin (Stroke Patients Only)       Balance Overall balance assessment: Needs assistance Sitting-balance support: Feet supported, Bilateral upper extremity supported Sitting balance-Leahy Scale: Poor Sitting balance - Comments: initial posterior lean, MinA and Mod VC to correct to midline Postural control: Posterior lean Standing balance support: During functional activity, Reliant on assistive device for balance, Bilateral upper extremity supported Standing balance-Leahy Scale:  Poor                               Pertinent Vitals/Pain Pain Assessment Pain Assessment: No/denies pain    Home Living Family/patient expects to be discharged to:: Private residence Living Arrangements:  Alone Available Help at Discharge: Other (Comment) (no assistance available) Type of Home: Apartment Home Access: Stairs to enter Entrance Stairs-Rails: Can reach both Entrance Stairs-Number of Steps: 4   Home Layout: One level Home Equipment: Cane - single point Additional Comments: walks with no AD usually    Prior Function                       Extremity/Trunk Assessment   Upper Extremity Assessment Upper Extremity Assessment: Defer to OT evaluation    Lower Extremity Assessment Lower Extremity Assessment: Generalized weakness    Cervical / Trunk Assessment Cervical / Trunk Assessment: Normal  Communication   Communication Communication: No apparent difficulties Cueing Techniques: Verbal cues;Tactile cues  Cognition Arousal: Alert Behavior During Therapy: WFL for tasks assessed/performed, Flat affect                                            General Comments      Exercises     Assessment/Plan    PT Assessment Patient needs continued PT services  PT Problem List Decreased strength;Decreased balance;Decreased mobility;Decreased knowledge of use of DME;Decreased activity tolerance;Decreased coordination;Decreased safety awareness       PT Treatment Interventions DME instruction;Therapeutic activities;Gait training;Therapeutic exercise;Patient/family education;Stair training;Balance training;Wheelchair mobility training;Functional mobility training;Neuromuscular re-education    PT Goals (Current goals can be found in the Care Plan section)  Acute Rehab PT Goals Patient Stated Goal: go home today PT Goal Formulation: With patient Time For Goal Achievement: 03/05/23 Potential to Achieve Goals: Good    Frequency Min 1X/week     Co-evaluation               AM-PAC PT "6 Clicks" Mobility  Outcome Measure Help needed turning from your back to your side while in a flat bed without using bedrails?: A Little Help needed moving  from lying on your back to sitting on the side of a flat bed without using bedrails?: A Lot Help needed moving to and from a bed to a chair (including a wheelchair)?: A Lot Help needed standing up from a chair using your arms (e.g., wheelchair or bedside chair)?: A Lot Help needed to walk in hospital room?: Total Help needed climbing 3-5 steps with a railing? : Total 6 Click Score: 11    End of Session Equipment Utilized During Treatment: Gait belt Activity Tolerance: Patient tolerated treatment well Patient left: in bed;with call bell/phone within reach;Other (comment) (ED stretcher) Nurse Communication: Mobility status PT Visit Diagnosis: Unsteadiness on feet (R26.81);Muscle weakness (generalized) (M62.81);Difficulty in walking, not elsewhere classified (R26.2);Other abnormalities of gait and mobility (R26.89)    Time: 1610-9604 PT Time Calculation (min) (ACUTE ONLY): 13 min   Charges:   PT Evaluation $PT Eval Moderate Complexity: 1 Mod   PT General Charges $$ ACUTE PT VISIT: 1 Visit        Nedra Hai, PT, DPT 02/19/23 10:47 AM

## 2023-02-20 ENCOUNTER — Inpatient Hospital Stay (HOSPITAL_COMMUNITY): Payer: 59

## 2023-02-20 DIAGNOSIS — I639 Cerebral infarction, unspecified: Secondary | ICD-10-CM | POA: Diagnosis not present

## 2023-02-20 DIAGNOSIS — R569 Unspecified convulsions: Secondary | ICD-10-CM | POA: Diagnosis not present

## 2023-02-20 LAB — BASIC METABOLIC PANEL
Anion gap: 11 (ref 5–15)
BUN: 13 mg/dL (ref 8–23)
CO2: 20 mmol/L — ABNORMAL LOW (ref 22–32)
Calcium: 8.4 mg/dL — ABNORMAL LOW (ref 8.9–10.3)
Chloride: 109 mmol/L (ref 98–111)
Creatinine, Ser: 1.22 mg/dL (ref 0.61–1.24)
GFR, Estimated: >60 mL/min (ref >60)
Glucose, Bld: 106 mg/dL — ABNORMAL HIGH (ref 70–99)
Potassium: 3.5 mmol/L (ref 3.5–5.1)
Sodium: 140 mmol/L (ref 135–145)

## 2023-02-20 LAB — MAGNESIUM: Magnesium: 2.1 mg/dL (ref 1.7–2.4)

## 2023-02-20 NOTE — Progress Notes (Signed)
Patient got out of chair without and attempted to crawl over the side rail back into the bed. He was attached to IV line and condom catheter.  Untangled patient and assisted to the bed.  Reinforced education concerning calling for assistance.  Patient verbalized understanding.  Call bell at patient's side.  Bed alarm active.

## 2023-02-20 NOTE — Progress Notes (Signed)
PLT 113 (L) 02/19/2023 2141   MCV 81.6 02/19/2023 2141   MCH 25.8 (L) 02/19/2023 2141   MCHC 31.6 02/19/2023 2141   RDW 16.4 (H) 02/19/2023 2141   LYMPHSABS 0.6 (L) 02/18/2023 1750   MONOABS 0.4 02/18/2023 1750   EOSABS 0.0 02/18/2023 1750   BASOSABS 0.0 02/18/2023 1750      Latest Ref Rng & Units 02/20/2023    5:12 AM 02/18/2023    5:50 PM 11/15/2021    6:41 PM  BMP  Glucose 70 - 99 mg/dL 409  811  914   BUN 8 - 23 mg/dL 13  17  25    Creatinine 0.61 - 1.24 mg/dL 7.82  9.56  2.13   Sodium 135 - 145 mmol/L 140  137  141   Potassium 3.5 - 5.1  mmol/L 3.5  3.2  3.6   Chloride 98 - 111 mmol/L 109  104  107   CO2 22 - 32 mmol/L 20  22  25    Calcium 8.9 - 10.3 mg/dL 8.4  8.1  9.0     MR ANGIO HEAD WO CONTRAST  Result Date: 02/19/2023 CLINICAL DATA:  Seizures EXAM: MRA HEAD WITHOUT CONTRAST TECHNIQUE: Angiographic images of the Circle of Willis were acquired using MRA technique without intravenous contrast. 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 The intracranial ICAs are patent, without significant stenosis or occlusion. The bilateral MCAs and ACAS are patent, without proximal stenosis or occlusion. The anterior communicating artery is not definitely seen. There is no aneurysm or AVM. Posterior circulation: The bilateral V4 segments are patent. The basilar artery is patent. The major cerebellar arteries appear patent. The bilateral PCAs are patent, without proximal stenosis or occlusion. The PCAs are primarily supplied by prominent posterior communicating arteries bilaterally (fetal origins). There is no aneurysm or AVM. Anatomic variants: As above. Other: None. IMPRESSION: 1. 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 2. Otherwise unremarkable intracranial MRA. Electronically Signed   By: Lesia Hausen M.D.   On: 02/19/2023 18:17   ECHOCARDIOGRAM COMPLETE  Result Date: 02/19/2023    ECHOCARDIOGRAM REPORT   Patient Name:   Isaac Reyes Date of Exam: 02/19/2023 Medical Rec #:  [id number]         Height:       74.0 in Accession #:    [id number]        Weight:       195.1 lb Date of Birth:  1950/06/23         BSA:          2.151 m Patient Age:    72 years          BP:           156/86 mmHg Patient Gender: M                 HR:           83 bpm. Exam Location:  Inpatient Procedure: 2D Echo, Cardiac Doppler and Color Doppler Indications:    stroke   History:        Patient has prior history of Echocardiogram examinations, most                 recent 07/11/2021. Stroke; Risk Factors:Hypertension. Alcohol  PROGRESS NOTE  Isaac Reyes    DOB: [birth year], 72 y.o.  YQM:[id number]    Code Status: Do not attempt resuscitation (DNR) PRE-ARREST INTERVENTIONS DESIRED   DOA: 02/18/2023   LOS: 1   Brief hospital course  Isaac Reyes is a 72 y.o. male with medical history significant of prior stroke, NSVT, HLD, substance use. Pt with residual speech deficit and walking difficulty.  Presented to the ED after episode of unresponsiveness with some tremor at that time and gradual return to being normally interactive. No PMH of seizures..  In the ED, it was found that they had stable vital signs ORA.  Significant findings included LDL 118, A1c 5.8, CBC unremarkable. UDS positive for cocaine. Blood cultures collected. Normal LA and procalcitonin. No metabolic panel was ordered in the ED.   They were initially treated with IV fluids, tylenol, and home meds.   Patient was admitted to medicine service for further workup and management of AMS as outlined in detail below.  Echo- unremarkable. EEG negative. Telemetry negative for etiology.   02/20/23 -carotid dopplers remaining. PT recommending CIR.   Assessment & Plan  Principal Problem:   Acute ischemic stroke [facility]) Active Problems:   Benign essential HTN   History of cocaine abuse (HCC)   Stroke (HCC)  Acute ischemic stroke (HCC)-  MRI confirms small punctate acute stroke. H/o prior stroke. Neurology evaluated. Less likely cause of is LOC episode and more likely related to cocaine.  - f/u carotid US - PT/OT  - awaiting CIR ASA 81, plavix Continue statin, zetia   History of cocaine abuse (HCC) - counseling provided   Benign essential HTN Continue  norvasc 10  Body mass index is 25.05 kg/m.  VTE ppx: enoxaparin (LOVENOX) injection 40 mg Start: 02/19/23 1000   Diet:     Diet   Diet Heart Fluid consistency: Thin   Consultants: Neurology  Cardiology   Subjective 02/20/23    Pt reports feeling well today. He wants to go  home. Denies complaints.    Objective   Vitals:   02/19/23 1944 02/19/23 2000 02/19/23 2322 02/20/23 0321  BP: (!) 171/100 (!) 152/86 (!) 153/88 (!) 157/112  Pulse: (!) 108 93 79 92  Resp: 18  18 18   Temp: (!) 100.5 F (38.1 C) (!) 100.4 F (38 C) 100.1 F (37.8 C) (!) 100.7 F (38.2 C)  TempSrc: Oral Oral Oral Oral  SpO2: 98% 100% 98% 100%  Weight:      Height:        Intake/Output Summary (Last 24 hours) at 02/20/2023 0743 Last data filed at 02/20/2023 0343 Gross per 24 hour  Intake 214.74 ml  Output 1450 ml  Net -1235.26 ml   Filed Weights   02/19/23 0100  Weight: 88.5 kg     Physical Exam:  General: awake, alert, NAD HEENT: atraumatic, clear conjunctiva, anicteric sclera, MMM, hearing grossly normal Respiratory: normal respiratory effort. Cardiovascular: quick capillary refill, normal S1/S2, RRR, no JVD, murmurs Gastrointestinal: soft, NT, ND Nervous: A&O x3. no gross focal neurologic deficits, normal speech Extremities: moves all equally, no edema, normal tone Skin: dry, intact, normal temperature, normal color. No rashes, lesions or ulcers on exposed skin Psychiatry: normal mood, congruent affect  Labs   I have personally reviewed the following labs and imaging studies CBC    Component Value Date/Time   WBC 7.0 02/19/2023 2141   RBC 5.54 02/19/2023 2141   HGB 14.3 02/19/2023 2141   HCT 45.2 02/19/2023 2141  PROGRESS NOTE  Isaac Reyes    DOB: [birth year], 72 y.o.  YQM:[id number]    Code Status: Do not attempt resuscitation (DNR) PRE-ARREST INTERVENTIONS DESIRED   DOA: 02/18/2023   LOS: 1   Brief hospital course  Isaac Reyes is a 72 y.o. male with medical history significant of prior stroke, NSVT, HLD, substance use. Pt with residual speech deficit and walking difficulty.  Presented to the ED after episode of unresponsiveness with some tremor at that time and gradual return to being normally interactive. No PMH of seizures..  In the ED, it was found that they had stable vital signs ORA.  Significant findings included LDL 118, A1c 5.8, CBC unremarkable. UDS positive for cocaine. Blood cultures collected. Normal LA and procalcitonin. No metabolic panel was ordered in the ED.   They were initially treated with IV fluids, tylenol, and home meds.   Patient was admitted to medicine service for further workup and management of AMS as outlined in detail below.  Echo- unremarkable. EEG negative. Telemetry negative for etiology.   02/20/23 -carotid dopplers remaining. PT recommending CIR.   Assessment & Plan  Principal Problem:   Acute ischemic stroke [facility]) Active Problems:   Benign essential HTN   History of cocaine abuse (HCC)   Stroke (HCC)  Acute ischemic stroke (HCC)-  MRI confirms small punctate acute stroke. H/o prior stroke. Neurology evaluated. Less likely cause of is LOC episode and more likely related to cocaine.  - f/u carotid US - PT/OT  - awaiting CIR ASA 81, plavix Continue statin, zetia   History of cocaine abuse (HCC) - counseling provided   Benign essential HTN Continue  norvasc 10  Body mass index is 25.05 kg/m.  VTE ppx: enoxaparin (LOVENOX) injection 40 mg Start: 02/19/23 1000   Diet:     Diet   Diet Heart Fluid consistency: Thin   Consultants: Neurology  Cardiology   Subjective 02/20/23    Pt reports feeling well today. He wants to go  home. Denies complaints.    Objective   Vitals:   02/19/23 1944 02/19/23 2000 02/19/23 2322 02/20/23 0321  BP: (!) 171/100 (!) 152/86 (!) 153/88 (!) 157/112  Pulse: (!) 108 93 79 92  Resp: 18  18 18   Temp: (!) 100.5 F (38.1 C) (!) 100.4 F (38 C) 100.1 F (37.8 C) (!) 100.7 F (38.2 C)  TempSrc: Oral Oral Oral Oral  SpO2: 98% 100% 98% 100%  Weight:      Height:        Intake/Output Summary (Last 24 hours) at 02/20/2023 0743 Last data filed at 02/20/2023 0343 Gross per 24 hour  Intake 214.74 ml  Output 1450 ml  Net -1235.26 ml   Filed Weights   02/19/23 0100  Weight: 88.5 kg     Physical Exam:  General: awake, alert, NAD HEENT: atraumatic, clear conjunctiva, anicteric sclera, MMM, hearing grossly normal Respiratory: normal respiratory effort. Cardiovascular: quick capillary refill, normal S1/S2, RRR, no JVD, murmurs Gastrointestinal: soft, NT, ND Nervous: A&O x3. no gross focal neurologic deficits, normal speech Extremities: moves all equally, no edema, normal tone Skin: dry, intact, normal temperature, normal color. No rashes, lesions or ulcers on exposed skin Psychiatry: normal mood, congruent affect  Labs   I have personally reviewed the following labs and imaging studies CBC    Component Value Date/Time   WBC 7.0 02/19/2023 2141   RBC 5.54 02/19/2023 2141   HGB 14.3 02/19/2023 2141   HCT 45.2 02/19/2023 2141  PROGRESS NOTE  Isaac Reyes    DOB: [birth year], 72 y.o.  YQM:[id number]    Code Status: Do not attempt resuscitation (DNR) PRE-ARREST INTERVENTIONS DESIRED   DOA: 02/18/2023   LOS: 1   Brief hospital course  Isaac Reyes is a 72 y.o. male with medical history significant of prior stroke, NSVT, HLD, substance use. Pt with residual speech deficit and walking difficulty.  Presented to the ED after episode of unresponsiveness with some tremor at that time and gradual return to being normally interactive. No PMH of seizures..  In the ED, it was found that they had stable vital signs ORA.  Significant findings included LDL 118, A1c 5.8, CBC unremarkable. UDS positive for cocaine. Blood cultures collected. Normal LA and procalcitonin. No metabolic panel was ordered in the ED.   They were initially treated with IV fluids, tylenol, and home meds.   Patient was admitted to medicine service for further workup and management of AMS as outlined in detail below.  Echo- unremarkable. EEG negative. Telemetry negative for etiology.   02/20/23 -carotid dopplers remaining. PT recommending CIR.   Assessment & Plan  Principal Problem:   Acute ischemic stroke [facility]) Active Problems:   Benign essential HTN   History of cocaine abuse (HCC)   Stroke (HCC)  Acute ischemic stroke (HCC)-  MRI confirms small punctate acute stroke. H/o prior stroke. Neurology evaluated. Less likely cause of is LOC episode and more likely related to cocaine.  - f/u carotid US - PT/OT  - awaiting CIR ASA 81, plavix Continue statin, zetia   History of cocaine abuse (HCC) - counseling provided   Benign essential HTN Continue  norvasc 10  Body mass index is 25.05 kg/m.  VTE ppx: enoxaparin (LOVENOX) injection 40 mg Start: 02/19/23 1000   Diet:     Diet   Diet Heart Fluid consistency: Thin   Consultants: Neurology  Cardiology   Subjective 02/20/23    Pt reports feeling well today. He wants to go  home. Denies complaints.    Objective   Vitals:   02/19/23 1944 02/19/23 2000 02/19/23 2322 02/20/23 0321  BP: (!) 171/100 (!) 152/86 (!) 153/88 (!) 157/112  Pulse: (!) 108 93 79 92  Resp: 18  18 18   Temp: (!) 100.5 F (38.1 C) (!) 100.4 F (38 C) 100.1 F (37.8 C) (!) 100.7 F (38.2 C)  TempSrc: Oral Oral Oral Oral  SpO2: 98% 100% 98% 100%  Weight:      Height:        Intake/Output Summary (Last 24 hours) at 02/20/2023 0743 Last data filed at 02/20/2023 0343 Gross per 24 hour  Intake 214.74 ml  Output 1450 ml  Net -1235.26 ml   Filed Weights   02/19/23 0100  Weight: 88.5 kg     Physical Exam:  General: awake, alert, NAD HEENT: atraumatic, clear conjunctiva, anicteric sclera, MMM, hearing grossly normal Respiratory: normal respiratory effort. Cardiovascular: quick capillary refill, normal S1/S2, RRR, no JVD, murmurs Gastrointestinal: soft, NT, ND Nervous: A&O x3. no gross focal neurologic deficits, normal speech Extremities: moves all equally, no edema, normal tone Skin: dry, intact, normal temperature, normal color. No rashes, lesions or ulcers on exposed skin Psychiatry: normal mood, congruent affect  Labs   I have personally reviewed the following labs and imaging studies CBC    Component Value Date/Time   WBC 7.0 02/19/2023 2141   RBC 5.54 02/19/2023 2141   HGB 14.3 02/19/2023 2141   HCT 45.2 02/19/2023 2141  PROGRESS NOTE  Isaac Reyes    DOB: [birth year], 72 y.o.  YQM:[id number]    Code Status: Do not attempt resuscitation (DNR) PRE-ARREST INTERVENTIONS DESIRED   DOA: 02/18/2023   LOS: 1   Brief hospital course  Isaac Reyes is a 72 y.o. male with medical history significant of prior stroke, NSVT, HLD, substance use. Pt with residual speech deficit and walking difficulty.  Presented to the ED after episode of unresponsiveness with some tremor at that time and gradual return to being normally interactive. No PMH of seizures..  In the ED, it was found that they had stable vital signs ORA.  Significant findings included LDL 118, A1c 5.8, CBC unremarkable. UDS positive for cocaine. Blood cultures collected. Normal LA and procalcitonin. No metabolic panel was ordered in the ED.   They were initially treated with IV fluids, tylenol, and home meds.   Patient was admitted to medicine service for further workup and management of AMS as outlined in detail below.  Echo- unremarkable. EEG negative. Telemetry negative for etiology.   02/20/23 -carotid dopplers remaining. PT recommending CIR.   Assessment & Plan  Principal Problem:   Acute ischemic stroke [facility]) Active Problems:   Benign essential HTN   History of cocaine abuse (HCC)   Stroke (HCC)  Acute ischemic stroke (HCC)-  MRI confirms small punctate acute stroke. H/o prior stroke. Neurology evaluated. Less likely cause of is LOC episode and more likely related to cocaine.  - f/u carotid US - PT/OT  - awaiting CIR ASA 81, plavix Continue statin, zetia   History of cocaine abuse (HCC) - counseling provided   Benign essential HTN Continue  norvasc 10  Body mass index is 25.05 kg/m.  VTE ppx: enoxaparin (LOVENOX) injection 40 mg Start: 02/19/23 1000   Diet:     Diet   Diet Heart Fluid consistency: Thin   Consultants: Neurology  Cardiology   Subjective 02/20/23    Pt reports feeling well today. He wants to go  home. Denies complaints.    Objective   Vitals:   02/19/23 1944 02/19/23 2000 02/19/23 2322 02/20/23 0321  BP: (!) 171/100 (!) 152/86 (!) 153/88 (!) 157/112  Pulse: (!) 108 93 79 92  Resp: 18  18 18   Temp: (!) 100.5 F (38.1 C) (!) 100.4 F (38 C) 100.1 F (37.8 C) (!) 100.7 F (38.2 C)  TempSrc: Oral Oral Oral Oral  SpO2: 98% 100% 98% 100%  Weight:      Height:        Intake/Output Summary (Last 24 hours) at 02/20/2023 0743 Last data filed at 02/20/2023 0343 Gross per 24 hour  Intake 214.74 ml  Output 1450 ml  Net -1235.26 ml   Filed Weights   02/19/23 0100  Weight: 88.5 kg     Physical Exam:  General: awake, alert, NAD HEENT: atraumatic, clear conjunctiva, anicteric sclera, MMM, hearing grossly normal Respiratory: normal respiratory effort. Cardiovascular: quick capillary refill, normal S1/S2, RRR, no JVD, murmurs Gastrointestinal: soft, NT, ND Nervous: A&O x3. no gross focal neurologic deficits, normal speech Extremities: moves all equally, no edema, normal tone Skin: dry, intact, normal temperature, normal color. No rashes, lesions or ulcers on exposed skin Psychiatry: normal mood, congruent affect  Labs   I have personally reviewed the following labs and imaging studies CBC    Component Value Date/Time   WBC 7.0 02/19/2023 2141   RBC 5.54 02/19/2023 2141   HGB 14.3 02/19/2023 2141   HCT 45.2 02/19/2023 2141

## 2023-02-20 NOTE — Progress Notes (Signed)
Lower extremity venous duplex completed. Please see CV Procedures for preliminary results.  Shona Simpson, RVT 02/20/23 3:40 PM

## 2023-02-20 NOTE — TOC Initial Note (Signed)
Transition of Care [facility]) - Initial/Assessment Note    Patient Details  Name: Isaac Reyes MRN: [id number] Date of Birth: July 07, 1950  Transition of Care [facility]) CM/SW Contact:    Kermit Balo, RN Phone Number: 02/20/2023, 10:29 AM  Clinical Narrative:                  Patient is from home with a friend. He states they are together most of the time.  He says his friend, Aggie Cosier provides needed transportation. Pt manages his own medications and denies any issues.  Current recommendations are for CIR. Awaiting CIR eval.  TOC following.   Expected Discharge Plan: IP Rehab Facility Barriers to Discharge: Continued Medical Work up   Patient Goals and CMS Choice   CMS Medicare.gov Compare Post Acute Care list provided to:: Patient Choice offered to / list presented to : Patient      Expected Discharge Plan and Services   Discharge Planning Services: CM Consult Post Acute Care Choice: IP Rehab Living arrangements for the past 2 months: Apartment                                      Prior Living Arrangements/Services Living arrangements for the past 2 months: Apartment Lives with:: Friends Patient language and need for interpreter reviewed:: Yes Do you feel safe going back to the place where you live?: Yes        Care giver support system in place?: Yes (comment) Current home services: DME (walker that is broken) Criminal Activity/Legal Involvement Pertinent to Current Situation/Hospitalization: No - Comment as needed  Activities of Daily Living   ADL Screening (condition at time of admission) Independently performs ADLs?: No Does the patient have a NEW difficulty with bathing/dressing/toileting/self-feeding that is expected to last >3 days?: Yes (Initiates electronic notice to provider for possible OT consult) Does the patient have a NEW difficulty with getting in/out of bed, walking, or climbing stairs that is expected to last >3 days?: Yes (Initiates  electronic notice to provider for possible PT consult) Does the patient have a NEW difficulty with communication that is expected to last >3 days?: No Is the patient deaf or have difficulty hearing?: No Does the patient have difficulty seeing, even when wearing glasses/contacts?: No Does the patient have difficulty concentrating, remembering, or making decisions?: No  Permission Sought/Granted                  Emotional Assessment Appearance:: Appears stated age Attitude/Demeanor/Rapport: Engaged Affect (typically observed): Accepting Orientation: : Oriented to Self, Oriented to Place, Oriented to  Time, Oriented to Situation   Psych Involvement: No (comment)  Admission diagnosis:  Acute ischemic stroke (HCC) [I63.9] Seizure-like activity (HCC) [R56.9] Acute CVA (cerebrovascular accident) (HCC) [I63.9] Stroke [facility]) [I63.9] Patient Active Problem List   Diagnosis Date Noted   Acute ischemic stroke (HCC) 02/19/2023   History of cocaine abuse (HCC) 02/19/2023   Stroke (HCC) 02/19/2023   Syncope and collapse 07/01/2020   Syncope 11/07/2017   Benign essential HTN 07/26/2017   Acute kidney failure (HCC) 07/24/2017   CVA (cerebral vascular accident) (HCC) 07/19/2017   Stroke (cerebrum) (HCC) 07/18/2017   NSVT (nonsustained ventricular tachycardia) (HCC) 07/18/2017   Elevated blood pressure reading 07/18/2017   Normocytic normochromic anemia 07/18/2017   Malnutrition of moderate degree 07/18/2017   Normocytic anemia    Frequent falls    Renal insufficiency  Hyperlipidemia    Heavy smoker    Alcohol abuse    Abnormal EKG 03/19/2013   Pre-operative cardiovascular exam, new EKG abnormalities c/w ischemia 03/19/2013   LVH (left ventricular hypertrophy) 03/19/2013   Murmur, cardiac 03/19/2013   PCP:  Raymon Mutton., FNP Pharmacy:   [facility] [place], [place] - [address] [place] [address] Phone: [phone] Fax:  [phone]     Social Determinants of Health (SDOH) Social History: SDOH Screenings   Food Insecurity: No Food Insecurity (02/19/2023)  Housing: Low Risk  (02/19/2023)  Transportation Needs: No Transportation Needs (02/19/2023)  Utilities: Not At Risk (02/19/2023)  Tobacco Use: Medium Risk (02/18/2023)   SDOH Interventions:     Readmission Risk Interventions     No data to display

## 2023-02-20 NOTE — Progress Notes (Signed)
Physical Therapy Treatment Patient Details Name: Isaac Reyes MRN: [id number] DOB: 11-Nov-1950 Today's Date: 02/20/2023   History of Present Illness 72yo M who presented to the ED on 10/1 due to episode of unresponsiveness and tremor. 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  EEG WNL. 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 Provides inconsistent information regarding his prior functional status (?used RW PTA?). Continues to require +2 mod assist for sit to stand due to strong posterior bias. Able to progress to ambulating with RW with min assist with poor use of RW at times (left foot stepping outside frame of RW). Will continue to benefit from skilled PT to incr independence.     If plan is discharge home, recommend the following: Two people to help with walking and/or transfers;Direct supervision/assist for medications management;Direct supervision/assist for financial management;Assist for transportation;Help with stairs or ramp for entrance   Can travel by private vehicle        Equipment Recommendations  Rolling walker (2 wheels);BSC/3in1;Wheelchair (measurements PT);Wheelchair cushion (measurements PT)    Recommendations for Other Services       Precautions / Restrictions Precautions Precautions: Fall;Other (comment) Precaution Comments: neuro recommending normotension not permissive HTN Restrictions Weight Bearing Restrictions: No     Mobility  Bed Mobility Overal bed mobility: Needs Assistance Bed Mobility: Supine to Sit     Supine to sit: Contact guard     General bed mobility comments: incr time, HOB slightly elevated    Transfers Overall transfer level: Needs assistance Equipment used: Rolling walker (2 wheels) Transfers: Sit to/from Stand Sit to Stand: Mod  assist, +2 physical assistance           General transfer comment: posterior lean requiring incr assist to come anteriorly over BOS    Ambulation/Gait Ambulation/Gait assistance: Min assist Gait Distance (Feet): 40 Feet Assistive device: Rolling walker (2 wheels) Gait Pattern/deviations: Step-through pattern, Decreased step length - left, Decreased dorsiflexion - left, Decreased weight shift to right   Gait velocity interpretation: 1.31 - 2.62 ft/sec, indicative of limited community ambulator   General Gait Details: pt able to maneuver RW, however requires incr time to process cues for which way to turn (related to IV); left foot stepping outside RW when turning to his left; pt parked RW off to the side when approaching sink and then with poor alignment to the sink as washing his hands   Stairs             Wheelchair Mobility     Tilt Bed    Modified Rankin (Stroke Patients Only) Modified Rankin (Stroke Patients Only) Pre-Morbid Rankin Score: Moderately severe disability Modified Rankin: Moderately severe disability     Balance Overall balance assessment: Needs assistance Sitting-balance support: Feet supported, Bilateral upper extremity supported Sitting balance-Leahy Scale: Good Sitting balance - Comments: reaches down to don socks Postural control: Posterior lean Standing balance support: During functional activity, Reliant on assistive device for balance, Bilateral upper extremity supported Standing balance-Leahy Scale: Poor Standing balance comment: reliant on external support                            Cognition Arousal: Alert Behavior During Therapy: WFL for tasks assessed/performed Overall Cognitive Status: Impaired/Different from baseline Area of Impairment: Attention,  Memory, Following commands, Awareness, Safety/judgement, Problem solving, Orientation                 Orientation Level: Situation (disoriented to why he is at the  hospital; unaware he had a stroke) Current Attention Level: Focused Memory: Decreased short-term memory Following Commands: Follows one step commands with increased time Safety/Judgement: Decreased awareness of deficits Awareness: Intellectual Problem Solving: Slow processing, Requires verbal cues, Difficulty sequencing          Exercises      General Comments General comments (skin integrity, edema, etc.): VSS On RA      Pertinent Vitals/Pain Pain Assessment Pain Assessment: No/denies pain    Home Living Family/patient expects to be discharged to:: Private residence Living Arrangements: Non-relatives/Friends Available Help at Discharge: Friend(s);Available PRN/intermittently Type of Home: Apartment Home Access: Stairs to enter Entrance Stairs-Rails: Can reach both Entrance Stairs-Number of Steps: 4   Home Layout: One level Home Equipment: Cane - single point      Prior Function            PT Goals (current goals can now be found in the care plan section) Acute Rehab PT Goals Patient Stated Goal: go home Time For Goal Achievement: 03/05/23 Potential to Achieve Goals: Good Progress towards PT goals: Progressing toward goals    Frequency    Min 1X/week      PT Plan      Co-evaluation PT/OT/SLP Co-Evaluation/Treatment: Yes Reason for Co-Treatment: Necessary to address cognition/behavior during functional activity;Complexity of the patient's impairments (multi-system involvement);To address functional/ADL transfers PT goals addressed during session: Mobility/safety with mobility;Balance;Proper use of DME OT goals addressed during session: ADL's and self-care      AM-PAC PT "6 Clicks" Mobility   Outcome Measure  Help needed turning from your back to your side while in a flat bed without using bedrails?: A Little Help needed moving from lying on your back to sitting on the side of a flat bed without using bedrails?: A Little Help needed moving to and  from a bed to a chair (including a wheelchair)?: Total Help needed standing up from a chair using your arms (e.g., wheelchair or bedside chair)?: Total Help needed to walk in hospital room?: A Little Help needed climbing 3-5 steps with a railing? : Total 6 Click Score: 12    End of Session Equipment Utilized During Treatment: Gait belt Activity Tolerance: Patient tolerated treatment well Patient left: in chair;with call bell/phone within reach;with chair alarm set Nurse Communication: Mobility status PT Visit Diagnosis: Unsteadiness on feet (R26.81);Muscle weakness (generalized) (M62.81);Difficulty in walking, not elsewhere classified (R26.2);Other abnormalities of gait and mobility (R26.89)     Time: 6962-9528 PT Time Calculation (min) (ACUTE ONLY): 19 min  Charges:    $Gait Training: 8-22 mins PT General Charges $$ ACUTE PT VISIT: 1 Visit                      Jerolyn Center, PT Acute Rehabilitation Services  Office [phone]    Zena Amos 02/20/2023, 10:36 AM

## 2023-02-20 NOTE — TOC CAGE-AID Note (Signed)
Transition of Care [facility]) - CAGE-AID Screening   Patient Details  Name: Isaac Reyes MRN: [id number] Date of Birth: 1950/12/19  [facility] of [facility] [place]) CM/SW Contact:    Kermit Balo, RN Phone Number: 02/20/2023, 10:27 AM   Clinical Narrative:  Patient declined inpatient/ outpatient substance abuse resources for cocaine use.   CAGE-AID Screening:    Have You Ever Felt You Ought to Cut Down on Your Drinking or Drug Use?: No Have People Annoyed You By Critizing Your Drinking Or Drug Use?: No Have You Felt Bad Or Guilty About Your Drinking Or Drug Use?: No Have You Ever Had a Drink or Used Drugs First Thing In The Morning to Steady Your Nerves or to Get Rid of a Hangover?: No CAGE-AID Score: 0  Substance Abuse Education Offered: Yes (pt refused)

## 2023-02-20 NOTE — Evaluation (Signed)
Occupational Therapy Evaluation Patient Details Name: Isaac Reyes MRN: [id number] DOB: [birth year] Today's Date: 02/20/2023   History of Present Illness 72yo M who presented to the ED on 10/1 due to episode of unresponsiveness and tremor. 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  EEG WNL. 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 Pt currently with decr cognition and awareness of deficits. Overall needing min-mod A for ADLs, CGA for bed mobility, and mod A +2 for transfers with RW. Pt needing min cues to locate items in room for functional grooming task at sink. Pt  presenting with impairments listed below, will follow acutely. Patient will benefit from intensive inpatient follow up therapy, >3 hours/day to maximize safety/ind with ADLs/functional mobility.        If plan is discharge home, recommend the following: A lot of help with walking and/or transfers;A lot of help with bathing/dressing/bathroom;Assistance with cooking/housework;Direct supervision/assist for medications management;Assist for transportation;Direct supervision/assist for financial management;Help with stairs or ramp for entrance;Supervision due to cognitive status    Functional Status Assessment  Patient has had a recent decline in their functional status and demonstrates the ability to make significant improvements in function in a reasonable and predictable amount of time.  Equipment Recommendations  Other (comment) (defer)    Recommendations for Other Services PT consult;Rehab consult     Precautions / Restrictions Precautions Precautions: Fall;Other (comment) Precaution Comments: neuro recommending normotension not permissive  HTN Restrictions Weight Bearing Restrictions: No      Mobility Bed Mobility Overal bed mobility: Needs Assistance Bed Mobility: Supine to Sit, Sit to Supine     Supine to sit: Contact guard          Transfers Overall transfer level: Needs assistance Equipment used: Rolling walker (2 wheels) Transfers: Sit to/from Stand Sit to Stand: Mod assist, +2 physical assistance                  Balance Overall balance assessment: Needs assistance Sitting-balance support: Feet supported, Bilateral upper extremity supported Sitting balance-Leahy Scale: Good Sitting balance - Comments: reaches down to don socks Postural control: Posterior lean Standing balance support: During functional activity, Reliant on assistive device for balance, Bilateral upper extremity supported Standing balance-Leahy Scale: Poor Standing balance comment: reliant on external support                           ADL either performed or assessed with clinical judgement   ADL Overall ADL's : Needs assistance/impaired Eating/Feeding: Set up;Sitting   Grooming: Minimal assistance;Standing;Wash/dry hands   Upper Body Bathing: Minimal assistance   Lower Body Bathing: Moderate assistance   Upper Body Dressing : Minimal assistance   Lower Body Dressing: Moderate assistance   Toilet Transfer: Moderate assistance;Rolling walker (2 wheels);Minimal assistance   Toileting- Clothing Manipulation and Hygiene: Minimal assistance       Functional mobility during ADLs: Moderate assistance;+2 for safety/equipment;Rolling walker (2 wheels);Minimal assistance       Vision Baseline Vision/History: 1 Wears glasses Additional Comments: cues to locate items on R side, but able to read BP numbers on blood pressure machine, can locate food items on tray     Perception Perception: Not tested  Praxis Praxis: Not tested       Pertinent Vitals/Pain Pain Assessment Pain Assessment: No/denies  pain     Extremity/Trunk Assessment Upper Extremity Assessment Upper Extremity Assessment: Generalized weakness   Lower Extremity Assessment Lower Extremity Assessment: Defer to PT evaluation   Cervical / Trunk Assessment Cervical / Trunk Assessment: Normal   Communication Communication Communication: No apparent difficulties   Cognition Arousal: Alert Behavior During Therapy: WFL for tasks assessed/performed Overall Cognitive Status: Impaired/Different from baseline Area of Impairment: Attention, Memory, Following commands, Awareness, Safety/judgement, Problem solving, Orientation                 Orientation Level: Situation (disoriented to why he is at the hospital; unaware he had a stroke) Current Attention Level: Focused Memory: Decreased short-term memory Following Commands: Follows one step commands with increased time Safety/Judgement: Decreased awareness of deficits Awareness: Intellectual Problem Solving: Slow processing, Requires verbal cues, Difficulty sequencing General Comments: requires cues to locate soap to wash hands at sink     General Comments  VSS On RA    Exercises     Shoulder Instructions      Home Living Family/patient expects to be discharged to:: Private residence Living Arrangements: Non-relatives/Friends Available Help at Discharge: Friend(s);Available PRN/intermittently Type of Home: Apartment Home Access: Stairs to enter Entrance Stairs-Number of Steps: 4 Entrance Stairs-Rails: Can reach both Home Layout: One level     Bathroom Shower/Tub: Chief Strategy Officer: Standard     Home Equipment: Cane - single point          Prior Functioning/Environment Prior Level of Function : Needs assist             Mobility Comments: pt initially reports walking with RW, then later states he does not ADLs Comments: reports ind        OT Problem List: Decreased strength;Decreased range of motion;Decreased activity  tolerance;Impaired balance (sitting and/or standing);Decreased cognition;Decreased safety awareness      OT Treatment/Interventions: Self-care/ADL training;Therapeutic exercise;Energy conservation;DME and/or AE instruction;Therapeutic activities;Patient/family education;Balance training;Cognitive remediation/compensation    OT Goals(Current goals can be found in the care plan section) Acute Rehab OT Goals Patient Stated Goal: to go home OT Goal Formulation: With patient Time For Goal Achievement: 03/06/23 Potential to Achieve Goals: Good ADL Goals Pt Will Perform Lower Body Dressing: with supervision;sit to/from stand Pt Will Perform Tub/Shower Transfer: with supervision;ambulating;Tub transfer;Shower transfer Additional ADL Goal #1: pt will follow 3 step command with min cues in prep for ADLs Additional ADL Goal #2: pt will recieve passing score on pillbox assessment in order to promote ind with med mgmt  OT Frequency: Min 1X/week    Co-evaluation PT/OT/SLP Co-Evaluation/Treatment: Yes Reason for Co-Treatment: Necessary to address cognition/behavior during functional activity;Complexity of the patient's impairments (multi-system involvement);To address functional/ADL transfers   OT goals addressed during session: ADL's and self-care      AM-PAC OT "6 Clicks" Daily Activity     Outcome Measure Help from another person eating meals?: A Little Help from another person taking care of personal grooming?: A Little Help from another person toileting, which includes using toliet, bedpan, or urinal?: A Little Help from another person bathing (including washing, rinsing, drying)?: A Lot Help from another person to put on and taking off regular upper body clothing?: A Little Help from another person to put on and taking off regular lower body clothing?: A Lot 6 Click Score: 16   End of Session Equipment Utilized During Treatment: Gait belt;Rolling walker (2  wheels) Nurse Communication:  Mobility status  Activity Tolerance: Patient tolerated treatment well Patient left: in chair;with call bell/phone within reach;with chair alarm set (posey belt chair alarm -- no regular chair alarms on unit)  OT Visit Diagnosis: Unsteadiness on feet (R26.81);Other abnormalities of gait and mobility (R26.89);Muscle weakness (generalized) (M62.81);Other symptoms and signs involving cognitive function                Time: 4098-1191 OT Time Calculation (min): 23 min Charges:  OT General Charges $OT Visit: 1 Visit OT Evaluation $OT Eval Moderate Complexity: 1 Mod  Kaulder Zahner K, OTD, OTR/L [employer] Preferred Acute Rehab [phone]   Carver Fila Koonce 02/20/2023, 9:57 AM

## 2023-02-20 NOTE — Progress Notes (Signed)
STROKE TEAM PROGRESS NOTE   BRIEF HPI [name] is a 72 y.o. male with history of prior strokes, hyperlipidemia, hypertension, and SVT, syncope and cocaine use presenting with an episode of unresponsiveness with some tremoring.  EEG was negative for seizure activity, but brain MRI revealed small acute infarct in the left corona radiata.  This is likely an incidental stroke, but given patient's uncontrolled risk factors, stroke workup would be worthwhile.  Of note, patient denies cocaine use but admission urine toxicology was positive for cocaine.   SIGNIFICANT HOSPITAL EVENTS 9/30 patient admitted and found to have left corona radiata stroke  INTERIM HISTORY/SUBJECTIVE No family at bedside.  Patient lying in bed, more awake alert than yesterday, able to tell me his age, orientated to place but not to time.  Initially refused carotid Doppler but after further discussion, he agreed to proceed.  MRA unremarkable except mild fusiform dilatation of left internal carotid artery at higher cervical level.   OBJECTIVE  CBC    Component Value Date/Time   WBC 7.0 02/19/2023 2141   RBC 5.54 02/19/2023 2141   HGB 14.3 02/19/2023 2141   HCT 45.2 02/19/2023 2141   PLT 113 (L) 02/19/2023 2141   MCV 81.6 02/19/2023 2141   MCH 25.8 (L) 02/19/2023 2141   MCHC 31.6 02/19/2023 2141   RDW 16.4 (H) 02/19/2023 2141   LYMPHSABS 0.6 (L) 02/18/2023 1750   MONOABS 0.4 02/18/2023 1750   EOSABS 0.0 02/18/2023 1750   BASOSABS 0.0 02/18/2023 1750    BMET    Component Value Date/Time   NA 140 02/20/2023 0512   K 3.5 02/20/2023 0512   CL 109 02/20/2023 0512   CO2 20 (L) 02/20/2023 0512   GLUCOSE 106 (H) 02/20/2023 0512   BUN 13 02/20/2023 0512   CREATININE 1.22 02/20/2023 0512   CALCIUM 8.4 (L) 02/20/2023 0512   GFRNONAA >60 02/20/2023 0512    IMAGING past 24 hours VAS US CAROTID  Result Date: 02/20/2023 Carotid Arterial Duplex Study Patient Name:  Isaac Reyes  Date of Exam:    02/20/2023 Medical Rec #: [id number]          Accession #:    [id number] Date of Birth: 09-Nov-1950          Patient Gender: M Patient Age:   77 years Exam Location:  [facility] Procedure:      VAS US CAROTID Referring Phys: Dewitt Hoes DE LA TORRE --------------------------------------------------------------------------------  Indications:       CVA. Risk Factors:      Hyperlipidemia, prior CVA. Comparison Study:  No significant changes seen since previous exam 11/08/17 Performing Technologist: Shona Simpson  Examination Guidelines: A complete evaluation includes B-mode imaging, spectral Doppler, color Doppler, and power Doppler as needed of all accessible portions of each vessel. Bilateral testing is considered an integral part of a complete examination. Limited examinations for reoccurring indications may be performed as noted.  Right Carotid Findings: +----------+--------+--------+--------+------------------+------------------+           PSV cm/sEDV cm/sStenosisPlaque DescriptionComments           +----------+--------+--------+--------+------------------+------------------+ CCA Prox  64      9                                 intimal thickening +----------+--------+--------+--------+------------------+------------------+ CCA Mid   66      14                                                   +----------+--------+--------+--------+------------------+------------------+  CCA Distal50      8                                 intimal thickening +----------+--------+--------+--------+------------------+------------------+ ICA Prox  86      9       1-39%   heterogenous                         +----------+--------+--------+--------+------------------+------------------+ ICA Mid   65      15                                                   +----------+--------+--------+--------+------------------+------------------+ ICA Distal73      18                                                    +----------+--------+--------+--------+------------------+------------------+ ECA       89      11                                                   +----------+--------+--------+--------+------------------+------------------+ +----------+--------+-------+--------+-------------------+           PSV cm/sEDV cmsDescribeArm Pressure (mmHG) +----------+--------+-------+--------+-------------------+ Subclavian104     0                                  +----------+--------+-------+--------+-------------------+ +---------+--------+--+--------+-+ VertebralPSV cm/s64EDV cm/s8 +---------+--------+--+--------+-+  Left Carotid Findings: +----------+--------+--------+--------+------------------+------------------+           PSV cm/sEDV cm/sStenosisPlaque DescriptionComments           +----------+--------+--------+--------+------------------+------------------+ CCA Prox  100     13                                                   +----------+--------+--------+--------+------------------+------------------+ CCA Distal55      9                                 intimal thickening +----------+--------+--------+--------+------------------+------------------+ ICA Prox  41      9       1-39%   heterogenous                         +----------+--------+--------+--------+------------------+------------------+ ICA Mid   86      20                                                   +----------+--------+--------+--------+------------------+------------------+ ICA Distal49      11                                                   +----------+--------+--------+--------+------------------+------------------+  ECA       54      9                                                    +----------+--------+--------+--------+------------------+------------------+ +----------+--------+--------+--------+-------------------+           PSV cm/sEDV cm/sDescribeArm  Pressure (mmHG) +----------+--------+--------+--------+-------------------+ [id number]      0                                   +----------+--------+--------+--------+-------------------+ +---------+--------+--+--------+-+ VertebralPSV cm/s50EDV cm/s9 +---------+--------+--+--------+-+   Summary: Right Carotid: Velocities in the right ICA are consistent with a 1-39% stenosis. Left Carotid: Velocities in the left ICA are consistent with a 1-39% stenosis. Vertebrals:  Bilateral vertebral arteries demonstrate antegrade flow. Subclavians: Normal flow hemodynamics were seen in bilateral subclavian              arteries. *See table(s) above for measurements and observations.     Preliminary     Vitals:   02/20/23 0836 02/20/23 1137 02/20/23 1534 02/20/23 1954  BP: (!) 158/97 (!) 156/82 (!) 152/81 (!) 156/85  Pulse: 72 71 77 77  Resp: 18 18 18 20   Temp: 98.1 F (36.7 C) 98 F (36.7 C) 98.6 F (37 C) 100 F (37.8 C)  TempSrc: Oral Axillary Oral Oral  SpO2: 100% 97% 98% 100%  Weight:      Height:         PHYSICAL EXAM General:  Alert, well-nourished, well-developed patient in no acute distress Psych:  Mood and affect appropriate for situation CV: Regular rate and rhythm on monitor Respiratory:  Regular, unlabored respirations on room air   NEURO:  awake alert, orientated to age and place people, however not orientated to time. Paucity of speech, however no aphasia, mild dysarthria, follows some commands. Cranial nerves intact, moving all extremities, sensation symmetrical, no ataxia.   ASSESSMENT/PLAN  Acute Ischemic Infarct:  left corona radiata punctate infarct, etiology: Likely small vessel disease in the setting of cocaine use and uncontrolled risk factors CT head No acute abnormality. Small vessel disease. Atrophy.  MRI punctate infarct in the left corona radiata, multiple old infarcts of deep white matter and deep gray nuclei, numerous chronic microhemorrhages in a  predominantly central distribution consistent with chronic hypertensive angiopathy MRA Unchanged mild fusiform dilation of the high cervical left internal carotid artery measuring up to 6 mm. Carotid Doppler unremarkable 2D Echo EF 60 to 65%  LDL 118 HgbA1c 5.8 UDS positive for cocaine VTE prophylaxis - Lovenox aspirin 81 mg daily prior to admission, now on aspirin 81 mg daily and clopidogrel 75 mg daily for 3 weeks and then Plavix alone. Therapy recommendations:  CIR Disposition: Pending  Hx of Stroke/TIA Patient has a history of right basal ganglia infarct in March 2019 due to small vessel disease.  EF 55 to 60%.  LDL 124, A1c 5.7, discharged on aspirin and Lipitor left basal ganglia infarct in February 2020.  MRI showed left small caudate tail infarct, and subacute cerebellum small infarct.  CTA head and neck unremarkable LDL 82, A1c 5.6.  Discharged on DAPT and Lipitor 20 Follow-up with Ihor Austin, NP at [facility], residual slurred speech and difficulty walking  Syncope Follow-up with Dr. Antoine Poche cardiology Dr. Antoine Poche consulted 2D echo unremarkable  Dr. Hochreindon't think screening for a cardiac arrhythmia with an implanted monitor would be high yield and would likely be difficult to follow post hospitalization.  No further workup recommended  Hypertension Home meds: Amlodipine 10 mg daily Stable Long-term BP goal normotensive Avoid low BP  Hyperlipidemia Home meds: Atorvastatin 80 mg daily (unsure of compliance), resumed in hospital LDL 118, goal < 70 Continue home Lipitor 80 Continue statin at discharge  Tobacco abuse Current smoker, patient stated that he smokes 1 cigarette/day Smoking cessation counseling provided Pt is willing to quit  Cocaine abuse UDS positive for Cocaine Patient states he does not use cocaine, but he has had multiple positive urine drug screens TOC consult for cessation placed  Other Stroke Risk Factors SVT  Other Active Problems Discoid  lupus  Hospital day # 1  Neurology will sign off. Please call with questions. Pt will follow up with stroke clinic NP Ihor Austin at [facility] in about 4 weeks. Thanks for the consult.   Marvel Plan, MD PhD Stroke Neurology 02/20/2023 8:26 PM    To contact Stroke Continuity provider, please refer to [email or web address]. After hours, contact General Neurology

## 2023-02-20 NOTE — Evaluation (Signed)
Speech Language Pathology Evaluation Patient Details Name: Isaac Reyes MRN: [id number] DOB: 1951/05/06 Today's Date: 02/20/2023 Time: 1350-1409 SLP Time Calculation (min) (ACUTE ONLY): 19 min  Problem List:  Patient Active Problem List   Diagnosis Date Noted   Acute ischemic stroke (HCC) 02/19/2023   History of cocaine abuse (HCC) 02/19/2023   Stroke (HCC) 02/19/2023   Syncope and collapse 07/01/2020   Syncope 11/07/2017   Benign essential HTN 07/26/2017   Acute kidney failure (HCC) 07/24/2017   CVA (cerebral vascular accident) (HCC) 07/19/2017   Stroke (cerebrum) (HCC) 07/18/2017   NSVT (nonsustained ventricular tachycardia) (HCC) 07/18/2017   Elevated blood pressure reading 07/18/2017   Normocytic normochromic anemia 07/18/2017   Malnutrition of moderate degree 07/18/2017   Normocytic anemia    Frequent falls    Renal insufficiency    Hyperlipidemia    Heavy smoker    Alcohol abuse    Abnormal EKG 03/19/2013   Pre-operative cardiovascular exam, new EKG abnormalities c/w ischemia 03/19/2013   LVH (left ventricular hypertrophy) 03/19/2013   Murmur, cardiac 03/19/2013   Past Medical History:  Past Medical History:  Diagnosis Date   Acute CVA (cerebrovascular accident) (HCC) 06/2017   "speech issues since" (07/24/2017)   AKI (acute kidney injury) (HCC) 07/24/2017   Anemia    High cholesterol    Lupus    "skin kind"   NSVT (nonsustained ventricular tachycardia) (HCC)    Substance use    Past Surgical History:  Past Surgical History:  Procedure Laterality Date   KNEE ARTHROSCOPY     TONSILLECTOMY     HPI:  72 yo who presented to the ED on 10/1 due to episode of unresponsiveness and tremor. 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  EEG WNL. PMH CVA 2019 with residual speech issues, AKI, HLD, lupus, NSVT, knee arthroscopy   Assessment / Plan / Recommendation Clinical  Impression  Pt with history of strokes and acute stroke presents with dysarthria that is baseline and pt feels it is possibly slighlty worse. He exhibits decreased cognition in areas of problem solving, awareness (didn't know why his speech was slurred), mental calculations and declarative memory. He scored a 17/30 on the SLUMS. Pt lives with a friend Hydrographic surveyor) and is a retired Chartered certified accountant. Pt states he would like to improve his speech, cognition and would benefit from ST on acute and inpatient rehab > 3 hours therapy a day.    SLP Assessment  SLP Recommendation/Assessment: Patient needs continued Speech Lanaguage Pathology Services SLP Visit Diagnosis: Dysarthria and anarthria (R47.1);Cognitive communication deficit (R41.841)    Recommendations for follow up therapy are one component of a multi-disciplinary discharge planning process, led by the attending physician.  Recommendations may be updated based on patient status, additional functional criteria and insurance authorization.    Follow Up Recommendations  Acute inpatient rehab (3hours/day)    Assistance Recommended at Discharge  Intermittent Supervision/Assistance  Functional Status Assessment Patient has had a recent decline in their functional status and demonstrates the ability to make significant improvements in function in a reasonable and predictable amount of time.  Frequency and Duration min 2x/week  2 weeks      SLP Evaluation Cognition  Overall Cognitive Status: Impaired/Different from baseline Arousal/Alertness: Awake/alert Orientation Level: Disoriented to situation;Oriented to place;Oriented to person (asked why his speech was slurred, oriented to year, and state) Year: 2024 Day of Week: Incorrect Attention: Sustained Sustained Attention:  Appears intact Memory: Impaired Memory Impairment:  (2/5 words) Awareness: Impaired Awareness Impairment: Intellectual impairment;Emergent impairment Problem Solving:  Impaired Problem Solving Impairment: Functional basic Safety/Judgment:  (TBA more in sessions)       Comprehension  Auditory Comprehension Overall Auditory Comprehension: Appears within functional limits for tasks assessed Visual Recognition/Discrimination Discrimination: Not tested Reading Comprehension Reading Status: Not tested    Expression Expression Primary Mode of Expression: Verbal Verbal Expression Overall Verbal Expression: Appears within functional limits for tasks assessed Repetition:  (NT) Naming: Impairment Divergent:  (named 10 animals in one minute) Pragmatics: No impairment Written Expression Dominant Hand: Right Written Expression: Not tested   Oral / Motor  Oral Motor/Sensory Function Overall Oral Motor/Sensory Function: Within functional limits Motor Speech Overall Motor Speech: Impaired Respiration: Within functional limits Phonation: Normal Resonance: Within functional limits Articulation: Impaired Level of Impairment: Phrase Intelligibility: Intelligibility reduced Word: 75-100% accurate Phrase: 75-100% accurate Sentence: 75-100% accurate Conversation: 75-100% accurate Motor Planning: Witnin functional limits Motor Speech Errors: Not applicable            Royce Macadamia 02/20/2023, 2:43 PM

## 2023-02-20 NOTE — Progress Notes (Signed)
Progress Note  Patient Name: Isaac Reyes Date of Encounter: 02/20/2023  Primary Cardiologist:   Rollene Rotunda, MD   Subjective   Denies chest pain or SOB.   Noted low grade temp last night.    Inpatient Medications    Scheduled Meds:   stroke: early stages of recovery book   Does not apply Once   amLODipine  10 mg Oral q morning   aspirin EC  81 mg Oral q morning   atorvastatin  80 mg Oral Daily   clopidogrel  75 mg Oral Daily   enoxaparin (LOVENOX) injection  40 mg Subcutaneous Q24H   ezetimibe  10 mg Oral Daily   potassium chloride  40 mEq Oral BID   Continuous Infusions:  sodium chloride 125 mL/hr at 02/20/23 0012   PRN Meds: acetaminophen **OR** acetaminophen (TYLENOL) oral liquid 160 mg/5 mL **OR** acetaminophen   Vital Signs    Vitals:   02/19/23 1944 02/19/23 2000 02/19/23 2322 02/20/23 0321  BP: (!) 171/100 (!) 152/86 (!) 153/88 (!) 157/112  Pulse: (!) 108 93 79 92  Resp: 18  18 18   Temp: (!) 100.5 F (38.1 C) (!) 100.4 F (38 C) 100.1 F (37.8 C) (!) 100.7 F (38.2 C)  TempSrc: Oral Oral Oral Oral  SpO2: 98% 100% 98% 100%  Weight:      Height:        Intake/Output Summary (Last 24 hours) at 02/20/2023 0838 Last data filed at 02/20/2023 0343 Gross per 24 hour  Intake 214.74 ml  Output 1450 ml  Net -1235.26 ml   Filed Weights   02/19/23 0100  Weight: 88.5 kg    Telemetry    NSR, rare ectopy - Personally Reviewed  ECG    NSR, rate 78, LVH with repolarization changes, T wave inversion noted on distant EKGs likely represent repolarization changes, QT has shortened.  - Personally Reviewed  Physical Exam   GEN: No acute distress.   Neck: No  JVD Cardiac: RRR, no murmurs, rubs, or gallops.  Respiratory: Clear  to auscultation bilaterally. GI: Soft, nontender, non-distended  MS: No  edema; No deformity. Neuro:  Nonfocal  Psych: Normal affect   Labs    Chemistry Recent Labs  Lab 02/18/23 1750 02/20/23 0512  NA 137 140  K  3.2* 3.5  CL 104 109  CO2 22 20*  GLUCOSE 160* 106*  BUN 17 13  CREATININE 1.43* 1.22  CALCIUM 8.1* 8.4*  PROT 7.0  --   ALBUMIN 3.2*  --   AST 24  --   ALT 26  --   ALKPHOS 100  --   BILITOT 0.7  --   GFRNONAA 52* >60  ANIONGAP 11 11     Hematology Recent Labs  Lab 02/18/23 1750 02/19/23 2141  WBC 5.7 7.0  RBC 5.10 5.54  HGB 13.2 14.3  HCT 43.4 45.2  MCV 85.1 81.6  MCH 25.9* 25.8*  MCHC 30.4 31.6  RDW 16.4* 16.4*  PLT 126* 113*    Cardiac EnzymesNo results for input(s): "TROPONINI" in the last 168 hours. No results for input(s): "TROPIPOC" in the last 168 hours.   BNPNo results for input(s): "BNP", "PROBNP" in the last 168 hours.   DDimer No results for input(s): "DDIMER" in the last 168 hours.   Radiology    MR ANGIO HEAD WO CONTRAST  Result Date: 02/19/2023 CLINICAL DATA:  Seizures EXAM: MRA HEAD WITHOUT CONTRAST TECHNIQUE: Angiographic images of the Circle of Willis were acquired using  MRA technique without intravenous contrast. 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 The intracranial ICAs are patent, without significant stenosis or occlusion. The bilateral MCAs and ACAS are patent, without proximal stenosis or occlusion. The anterior communicating artery is not definitely seen. There is no aneurysm or AVM. Posterior circulation: The bilateral V4 segments are patent. The basilar artery is patent. The major cerebellar arteries appear patent. The bilateral PCAs are patent, without proximal stenosis or occlusion. The PCAs are primarily supplied by prominent posterior communicating arteries bilaterally (fetal origins). There is no aneurysm or AVM. Anatomic variants: As above. Other: None. IMPRESSION: 1. 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 2. Otherwise unremarkable  intracranial MRA. Electronically Signed   By: Lesia Hausen M.D.   On: 02/19/2023 18:17   ECHOCARDIOGRAM COMPLETE  Result Date: 02/19/2023    ECHOCARDIOGRAM REPORT   Patient Name:   Isaac Reyes Date of Exam: 02/19/2023 Medical Rec #:  [id number]         Height:       74.0 in Accession #:    [id number]        Weight:       195.1 lb Date of Birth:  05/10/1951         BSA:          2.151 m Patient Age:    72 years          BP:           156/86 mmHg Patient Gender: M                 HR:           83 bpm. Exam Location:  Inpatient Procedure: 2D Echo, Cardiac Doppler and Color Doppler Indications:    stroke  History:        Patient has prior history of Echocardiogram examinations, most                 recent 07/11/2021. Stroke; Risk Factors:Hypertension. Alcohol                 abuse. Cocaine abuse.  Sonographer:    Melissa Morford RDCS (AE, PE) Referring Phys: [id number] JARED M GARDNER IMPRESSIONS  1. Left ventricular ejection fraction, by estimation, is 60 to 65%. The left ventricle has normal function. The left ventricle has no regional wall motion abnormalities. There is mild concentric left ventricular hypertrophy. Left ventricular diastolic parameters are consistent with Grade I diastolic dysfunction (impaired relaxation).  2. Right ventricular systolic function is normal. The right ventricular size is normal.  3. Left atrial size was mild to moderately dilated.  4. The mitral valve is normal in structure. Trivial mitral valve regurgitation. No evidence of mitral stenosis.  5. The aortic valve is normal in structure. Aortic valve regurgitation is trivial. No aortic stenosis is present.  6. The inferior vena cava is dilated in size with <50% respiratory variability, suggesting right atrial pressure of 15 mmHg. FINDINGS  Left Ventricle: Left ventricular ejection fraction, by estimation, is 60 to 65%. The left ventricle has normal function. The left ventricle has no regional wall motion abnormalities. The left  ventricular internal cavity size was normal in size. There is  mild concentric left ventricular hypertrophy. Left ventricular diastolic parameters are consistent with Grade I diastolic dysfunction (impaired relaxation).  Right Ventricle: The right ventricular size is normal. No increase in right ventricular wall thickness. Right ventricular systolic function is normal. Left Atrium: Left atrial size was mild to moderately dilated. Right Atrium: Right atrial size was normal in size. Pericardium: There is no evidence of pericardial effusion. Mitral Valve: The mitral valve is normal in structure. There is mild calcification of the mitral valve leaflet(s). Trivial mitral valve regurgitation. No evidence of mitral valve stenosis. Tricuspid Valve: The tricuspid valve is normal in structure. Tricuspid valve regurgitation is not demonstrated. No evidence of tricuspid stenosis. Aortic Valve: The aortic valve is normal in structure. Aortic valve regurgitation is trivial. No aortic stenosis is present. Pulmonic Valve: The pulmonic valve was normal in structure. Pulmonic valve regurgitation is not visualized. No evidence of pulmonic stenosis. Aorta: The aortic root is normal in size and structure. Venous: The inferior vena cava is dilated in size with less than 50% respiratory variability, suggesting right atrial pressure of 15 mmHg. IAS/Shunts: No atrial level shunt detected by color flow Doppler.  LEFT VENTRICLE PLAX 2D LVIDd:         4.20 cm      Diastology LVIDs:         3.80 cm      LV e' medial:    7.40 cm/s LV PW:         1.00 cm      LV E/e' medial:  7.7 LV IVS:        1.10 cm      LV e' lateral:   8.59 cm/s LVOT diam:     1.70 cm      LV E/e' lateral: 6.6 LV SV:         40 LV SV Index:   19 LVOT Area:     2.27 cm  LV Volumes (MOD) LV vol d, MOD A2C: 135.0 ml LV vol d, MOD A4C: 114.0 ml LV vol s, MOD A2C: 90.4 ml LV vol s, MOD A4C: 60.1 ml LV SV MOD A2C:     44.6 ml LV SV MOD A4C:     114.0 ml LV SV MOD BP:      46.9 ml  RIGHT VENTRICLE RV S prime:     10.90 cm/s TAPSE (M-mode): 1.5 cm LEFT ATRIUM           Index        RIGHT ATRIUM           Index LA diam:      4.30 cm 2.00 cm/m   RA Area:     16.10 cm LA Vol (A2C): 84.2 ml 39.15 ml/m  RA Volume:   37.60 ml  17.48 ml/m LA Vol (A4C): 65.8 ml 30.59 ml/m  AORTIC VALVE LVOT Vmax:   87.20 cm/s LVOT Vmean:  67.200 cm/s LVOT VTI:    0.177 m  AORTA Ao Root diam: 3.60 cm MITRAL VALVE MV Area (PHT): 2.92 cm     SHUNTS MV Decel Time: 260 msec     Systemic VTI:  0.18 m MV E velocity: 56.70 cm/s   Systemic Diam: 1.70 cm MV A velocity: 126.00 cm/s MV E/A ratio:  0.45 Arvilla Meres MD Electronically signed by Arvilla Meres MD Signature Date/Time: 02/19/2023/8:53:48 AM    Final    EEG adult  Result Date: 02/19/2023 Charlsie Quest, MD     02/19/2023  8:28 AM Patient Name: Isaac Reyes MRN: [id number] Epilepsy Attending: Charlsie Quest Referring Physician/Provider: Hillary Bow, DO Date: 02/19/2023  Duration: 22.42 mins Patient history: 73 year old man with past history of prior strokes with residual speech deficits and walking difficulty, hypercholesterolemia, hypertension, NSVT, prior history of syncope when noncompliant to medications presenting for evaluation of an episode of unresponsiveness with some tremoring. EEG to evaluate for seizure Level of alertness: Awake, AEDs during EEG study: None Technical aspects: This EEG study was done with scalp electrodes positioned according to the 10-20 International system of electrode placement. Electrical activity was reviewed with band pass filter of 1-70Hz , sensitivity of 7 uV/mm, display speed of 44mm/sec with a 60Hz  notched filter applied as appropriate. EEG data were recorded continuously and digitally stored.  Video monitoring was available and reviewed as appropriate. Description: The posterior dominant rhythm consists of 8 Hz activity of moderate voltage (25-35 uV) seen predominantly in posterior head regions, symmetric  and reactive to eye opening and eye closing. Hyperventilation and photic stimulation were not performed.   IMPRESSION: This study is within normal limits. No seizures or epileptiform discharges were seen throughout the recording. A normal interictal EEG does not exclude the diagnosis of epilepsy. Charlsie Quest   MR BRAIN WO CONTRAST  Result Date: 02/18/2023 CLINICAL DATA:  Dysarthria EXAM: MRI HEAD WITHOUT CONTRAST TECHNIQUE: Multiplanar, multiecho pulse sequences of the brain and surrounding structures were obtained without intravenous contrast. COMPARISON:  07/04/2020 FINDINGS: Brain: There is a punctate focus of abnormal diffusion restriction within the left corona radiata. There are numerous chronic microhemorrhages in a predominantly central distribution. There is confluent hyperintense T2-weighted signal within the periventricular and deep white matter. Mild volume loss. Multiple old infarcts of the deep white matter and deep gray nuclei. No hydrocephalus. The major midline structures are normal. Vascular: Normal flow voids. Skull and upper cervical spine: Normal bone marrow signal. Sinuses/Orbits: Paranasal sinuses are clear. No mastoid effusion. Normal orbits. Other: None IMPRESSION: 1. Punctate focus of acute ischemia within the left corona radiata. No hemorrhage or mass effect. 2. Multiple old infarcts of the deep white matter and deep gray nuclei. 3. Numerous chronic microhemorrhages in a predominantly central distribution, consistent with chronic hypertensive angiopathy. Electronically Signed   By: Deatra Robinson M.D.   On: 02/18/2023 23:18   CT HEAD WO CONTRAST  Result Date: 02/18/2023 CLINICAL DATA:  Altered mental status EXAM: CT HEAD WITHOUT CONTRAST TECHNIQUE: Contiguous axial images were obtained from the base of the skull through the vertex without intravenous contrast. RADIATION DOSE REDUCTION: This exam was performed according to the departmental dose-optimization program which  includes automated exposure control, adjustment of the mA and/or kV according to patient size and/or use of iterative reconstruction technique. COMPARISON:  11/15/2021 FINDINGS: Brain: No evidence of acute infarction, hemorrhage, hydrocephalus, extra-axial collection or mass lesion/mass effect. Chronic atrophic and ischemic changes are identified. Scattered lacunar infarcts are noted within the thalami and basal ganglia bilaterally. Vascular: No hyperdense vessel or unexpected calcification. Skull: Normal. Negative for fracture or focal lesion. Sinuses/Orbits: No acute finding. Other: None. IMPRESSION: Chronic atrophic and ischemic changes without acute abnormality. Electronically Signed   By: Alcide Clever M.D.   On: 02/18/2023 20:58   DG Chest Port 1 View  Result Date: 02/18/2023 CLINICAL DATA:  Altered level of consciousness EXAM: PORTABLE CHEST 1 VIEW COMPARISON:  11/01/2021 FINDINGS: Cardiac shadow is stable. Elevation of left hemidiaphragm is noted. No free air is seen. No focal infiltrate or effusion is noted. No bony abnormality noted. IMPRESSION: No acute abnormality noted. Electronically Signed   By: Alcide Clever M.D.   On: 02/18/2023 20:51  Cardiac Studies   Echo:  See above.   Patient Profile     72 y.o. male with a hx of syncope who is being seen for the evaluation of syncope at the request of Dr. Roda Shutters.   Assessment & Plan    LOC:  Events not clear.  No compelling evidence of embolic event.  Echo OK as above.  No arrhythmias on overnight telemetry.  QT has normalized.  Potassium supplemented.  Magnesium level is pending.   Unremarkable intracranial MRA.  Carotid Doppler ordered.  No further cardiac work up.    For questions or updates, please contact [facility] Please consult [email or web address] for contact info under Cardiology/STEMI.   Signed, Rollene Rotunda, MD  02/20/2023, 8:38 AM  \

## 2023-02-20 NOTE — Progress Notes (Signed)
Inpatient Rehab Coordinator Note:  I met with patient at bedside to discuss CIR recommendations and goals/expectations of CIR stay.  We reviewed 3 hrs/day of therapy, physician follow up, and average length of stay 2 weeks (dependent upon progress) with goals of supervision.  Pt lives with his friend, Aggie Cosier, who works for a [employer].  We spoke on speaker phone with Crystal and she confirms pt will have 24/7 supervision at discharge.  I reviewed need for prior auth and I will start that today.  Will follow.   Estill Dooms, PT, DPT Admissions Coordinator [phone] 02/20/23  1:01 PM

## 2023-02-21 DIAGNOSIS — I639 Cerebral infarction, unspecified: Secondary | ICD-10-CM | POA: Diagnosis not present

## 2023-02-21 NOTE — Progress Notes (Signed)
Physical Therapy Treatment Patient Details Name: Isaac Reyes MRN: [id number] DOB: [birth year] Today's Date: 02/21/2023   History of Present Illness 72yo M who presented to the ED on 10/1 due to episode of unresponsiveness and tremor. 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  EEG WNL. 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 He reports feeling fatigued today and wants to go home. Patient required maximal assistance for standing with maximal cues for technique. Steadying assistance required for short distance ambulation using rolling walker with reinforcement of rolling walker positioning. Recommend intensive rehabilitation after this hospital stay >3 hours/day.    If plan is discharge home, recommend the following: Two people to help with walking and/or transfers;Direct supervision/assist for medications management;Direct supervision/assist for financial management;Assist for transportation;Help with stairs or ramp for entrance   Can travel by private vehicle        Equipment Recommendations  Rolling walker (2 wheels);BSC/3in1;Wheelchair (measurements PT);Wheelchair cushion (measurements PT)    Recommendations for Other Services Rehab consult     Precautions / Restrictions Precautions Precautions: Fall;Other (comment) Precaution Comments: neuro recommending normotension not permissive HTN Restrictions Weight Bearing Restrictions: No     Mobility  Bed Mobility Overal bed mobility: Needs Assistance Bed Mobility: Supine to Sit, Sit to Supine     Supine to sit: Contact guard Sit to supine: Contact guard assist   General bed mobility comments: increased time. cues for task initiation    Transfers Overall transfer level: Needs assistance Equipment used: Rolling walker (2 wheels) Transfers: Sit  to/from Stand Sit to Stand: Max assist           General transfer comment: multiple attempts made for sit to stand unsuccessfully. with cues for technique, patient had increased independence with pushing on the bed rail with RUE for support. significant lifting assistance required with posterior lean throughout despite cues    Ambulation/Gait Ambulation/Gait assistance: Min assist Gait Distance (Feet): 24 Feet Assistive device: Rolling walker (2 wheels) Gait Pattern/deviations: Step-through pattern, Decreased step length - left, Decreased dorsiflexion - left, Decreased weight shift to right Gait velocity: decreased     General Gait Details: patient ambulated in the room with steadying assistance required. reinforcement of rolling walker positioning   Stairs             Wheelchair Mobility     Tilt Bed    Modified Rankin (Stroke Patients Only)       Balance Overall balance assessment: Needs assistance Sitting-balance support: Feet supported, Bilateral upper extremity supported Sitting balance-Leahy Scale: Good   Postural control: Posterior lean Standing balance support: During functional activity, Reliant on assistive device for balance, Bilateral upper extremity supported Standing balance-Leahy Scale: Poor Standing balance comment: reliant on external support from therapist and rolling walker.                            Cognition Arousal: Alert Behavior During Therapy: Impulsive, WFL for tasks assessed/performed Overall Cognitive Status: Impaired/Different from baseline Area of Impairment: Attention, Memory, Following commands, Awareness, Safety/judgement, Problem solving, Orientation                 Orientation Level: Disoriented to, Situation Current Attention Level: Focused Memory: Decreased short-term memory Following Commands: Follows one step commands with increased time Safety/Judgement: Decreased  awareness of deficits Awareness:  Intellectual Problem Solving: Slow processing, Requires verbal cues, Difficulty sequencing General Comments: prefers to answer questions with yes or no        Exercises      General Comments        Pertinent Vitals/Pain Pain Assessment Pain Assessment: No/denies pain    Home Living                          Prior Function            PT Goals (current goals can now be found in the care plan section) Acute Rehab PT Goals Patient Stated Goal: go home PT Goal Formulation: With patient Time For Goal Achievement: 03/05/23 Potential to Achieve Goals: Good Progress towards PT goals: Progressing toward goals    Frequency    Min 1X/week      PT Plan      Co-evaluation              AM-PAC PT "6 Clicks" Mobility   Outcome Measure  Help needed turning from your back to your side while in a flat bed without using bedrails?: A Little Help needed moving from lying on your back to sitting on the side of a flat bed without using bedrails?: A Little Help needed moving to and from a bed to a chair (including a wheelchair)?: Total Help needed standing up from a chair using your arms (e.g., wheelchair or bedside chair)?: Total Help needed to walk in hospital room?: A Little Help needed climbing 3-5 steps with a railing? : Total 6 Click Score: 12    End of Session Equipment Utilized During Treatment: Gait belt Activity Tolerance: Patient tolerated treatment well Patient left: in bed;with call bell/phone within reach;with bed alarm set Nurse Communication: Mobility status PT Visit Diagnosis: Unsteadiness on feet (R26.81);Muscle weakness (generalized) (M62.81);Difficulty in walking, not elsewhere classified (R26.2);Other abnormalities of gait and mobility (R26.89)     Time: 1450-1509 PT Time Calculation (min) (ACUTE ONLY): 19 min  Charges:    $Therapeutic Activity: 8-22 mins PT General Charges $$ ACUTE PT VISIT: 1 Visit                     Donna Bernard, PT, MPT    [name] 02/21/2023, 3:17 PM

## 2023-02-21 NOTE — Plan of Care (Signed)
  Problem: Education: Goal: Knowledge of disease or condition will improve Outcome: Progressing   Problem: Ischemic Stroke/TIA Tissue Perfusion: Goal: Complications of ischemic stroke/TIA will be minimized Outcome: Progressing   Problem: Coping: Goal: Will verbalize positive feelings about self Outcome: Progressing Goal: Will identify appropriate support needs Outcome: Progressing   Problem: Self-Care: Goal: Ability to communicate needs accurately will improve Outcome: Progressing   Problem: Nutrition: Goal: Risk of aspiration will decrease Outcome: Progressing Goal: Dietary intake will improve Outcome: Progressing

## 2023-02-21 NOTE — Progress Notes (Signed)
Inpatient Rehab Admissions Coordinator:   Awaiting insurance auth.  I will not have a bed for this patient to admit to CIR today.   Estill Dooms, PT, DPT Admissions Coordinator [phone] 02/21/23  10:31 AM

## 2023-02-21 NOTE — Progress Notes (Signed)
Inpatient Rehab Admissions Coordinator:   I received approval for CIR admission but I do not have a bed available for this patient to admit to CIR today.  Will follow for admit pending bed availability, hopefully in the next 1-2 days.  Estill Dooms, PT, DPT Admissions Coordinator [phone] 02/21/23  3:17 PM

## 2023-02-21 NOTE — Progress Notes (Signed)
    [facility] will sign off.   Medication Recommendations:  As on [facility] Other recommendations (labs, testing, etc):  NA Follow up as an outpatient:  With neurology and primary provider

## 2023-02-21 NOTE — Plan of Care (Signed)
  Problem: Education: Goal: Knowledge of disease or condition will improve Outcome: Progressing   Problem: Ischemic Stroke/TIA Tissue Perfusion: Goal: Complications of ischemic stroke/TIA will be minimized Outcome: Progressing   Problem: Coping: Goal: Will verbalize positive feelings about self Outcome: Progressing   Problem: Health Behavior/Discharge Planning: Goal: Goals will be collaboratively established with patient/family Outcome: Progressing   Problem: Education: Goal: Knowledge of General Education information will improve Description: Including pain rating scale, medication(s)/side effects and non-pharmacologic comfort measures Outcome: Progressing   Problem: Clinical Measurements: Goal: Will remain free from infection Outcome: Progressing Goal: Respiratory complications will improve Outcome: Progressing Goal: Cardiovascular complication will be avoided Outcome: Progressing   Problem: Self-Care: Goal: Ability to communicate needs accurately will improve Outcome: Not Progressing

## 2023-02-21 NOTE — Progress Notes (Signed)
2141   PLT 113 (L) 02/19/2023 2141   MCV 81.6 02/19/2023 2141   MCH 25.8 (L) 02/19/2023 2141   MCHC 31.6 02/19/2023 2141   RDW 16.4 (H) 02/19/2023 2141   LYMPHSABS 0.6 (L) 02/18/2023 1750   MONOABS 0.4 02/18/2023 1750   EOSABS 0.0 02/18/2023 1750   BASOSABS 0.0 02/18/2023 1750      Latest Ref Rng & Units 02/20/2023    5:12 AM 02/18/2023    5:50 PM 11/15/2021    6:41 PM  BMP  Glucose 70 - 99 mg/dL 161  096  045   BUN 8 - 23 mg/dL 13  17  25    Creatinine 0.61 - 1.24 mg/dL 4.09  8.11  9.14   Sodium 135 - 145 mmol/L 140  137   141   Potassium 3.5 - 5.1 mmol/L 3.5  3.2  3.6   Chloride 98 - 111 mmol/L 109  104  107   CO2 22 - 32 mmol/L 20  22  25    Calcium 8.9 - 10.3 mg/dL 8.4  8.1  9.0     VAS US CAROTID  Result Date: 02/20/2023 Carotid Arterial Duplex Study Patient Name:  Isaac Reyes  Date of Exam:   02/20/2023 Medical Rec #: [id number]          Accession #:    [id number] Date of Birth: 07-14-1950          Patient Gender: M Patient Age:   72 years Exam Location:  [facility] Procedure:      VAS US CAROTID Referring Phys: Dewitt Hoes DE LA TORRE --------------------------------------------------------------------------------  Indications:       CVA. Risk Factors:      Hyperlipidemia, prior CVA. Comparison Study:  No significant changes seen since previous exam 11/08/17 Performing Technologist: Shona Simpson  Examination Guidelines: A complete evaluation includes B-mode imaging, spectral Doppler, color Doppler, and power Doppler as needed of all accessible portions of each vessel. Bilateral testing is considered an integral part of a complete examination. Limited examinations for reoccurring indications may be performed as noted.  Right Carotid Findings: +----------+--------+--------+--------+------------------+------------------+           PSV cm/sEDV cm/sStenosisPlaque DescriptionComments           +----------+--------+--------+--------+------------------+------------------+ CCA Prox  64      9                                 intimal thickening +----------+--------+--------+--------+------------------+------------------+ CCA Mid   66      14                                                   +----------+--------+--------+--------+------------------+------------------+ CCA Distal50      8                                 intimal thickening +----------+--------+--------+--------+------------------+------------------+ ICA Prox  86      9       1-39%   heterogenous                          +----------+--------+--------+--------+------------------+------------------+ ICA Mid   65      15                                                   +----------+--------+--------+--------+------------------+------------------+  VertebralPSV cm/s50EDV cm/s9 +---------+--------+--+--------+-+   Summary: Right Carotid: Velocities in the right ICA are consistent with a 1-39% stenosis. Left Carotid: Velocities in the left ICA are consistent with a 1-39% stenosis. Vertebrals:  Bilateral vertebral arteries demonstrate antegrade flow. Subclavians: Normal flow hemodynamics were seen in bilateral subclavian              arteries. *See table(s) above for measurements and observations.     Preliminary    MR ANGIO HEAD WO CONTRAST  Result Date: 02/19/2023 CLINICAL DATA:  Seizures EXAM: MRA HEAD WITHOUT CONTRAST TECHNIQUE: Angiographic images of the Circle of Willis were acquired using MRA technique without intravenous contrast. 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 The intracranial ICAs are patent, without significant stenosis or occlusion. The bilateral MCAs and ACAS are patent, without proximal stenosis or occlusion. The anterior communicating artery is not definitely seen. There is no aneurysm or AVM. Posterior circulation: The bilateral V4 segments are patent. The basilar artery is patent. The major cerebellar arteries appear patent. The bilateral PCAs are patent, without proximal stenosis or occlusion. The PCAs are primarily supplied  by prominent posterior communicating arteries bilaterally (fetal origins). There is no aneurysm or AVM. Anatomic variants: As above. Other: None. IMPRESSION: 1. 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 2. Otherwise unremarkable intracranial MRA. Electronically Signed   By: Lesia Hausen M.D.   On: 02/19/2023 18:17   ECHOCARDIOGRAM COMPLETE  Result Date: 02/19/2023    ECHOCARDIOGRAM REPORT   Patient Name:   Isaac Reyes Date of Exam: 02/19/2023 Medical Rec #:  [id number]         Height:       74.0 in Accession #:    [id number]        Weight:       195.1 lb Date of Birth:  Oct 16, 1950         BSA:          2.151 m Patient Age:    72 years          BP:           156/86 mmHg Patient Gender: M                 HR:           83 bpm. Exam Location:  Inpatient Procedure: 2D Echo, Cardiac Doppler and Color Doppler Indications:    stroke  History:        Patient has prior history of Echocardiogram examinations, most                 recent 07/11/2021. Stroke; Risk Factors:Hypertension. Alcohol                 abuse. Cocaine abuse.  Sonographer:    Melissa Morford RDCS (AE, PE) Referring Phys: [id number] JARED M GARDNER IMPRESSIONS  1. Left ventricular ejection fraction, by estimation, is 60 to 65%. The left ventricle has normal function. The left ventricle has no regional wall motion abnormalities. There is mild concentric left ventricular hypertrophy. Left ventricular diastolic parameters are consistent with Grade I diastolic dysfunction (impaired relaxation).  2. Right ventricular systolic function is normal. The right ventricular size is normal.  3. Left atrial size was mild to moderately dilated.  4. The mitral valve is normal in structure. Trivial mitral  VertebralPSV cm/s50EDV cm/s9 +---------+--------+--+--------+-+   Summary: Right Carotid: Velocities in the right ICA are consistent with a 1-39% stenosis. Left Carotid: Velocities in the left ICA are consistent with a 1-39% stenosis. Vertebrals:  Bilateral vertebral arteries demonstrate antegrade flow. Subclavians: Normal flow hemodynamics were seen in bilateral subclavian              arteries. *See table(s) above for measurements and observations.     Preliminary    MR ANGIO HEAD WO CONTRAST  Result Date: 02/19/2023 CLINICAL DATA:  Seizures EXAM: MRA HEAD WITHOUT CONTRAST TECHNIQUE: Angiographic images of the Circle of Willis were acquired using MRA technique without intravenous contrast. 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 The intracranial ICAs are patent, without significant stenosis or occlusion. The bilateral MCAs and ACAS are patent, without proximal stenosis or occlusion. The anterior communicating artery is not definitely seen. There is no aneurysm or AVM. Posterior circulation: The bilateral V4 segments are patent. The basilar artery is patent. The major cerebellar arteries appear patent. The bilateral PCAs are patent, without proximal stenosis or occlusion. The PCAs are primarily supplied  by prominent posterior communicating arteries bilaterally (fetal origins). There is no aneurysm or AVM. Anatomic variants: As above. Other: None. IMPRESSION: 1. 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 2. Otherwise unremarkable intracranial MRA. Electronically Signed   By: Lesia Hausen M.D.   On: 02/19/2023 18:17   ECHOCARDIOGRAM COMPLETE  Result Date: 02/19/2023    ECHOCARDIOGRAM REPORT   Patient Name:   Isaac Reyes Date of Exam: 02/19/2023 Medical Rec #:  [id number]         Height:       74.0 in Accession #:    [id number]        Weight:       195.1 lb Date of Birth:  Oct 16, 1950         BSA:          2.151 m Patient Age:    72 years          BP:           156/86 mmHg Patient Gender: M                 HR:           83 bpm. Exam Location:  Inpatient Procedure: 2D Echo, Cardiac Doppler and Color Doppler Indications:    stroke  History:        Patient has prior history of Echocardiogram examinations, most                 recent 07/11/2021. Stroke; Risk Factors:Hypertension. Alcohol                 abuse. Cocaine abuse.  Sonographer:    Melissa Morford RDCS (AE, PE) Referring Phys: [id number] JARED M GARDNER IMPRESSIONS  1. Left ventricular ejection fraction, by estimation, is 60 to 65%. The left ventricle has normal function. The left ventricle has no regional wall motion abnormalities. There is mild concentric left ventricular hypertrophy. Left ventricular diastolic parameters are consistent with Grade I diastolic dysfunction (impaired relaxation).  2. Right ventricular systolic function is normal. The right ventricular size is normal.  3. Left atrial size was mild to moderately dilated.  4. The mitral valve is normal in structure. Trivial mitral  VertebralPSV cm/s50EDV cm/s9 +---------+--------+--+--------+-+   Summary: Right Carotid: Velocities in the right ICA are consistent with a 1-39% stenosis. Left Carotid: Velocities in the left ICA are consistent with a 1-39% stenosis. Vertebrals:  Bilateral vertebral arteries demonstrate antegrade flow. Subclavians: Normal flow hemodynamics were seen in bilateral subclavian              arteries. *See table(s) above for measurements and observations.     Preliminary    MR ANGIO HEAD WO CONTRAST  Result Date: 02/19/2023 CLINICAL DATA:  Seizures EXAM: MRA HEAD WITHOUT CONTRAST TECHNIQUE: Angiographic images of the Circle of Willis were acquired using MRA technique without intravenous contrast. 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 The intracranial ICAs are patent, without significant stenosis or occlusion. The bilateral MCAs and ACAS are patent, without proximal stenosis or occlusion. The anterior communicating artery is not definitely seen. There is no aneurysm or AVM. Posterior circulation: The bilateral V4 segments are patent. The basilar artery is patent. The major cerebellar arteries appear patent. The bilateral PCAs are patent, without proximal stenosis or occlusion. The PCAs are primarily supplied  by prominent posterior communicating arteries bilaterally (fetal origins). There is no aneurysm or AVM. Anatomic variants: As above. Other: None. IMPRESSION: 1. 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 2. Otherwise unremarkable intracranial MRA. Electronically Signed   By: Lesia Hausen M.D.   On: 02/19/2023 18:17   ECHOCARDIOGRAM COMPLETE  Result Date: 02/19/2023    ECHOCARDIOGRAM REPORT   Patient Name:   Isaac Reyes Date of Exam: 02/19/2023 Medical Rec #:  [id number]         Height:       74.0 in Accession #:    [id number]        Weight:       195.1 lb Date of Birth:  Oct 16, 1950         BSA:          2.151 m Patient Age:    72 years          BP:           156/86 mmHg Patient Gender: M                 HR:           83 bpm. Exam Location:  Inpatient Procedure: 2D Echo, Cardiac Doppler and Color Doppler Indications:    stroke  History:        Patient has prior history of Echocardiogram examinations, most                 recent 07/11/2021. Stroke; Risk Factors:Hypertension. Alcohol                 abuse. Cocaine abuse.  Sonographer:    Melissa Morford RDCS (AE, PE) Referring Phys: [id number] JARED M GARDNER IMPRESSIONS  1. Left ventricular ejection fraction, by estimation, is 60 to 65%. The left ventricle has normal function. The left ventricle has no regional wall motion abnormalities. There is mild concentric left ventricular hypertrophy. Left ventricular diastolic parameters are consistent with Grade I diastolic dysfunction (impaired relaxation).  2. Right ventricular systolic function is normal. The right ventricular size is normal.  3. Left atrial size was mild to moderately dilated.  4. The mitral valve is normal in structure. Trivial mitral  VertebralPSV cm/s50EDV cm/s9 +---------+--------+--+--------+-+   Summary: Right Carotid: Velocities in the right ICA are consistent with a 1-39% stenosis. Left Carotid: Velocities in the left ICA are consistent with a 1-39% stenosis. Vertebrals:  Bilateral vertebral arteries demonstrate antegrade flow. Subclavians: Normal flow hemodynamics were seen in bilateral subclavian              arteries. *See table(s) above for measurements and observations.     Preliminary    MR ANGIO HEAD WO CONTRAST  Result Date: 02/19/2023 CLINICAL DATA:  Seizures EXAM: MRA HEAD WITHOUT CONTRAST TECHNIQUE: Angiographic images of the Circle of Willis were acquired using MRA technique without intravenous contrast. 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 The intracranial ICAs are patent, without significant stenosis or occlusion. The bilateral MCAs and ACAS are patent, without proximal stenosis or occlusion. The anterior communicating artery is not definitely seen. There is no aneurysm or AVM. Posterior circulation: The bilateral V4 segments are patent. The basilar artery is patent. The major cerebellar arteries appear patent. The bilateral PCAs are patent, without proximal stenosis or occlusion. The PCAs are primarily supplied  by prominent posterior communicating arteries bilaterally (fetal origins). There is no aneurysm or AVM. Anatomic variants: As above. Other: None. IMPRESSION: 1. 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 2. Otherwise unremarkable intracranial MRA. Electronically Signed   By: Lesia Hausen M.D.   On: 02/19/2023 18:17   ECHOCARDIOGRAM COMPLETE  Result Date: 02/19/2023    ECHOCARDIOGRAM REPORT   Patient Name:   Isaac Reyes Date of Exam: 02/19/2023 Medical Rec #:  [id number]         Height:       74.0 in Accession #:    [id number]        Weight:       195.1 lb Date of Birth:  Oct 16, 1950         BSA:          2.151 m Patient Age:    72 years          BP:           156/86 mmHg Patient Gender: M                 HR:           83 bpm. Exam Location:  Inpatient Procedure: 2D Echo, Cardiac Doppler and Color Doppler Indications:    stroke  History:        Patient has prior history of Echocardiogram examinations, most                 recent 07/11/2021. Stroke; Risk Factors:Hypertension. Alcohol                 abuse. Cocaine abuse.  Sonographer:    Melissa Morford RDCS (AE, PE) Referring Phys: [id number] JARED M GARDNER IMPRESSIONS  1. Left ventricular ejection fraction, by estimation, is 60 to 65%. The left ventricle has normal function. The left ventricle has no regional wall motion abnormalities. There is mild concentric left ventricular hypertrophy. Left ventricular diastolic parameters are consistent with Grade I diastolic dysfunction (impaired relaxation).  2. Right ventricular systolic function is normal. The right ventricular size is normal.  3. Left atrial size was mild to moderately dilated.  4. The mitral valve is normal in structure. Trivial mitral  2141   PLT 113 (L) 02/19/2023 2141   MCV 81.6 02/19/2023 2141   MCH 25.8 (L) 02/19/2023 2141   MCHC 31.6 02/19/2023 2141   RDW 16.4 (H) 02/19/2023 2141   LYMPHSABS 0.6 (L) 02/18/2023 1750   MONOABS 0.4 02/18/2023 1750   EOSABS 0.0 02/18/2023 1750   BASOSABS 0.0 02/18/2023 1750      Latest Ref Rng & Units 02/20/2023    5:12 AM 02/18/2023    5:50 PM 11/15/2021    6:41 PM  BMP  Glucose 70 - 99 mg/dL 161  096  045   BUN 8 - 23 mg/dL 13  17  25    Creatinine 0.61 - 1.24 mg/dL 4.09  8.11  9.14   Sodium 135 - 145 mmol/L 140  137   141   Potassium 3.5 - 5.1 mmol/L 3.5  3.2  3.6   Chloride 98 - 111 mmol/L 109  104  107   CO2 22 - 32 mmol/L 20  22  25    Calcium 8.9 - 10.3 mg/dL 8.4  8.1  9.0     VAS US CAROTID  Result Date: 02/20/2023 Carotid Arterial Duplex Study Patient Name:  Isaac Reyes  Date of Exam:   02/20/2023 Medical Rec #: [id number]          Accession #:    [id number] Date of Birth: 07-14-1950          Patient Gender: M Patient Age:   72 years Exam Location:  [facility] Procedure:      VAS US CAROTID Referring Phys: Dewitt Hoes DE LA TORRE --------------------------------------------------------------------------------  Indications:       CVA. Risk Factors:      Hyperlipidemia, prior CVA. Comparison Study:  No significant changes seen since previous exam 11/08/17 Performing Technologist: Shona Simpson  Examination Guidelines: A complete evaluation includes B-mode imaging, spectral Doppler, color Doppler, and power Doppler as needed of all accessible portions of each vessel. Bilateral testing is considered an integral part of a complete examination. Limited examinations for reoccurring indications may be performed as noted.  Right Carotid Findings: +----------+--------+--------+--------+------------------+------------------+           PSV cm/sEDV cm/sStenosisPlaque DescriptionComments           +----------+--------+--------+--------+------------------+------------------+ CCA Prox  64      9                                 intimal thickening +----------+--------+--------+--------+------------------+------------------+ CCA Mid   66      14                                                   +----------+--------+--------+--------+------------------+------------------+ CCA Distal50      8                                 intimal thickening +----------+--------+--------+--------+------------------+------------------+ ICA Prox  86      9       1-39%   heterogenous                          +----------+--------+--------+--------+------------------+------------------+ ICA Mid   65      15                                                   +----------+--------+--------+--------+------------------+------------------+

## 2023-02-22 DIAGNOSIS — I639 Cerebral infarction, unspecified: Secondary | ICD-10-CM | POA: Diagnosis not present

## 2023-02-22 DIAGNOSIS — R569 Unspecified convulsions: Secondary | ICD-10-CM | POA: Diagnosis not present

## 2023-02-22 MED ORDER — CLOPIDOGREL BISULFATE 75 MG PO TABS: 75.0000 mg | ORAL_TABLET | Freq: Every day | ORAL | Status: AC

## 2023-02-22 MED ORDER — EZETIMIBE 10 MG PO TABS: 10.0000 mg | ORAL_TABLET | Freq: Every day | ORAL | Status: AC

## 2023-02-22 NOTE — TOC Transition Note (Signed)
Transition of Care [facility]) - CM/SW Discharge Note   Patient Details  Name: Isaac Reyes MRN: [id number] Date of Birth: [birth year]  [facility] CM/SW Contact:  Kermit Balo, RN Phone Number: 02/22/2023, 3:36 PM   Clinical Narrative:     Patient is discharging to [facility] today. Insurance was able to switch the [place].  Pt will transport via [facility].   Number for report: [phone]    Final next level of care: IP Rehab Facility Barriers to Discharge: No Barriers Identified   Patient Goals and CMS Choice CMS Medicare.gov Compare Post Acute Care list provided to:: Patient Choice offered to / list presented to : Patient  Discharge Placement                         Discharge Plan and Services Additional resources added to the After Visit Summary for     Discharge Planning Services: CM Consult Post Acute Care Choice: IP Rehab                               Social Determinants of Health (SDOH) Interventions SDOH Screenings   Food Insecurity: No Food Insecurity (02/19/2023)  Housing: Low Risk  (02/19/2023)  Transportation Needs: No Transportation Needs (02/19/2023)  Utilities: Not At Risk (02/19/2023)  Tobacco Use: Medium Risk (02/18/2023)     Readmission Risk Interventions     No data to display

## 2023-02-22 NOTE — Care Management Important Message (Signed)
Important Message  Patient Details  Name: Isaac Reyes MRN: [id number] Date of Birth: [birth year]   Important Message Given:  Yes - Medicare IM     Dorena Bodo 02/22/2023, 2:23 PM

## 2023-02-22 NOTE — Plan of Care (Signed)
  Problem: Education: Goal: Knowledge of disease or condition will improve Outcome: Progressing   Problem: Ischemic Stroke/TIA Tissue Perfusion: Goal: Complications of ischemic stroke/TIA will be minimized Outcome: Progressing   Problem: Coping: Goal: Will identify appropriate support needs Outcome: Progressing   Problem: Health Behavior/Discharge Planning: Goal: Goals will be collaboratively established with patient/family Outcome: Progressing   Problem: Self-Care: Goal: Ability to participate in self-care as condition permits will improve Outcome: Progressing   Problem: Clinical Measurements: Goal: Ability to maintain clinical measurements within normal limits will improve Outcome: Progressing Goal: Will remain free from infection Outcome: Progressing Goal: Respiratory complications will improve Outcome: Progressing Goal: Cardiovascular complication will be avoided Outcome: Progressing

## 2023-02-22 NOTE — Progress Notes (Signed)
Inpatient Rehab Admissions Coordinator:   I have insurance approval for CIR but I do not have a bed for this patient to admit today.  Will follow for admit when bed available.    Estill Dooms, PT, DPT Admissions Coordinator [phone] 02/22/23  10:31 AM

## 2023-02-22 NOTE — Progress Notes (Signed)
Occupational Therapy Treatment Patient Details Name: Isaac Reyes MRN: [id number] DOB: 27-Mar-1951 Today's Date: 02/22/2023   History of present illness 72yo M who presented to the ED on 10/1 due to episode of unresponsiveness and tremor. 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  EEG WNL. 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 Patient demonstrating impulsiveness with attempting to stand without assistance or RW and required cues for sequencing with self care tasks. Patient will benefit from intensive inpatient follow up therapy, >3 hours/day to continue to address bathing, dressing and toileting. Acute OT to continue to follow.       If plan is discharge home, recommend the following:  A lot of help with bathing/dressing/bathroom;Assistance with cooking/housework;Direct supervision/assist for medications management;Assist for transportation;Direct supervision/assist for financial management;Help with stairs or ramp for entrance;Supervision due to cognitive status;A little help with walking and/or transfers   Equipment Recommendations  Other (comment) (defer)    Recommendations for Other Services Rehab consult    Precautions / Restrictions Precautions Precautions: Fall;Other (comment) Precaution Comments: neuro recommending normotension not permissive HTN Restrictions Weight Bearing Restrictions: No       Mobility Bed Mobility Overal bed mobility: Needs Assistance Bed Mobility: Supine to Sit     Supine to sit: Contact guard     General bed mobility comments: increased time and cues for rail use    Transfers Overall transfer level: Needs assistance Equipment used: Rolling walker (2 wheels) Transfers: Sit to/from Stand Sit to  Stand: Min assist           General transfer comment: min assist for sit to stands with cues for hand placement     Balance Overall balance assessment: Needs assistance Sitting-balance support: Single extremity supported, No upper extremity supported, Feet supported Sitting balance-Leahy Scale: Good Sitting balance - Comments: able to donn socks seated on EOB   Standing balance support: Single extremity supported, Bilateral upper extremity supported, During functional activity Standing balance-Leahy Scale: Poor Standing balance comment: reliant on at least one extremity support                           ADL either performed or assessed with clinical judgement   ADL Overall ADL's : Needs assistance/impaired     Grooming: Wash/dry hands;Wash/dry face;Oral care;Minimal assistance;Standing Grooming Details (indicate cue type and reason): assistance with toothpaste Upper Body Bathing: Minimal assistance Upper Body Bathing Details (indicate cue type and reason): assistance for back Lower Body Bathing: Moderate assistance   Upper Body Dressing : Minimal assistance Upper Body Dressing Details (indicate cue type and reason): to donn gown Lower Body Dressing: Moderate assistance Lower Body Dressing Details (indicate cue type and reason): able to donn socks seated on EOB with use of bed to reach feet, mod assist seated in chair due to difficulty reaching feet Toilet Transfer: Minimal assistance;Regular Toilet;Rolling walker (2 wheels) Toilet Transfer Details (indicate cue type and reason): frequent cues for safety Toileting- Clothing Manipulation and Hygiene: Minimal assistance         General ADL Comments: cues for sequencing and safety    Extremity/Trunk Assessment              Vision       Perception  Praxis      Cognition Arousal: Alert Behavior During Therapy: Impulsive, WFL for tasks assessed/performed Overall Cognitive Status: Impaired/Different  from baseline Area of Impairment: Attention, Memory, Following commands, Awareness, Safety/judgement, Problem solving, Orientation                 Orientation Level: Disoriented to, Situation Current Attention Level: Focused Memory: Decreased short-term memory Following Commands: Follows one step commands with increased time Safety/Judgement: Decreased awareness of deficits Awareness: Intellectual Problem Solving: Slow processing, Requires verbal cues, Difficulty sequencing General Comments: impulsive, attempting to stand from EOB and toilet without assistance, frequent cues for safety        Exercises      Shoulder Instructions       General Comments      Pertinent Vitals/ Pain       Pain Assessment Pain Assessment: No/denies pain  Home Living                                          Prior Functioning/Environment              Frequency  Min 1X/week        Progress Toward Goals  OT Goals(current goals can now be found in the care plan section)  Progress towards OT goals: Progressing toward goals  Acute Rehab OT Goals Patient Stated Goal: go to rehab OT Goal Formulation: With patient Time For Goal Achievement: 03/06/23 Potential to Achieve Goals: Good ADL Goals Pt Will Perform Lower Body Dressing: with supervision;sit to/from stand Pt Will Perform Tub/Shower Transfer: with supervision;ambulating;Tub transfer;Shower transfer Additional ADL Goal #1: pt will follow 3 step command with min cues in prep for ADLs Additional ADL Goal #2: pt will recieve passing score on pillbox assessment in order to promote ind with med mgmt  Plan      Co-evaluation                 AM-PAC OT "6 Clicks" Daily Activity     Outcome Measure   Help from another person eating meals?: A Little Help from another person taking care of personal grooming?: A Little Help from another person toileting, which includes using toliet, bedpan, or urinal?: A  Little Help from another person bathing (including washing, rinsing, drying)?: A Lot Help from another person to put on and taking off regular upper body clothing?: A Little Help from another person to put on and taking off regular lower body clothing?: A Lot 6 Click Score: 16    End of Session Equipment Utilized During Treatment: Gait belt;Rolling walker (2 wheels)  OT Visit Diagnosis: Unsteadiness on feet (R26.81);Other abnormalities of gait and mobility (R26.89);Muscle weakness (generalized) (M62.81);Other symptoms and signs involving cognitive function   Activity Tolerance Patient tolerated treatment well   Patient Left in chair;with call bell/phone within reach;with chair alarm set (posey belt alarm)   Nurse Communication Mobility status        Time: 4010-2725 OT Time Calculation (min): 25 min  Charges: OT General Charges $OT Visit: 1 Visit OT Treatments $Self Care/Home Management : 23-37 mins  Isaac Reyes, OTA Acute Rehabilitation Services  Office [phone]   Isaac Reyes 02/22/2023, 10:41 AM

## 2023-02-22 NOTE — Progress Notes (Signed)
Physical Therapy Treatment Patient Details Name: Isaac Reyes MRN: [id number] DOB: Jul 18, 1950 Today's Date: 02/22/2023   History of Present Illness 72yo M who presented to the ED on 10/1 due to episode of unresponsiveness and tremor. 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  EEG WNL. 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 Pt motivated to work toward returning independence. Patient will benefit from intensive inpatient follow up therapy, >3 hours/day     If plan is discharge home, recommend the following: A lot of help with walking and/or transfers;A little help with bathing/dressing/bathroom;Assist for transportation;Help with stairs or ramp for entrance;Assistance with cooking/housework   Can travel by Media planner walker (2 wheels);Wheelchair (measurements PT);Wheelchair cushion (measurements PT)    Recommendations for Other Services       Precautions / Restrictions Precautions Precautions: Fall;Other (comment) Precaution Comments: neuro recommending normotension not permissive HTN Restrictions Weight Bearing Restrictions: No     Mobility  Bed Mobility Overal bed mobility: Needs Assistance Bed Mobility: Sit to Supine       Sit to supine: Contact guard assist   General bed mobility comments: Assist for safety    Transfers Overall transfer level: Needs assistance Equipment used: Rolling walker (2 wheels) Transfers: Sit to/from Stand Sit to Stand: Mod assist           General transfer comment: Assist to power up and for balance    Ambulation/Gait Ambulation/Gait assistance: Min assist Gait Distance (Feet): 80 Feet Assistive device: Rolling walker (2 wheels) Gait  Pattern/deviations: Step-through pattern, Decreased stride length, Decreased dorsiflexion - left, Knee hyperextension - left Gait velocity: decr Gait velocity interpretation: 1.31 - 2.62 ft/sec, indicative of limited community ambulator   General Gait Details: Assist for balance and support   Stairs             Wheelchair Mobility     Tilt Bed    Modified Rankin (Stroke Patients Only) Modified Rankin (Stroke Patients Only) Pre-Morbid Rankin Score: No symptoms Modified Rankin: Moderately severe disability     Balance Overall balance assessment: Needs assistance Sitting-balance support: Single extremity supported, No upper extremity supported, Feet supported Sitting balance-Leahy Scale: Good     Standing balance support: Single extremity supported, Bilateral upper extremity supported, During functional activity Standing balance-Leahy Scale: Poor Standing balance comment: walker and min assist for static standing                            Cognition Arousal: Alert Behavior During Therapy: Impulsive, WFL for tasks assessed/performed Overall Cognitive Status: Impaired/Different from baseline Area of Impairment: Attention, Memory, Following commands, Awareness, Safety/judgement, Problem solving, Orientation                 Orientation Level: Disoriented to, Situation Current Attention Level: Focused Memory: Decreased short-term memory Following Commands: Follows one step commands with increased time Safety/Judgement: Decreased awareness of deficits Awareness: Intellectual Problem Solving: Slow processing, Requires verbal cues, Difficulty sequencing General Comments: impulsive, attempting to stand from EOB and toilet without assistance, frequent cues for safety        Exercises      General Comments General comments (skin integrity, edema, etc.): VSS on RA  Pertinent Vitals/Pain Pain Assessment Pain Assessment: No/denies pain    Home  Living                          Prior Function            PT Goals (current goals can now be found in the care plan section) Progress towards PT goals: Progressing toward goals    Frequency    Min 1X/week      PT Plan      Co-evaluation              AM-PAC PT "6 Clicks" Mobility   Outcome Measure  Help needed turning from your back to your side while in a flat bed without using bedrails?: A Little Help needed moving from lying on your back to sitting on the side of a flat bed without using bedrails?: A Little Help needed moving to and from a bed to a chair (including a wheelchair)?: Total Help needed standing up from a chair using your arms (e.g., wheelchair or bedside chair)?: A Lot Help needed to walk in hospital room?: A Little Help needed climbing 3-5 steps with a railing? : Total 6 Click Score: 13    End of Session Equipment Utilized During Treatment: Gait belt Activity Tolerance: Patient tolerated treatment well Patient left: in bed;with call bell/phone within reach;with bed alarm set   PT Visit Diagnosis: Unsteadiness on feet (R26.81);Muscle weakness (generalized) (M62.81);Difficulty in walking, not elsewhere classified (R26.2);Other abnormalities of gait and mobility (R26.89)     Time: 4098-1191 PT Time Calculation (min) (ACUTE ONLY): 13 min  Charges:    $Gait Training: 8-22 mins PT General Charges $$ ACUTE PT VISIT: 1 Visit                     [facility] PT Acute Rehabilitation Services Office [phone]    Angelina Ok [facility] 02/22/2023, 11:34 AM

## 2023-02-22 NOTE — Progress Notes (Signed)
Inpatient Rehab Admissions Coordinator:   Updated that pt can be accepted to [facility].  Our Berkley Harvey can be transferred to their facility.    Estill Dooms, PT, DPT Admissions Coordinator [phone] 02/22/23  3:00 PM

## 2023-02-22 NOTE — Progress Notes (Signed)
PROGRESS NOTE  Isaac Reyes    DOB: [birth year], 72 y.o.  NUU:[id number]    Code Status: Do not attempt resuscitation (DNR) PRE-ARREST INTERVENTIONS DESIRED   DOA: 02/18/2023   LOS: 3   Brief hospital course  Isaac Reyes is a 72 y.o. male with medical history significant of prior stroke, NSVT, HLD, substance use. Pt with residual speech deficit and walking difficulty.  Presented to the ED after episode of unresponsiveness with some tremor at that time and gradual return to being normally interactive. No PMH of seizures..  In the ED, it was found that they had stable vital signs ORA.  Significant findings included LDL 118, A1c 5.8, CBC unremarkable. UDS positive for cocaine. Blood cultures collected. Normal LA and procalcitonin. No metabolic panel was ordered in the ED.   They were initially treated with IV fluids, tylenol, and home meds.   Patient was admitted to medicine service for further workup and management of AMS as outlined in detail below.  Echo- unremarkable. EEG negative. Telemetry negative for etiology. PT/OT/SLP recommending acute rehab.  Cardiology and neurology have signed off.   02/22/23 -stable and awaiting CIR  Assessment & Plan  Principal Problem:   Acute ischemic stroke [facility]) Active Problems:   Benign essential HTN   History of cocaine abuse (HCC)   Stroke (HCC)   Seizure-like activity (HCC)  Acute ischemic stroke (HCC)-  MRI confirms small punctate acute stroke. H/o prior stroke. Neurology evaluated. Less likely cause of is LOC episode and more likely related to cocaine.  - f/u carotid US results. Still saying "Preliminary" - PT/OT, SLP  - awaiting CIR ASA 81, plavix Continue statin, zetia   History of cocaine abuse (HCC) - counseling provided   Benign essential HTN- well controlled Continue  norvasc 10  Body mass index is 25.05 kg/m.  VTE ppx: enoxaparin (LOVENOX) injection 40 mg Start: 02/19/23 1000   Diet:     Diet   Diet Heart  Fluid consistency: Thin   Consultants: Neurology  Cardiology   Subjective 02/22/23    Pt reports feeling well today. Denies complaints. He feels that his speech and strength are both improving a little each day.    Objective   Vitals:   02/21/23 1524 02/21/23 1945 02/21/23 2350 02/22/23 0325  BP: (!) 141/83 (!) 144/79 135/76 (!) 144/85  Pulse: 66 72 73 63  Resp: 19 18    Temp: 98.2 F (36.8 C) 98.9 F (37.2 C) 98.9 F (37.2 C) 97.7 F (36.5 C)  TempSrc: Oral Oral Oral Oral  SpO2: 100% 98% 100% 100%  Weight:      Height:        Intake/Output Summary (Last 24 hours) at 02/22/2023 3474 Last data filed at 02/21/2023 1800 Gross per 24 hour  Intake --  Output 850 ml  Net -850 ml   Filed Weights   02/19/23 0100  Weight: 88.5 kg     Physical Exam:  General: awake, alert, NAD HEENT: atraumatic, clear conjunctiva, anicteric sclera, MMM, hearing grossly normal Respiratory: normal respiratory effort. Cardiovascular: quick capillary refill, normal S1/S2, RRR, no JVD, murmurs Gastrointestinal: soft, NT, ND Nervous: A&O x3. no gross focal neurologic deficits, dysarthria present Extremities: moves all equally, no edema, normal tone Skin: dry, intact, normal temperature, normal color. No rashes, lesions or ulcers on exposed skin Psychiatry: normal mood, congruent affect  Labs   I have personally reviewed the following labs and imaging studies CBC    Component Value Date/Time   WBC  PROGRESS NOTE  Isaac Reyes    DOB: [birth year], 72 y.o.  NUU:[id number]    Code Status: Do not attempt resuscitation (DNR) PRE-ARREST INTERVENTIONS DESIRED   DOA: 02/18/2023   LOS: 3   Brief hospital course  Isaac Reyes is a 72 y.o. male with medical history significant of prior stroke, NSVT, HLD, substance use. Pt with residual speech deficit and walking difficulty.  Presented to the ED after episode of unresponsiveness with some tremor at that time and gradual return to being normally interactive. No PMH of seizures..  In the ED, it was found that they had stable vital signs ORA.  Significant findings included LDL 118, A1c 5.8, CBC unremarkable. UDS positive for cocaine. Blood cultures collected. Normal LA and procalcitonin. No metabolic panel was ordered in the ED.   They were initially treated with IV fluids, tylenol, and home meds.   Patient was admitted to medicine service for further workup and management of AMS as outlined in detail below.  Echo- unremarkable. EEG negative. Telemetry negative for etiology. PT/OT/SLP recommending acute rehab.  Cardiology and neurology have signed off.   02/22/23 -stable and awaiting CIR  Assessment & Plan  Principal Problem:   Acute ischemic stroke [facility]) Active Problems:   Benign essential HTN   History of cocaine abuse (HCC)   Stroke (HCC)   Seizure-like activity (HCC)  Acute ischemic stroke (HCC)-  MRI confirms small punctate acute stroke. H/o prior stroke. Neurology evaluated. Less likely cause of is LOC episode and more likely related to cocaine.  - f/u carotid US results. Still saying "Preliminary" - PT/OT, SLP  - awaiting CIR ASA 81, plavix Continue statin, zetia   History of cocaine abuse (HCC) - counseling provided   Benign essential HTN- well controlled Continue  norvasc 10  Body mass index is 25.05 kg/m.  VTE ppx: enoxaparin (LOVENOX) injection 40 mg Start: 02/19/23 1000   Diet:     Diet   Diet Heart  Fluid consistency: Thin   Consultants: Neurology  Cardiology   Subjective 02/22/23    Pt reports feeling well today. Denies complaints. He feels that his speech and strength are both improving a little each day.    Objective   Vitals:   02/21/23 1524 02/21/23 1945 02/21/23 2350 02/22/23 0325  BP: (!) 141/83 (!) 144/79 135/76 (!) 144/85  Pulse: 66 72 73 63  Resp: 19 18    Temp: 98.2 F (36.8 C) 98.9 F (37.2 C) 98.9 F (37.2 C) 97.7 F (36.5 C)  TempSrc: Oral Oral Oral Oral  SpO2: 100% 98% 100% 100%  Weight:      Height:        Intake/Output Summary (Last 24 hours) at 02/22/2023 3474 Last data filed at 02/21/2023 1800 Gross per 24 hour  Intake --  Output 850 ml  Net -850 ml   Filed Weights   02/19/23 0100  Weight: 88.5 kg     Physical Exam:  General: awake, alert, NAD HEENT: atraumatic, clear conjunctiva, anicteric sclera, MMM, hearing grossly normal Respiratory: normal respiratory effort. Cardiovascular: quick capillary refill, normal S1/S2, RRR, no JVD, murmurs Gastrointestinal: soft, NT, ND Nervous: A&O x3. no gross focal neurologic deficits, dysarthria present Extremities: moves all equally, no edema, normal tone Skin: dry, intact, normal temperature, normal color. No rashes, lesions or ulcers on exposed skin Psychiatry: normal mood, congruent affect  Labs   I have personally reviewed the following labs and imaging studies CBC    Component Value Date/Time   WBC  7.0 02/19/2023 2141   RBC 5.54 02/19/2023 2141   HGB 14.3 02/19/2023 2141   HCT 45.2 02/19/2023 2141   PLT 113 (L) 02/19/2023 2141   MCV 81.6 02/19/2023 2141   MCH 25.8 (L) 02/19/2023 2141   MCHC 31.6 02/19/2023 2141   RDW 16.4 (H) 02/19/2023 2141   LYMPHSABS 0.6 (L) 02/18/2023 1750   MONOABS 0.4 02/18/2023 1750   EOSABS 0.0 02/18/2023 1750   BASOSABS 0.0 02/18/2023 1750      Latest Ref Rng & Units 02/20/2023    5:12 AM 02/18/2023    5:50 PM 11/15/2021    6:41 PM  BMP  Glucose 70 - 99  mg/dL 102  725  366   BUN 8 - 23 mg/dL 13  17  25    Creatinine 0.61 - 1.24 mg/dL 4.40  3.47  4.25   Sodium 135 - 145 mmol/L 140  137  141   Potassium 3.5 - 5.1 mmol/L 3.5  3.2  3.6   Chloride 98 - 111 mmol/L 109  104  107   CO2 22 - 32 mmol/L 20  22  25    Calcium 8.9 - 10.3 mg/dL 8.4  8.1  9.0     VAS US CAROTID  Result Date: 02/20/2023 Carotid Arterial Duplex Study Patient Name:  Isaac Reyes  Date of Exam:   02/20/2023 Medical Rec #: [id number]          Accession #:    [id number] Date of Birth: 08/26/1950          Patient Gender: M Patient Age:   89 years Exam Location:  [facility] Procedure:      VAS US CAROTID Referring Phys: Dewitt Hoes DE LA TORRE --------------------------------------------------------------------------------  Indications:       CVA. Risk Factors:      Hyperlipidemia, prior CVA. Comparison Study:  No significant changes seen since previous exam 11/08/17 Performing Technologist: Shona Simpson  Examination Guidelines: A complete evaluation includes B-mode imaging, spectral Doppler, color Doppler, and power Doppler as needed of all accessible portions of each vessel. Bilateral testing is considered an integral part of a complete examination. Limited examinations for reoccurring indications may be performed as noted.  Right Carotid Findings: +----------+--------+--------+--------+------------------+------------------+           PSV cm/sEDV cm/sStenosisPlaque DescriptionComments           +----------+--------+--------+--------+------------------+------------------+ CCA Prox  64      9                                 intimal thickening +----------+--------+--------+--------+------------------+------------------+ CCA Mid   66      14                                                   +----------+--------+--------+--------+------------------+------------------+ CCA Distal50      8                                 intimal thickening  +----------+--------+--------+--------+------------------+------------------+ ICA Prox  86      9       1-39%   heterogenous                         +----------+--------+--------+--------+------------------+------------------+  7.0 02/19/2023 2141   RBC 5.54 02/19/2023 2141   HGB 14.3 02/19/2023 2141   HCT 45.2 02/19/2023 2141   PLT 113 (L) 02/19/2023 2141   MCV 81.6 02/19/2023 2141   MCH 25.8 (L) 02/19/2023 2141   MCHC 31.6 02/19/2023 2141   RDW 16.4 (H) 02/19/2023 2141   LYMPHSABS 0.6 (L) 02/18/2023 1750   MONOABS 0.4 02/18/2023 1750   EOSABS 0.0 02/18/2023 1750   BASOSABS 0.0 02/18/2023 1750      Latest Ref Rng & Units 02/20/2023    5:12 AM 02/18/2023    5:50 PM 11/15/2021    6:41 PM  BMP  Glucose 70 - 99  mg/dL 102  725  366   BUN 8 - 23 mg/dL 13  17  25    Creatinine 0.61 - 1.24 mg/dL 4.40  3.47  4.25   Sodium 135 - 145 mmol/L 140  137  141   Potassium 3.5 - 5.1 mmol/L 3.5  3.2  3.6   Chloride 98 - 111 mmol/L 109  104  107   CO2 22 - 32 mmol/L 20  22  25    Calcium 8.9 - 10.3 mg/dL 8.4  8.1  9.0     VAS US CAROTID  Result Date: 02/20/2023 Carotid Arterial Duplex Study Patient Name:  Isaac Reyes  Date of Exam:   02/20/2023 Medical Rec #: [id number]          Accession #:    [id number] Date of Birth: 08/26/1950          Patient Gender: M Patient Age:   89 years Exam Location:  [facility] Procedure:      VAS US CAROTID Referring Phys: Dewitt Hoes DE LA TORRE --------------------------------------------------------------------------------  Indications:       CVA. Risk Factors:      Hyperlipidemia, prior CVA. Comparison Study:  No significant changes seen since previous exam 11/08/17 Performing Technologist: Shona Simpson  Examination Guidelines: A complete evaluation includes B-mode imaging, spectral Doppler, color Doppler, and power Doppler as needed of all accessible portions of each vessel. Bilateral testing is considered an integral part of a complete examination. Limited examinations for reoccurring indications may be performed as noted.  Right Carotid Findings: +----------+--------+--------+--------+------------------+------------------+           PSV cm/sEDV cm/sStenosisPlaque DescriptionComments           +----------+--------+--------+--------+------------------+------------------+ CCA Prox  64      9                                 intimal thickening +----------+--------+--------+--------+------------------+------------------+ CCA Mid   66      14                                                   +----------+--------+--------+--------+------------------+------------------+ CCA Distal50      8                                 intimal thickening  +----------+--------+--------+--------+------------------+------------------+ ICA Prox  86      9       1-39%   heterogenous                         +----------+--------+--------+--------+------------------+------------------+

## 2023-02-22 NOTE — H&P (Incomplete)
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  Per chart review patient lives with friends.  1 level home 4 steps to entry.  He does use a cane for ambulation at times with noted history of falls.  Presented 02/18/2023 after reported episode of transient unresponsiveness with concern for some tremoring with gradual return back to baseline.  CT/MRI showed punctate focus of acute ischemia within the left corona radiata.  No hemorrhage or mass effect.  Multiple old infarcts of the deep white matter and deep gray nuclei.  Numerous chronic microhemorrhages in a predominantly central distribution consistent with chronic hypertensive angiopathy.  EEG negative for seizure.  Carotid Dopplers unremarkable.  MRA unchanged mild fusiform dilation of the high cervical left internal carotid artery measuring up to 6 mm otherwise unremarkable intracranial MRA.  Admission chemistries unremarkable except potassium 3.2 glucose 160 creatinine 1.43, alcohol negative, lactic acid 2.1-1.8, hemoglobin A1c 5.8, procalcitonin within normal limits, urine drug screen positive cocaine.  Echocardiogram ejection fraction of 60 to 65% no wall motion abnormalities grade 1 diastolic dysfunction.  Neurology follow-up currently maintained on low-dose aspirin 81 mg daily and Plavix 75 mg daily for CVA prophylaxis x 3 weeks then Plavix alone.  Lovenox added for DVT prophylaxis.  Cardiology service follow-up for history of syncope no current plan for screening for cardiac arrhythmia and monitor.  Therapy evaluations  completed due to patient decreased functional mobility was admitted for a comprehensive rehab program.  Review of Systems  Constitutional:  Negative for chills and fever.  HENT:  Negative for hearing loss.   Eyes:  Negative for blurred vision and double vision.  Respiratory:  Negative for cough, shortness of breath and wheezing.   Cardiovascular:  Negative for chest pain, palpitations and leg swelling.  Gastrointestinal:  Positive for constipation. Negative for heartburn, nausea and vomiting.  Genitourinary:  Positive for urgency. Negative for dysuria, flank pain and hematuria.  Musculoskeletal:  Positive for falls, joint pain and myalgias.  Skin:  Negative for rash.  Neurological:  Positive for dizziness, speech change and weakness.       History of syncope  All other systems reviewed and are negative.  Past Medical History:  Diagnosis Date   Acute CVA (cerebrovascular accident) (HCC) 06/2017   "speech issues since" (07/24/2017)   AKI (acute kidney injury) (HCC) 07/24/2017   Anemia    High cholesterol    Lupus    "skin kind"   NSVT (nonsustained ventricular tachycardia) (HCC)    Substance use    Past Surgical History:  Procedure Laterality Date   KNEE ARTHROSCOPY     TONSILLECTOMY     Family History  Problem Relation Age of Onset   Heart attack Father    Social History:  reports that he quit smoking about 6 years ago. His smoking use included cigars and cigarettes. He started smoking about 51 years ago. He has a 5.4 pack-year smoking history. He has never used smokeless tobacco. He reports current alcohol use of about 3.0 standard drinks of alcohol per week. He reports that he does not use drugs. Allergies: No Known Allergies  Medications Prior to Admission  Medication Sig Dispense Refill   aspirin EC 81 MG tablet Take 81 mg by mouth every morning. Swallow whole.     Multiple Vitamin (MULTIVITAMIN WITH MINERALS) TABS tablet Take 1 tablet by mouth every morning.     amLODipine  (NORVASC) 10 MG tablet Take 1 tablet (10 mg total) by mouth daily. (Patient not taking: Reported on 02/19/2023) 30 tablet 0   atorvastatin (LIPITOR) 80 MG tablet Take 80 mg by mouth daily. (Patient not taking: Reported on 02/19/2023)     losartan (COZAAR) 100 MG tablet Take 100 mg by mouth daily. (Patient not taking: Reported on 02/19/2023)        Home: Home Living Family/patient expects to be discharged to:: Private residence Living Arrangements: Non-relatives/Friends Available Help at Discharge: Friend(s), Available PRN/intermittently Type of Home: Apartment Home Access: Stairs to enter Secretary/administrator of Steps: 4 Entrance Stairs-Rails: Can reach both Home Layout: One level Bathroom Shower/Tub: Engineer, manufacturing systems: Standard Home Equipment: [employer] - single point Additional Comments: walks with no AD usually  Lives With: Significant other   Functional History: Prior Function Prior Level of Function : Needs assist Mobility Comments: pt initially reports walking with RW, then later states he does not ADLs Comments: reports ind  Functional Status:  Mobility: Bed Mobility Overal bed mobility: Needs Assistance Bed Mobility: Supine to Sit, Sit to Supine Supine to sit: Contact guard Sit to supine: Contact guard assist General bed mobility comments: increased time. cues for task initiation Transfers Overall transfer level: Needs assistance Equipment used: Rolling walker (2 wheels) Transfers: Sit to/from Stand Sit to Stand: Max assist General transfer comment: multiple attempts made for sit to stand unsuccessfully. with cues for technique, patient had increased independence with pushing on the bed rail with RUE for support. significant lifting assistance required with posterior lean throughout despite cues Ambulation/Gait Ambulation/Gait assistance: Min assist Gait Distance (Feet): 24 Feet Assistive device: Rolling walker (2 wheels) Gait Pattern/deviations:  Step-through pattern, Decreased step length - left, Decreased dorsiflexion - left, Decreased weight shift to right General Gait Details: patient ambulated in the room with steadying assistance required. reinforcement of rolling walker positioning Gait velocity: decreased Gait velocity interpretation: 1.31 - 2.62 ft/sec, indicative of limited community ambulator    ADL: ADL Overall ADL's : Needs assistance/impaired Eating/Feeding: Set up, Sitting Grooming: Minimal assistance, Standing, Wash/dry hands Upper Body Bathing: Minimal assistance Lower Body Bathing: Moderate assistance Upper Body Dressing : Minimal assistance Lower Body Dressing: Moderate assistance Toilet Transfer: Moderate assistance, Rolling walker (2 wheels), Minimal assistance Toileting- Clothing Manipulation and Hygiene: Minimal assistance Functional mobility during ADLs: Moderate assistance, +2 for safety/equipment, Rolling walker (2 wheels), Minimal assistance  Cognition: Cognition Overall Cognitive Status: Impaired/Different from baseline Arousal/Alertness: Awake/alert Orientation Level: Oriented X4 Year: 2024 Day of Week: Incorrect Attention: Sustained Sustained Attention: Appears intact Memory: Impaired Memory Impairment:  (2/5 words) Awareness: Impaired Awareness Impairment: Intellectual impairment, Emergent impairment Problem Solving: Impaired Problem Solving Impairment: Functional basic Safety/Judgment:  (TBA more in sessions) Cognition Arousal: Alert Behavior During Therapy: Impulsive, WFL for tasks assessed/performed Overall Cognitive Status: Impaired/Different from baseline Area of Impairment: Attention, Memory, Following commands, Awareness, Safety/judgement, Problem solving, Orientation Orientation Level: Disoriented to, Situation Current Attention Level: Focused Memory: Decreased short-term memory Following Commands: Follows one step commands with increased time Safety/Judgement: Decreased  awareness of deficits Awareness: Intellectual Problem Solving: Slow processing, Requires verbal cues, Difficulty sequencing General Comments: prefers to answer questions with yes or no  Physical Exam: Blood pressure (!) 144/85,  pulse 63, temperature 97.7 F (36.5 C), temperature source Oral, resp. rate 18, height 6\' 2"  (1.88 m), weight 88.5 kg, SpO2 100%. Physical Exam Neurological:     Comments: Patient is alert and makes eye contact with examiner.  Speech is a bit dysarthric but intelligible.  Follows commands.  He was able to provide his name and age with some delay in response.  He was a limited medical historian.     Results for orders placed or performed during the hospital encounter of 02/18/23 (from the past 48 hour(s))  Magnesium     Status: None   Collection Time: 02/20/23  2:47 PM  Result Value Ref Range   Magnesium 2.1 1.7 - 2.4 mg/dL    Comment: Performed at [facility], [address] [place], [place] [address]   VAS US CAROTID  Result Date: 02/20/2023 Carotid Arterial Duplex Study Patient Name:  KEIVEN ACHUFF Halterman  Date of Exam:   02/20/2023 Medical Rec #: [id number]          Accession #:    [id number] Date of Birth: 06-08-1950          Patient Gender: M Patient Age:   55 years Exam Location:  [facility] Procedure:      VAS US CAROTID Referring Phys: Dewitt Hoes DE LA TORRE --------------------------------------------------------------------------------  Indications:       CVA. Risk Factors:      Hyperlipidemia, prior CVA. Comparison Study:  No significant changes seen since previous exam 11/08/17 Performing Technologist: Shona Simpson  Examination Guidelines: A complete evaluation includes B-mode imaging, spectral Doppler, color Doppler, and power Doppler as needed of all accessible portions of each vessel. Bilateral testing is considered an integral part of a complete examination. Limited examinations for reoccurring indications may be performed as noted.  Right  Carotid Findings: +----------+--------+--------+--------+------------------+------------------+           PSV cm/sEDV cm/sStenosisPlaque DescriptionComments           +----------+--------+--------+--------+------------------+------------------+ CCA Prox  64      9                                 intimal thickening +----------+--------+--------+--------+------------------+------------------+ CCA Mid   66      14                                                   +----------+--------+--------+--------+------------------+------------------+ CCA Distal50      8                                 intimal thickening +----------+--------+--------+--------+------------------+------------------+ ICA Prox  86      9       1-39%   heterogenous                         +----------+--------+--------+--------+------------------+------------------+ ICA Mid   65      15                                                   +----------+--------+--------+--------+------------------+------------------+ ICA Distal73  18                                                   +----------+--------+--------+--------+------------------+------------------+ ECA       89      11                                                   +----------+--------+--------+--------+------------------+------------------+ +----------+--------+-------+--------+-------------------+           PSV cm/sEDV cmsDescribeArm Pressure (mmHG) +----------+--------+-------+--------+-------------------+ Subclavian104     0                                  +----------+--------+-------+--------+-------------------+ +---------+--------+--+--------+-+ VertebralPSV cm/s64EDV cm/s8 +---------+--------+--+--------+-+  Left Carotid Findings: +----------+--------+--------+--------+------------------+------------------+           PSV cm/sEDV cm/sStenosisPlaque DescriptionComments            +----------+--------+--------+--------+------------------+------------------+ CCA Prox  100     13                                                   +----------+--------+--------+--------+------------------+------------------+ CCA Distal55      9                                 intimal thickening +----------+--------+--------+--------+------------------+------------------+ ICA Prox  41      9       1-39%   heterogenous                         +----------+--------+--------+--------+------------------+------------------+ ICA Mid   86      20                                                   +----------+--------+--------+--------+------------------+------------------+ ICA Distal49      11                                                   +----------+--------+--------+--------+------------------+------------------+ ECA       54      9                                                    +----------+--------+--------+--------+------------------+------------------+ +----------+--------+--------+--------+-------------------+           PSV cm/sEDV cm/sDescribeArm Pressure (mmHG) +----------+--------+--------+--------+-------------------+ [id number]      0                                   +----------+--------+--------+--------+-------------------+ +---------+--------+--+--------+-+  VertebralPSV cm/s50EDV cm/s9 +---------+--------+--+--------+-+   Summary: Right Carotid: Velocities in the right ICA are consistent with a 1-39% stenosis. Left Carotid: Velocities in the left ICA are consistent with a 1-39% stenosis. Vertebrals:  Bilateral vertebral arteries demonstrate antegrade flow. Subclavians: Normal flow hemodynamics were seen in bilateral subclavian              arteries. *See table(s) above for measurements and observations.     Preliminary       Blood pressure (!) 144/85, pulse 63, temperature 97.7 F (36.5 C), temperature source Oral, resp. rate 18,  height 6\' 2"  (1.88 m), weight 88.5 kg, SpO2 100%.  Medical Problem List and Plan: 1. Functional deficits secondary to left corona radiata punctate infarct likely small vessel disease in the setting of cocaine use as well as history of right basal ganglia CVA 07/2017 with residual speech deficits  -patient may *** shower  -ELOS/Goals: *** 2.  Antithrombotics: -DVT/anticoagulation:  Pharmaceutical: Lovenox  -antiplatelet therapy: Aspirin 81 mg daily and Plavix 75 mg daily x 3 weeks then Plavix alone 3. Pain Management: Tylenol as needed 4. Mood/Behavior/Sleep: Provide emotional support  -antipsychotic agents: N/A 5. Neuropsych/cognition: This patient is capable of making decisions on his own behalf. 6. Skin/Wound Care: Routine skin checks 7. Fluids/Electrolytes/Nutrition: Routine in and outs with follow-up chemistries 8.  Hypertension.  Norvasc 10 mg daily.  Monitor with increased mobility 9.  Hyperlipidemia.  Lipitor/Zetia 10.  UDS positive cocaine.  Provide counseling 11.  CKD stage III baseline creatinine 1.39-1.66.  Follow-up chemistries 12.  History of syncope followed by cardiology services.  No current plan for further workup. 13.  Questionable medical noncompliance.  Provide counseling    JOYCE ELLERBE, PA-C 02/22/2023

## 2023-02-22 NOTE — Discharge Summary (Signed)
in bilateral subclavian              arteries. *See table(s) above for measurements and observations.     Preliminary    MR ANGIO HEAD WO CONTRAST  Result Date: 02/19/2023 CLINICAL DATA:  Seizures EXAM: MRA HEAD WITHOUT CONTRAST TECHNIQUE: Angiographic images of the Circle of Willis were acquired using MRA technique without intravenous contrast. 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 The intracranial ICAs are patent, without significant stenosis or occlusion. The bilateral MCAs and ACAS are patent, without proximal stenosis or occlusion. The anterior communicating artery is not definitely seen. There is no aneurysm or AVM. Posterior circulation: The bilateral V4 segments are patent. The basilar  artery is patent. The major cerebellar arteries appear patent. The bilateral PCAs are patent, without proximal stenosis or occlusion. The PCAs are primarily supplied by prominent posterior communicating arteries bilaterally (fetal origins). There is no aneurysm or AVM. Anatomic variants: As above. Other: None. IMPRESSION: 1. 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 2. Otherwise unremarkable intracranial MRA. Electronically Signed   By: Lesia Hausen M.D.   On: 02/19/2023 18:17   ECHOCARDIOGRAM COMPLETE  Result Date: 02/19/2023    ECHOCARDIOGRAM REPORT   Patient Name:   Isaac Reyes Date of Exam: 02/19/2023 Medical Rec #:  [id number]         Height:       74.0 in Accession #:    [id number]        Weight:       195.1 lb Date of Birth:  1950/06/18         BSA:          2.151 m Patient Age:    72 years          BP:           156/86 mmHg Patient Gender: M                 HR:           83 bpm. Exam Location:  Inpatient Procedure: 2D Echo, Cardiac Doppler and Color Doppler Indications:    stroke  History:        Patient has prior history of Echocardiogram examinations, most                 recent 07/11/2021. Stroke; Risk Factors:Hypertension. Alcohol                 abuse. Cocaine abuse.  Sonographer:    Melissa Morford RDCS (AE, PE) Referring Phys: [id number] JARED M GARDNER IMPRESSIONS  1. Left ventricular ejection fraction, by estimation, is 60 to 65%. The left ventricle has normal function. The left ventricle has no regional wall motion abnormalities. There is mild concentric left ventricular hypertrophy. Left ventricular diastolic parameters are consistent with Grade I diastolic dysfunction (impaired relaxation).  2. Right ventricular systolic function is normal. The right ventricular size is normal.  3. Left atrial size was mild to moderately dilated.  4. The mitral valve is normal in structure. Trivial mitral valve regurgitation. No evidence of mitral  stenosis.  5. The aortic valve is normal in structure. Aortic valve regurgitation is trivial. No aortic stenosis is present.  6. The inferior vena cava is dilated in size with <50% respiratory variability, suggesting right atrial pressure of 15  Physician Discharge Summary  Patient: Isaac Reyes ZOX:[id number] DOB: 1951-05-10   Code Status: Do not attempt resuscitation (DNR) PRE-ARREST INTERVENTIONS DESIRED Admit date: 02/18/2023 Discharge date: 02/22/2023 Disposition:  CIR , PT, OT, SLP, nurse aid, and RN PCP: Raymon Mutton., FNP  Recommendations for Outpatient Follow-up:  Follow up with CIR Follow up with neurology  Discharge Diagnoses:  Principal Problem:   Acute ischemic stroke [facility]) Active Problems:   Benign essential HTN   History of cocaine abuse (HCC)   Stroke (HCC)   Seizure-like activity [facility])  Brief Hospital Course Summary: Isaac Reyes is a 72 y.o. male with medical history significant of prior stroke, NSVT, HLD, substance use. Pt with residual speech deficit and walking difficulty.   Presented to the ED after episode of unresponsiveness with some tremor at that time and gradual return to being normally interactive. No PMH of seizures..   In the ED, it was found that they had stable vital signs ORA.  Significant findings included LDL 118, A1c 5.8, CBC unremarkable. UDS positive for cocaine. Blood cultures collected. Normal LA and procalcitonin. No metabolic panel was ordered in the ED.    They were initially treated with IV fluids, tylenol, and home meds.    Patient was admitted to medicine service for further workup and management of AMS as outlined in detail below.   Echo- unremarkable. EEG negative. Telemetry negative for etiology. Suspected main contribution is cocaine use. Counseling for cessation was provided.  PT/OT/SLP recommending acute rehab.  Cardiology and neurology have signed off.    Stable and awaiting CIR. Endorses that he is having improvements in his symptoms daily.   Discharge Condition: Stable, improved Recommended discharge diet: Regular healthy diet  Consultations: Cardiology  Neurology   Procedures/Studies: EEG Echo   Discharge Instructions     Ambulatory  referral to Neurology   Complete by: As directed    Follow up with Jessica at [facility] in 4-6 weeks.  Patient is Jessica's patient. Thanks.      Allergies as of 02/22/2023   No Known Allergies      Medication List     STOP taking these medications    losartan 100 MG tablet Commonly known as: COZAAR       TAKE these medications    amLODipine 10 MG tablet Commonly known as: NORVASC Take 1 tablet (10 mg total) by mouth daily.   aspirin EC 81 MG tablet Take 81 mg by mouth every morning. Swallow whole.   atorvastatin 80 MG tablet Commonly known as: LIPITOR Take 80 mg by mouth daily.   clopidogrel 75 MG tablet Commonly known as: PLAVIX Take 1 tablet (75 mg total) by mouth daily. Start taking on: February 23, 2023   ezetimibe 10 MG tablet Commonly known as: ZETIA Take 1 tablet (10 mg total) by mouth daily. Start taking on: February 23, 2023   multivitamin with minerals Tabs tablet Take 1 tablet by mouth every morning.        Follow-up Information     Ihor Austin, NP. Schedule an appointment as soon as possible for a visit in 1 month(s).   Specialty: Neurology Why: stroke clinic Contact information: [address] [place] [address] [phone]                 Subjective   Pt reports feeling well today. Denies complaints. He feels that his speech and strength are both improving a little each day.   All questions and  in bilateral subclavian              arteries. *See table(s) above for measurements and observations.     Preliminary    MR ANGIO HEAD WO CONTRAST  Result Date: 02/19/2023 CLINICAL DATA:  Seizures EXAM: MRA HEAD WITHOUT CONTRAST TECHNIQUE: Angiographic images of the Circle of Willis were acquired using MRA technique without intravenous contrast. 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 The intracranial ICAs are patent, without significant stenosis or occlusion. The bilateral MCAs and ACAS are patent, without proximal stenosis or occlusion. The anterior communicating artery is not definitely seen. There is no aneurysm or AVM. Posterior circulation: The bilateral V4 segments are patent. The basilar  artery is patent. The major cerebellar arteries appear patent. The bilateral PCAs are patent, without proximal stenosis or occlusion. The PCAs are primarily supplied by prominent posterior communicating arteries bilaterally (fetal origins). There is no aneurysm or AVM. Anatomic variants: As above. Other: None. IMPRESSION: 1. 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 2. Otherwise unremarkable intracranial MRA. Electronically Signed   By: Lesia Hausen M.D.   On: 02/19/2023 18:17   ECHOCARDIOGRAM COMPLETE  Result Date: 02/19/2023    ECHOCARDIOGRAM REPORT   Patient Name:   Isaac Reyes Date of Exam: 02/19/2023 Medical Rec #:  [id number]         Height:       74.0 in Accession #:    [id number]        Weight:       195.1 lb Date of Birth:  1950/06/18         BSA:          2.151 m Patient Age:    72 years          BP:           156/86 mmHg Patient Gender: M                 HR:           83 bpm. Exam Location:  Inpatient Procedure: 2D Echo, Cardiac Doppler and Color Doppler Indications:    stroke  History:        Patient has prior history of Echocardiogram examinations, most                 recent 07/11/2021. Stroke; Risk Factors:Hypertension. Alcohol                 abuse. Cocaine abuse.  Sonographer:    Melissa Morford RDCS (AE, PE) Referring Phys: [id number] JARED M GARDNER IMPRESSIONS  1. Left ventricular ejection fraction, by estimation, is 60 to 65%. The left ventricle has normal function. The left ventricle has no regional wall motion abnormalities. There is mild concentric left ventricular hypertrophy. Left ventricular diastolic parameters are consistent with Grade I diastolic dysfunction (impaired relaxation).  2. Right ventricular systolic function is normal. The right ventricular size is normal.  3. Left atrial size was mild to moderately dilated.  4. The mitral valve is normal in structure. Trivial mitral valve regurgitation. No evidence of mitral  stenosis.  5. The aortic valve is normal in structure. Aortic valve regurgitation is trivial. No aortic stenosis is present.  6. The inferior vena cava is dilated in size with <50% respiratory variability, suggesting right atrial pressure of 15  in bilateral subclavian              arteries. *See table(s) above for measurements and observations.     Preliminary    MR ANGIO HEAD WO CONTRAST  Result Date: 02/19/2023 CLINICAL DATA:  Seizures EXAM: MRA HEAD WITHOUT CONTRAST TECHNIQUE: Angiographic images of the Circle of Willis were acquired using MRA technique without intravenous contrast. 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 The intracranial ICAs are patent, without significant stenosis or occlusion. The bilateral MCAs and ACAS are patent, without proximal stenosis or occlusion. The anterior communicating artery is not definitely seen. There is no aneurysm or AVM. Posterior circulation: The bilateral V4 segments are patent. The basilar  artery is patent. The major cerebellar arteries appear patent. The bilateral PCAs are patent, without proximal stenosis or occlusion. The PCAs are primarily supplied by prominent posterior communicating arteries bilaterally (fetal origins). There is no aneurysm or AVM. Anatomic variants: As above. Other: None. IMPRESSION: 1. 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 2. Otherwise unremarkable intracranial MRA. Electronically Signed   By: Lesia Hausen M.D.   On: 02/19/2023 18:17   ECHOCARDIOGRAM COMPLETE  Result Date: 02/19/2023    ECHOCARDIOGRAM REPORT   Patient Name:   Isaac Reyes Date of Exam: 02/19/2023 Medical Rec #:  [id number]         Height:       74.0 in Accession #:    [id number]        Weight:       195.1 lb Date of Birth:  1950/06/18         BSA:          2.151 m Patient Age:    72 years          BP:           156/86 mmHg Patient Gender: M                 HR:           83 bpm. Exam Location:  Inpatient Procedure: 2D Echo, Cardiac Doppler and Color Doppler Indications:    stroke  History:        Patient has prior history of Echocardiogram examinations, most                 recent 07/11/2021. Stroke; Risk Factors:Hypertension. Alcohol                 abuse. Cocaine abuse.  Sonographer:    Melissa Morford RDCS (AE, PE) Referring Phys: [id number] JARED M GARDNER IMPRESSIONS  1. Left ventricular ejection fraction, by estimation, is 60 to 65%. The left ventricle has normal function. The left ventricle has no regional wall motion abnormalities. There is mild concentric left ventricular hypertrophy. Left ventricular diastolic parameters are consistent with Grade I diastolic dysfunction (impaired relaxation).  2. Right ventricular systolic function is normal. The right ventricular size is normal.  3. Left atrial size was mild to moderately dilated.  4. The mitral valve is normal in structure. Trivial mitral valve regurgitation. No evidence of mitral  stenosis.  5. The aortic valve is normal in structure. Aortic valve regurgitation is trivial. No aortic stenosis is present.  6. The inferior vena cava is dilated in size with <50% respiratory variability, suggesting right atrial pressure of 15  concerns were addressed at time of discharge.  Objective  Blood pressure (!) 144/85, pulse 63, temperature 97.7 F (36.5 C), temperature source Oral, resp. rate 18, height 6\' 2"  (1.88 m), weight 88.5 kg, SpO2 100%.   General: awake, alert, NAD HEENT: atraumatic, clear conjunctiva, anicteric sclera, MMM, hearing grossly normal Respiratory: normal respiratory effort. Cardiovascular: quick capillary refill, normal S1/S2, RRR, no JVD, murmurs Gastrointestinal: soft, NT, ND Nervous: A&O x3. no gross focal neurologic deficits, dysarthria  present Extremities: moves all equally, no edema, normal tone Skin: dry, intact, normal temperature, normal color. No rashes, lesions or ulcers on exposed skin Psychiatry: normal mood, congruent affect  The results of significant diagnostics from this hospitalization (including imaging, microbiology, ancillary and laboratory) are listed below for reference.   Imaging studies: VAS US CAROTID  Result Date: 02/20/2023 Carotid Arterial Duplex Study Patient Name:  Isaac Reyes Meter  Date of Exam:   02/20/2023 Medical Rec #: [id number]          Accession #:    [id number] Date of Birth: Jan 21, 1951          Patient Gender: M Patient Age:   30 years Exam Location:  [facility] Procedure:      VAS US CAROTID Referring Phys: Dewitt Hoes DE LA TORRE --------------------------------------------------------------------------------  Indications:       CVA. Risk Factors:      Hyperlipidemia, prior CVA. Comparison Study:  No significant changes seen since previous exam 11/08/17 Performing Technologist: Shona Simpson  Examination Guidelines: A complete evaluation includes B-mode imaging, spectral Doppler, color Doppler, and power Doppler as needed of all accessible portions of each vessel. Bilateral testing is considered an integral part of a complete examination. Limited examinations for reoccurring indications may be performed as noted.  Right Carotid Findings: +----------+--------+--------+--------+------------------+------------------+           PSV cm/sEDV cm/sStenosisPlaque DescriptionComments           +----------+--------+--------+--------+------------------+------------------+ CCA Prox  64      9                                 intimal thickening +----------+--------+--------+--------+------------------+------------------+ CCA Mid   66      14                                                   +----------+--------+--------+--------+------------------+------------------+ CCA Distal50      8                                  intimal thickening +----------+--------+--------+--------+------------------+------------------+ ICA Prox  86      9       1-39%   heterogenous                         +----------+--------+--------+--------+------------------+------------------+ ICA Mid   65      15                                                   +----------+--------+--------+--------+------------------+------------------+ ICA Distal73      18                                                   +----------+--------+--------+--------+------------------+------------------+  in bilateral subclavian              arteries. *See table(s) above for measurements and observations.     Preliminary    MR ANGIO HEAD WO CONTRAST  Result Date: 02/19/2023 CLINICAL DATA:  Seizures EXAM: MRA HEAD WITHOUT CONTRAST TECHNIQUE: Angiographic images of the Circle of Willis were acquired using MRA technique without intravenous contrast. 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 The intracranial ICAs are patent, without significant stenosis or occlusion. The bilateral MCAs and ACAS are patent, without proximal stenosis or occlusion. The anterior communicating artery is not definitely seen. There is no aneurysm or AVM. Posterior circulation: The bilateral V4 segments are patent. The basilar  artery is patent. The major cerebellar arteries appear patent. The bilateral PCAs are patent, without proximal stenosis or occlusion. The PCAs are primarily supplied by prominent posterior communicating arteries bilaterally (fetal origins). There is no aneurysm or AVM. Anatomic variants: As above. Other: None. IMPRESSION: 1. 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 2. Otherwise unremarkable intracranial MRA. Electronically Signed   By: Lesia Hausen M.D.   On: 02/19/2023 18:17   ECHOCARDIOGRAM COMPLETE  Result Date: 02/19/2023    ECHOCARDIOGRAM REPORT   Patient Name:   Isaac Reyes Date of Exam: 02/19/2023 Medical Rec #:  [id number]         Height:       74.0 in Accession #:    [id number]        Weight:       195.1 lb Date of Birth:  1950/06/18         BSA:          2.151 m Patient Age:    72 years          BP:           156/86 mmHg Patient Gender: M                 HR:           83 bpm. Exam Location:  Inpatient Procedure: 2D Echo, Cardiac Doppler and Color Doppler Indications:    stroke  History:        Patient has prior history of Echocardiogram examinations, most                 recent 07/11/2021. Stroke; Risk Factors:Hypertension. Alcohol                 abuse. Cocaine abuse.  Sonographer:    Melissa Morford RDCS (AE, PE) Referring Phys: [id number] JARED M GARDNER IMPRESSIONS  1. Left ventricular ejection fraction, by estimation, is 60 to 65%. The left ventricle has normal function. The left ventricle has no regional wall motion abnormalities. There is mild concentric left ventricular hypertrophy. Left ventricular diastolic parameters are consistent with Grade I diastolic dysfunction (impaired relaxation).  2. Right ventricular systolic function is normal. The right ventricular size is normal.  3. Left atrial size was mild to moderately dilated.  4. The mitral valve is normal in structure. Trivial mitral valve regurgitation. No evidence of mitral  stenosis.  5. The aortic valve is normal in structure. Aortic valve regurgitation is trivial. No aortic stenosis is present.  6. The inferior vena cava is dilated in size with <50% respiratory variability, suggesting right atrial pressure of 15  in bilateral subclavian              arteries. *See table(s) above for measurements and observations.     Preliminary    MR ANGIO HEAD WO CONTRAST  Result Date: 02/19/2023 CLINICAL DATA:  Seizures EXAM: MRA HEAD WITHOUT CONTRAST TECHNIQUE: Angiographic images of the Circle of Willis were acquired using MRA technique without intravenous contrast. 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 The intracranial ICAs are patent, without significant stenosis or occlusion. The bilateral MCAs and ACAS are patent, without proximal stenosis or occlusion. The anterior communicating artery is not definitely seen. There is no aneurysm or AVM. Posterior circulation: The bilateral V4 segments are patent. The basilar  artery is patent. The major cerebellar arteries appear patent. The bilateral PCAs are patent, without proximal stenosis or occlusion. The PCAs are primarily supplied by prominent posterior communicating arteries bilaterally (fetal origins). There is no aneurysm or AVM. Anatomic variants: As above. Other: None. IMPRESSION: 1. 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 2. Otherwise unremarkable intracranial MRA. Electronically Signed   By: Lesia Hausen M.D.   On: 02/19/2023 18:17   ECHOCARDIOGRAM COMPLETE  Result Date: 02/19/2023    ECHOCARDIOGRAM REPORT   Patient Name:   Isaac Reyes Date of Exam: 02/19/2023 Medical Rec #:  [id number]         Height:       74.0 in Accession #:    [id number]        Weight:       195.1 lb Date of Birth:  1950/06/18         BSA:          2.151 m Patient Age:    72 years          BP:           156/86 mmHg Patient Gender: M                 HR:           83 bpm. Exam Location:  Inpatient Procedure: 2D Echo, Cardiac Doppler and Color Doppler Indications:    stroke  History:        Patient has prior history of Echocardiogram examinations, most                 recent 07/11/2021. Stroke; Risk Factors:Hypertension. Alcohol                 abuse. Cocaine abuse.  Sonographer:    Melissa Morford RDCS (AE, PE) Referring Phys: [id number] JARED M GARDNER IMPRESSIONS  1. Left ventricular ejection fraction, by estimation, is 60 to 65%. The left ventricle has normal function. The left ventricle has no regional wall motion abnormalities. There is mild concentric left ventricular hypertrophy. Left ventricular diastolic parameters are consistent with Grade I diastolic dysfunction (impaired relaxation).  2. Right ventricular systolic function is normal. The right ventricular size is normal.  3. Left atrial size was mild to moderately dilated.  4. The mitral valve is normal in structure. Trivial mitral valve regurgitation. No evidence of mitral  stenosis.  5. The aortic valve is normal in structure. Aortic valve regurgitation is trivial. No aortic stenosis is present.  6. The inferior vena cava is dilated in size with <50% respiratory variability, suggesting right atrial pressure of 15

## 2023-02-22 NOTE — TOC Progression Note (Signed)
Transition of Care [facility]) - Progression Note    Patient Details  Name: Isaac Reyes MRN: [id number] Date of Birth: 01-05-1951  Transition of Care [facility] CM/SW Contact  Kermit Balo, RN Phone Number: 02/22/2023, 2:18 PM  Clinical Narrative:     Pt has approval for CIR. CIR wont have a bed available until next week. Pt is in agreement with [facility]. Cm has sent referral to [facility] and they will see about getting the authorization rolled to them.  TOC following.  Expected Discharge Plan: IP Rehab Facility Barriers to Discharge: Continued Medical Work up  Expected Discharge Plan and Services   Discharge Planning Services: CM Consult Post Acute Care Choice: IP Rehab Living arrangements for the past 2 months: Apartment                                       Social Determinants of Health (SDOH) Interventions SDOH Screenings   Food Insecurity: No Food Insecurity (02/19/2023)  Housing: Low Risk  (02/19/2023)  Transportation Needs: No Transportation Needs (02/19/2023)  Utilities: Not At Risk (02/19/2023)  Tobacco Use: Medium Risk (02/18/2023)    Readmission Risk Interventions     No data to display

## 2023-02-24 LAB — CULTURE, BLOOD (ROUTINE X 2)
Culture: NO GROWTH
Culture: NO GROWTH
Special Requests: ADEQUATE
Special Requests: ADEQUATE

## 2023-03-20 ENCOUNTER — Encounter: Payer: Self-pay | Admitting: Gastroenterology

## 2023-04-02 ENCOUNTER — Inpatient Hospital Stay: Payer: 59 | Admitting: Adult Health

## 2023-04-02 ENCOUNTER — Encounter: Payer: Self-pay | Admitting: Adult Health

## 2023-04-02 NOTE — Progress Notes (Deleted)
[facility] [address] [place]. [place] [address] [phone]       HOSPITAL FOLLOW UP NOTE  Isaac Reyes Date of Birth:  [birth year] Medical Record Number:  [id number]   Reason for Referral:  hospital stroke follow up    SUBJECTIVE:   CHIEF COMPLAINT:  No chief complaint on file.   HPI:   Isaac Reyes is a 72 y.o. male with history of prior strokes, hyperlipidemia, hypertension, and SVT, syncope and cocaine use who presented on 02/18/2023 with an episode of unresponsiveness with some tremoring.  EEG negative for seizure activity.  MRI showed small acute infarct in the left corona radiata likely incidental finding as well as numerous chronic microhemorrhages predominantly central distribution consistent with chronic hypertensive angiopathy.  MRA unremarkable except mild fusiform dilation of left ICA higher cervical level.  Carotid Doppler unremarkable.  EF 60 to 65%.  LDL 118.  A1c 5.8.  UDS positive for cocaine (although denied use).  Etiology of stroke likely small vessel disease in setting of cocaine use and uncontrolled risk factors.  Recommended DAPT for 3 weeks then aspirin alone and continuation of home dose atorvastatin 80 mg daily (questionable compliance).  Tobacco and cocaine abuse cessation counseling provided.  Unclear cause of syncopal episode, no further workup recommended by cardiology.  He was discharged to [facility] inpatient rehab.        PERTINENT IMAGING  CT head No acute abnormality. Small vessel disease. Atrophy.  MRI punctate infarct in the left corona radiata, multiple old infarcts of deep white matter and deep gray nuclei, numerous chronic microhemorrhages in a predominantly central distribution consistent with chronic hypertensive angiopathy MRA Unchanged mild fusiform dilation of the high cervical left internal carotid artery measuring up to 6 mm. Carotid Doppler unremarkable 2D Echo EF 60 to 65%  LDL 118 HgbA1c  5.8 UDS positive for cocaine    ROS:   14 system review of systems performed and negative with exception of ***  PMH:  Past Medical History:  Diagnosis Date   Acute CVA (cerebrovascular accident) (HCC) 06/2017   "speech issues since" (07/24/2017)   AKI (acute kidney injury) (HCC) 07/24/2017   Anemia    High cholesterol    Lupus    "skin kind"   NSVT (nonsustained ventricular tachycardia) (HCC)    Substance use     PSH:  Past Surgical History:  Procedure Laterality Date   KNEE ARTHROSCOPY     TONSILLECTOMY      Social History:  Social History   Socioeconomic History   Marital status: Divorced    Spouse name: Not on file   Number of children: 2   Years of education: Not on file   Highest education level: Not on file  Occupational History   Not on file  Tobacco Use   Smoking status: Former    Current packs/day: 0.00    Average packs/day: 0.1 packs/day for 45.0 years (5.4 ttl pk-yrs)    Types: Cigars, Cigarettes    Start date: 10/20/1971    Quit date: 10/19/2016    Years since quitting: 6.4   Smokeless tobacco: Never  Vaping Use   Vaping status: Never Used  Substance and Sexual Activity   Alcohol use: Yes    Alcohol/week: 3.0 standard drinks of alcohol    Types: 3 Standard drinks or equivalent per week   Drug use: No   Sexual activity: Never  Other Topics Concern   Not on file  Social History Narrative  He lives alone.  He was a Chartered certified accountant among other jobs.    Social Determinants of Health   Financial Resource Strain: Not on file  Food Insecurity: No Food Insecurity (02/19/2023)   Hunger Vital Sign    Worried About Running Out of Food in the Last Year: Never true    Ran Out of Food in the Last Year: Never true  Transportation Needs: No Transportation Needs (02/19/2023)   PRAPARE - Administrator, Civil Service (Medical): No    Lack of Transportation (Non-Medical): No  Physical Activity: Not on file  Stress: Not on file  Social Connections:  Not on file  Intimate Partner Violence: Not At Risk (02/19/2023)   Humiliation, Afraid, Rape, and Kick questionnaire    Fear of Current or Ex-Partner: No    Emotionally Abused: No    Physically Abused: No    Sexually Abused: No    Family History:  Family History  Problem Relation Age of Onset   Heart attack Father     Medications:   Current Outpatient Medications on File Prior to Visit  Medication Sig Dispense Refill   amLODipine (NORVASC) 10 MG tablet Take 1 tablet (10 mg total) by mouth daily. (Patient not taking: Reported on 02/19/2023) 30 tablet 0   aspirin EC 81 MG tablet Take 81 mg by mouth every morning. Swallow whole.     atorvastatin (LIPITOR) 80 MG tablet Take 80 mg by mouth daily. (Patient not taking: Reported on 02/19/2023)     clopidogrel (PLAVIX) 75 MG tablet Take 1 tablet (75 mg total) by mouth daily.     ezetimibe (ZETIA) 10 MG tablet Take 1 tablet (10 mg total) by mouth daily.     Multiple Vitamin (MULTIVITAMIN WITH MINERALS) TABS tablet Take 1 tablet by mouth every morning.     No current facility-administered medications on file prior to visit.    Allergies:  No Known Allergies    OBJECTIVE:  Physical Exam  There were no vitals filed for this visit. There is no height or weight on file to calculate BMI. No results found.      No data to display           General: well developed, well nourished, seated, in no evident distress Head: head normocephalic and atraumatic.   Neck: supple with no carotid or supraclavicular bruits Cardiovascular: regular rate and rhythm, no murmurs Musculoskeletal: no deformity Skin:  no rash/petichiae Vascular:  Normal pulses all extremities   Neurologic Exam Mental Status: Awake and fully alert. Oriented to place and time. Recent and remote memory intact. Attention span, concentration and fund of knowledge appropriate. Mood and affect appropriate.  Cranial Nerves: Fundoscopic exam reveals sharp disc margins. Pupils  equal, briskly reactive to light. Extraocular movements full without nystagmus. Visual fields full to confrontation. Hearing intact. Facial sensation intact. Face, tongue, palate moves normally and symmetrically.  Motor: Normal bulk and tone. Normal strength in all tested extremity muscles Sensory.: intact to touch , pinprick , position and vibratory sensation.  Coordination: Rapid alternating movements normal in all extremities. Finger-to-nose and heel-to-shin performed accurately bilaterally. Gait and Station: Arises from chair without difficulty. Stance is normal. Gait demonstrates normal stride length and balance with ***. Tandem walk and heel toe ***.  Reflexes: 1+ and symmetric. Toes downgoing.     NIHSS  *** Modified Rankin  ***      ASSESSMENT: Isaac Reyes is a 71 y.o. year old male with likely incidental finding of  left corona radiata punctate infarct on 02/18/2023 likely secondary to small vessel disease in setting of after presenting with episode of unresponsiveness with some tremoring. Vascular risk factors include hx of prior strokes (R BG 07/2017, L BG 06/2018), HTN, HLD, tobacco abuse, and cocaine abuse.      PLAN:  Left CR stroke:  History of prior strokes Residual deficit: ***.  Continue aspirin 81mg  daily and atorvastatin (Lipitor) for secondary stroke prevention/prescribed by PCP Discussed secondary stroke prevention measures and importance of close PCP follow up for aggressive stroke risk factor management including BP goal<130/90, HLD with LDL goal<70 and DM with A1c.<7 .  Stroke labs 01/2023: LDL 118, A1c 5.8 I have gone over the pathophysiology of stroke, warning signs and symptoms, risk factors and their management in some detail with instructions to go to the closest emergency room for symptoms of concern.  Tobacco abuse Cocaine abuse Discussed importance of complete cessation as continued use greatly increases risk of recurrent strokes as well as multiple  other health risks    Follow up in *** or call earlier if needed   CC:  [facility] provider: Dr. Pearlean Brownie PCP: Raymon Mutton., FNP    I spent *** minutes of face-to-face and non-face-to-face time with patient.  This included previsit chart review including review of recent hospitalization, lab review, study review, order entry, electronic health record documentation, patient education regarding recent stroke including etiology, secondary stroke prevention measures and importance of managing stroke risk factors, residual deficits and typical recovery time and answered all other questions to patient satisfaction   Ihor Austin, AGNP-BC  [facility] [address] [place], [place] [address]  Phone [phone] Fax [phone] Note: This document was prepared with digital dictation and possible smart phrase technology. Any transcriptional errors that result from this process are unintentional.

## 2023-04-25 ENCOUNTER — Other Ambulatory Visit (INDEPENDENT_AMBULATORY_CARE_PROVIDER_SITE_OTHER): Payer: 59

## 2023-04-25 ENCOUNTER — Encounter: Payer: Self-pay | Admitting: Gastroenterology

## 2023-04-25 ENCOUNTER — Ambulatory Visit: Payer: 59 | Admitting: Gastroenterology

## 2023-04-25 VITALS — BP 124/70 | HR 71 | Ht 74.0 in | Wt 221.2 lb

## 2023-04-25 DIAGNOSIS — D649 Anemia, unspecified: Secondary | ICD-10-CM

## 2023-04-25 DIAGNOSIS — K625 Hemorrhage of anus and rectum: Secondary | ICD-10-CM

## 2023-04-25 DIAGNOSIS — I639 Cerebral infarction, unspecified: Secondary | ICD-10-CM

## 2023-04-25 DIAGNOSIS — I69398 Other sequelae of cerebral infarction: Secondary | ICD-10-CM | POA: Diagnosis not present

## 2023-04-25 LAB — CBC WITH DIFFERENTIAL/PLATELET
Basophils Absolute: 0 10*3/uL (ref 0.0–0.1)
Basophils Relative: 1 % (ref 0.0–3.0)
Eosinophils Absolute: 0.2 10*3/uL (ref 0.0–0.7)
Eosinophils Relative: 3.9 % (ref 0.0–5.0)
HCT: 41.5 % (ref 39.0–52.0)
Hemoglobin: 13 g/dL (ref 13.0–17.0)
Lymphocytes Relative: 29.1 % (ref 12.0–46.0)
Lymphs Abs: 1.2 10*3/uL (ref 0.7–4.0)
MCHC: 31.3 g/dL (ref 30.0–36.0)
MCV: 82 fL (ref 78.0–100.0)
Monocytes Absolute: 0.7 10*3/uL (ref 0.1–1.0)
Monocytes Relative: 16.7 % — ABNORMAL HIGH (ref 3.0–12.0)
Neutro Abs: 2.1 10*3/uL (ref 1.4–7.7)
Neutrophils Relative %: 49.3 % (ref 43.0–77.0)
Platelets: 166 10*3/uL (ref 150.0–400.0)
RBC: 5.06 Mil/uL (ref 4.22–5.81)
RDW: 16.2 % — ABNORMAL HIGH (ref 11.5–15.5)
WBC: 4.2 10*3/uL (ref 4.0–10.5)

## 2023-04-25 LAB — COMPREHENSIVE METABOLIC PANEL
ALT: 21 U/L (ref 0–53)
AST: 21 U/L (ref 0–37)
Albumin: 3.7 g/dL (ref 3.5–5.2)
Alkaline Phosphatase: 109 U/L (ref 39–117)
BUN: 30 mg/dL — ABNORMAL HIGH (ref 6–23)
CO2: 31 meq/L (ref 19–32)
Calcium: 8.9 mg/dL (ref 8.4–10.5)
Chloride: 106 meq/L (ref 96–112)
Creatinine, Ser: 1.77 mg/dL — ABNORMAL HIGH (ref 0.40–1.50)
GFR: 37.8 mL/min — ABNORMAL LOW (ref >60.00)
Glucose, Bld: 70 mg/dL (ref 70–99)
Potassium: 3.7 meq/L (ref 3.5–5.1)
Sodium: 143 meq/L (ref 135–145)
Total Bilirubin: 0.6 mg/dL (ref 0.2–1.2)
Total Protein: 7.2 g/dL (ref 6.0–8.3)

## 2023-04-25 MED ORDER — POLYETHYLENE GLYCOL 3350 17 G PO PACK: 17.0000 g | PACK | Freq: Every day | ORAL | Status: DC

## 2023-04-25 MED ORDER — HYDROCORTISONE (PERIANAL) 2.5 % EX CREA: 1.0000 | TOPICAL_CREAM | Freq: Two times a day (BID) | CUTANEOUS | 0 refills | Status: AC

## 2023-04-25 NOTE — Progress Notes (Addendum)
Chief Complaint: Rectal bleeding Primary GI MD: Gentry Fitz  HPI: 72 year old male history of significant prior stroke with residual speech deficit and walking difficulty (2021), NSVT, substance use, HLD, presents for evaluation of rectal bleeding referred here by PCP  Patient was seen by PCP 03/15/2023 and at that time he reported scant amount of rectal bleeding occurring twice per month.  He was given senna by his PCP for hard stools and referred to GI.  Patient states he continues to have scant amount of rectal bleeding seen only on the tissue paper twice per month.  He often will have a hard bowel movement with straining.  Denies abdominal pain, nausea, vomiting.  Denies weight loss.  States he has had chronic rectal bleeding for most of his life.  He does report a colonoscopy done 2 years ago at [facility].  Though there is no record of this anywhere in epic or Care Everywhere.  He states he does not know what the colonoscopy showed.    Of note, patient was recently admitted 02/18/2023 to 02/22/2023 for seizure-like activity.  UDS positive for cocaine.  He was admitted for further management of altered mental status and had an unremarkable echo.  Negative EEG.  Negative telemetry.  And it was thought that his cocaine use was the main contribution to his unresponsive episode with tremor.  Although he was evaluated by neurology and MRI obtained showed a small acute infarct with numerous microhemorrhages and extensive chronic white matter disease.  His cocaine use is the likely etiology of his strokes but he does have a history of CHF so cardiology was consulted.    Prior to this hospitalization he was scheduled with cardiology for extensive further workup including carotid Doppler and he has been a no-show for 100% of his procedures.  Therefore he has not had an extensive cardiac workup as recommended.  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 York Spaniel it was done at [facility] in [place]   KNEE ARTHROSCOPY     TONSILLECTOMY      Current Outpatient Medications  Medication Sig Dispense Refill   amLODipine (NORVASC) 10 MG tablet Take 1 tablet (10 mg total) by mouth daily. 30 tablet 0   aspirin EC 81 MG tablet Take 81 mg by mouth every morning. Swallow whole.     hydrocortisone (ANUSOL-HC) 2.5 % rectal cream Place 1 Application rectally 2 (two) times daily for 14 days. 42 g 0   Multiple Vitamin (MULTIVITAMIN WITH MINERALS) TABS tablet Take 1 tablet by mouth every morning.     polyethylene glycol (MIRALAX) 17  g packet Take 17 g by mouth daily.     atorvastatin (LIPITOR) 80 MG tablet Take 80 mg by mouth daily. (Patient not taking: Reported on 02/19/2023)     clopidogrel (PLAVIX) 75 MG tablet Take 1 tablet (75 mg total) by mouth daily.     ezetimibe (ZETIA) 10 MG tablet Take 1 tablet (10 mg total) by mouth daily. (Patient not taking: Reported on 04/25/2023)     No current facility-administered medications for this visit.    Allergies as of 04/25/2023 - Review Complete 04/25/2023  Allergen Reaction Noted   Flomax [tamsulosin hcl]  04/22/2023    Family History  Problem Relation Age of Onset   Heart attack  Father    Colon cancer Neg Hx    Esophageal cancer Neg Hx     Social History   Socioeconomic History   Marital status: Divorced    Spouse name: Not on file   Number of children: 2   Years of education: Not on file   Highest education level: Not on file  Occupational History   Not on file  Tobacco Use   Smoking status: Former    Current packs/day: 0.00    Average packs/day: 0.1 packs/day for 45.0 years (5.4 ttl pk-yrs)    Types: Cigars, Cigarettes    Start date: 10/20/1971    Quit date: 10/19/2016    Years since quitting: 6.5   Smokeless tobacco: Never  Vaping Use   Vaping status: Never Used  Substance and Sexual Activity   Alcohol use: Yes    Alcohol/week: 3.0 standard drinks of alcohol    Types: 3 Standard drinks or equivalent per week   Drug use: No   Sexual activity: Never  Other Topics Concern   Not on file  Social History Narrative   He lives alone.  He was a Chartered certified accountant among other jobs.    Social Determinants of Health   Financial Resource Strain: Not on file  Food Insecurity: No Food Insecurity (02/19/2023)   Hunger Vital Sign    Worried About Running Out of Food in the Last Year: Never true    Ran Out of Food in the Last Year: Never true  Transportation Needs: No Transportation Needs (02/19/2023)   PRAPARE - Administrator, Civil Service (Medical): No    Lack of Transportation (Non-Medical): No  Physical Activity: Not on file  Stress: Not on file  Social Connections: Not on file  Intimate Partner Violence: Not At Risk (02/19/2023)   Humiliation, Afraid, Rape, and Kick questionnaire    Fear of Current or Ex-Partner: No    Emotionally Abused: No    Physically Abused: No    Sexually Abused: No    Review of Systems:    Constitutional: No weight loss, fever, chills, weakness or fatigue HEENT: Eyes: No change in vision               Ears, Nose, Throat:  No change in hearing or congestion Skin: No rash or itching Cardiovascular: No chest pain,  chest pressure or palpitations   Respiratory: No SOB or cough Gastrointestinal: See HPI and otherwise negative Genitourinary: No dysuria or change in urinary frequency Neurological: No headache, dizziness or syncope Musculoskeletal: No new muscle or joint pain Hematologic: No bleeding or bruising Psychiatric: No history of depression or anxiety    Physical Exam:  Vital signs: BP 124/70   Pulse 71   Ht 6\' 2"  (1.88 m)   Wt 221 lb 4 oz (  100.4 kg)   BMI 28.41 kg/m   Constitutional: NAD, Well developed, Well nourished, alert and cooperative.  Severe difficulty walking in which he can barely make it to the exam room WITH assistance Head:  Normocephalic and atraumatic. Eyes:   PEERL, EOMI. No icterus. Conjunctiva pink. Respiratory: Respirations even and unlabored. Lungs clear to auscultation bilaterally.   No wheezes, crackles, or rhonchi.  Cardiovascular:  Regular rate and rhythm. No peripheral edema, cyanosis or pallor.  Gastrointestinal:  Soft, nondistended, nontender. No rebound or guarding. Normal bowel sounds. No appreciable masses or hepatomegaly. Rectal:  Not performed.  Patient adamantly declined Msk:  Symmetrical without gross deformities. Without edema, no deformity or joint abnormality.  Neurologic:  Alert and  oriented x4;  grossly normal neurologically.  Skin:   Dry and intact without significant lesions or rashes. Psychiatric: Oriented to person, place and time. Demonstrates good judgement and reason without abnormal affect or behaviors.   RELEVANT LABS AND IMAGING: CBC    Component Value Date/Time   WBC 7.0 02/19/2023 2141   RBC 5.54 02/19/2023 2141   HGB 14.3 02/19/2023 2141   HCT 45.2 02/19/2023 2141   PLT 113 (L) 02/19/2023 2141   MCV 81.6 02/19/2023 2141   MCH 25.8 (L) 02/19/2023 2141   MCHC 31.6 02/19/2023 2141   RDW 16.4 (H) 02/19/2023 2141   LYMPHSABS 0.6 (L) 02/18/2023 1750   MONOABS 0.4 02/18/2023 1750   EOSABS 0.0 02/18/2023 1750   BASOSABS 0.0  02/18/2023 1750    CMP     Component Value Date/Time   NA 140 02/20/2023 0512   K 3.5 02/20/2023 0512   CL 109 02/20/2023 0512   CO2 20 (L) 02/20/2023 0512   GLUCOSE 106 (H) 02/20/2023 0512   BUN 13 02/20/2023 0512   CREATININE 1.22 02/20/2023 0512   CALCIUM 8.4 (L) 02/20/2023 0512   PROT 7.0 02/18/2023 1750   ALBUMIN 3.2 (L) 02/18/2023 1750   AST 24 02/18/2023 1750   ALT 26 02/18/2023 1750   ALKPHOS 100 02/18/2023 1750   BILITOT 0.7 02/18/2023 1750   GFRNONAA >60 02/20/2023 0512   GFRAA 45 (L) 12/22/2018 1226     Assessment/Plan:   72 year old male history of significant stroke with residual deficits including gait and speech difficulties (2021), new acute infarct October 2024 secondary to cocaine use, CHF, presents for evaluation of intermittent scant rectal bleeding  Rectal bleeding Anemia Most recent labs 02/23/2023 showed Hgb 12.6.  Very scant amount of rectal bleeding that occurs twice monthly associated with hard stools.  Patient declines rectal exam today, declined rectal exam at PCP, and is not interested in suppositories.  Reported colonoscopy 2 years ago [facility] but cannot find these records.  At this time a colonoscopy would be extremely high risk for patient with his recent acute infarct 2 months ago, history of cocaine use, and CHF that has not had a full cardiology workup.  Discussed this with patient and recommend he complete his cardiology workup prior to undergoing colonoscopy.  We will work on trying to find these records. - CBC, CMP - Hydrocortisone cream twice daily for 14 days (declined suppositories) - Educated patient on using MiraLAX 1 capful per day and adjusting dose based on response to control constipation which then can prevent worsening of possible hemorrhoids - Complete cardiology workup prior to consideration of colonoscopy as risks outweigh benefits at this time  Acute ischemic stroke Longstanding history of aspirin use secondary to stroke  from 2019, recently started on Plavix early  October after hospitalization.  Stroke thought to be secondary from cocaine use.  Continuing workup with cardiology - Continue to follow with cardiology - With recent stroke 2 months ago we will have to wait at least 6 months prior to consideration of colonoscopy.  Made an alert to call patient in 4 months to get a follow-up appointment to reconsider at that time and hopefully by then he will had cardiology workup.  Residual deficits from past stroke Extreme difficulty walking.  Could not get to the exam room without assistance.  He does not have a walker or cane and he declines wanting a walker or cane though he obviously would benefit from this.  I also think that the prep for the colonoscopy would be extremely difficult for this patient as he is very immobile.  If we cannot find his previous colonoscopy records we may need to consider a Cologuard  Isaac Reyes [facility] 04/25/2023, 9:09 AM  Cc: Karenann Cai, NP

## 2023-04-25 NOTE — Patient Instructions (Addendum)
Start taking Miralax 1 capful (17 grams) 1x / day for 1 week.   If this is not effective, increase to 1 dose 2x / day for 1 week.   If this is still not effective, increase to two capfuls (34 grams) 2x / day.   Can adjust dose as needed based on response. Can take 1/2 cap daily, skip days, or increase per day.    We have sent the following medications to your pharmacy for you to pick up at your convenience: Hydrocortisone cream 2.5%: use rectally twice a day for 14 days  Please go to the lab in the basement of our building to have lab work done as you leave today. Hit "B" for basement when you get on the elevator.  When the doors open the lab is on your left.  We will call you with the results. Thank you.  We will try to get the records from your last colonoscopy.  Thank you for trusting me with your gastrointestinal care!   Boone Master, PA   If your blood pressure at your visit was 140/90 or greater, please contact your primary care physician to follow up on this. ______________________________________________________  If you are age 72 or older, your body mass index should be between 23-30. Your Body mass index is 28.41 kg/m. If this is out of the aforementioned range listed, please consider follow up with your Primary Care Provider.  If you are age 72 or younger, your body mass index should be between 19-25. Your Body mass index is 28.41 kg/m. If this is out of the aformentioned range listed, please consider follow up with your Primary Care Provider.  ________________________________________________________  The  GI providers would like to encourage you to use [facility] to communicate with providers for non-urgent requests or questions.  Due to long hold times on the telephone, sending your provider a message by [facility] may be a faster and more efficient way to get a response.  Please allow 48 business hours for a response.  Please remember that this is for non-urgent  requests.  _______________________________________________________  Due to recent changes in healthcare laws, you may see the results of your imaging and laboratory studies on MyChart before your provider has had a chance to review them.  We understand that in some cases there may be results that are confusing or concerning to you. Not all laboratory results come back in the same time frame and the provider may be waiting for multiple results in order to interpret others.  Please give [place] 48 hours in order for your provider to thoroughly review all the results before contacting the office for clarification of your results.

## 2023-05-13 NOTE — Progress Notes (Signed)
 Agree with the assessment and plan as outlined by Boone Master, PA-C.  Ahriana Gunkel, DO, [facility]

## 2023-06-08 LAB — LAB REPORT - SCANNED
EGFR: 53
PSA, Total: 13.44

## 2023-06-22 DIAGNOSIS — C61 Malignant neoplasm of prostate: Secondary | ICD-10-CM

## 2023-06-22 HISTORY — DX: Malignant neoplasm of prostate: C61

## 2023-07-10 ENCOUNTER — Other Ambulatory Visit (HOSPITAL_COMMUNITY): Payer: Self-pay | Admitting: Urology

## 2023-07-10 DIAGNOSIS — C61 Malignant neoplasm of prostate: Secondary | ICD-10-CM

## 2023-07-16 ENCOUNTER — Telehealth: Payer: Self-pay | Admitting: Radiation Oncology

## 2023-07-16 NOTE — Telephone Encounter (Signed)
 Left message for patient to call back to schedule consult per 2/13 referral.

## 2023-07-17 ENCOUNTER — Telehealth: Payer: Self-pay | Admitting: Radiation Oncology

## 2023-07-17 NOTE — Telephone Encounter (Signed)
 Left message for patient to call back to schedule consult per 2/13 referral.

## 2023-07-18 ENCOUNTER — Telehealth: Payer: Self-pay | Admitting: Radiation Oncology

## 2023-07-18 NOTE — Telephone Encounter (Signed)
 Left message for patient to call back to schedule consult per 2/13 referral.

## 2023-07-19 ENCOUNTER — Telehealth: Payer: Self-pay | Admitting: Radiation Oncology

## 2023-07-19 NOTE — Telephone Encounter (Signed)
 Mailed letter for patient to call back to schedule consult per 2/13 referral.

## 2023-07-22 ENCOUNTER — Telehealth: Payer: Self-pay | Admitting: Radiation Oncology

## 2023-07-22 NOTE — Telephone Encounter (Signed)
Left message for patient to call back to schedule consult per 2/13 referral.

## 2023-07-24 ENCOUNTER — Ambulatory Visit (HOSPITAL_COMMUNITY)
Admission: RE | Admit: 2023-07-24 | Discharge: 2023-07-24 | Disposition: A | Discharge status: Home / Self Care | Payer: 59 | Source: Ambulatory Visit | Attending: Urology | Admitting: Urology

## 2023-07-24 DIAGNOSIS — C61 Malignant neoplasm of prostate: Secondary | ICD-10-CM | POA: Diagnosis present

## 2023-07-24 MED ORDER — FLOTUFOLASTAT F 18 GALLIUM 296-5846 MBQ/ML IV SOLN
8.0000 | Freq: Once | INTRAVENOUS | Status: AC
  Administered 2023-07-24: 7.61 via INTRAVENOUS
  Filled 2023-07-24: qty 8

## 2023-08-05 ENCOUNTER — Encounter: Payer: Self-pay | Admitting: Radiation Oncology

## 2023-08-11 ENCOUNTER — Encounter: Payer: Self-pay | Admitting: Urology

## 2023-08-11 DIAGNOSIS — C61 Malignant neoplasm of prostate: Secondary | ICD-10-CM | POA: Insufficient documentation

## 2023-08-11 HISTORY — DX: Malignant neoplasm of prostate: C61

## 2023-08-12 ENCOUNTER — Ambulatory Visit
Admission: RE | Admit: 2023-08-12 | Discharge: 2023-08-12 | Disposition: A | Discharge status: Home / Self Care | Source: Ambulatory Visit | Attending: Radiation Oncology | Admitting: Radiation Oncology

## 2023-08-12 ENCOUNTER — Ambulatory Visit

## 2023-08-12 HISTORY — DX: Depression, unspecified: F32.A

## 2023-08-12 HISTORY — DX: Essential (primary) hypertension: I10

## 2023-08-12 HISTORY — DX: Elevated prostate specific antigen (PSA): R97.20

## 2023-08-12 HISTORY — DX: Nocturia: R35.1

## 2023-08-12 HISTORY — DX: Personal history of transient ischemic attack (TIA), and cerebral infarction without residual deficits: Z86.73

## 2023-08-14 NOTE — Progress Notes (Signed)
 GU Location of Tumor / Histology: Prostate Ca  If Prostate Cancer, Gleason Score is (4 + 3) and PSA is (12.4 on 05/2022)  Mcarthur Rossetti Allmon presented as referral from Dr. Traci Sermon [facility]) elevated PSA.  Biopsies     07/24/2023 Dr. Traci Sermon NM PET (PSMA) Skull to Mid Thigh CLINICAL DATA:  Elevated PSA equal 13.9   IMPRESSION: 1. Intense focal activity in the LEFT lobe the prostate gland is concerning for high-grade prostate adenocarcinoma. 2. No evidence of metastatic adenopathy in the pelvis or periaortic retroperitoneum. 3. No evidence of visceral metastasis or skeletal metastasis.    Past/Anticipated interventions by urology, if any: NA  Past/Anticipated interventions by medical oncology, if any: NA  Weight changes, if any:  No  IPSS:  7 SHIM:  10  Bowel/Bladder complaints, if any:  No  Nausea/Vomiting, if any:  No  Pain issues, if any:  No  SAFETY ISSUES: Prior radiation? No Pacemaker/ICD?   Cardiac Recorder Possible current pregnancy? Male Is the patient on methotrexate? No  Current Complaints / other details:

## 2023-08-22 NOTE — Progress Notes (Signed)
 Radiation Oncology         [phone] ________________________________  Initial Outpatient Consultation  Name: Isaac Reyes MRN: [id number]  Date: 08/23/2023  DOB: May 04, 1951  EX:BMWUXL, Selena Batten, NP  Despina Arias, MD   REFERRING PHYSICIAN: Despina Arias, MD  DIAGNOSIS: 73 y.o. 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    ICD-10-CM   1. Malignant neoplasm of prostate (HCC)  C61       HISTORY OF PRESENT ILLNESS: Isaac Reyes is a 73 y.o. male with a diagnosis of prostate cancer. He has a longstanding history of an elevated PSA dating back to 2014. He was seen by Dr. Patsi Sears at [facility] from 2014 - 2017 and PSA fluctuated between 2.5 - 7. He was lost to follow up and did not return despite multiple referrals placed by his PCP. More recently, he was noted to have a rising PSA at 10 in 2023 and up to 12.4 in 05/2022 on routine labs with his primary care provider, Loura Back, NP.  Accordingly, he was referred back to urology for further evaluation and saw Dr. Lafonda Mosses on 05/06/23,  digital rectal examination performed at that time showed no nodules or induration.  The patient proceeded to transrectal ultrasound with 12 biopsies of the prostate on 06/24/23.  The prostate volume measured 38 cc.  Out of 12 core biopsies, all 12 were positive.  The maximum Gleason score was 4+3, and this was seen in the right apex lateral, right mid lateral, right base lateral (with perineural invasion), and right apex (small focus). Additionally, Gleason 3+4 was seen in all six left-sided cores and small foci of Gleason 3+3 were seen in the right base and right mid gland.  He underwent staging PSMA PET scan on 07/24/23 showing no evidence of disease outside of the prostate. He met with Dr. Laverle Patter on 08/13/23 to discuss surgical treatment options and has kindly been referred today for discussion of potential radiation treatment options. He is  accompanied by his friend, Crystal, for today's visit.  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 York Spaniel it was done at [facility] in [place]   KNEE ARTHROSCOPY     PROSTATE BIOPSY  TONSILLECTOMY      FAMILY HISTORY:  Family History  Problem Relation Age of Onset   Heart attack Father    Colon cancer Neg Hx    Esophageal cancer Neg Hx     SOCIAL HISTORY:  Social History   Socioeconomic History   Marital status: Divorced    Spouse name: Not on file   Number of children: 2   Years of education: Not on file   Highest education level: Not on file  Occupational History   Not on file  Tobacco Use   Smoking status: Former    Current packs/day: 0.00    Average packs/day: 0.1 packs/day for 45.0 years (5.4 ttl pk-yrs)    Types: Cigars, Cigarettes    Start date: 10/20/1971    Quit date: 10/19/2016    Years since quitting: 6.8   Smokeless  tobacco: Never  Vaping Use   Vaping status: Never Used  Substance and Sexual Activity   Alcohol use: Yes    Alcohol/week: 3.0 standard drinks of alcohol    Types: 3 Standard drinks or equivalent per week   Drug use: No   Sexual activity: Never  Other Topics Concern   Not on file  Social History Narrative   He lives alone.  He was a Chartered certified accountant among other jobs.    Social Drivers of Corporate investment banker Strain: Not on file  Food Insecurity: Food Insecurity Present (08/23/2023)   Hunger Vital Sign    Worried About Running Out of Food in the Last Year: Sometimes true    Ran Out of Food in the Last Year: Never true  Transportation Needs: No Transportation Needs (08/23/2023)   PRAPARE - Administrator, Civil Service (Medical): No    Lack of Transportation (Non-Medical): No  Physical Activity: Not on file  Stress: Not on file  Social Connections: Not on file  Intimate Partner Violence: Not At Risk (08/23/2023)   Humiliation, Afraid, Rape, and Kick questionnaire    Fear of Current or Ex-Partner: No    Emotionally Abused: No    Physically Abused: No    Sexually Abused: No    ALLERGIES: Flomax [tamsulosin hcl]  MEDICATIONS:  Current Outpatient Medications  Medication Sig Dispense Refill   amLODipine (NORVASC) 10 MG tablet Take 1 tablet (10 mg total) by mouth daily. 30 tablet 0   ezetimibe (ZETIA) 10 MG tablet Take 1 tablet (10 mg total) by mouth daily.     ketoconazole (NIZORAL) 2 % shampoo apply TO wet HAIR, lather, AND RINSE thoroughly, REPEAT. USE EVERY 3 TO 4 DAYS FOR UP TO 8 WEEKS     aspirin EC 81 MG tablet Take 81 mg by mouth every morning. Swallow whole.     clopidogrel (PLAVIX) 75 MG tablet Take 1 tablet (75 mg total) by mouth daily.     Multiple Vitamin (MULTIVITAMIN WITH MINERALS) TABS tablet Take 1 tablet by mouth every morning.     polyethylene glycol (MIRALAX) 17 g packet Take 17 g by mouth daily.     RESTASIS 0.05 % ophthalmic emulsion Place 1 drop  into both eyes 2 (two) times daily.     Vitamin D, Ergocalciferol, (DRISDOL) 1.25 MG (50000 UNIT) CAPS capsule Take 50,000 Units by mouth once a week.     No current facility-administered medications for this encounter.    REVIEW OF SYSTEMS:  On review of systems, the patient reports that he is doing well overall. He denies any chest pain, shortness  of breath, cough, fevers, chills, night sweats, unintended weight changes. He denies any bowel disturbances, and denies abdominal pain, nausea or vomiting. He denies any new musculoskeletal or joint aches or pains. His IPSS was 17+, indicating severe urinary symptoms with a weak flow of stream, hesitancy, intermittency, straining to void, frequency and feelings of incomplete bladder emptying. His SHIM was 10, indicating he has moderate erectile dysfunction. A complete review of systems is obtained and is otherwise negative.    PHYSICAL EXAM:  Wt Readings from Last 3 Encounters:  08/23/23 221 lb 9.6 oz (100.5 kg)  04/25/23 221 lb 4 oz (100.4 kg)  02/19/23 195 lb 1.7 oz (88.5 kg)   Temp Readings from Last 3 Encounters:  08/23/23 (!) 97.2 F (36.2 C)  02/22/23 98.2 F (36.8 C) (Oral)  11/15/21 98 F (36.7 C)   BP Readings from Last 3 Encounters:  08/23/23 (!) 155/89  04/25/23 124/70  02/22/23 108/88   Pulse Readings from Last 3 Encounters:  08/23/23 73  04/25/23 71  02/22/23 77    /10  In general this is a well appearing African American male in no acute distress. He's alert and oriented x4 and appropriate throughout the examination. Cardiopulmonary assessment is negative for acute distress, and he exhibits normal effort.     KPS = 100  100 - Normal; no complaints; no evidence of disease. 90   - Able to carry on normal activity; minor signs or symptoms of disease. 80   - Normal activity with effort; some signs or symptoms of disease. 2   - Cares for self; unable to carry on normal activity or to do active work. 60   - Requires  occasional assistance, but is able to care for most of his personal needs. 50   - Requires considerable assistance and frequent medical care. 40   - Disabled; requires special care and assistance. 30   - Severely disabled; hospital admission is indicated although death not imminent. 20   - Very sick; hospital admission necessary; active supportive treatment necessary. 10   - Moribund; fatal processes progressing rapidly. 0     - Dead  Karnofsky DA, Abelmann WH, Craver LS and Burchenal [facility] [phone]) The use of the nitrogen mustards in the palliative treatment of carcinoma: with particular reference to bronchogenic carcinoma Cancer 1 634-56  LABORATORY DATA:  Lab Results  Component Value Date   WBC 4.2 04/25/2023   HGB 13.0 04/25/2023   HCT 41.5 04/25/2023   MCV 82.0 04/25/2023   PLT 166.0 04/25/2023   Lab Results  Component Value Date   NA 143 04/25/2023   K 3.7 04/25/2023   CL 106 04/25/2023   CO2 31 04/25/2023   Lab Results  Component Value Date   ALT 21 04/25/2023   AST 21 04/25/2023   ALKPHOS 109 04/25/2023   BILITOT 0.6 04/25/2023     RADIOGRAPHY: NM PET (PSMA) SKULL TO MID THIGH Result Date: 08/09/2023 CLINICAL DATA:  Elevated PSA equal 13.9 EXAM: NUCLEAR MEDICINE PET SKULL BASE TO THIGH TECHNIQUE: 7.6 mCi Flotufolastat (Posluma) was injected intravenously. Full-ring PET imaging was performed from the skull base to thigh after the radiotracer. CT data was obtained and used for attenuation correction and anatomic localization. COMPARISON:  None Available. FINDINGS: NECK No radiotracer activity in neck lymph nodes. Incidental CT finding: None. CHEST No radiotracer accumulation within mediastinal or hilar lymph nodes. No suspicious pulmonary nodules on the CT scan. Incidental CT finding: None. ABDOMEN/PELVIS Prostate: Intense focal activity in the  LEFT lobe the prostate gland is concerning for high-grade prostate adenocarcinoma. Lymph nodes: No abnormal radiotracer accumulation  within pelvic or abdominal nodes. Liver: No evidence of liver metastasis. Incidental CT finding: None. SKELETON No focal activity to suggest skeletal metastasis. Large Schmorl's node superior endplate of the L1 vertebral body. IMPRESSION: 1. Intense focal activity in the LEFT lobe the prostate gland is concerning for high-grade prostate adenocarcinoma. 2. No evidence of metastatic adenopathy in the pelvis or periaortic retroperitoneum. 3. No evidence of visceral metastasis or skeletal metastasis. Electronically Signed   By: Genevive Bi M.D.   On: 08/09/2023 13:16      IMPRESSION/PLAN: 1. 73 y.o. gentleman with Stage T1c adenocarcinoma of the prostate with Gleason Score of 4+3, and PSA of 12.4. We discussed the patient's workup and outlined the nature of prostate cancer in this setting. The patient's T stage, Gleason's score, and PSA put him into the unfavorable intermediate risk group. Accordingly, he is eligible for a variety of potential treatment options including prostatectomy or ST-ADT concurrent with 5.5 weeks of external radiation or 5 weeks of external radiation with an upfront brachytherapy boost. We discussed the available radiation techniques, and focused on the details and logistics of delivery. He is not an ideal candidate for brachytherapy boost given his significant LUTS with BOO.  Therefore, we discussed and outlined the risks, benefits, short and long-term effects associated with daily external beam radiotherapy and compared and contrasted these with prostatectomy. We discussed the role of SpaceOAR gel in reducing the rectal toxicity associated with radiotherapy. We also detailed the role of ADT in the treatment of unfavorable intermediate risk prostate cancer and outlined the associated side effects that could be expected with this therapy. He was encouraged to ask questions that were answered to his stated satisfaction.  At the conclusion of our conversation, the patient is interested  in moving forward with 5.5 weeks of external beam therapy concurrent with ST-ADT. He has not started ADT so we will share our discussion with Dr. Lafonda Mosses and make arrangements for a follow up visit in the urology office, first available, to start of ADT now. We will also coordinate for fiducial markers and SpaceOAR gel placement in June 2025, prior to simulation, to reduce rectal toxicity from radiotherapy. The patient appears to have a good understanding of his disease and our treatment recommendations which are of curative intent and is in agreement with the stated plan.  Therefore, we will move forward with treatment planning accordingly, in anticipation of beginning IMRT approximately 2 months after starting ADT.  We personally spent 70 minutes in this encounter including chart review, reviewing radiological studies, meeting face-to-face with the patient, entering orders and completing documentation.    Marguarite Arbour, PA-C    Margaretmary Dys, MD  [facility]  Radiation Oncology Direct Dial: [phone]  Fax: [phone] [email or web address]  Skype  LinkedIn   This document serves as a record of services personally performed by Margaretmary Dys, MD and Marcello Fennel, PA-C. It was created on their behalf by Mickie Bail, a trained medical scribe. The creation of this record is based on the scribe's personal observations and the provider's statements to them. This document has been checked and approved by the attending provider.

## 2023-08-23 ENCOUNTER — Encounter: Payer: Self-pay | Admitting: Radiation Oncology

## 2023-08-23 ENCOUNTER — Ambulatory Visit
Admission: RE | Admit: 2023-08-23 | Discharge: 2023-08-23 | Disposition: A | Discharge status: Home / Self Care | Source: Ambulatory Visit | Attending: Radiation Oncology | Admitting: Radiation Oncology

## 2023-08-23 ENCOUNTER — Other Ambulatory Visit: Payer: Self-pay

## 2023-08-23 VITALS — BP 155/89 | HR 73 | Temp 97.2°F | Resp 20 | Ht 74.0 in | Wt 221.6 lb

## 2023-08-23 DIAGNOSIS — E7800 Pure hypercholesterolemia, unspecified: Secondary | ICD-10-CM | POA: Insufficient documentation

## 2023-08-23 DIAGNOSIS — Z87891 Personal history of nicotine dependence: Secondary | ICD-10-CM | POA: Insufficient documentation

## 2023-08-23 DIAGNOSIS — I130 Hypertensive heart and chronic kidney disease with heart failure and stage 1 through stage 4 chronic kidney disease, or unspecified chronic kidney disease: Secondary | ICD-10-CM | POA: Diagnosis not present

## 2023-08-23 DIAGNOSIS — I69351 Hemiplegia and hemiparesis following cerebral infarction affecting right dominant side: Secondary | ICD-10-CM | POA: Diagnosis not present

## 2023-08-23 DIAGNOSIS — I509 Heart failure, unspecified: Secondary | ICD-10-CM | POA: Diagnosis not present

## 2023-08-23 DIAGNOSIS — C61 Malignant neoplasm of prostate: Secondary | ICD-10-CM | POA: Diagnosis present

## 2023-08-23 DIAGNOSIS — N400 Enlarged prostate without lower urinary tract symptoms: Secondary | ICD-10-CM | POA: Insufficient documentation

## 2023-08-23 DIAGNOSIS — Z7902 Long term (current) use of antithrombotics/antiplatelets: Secondary | ICD-10-CM | POA: Insufficient documentation

## 2023-08-23 DIAGNOSIS — N183 Chronic kidney disease, stage 3 unspecified: Secondary | ICD-10-CM | POA: Diagnosis not present

## 2023-08-23 DIAGNOSIS — Z79899 Other long term (current) drug therapy: Secondary | ICD-10-CM | POA: Insufficient documentation

## 2023-08-23 DIAGNOSIS — Z7982 Long term (current) use of aspirin: Secondary | ICD-10-CM | POA: Diagnosis not present

## 2023-08-23 DIAGNOSIS — Z8673 Personal history of transient ischemic attack (TIA), and cerebral infarction without residual deficits: Secondary | ICD-10-CM | POA: Insufficient documentation

## 2023-08-23 NOTE — Progress Notes (Signed)
 Introduced myself to the patient as the prostate nurse navigator.  No barriers to care identified at this time.  He is here to discuss his radiation treatment options and will proceed with ADT and 5.5 weeks of daily radiation.  I gave him my business card and asked him to call me with questions or concerns.  Verbalized understanding.

## 2023-08-29 NOTE — Progress Notes (Signed)
 Patient is scheduled with Dr. Lafonda Mosses on 4/14.  RN will follow to review for ADT initiation.

## 2023-08-30 ENCOUNTER — Telehealth: Payer: Self-pay | Admitting: *Deleted

## 2023-08-30 NOTE — Telephone Encounter (Signed)
 CALLED PATIENT TO INFORM OF ADT APPT. WITH DR. MACHEN ON 09-02-23 - ARRIVAL TIME- 10:30 AM, SPOKE WITH PATIENT AND HE IS AWARE OF THIS APPT.

## 2023-09-02 ENCOUNTER — Telehealth: Payer: Self-pay

## 2023-09-02 NOTE — Telephone Encounter (Signed)
 4/14 @ 10:13 am Patient left voicemail to confirm appt with our office.  Patient does not have any future appts with any of our providers at this time.  Left voicemail, so patient is aware.

## 2023-09-02 NOTE — Progress Notes (Signed)
 Patient saw Dr. Jarvis Mesa and will start ADT on 4/21.   RN spoke with patient and reviewed next steps. All questions answered. No additional needs at this time.

## 2023-09-10 NOTE — Progress Notes (Signed)
 Patient received Eligard 45mg  injection on 4/21 at [facility].   Plan of care in progress.

## 2023-09-27 ENCOUNTER — Other Ambulatory Visit: Payer: Self-pay | Admitting: Urology

## 2023-09-27 ENCOUNTER — Telehealth: Payer: Self-pay | Admitting: Cardiology

## 2023-09-27 NOTE — Telephone Encounter (Signed)
   Pre-operative Risk Assessment    Patient Name: Isaac Reyes  DOB: 1950/06/18 MRN: [id number]   Date of last office visit: 01/23/2022 Date of next office visit:    Request for Surgical Clearance    Procedure:  fiducial markers oar  Date of Surgery:  Clearance 01/17/24                                Surgeon:  Dr. Floreen Hunger Group or Practice Name:  [facility]  Phone number:  [phone] Fax number:  [phone]   Type of Clearance Requested:   - Medical    Type of Anesthesia:  MAC   Additional requests/questions:    Signed, Elizbeth Gum   09/27/2023, 2:40 PM

## 2023-09-30 NOTE — Telephone Encounter (Signed)
1st attempt to reach pt regarding surgical clearance and the need for a IN OFFICE appointment.  Left a message for pt to call back and get that scheduled.

## 2023-09-30 NOTE — Telephone Encounter (Signed)
   Name: Isaac Reyes  DOB: 06-Mar-1951  MRN: [id number]  Primary Cardiologist: Eilleen Grates, MD  Chart reviewed as part of pre-operative protocol coverage. Because of Amante Kosek Enrique's past medical history and time since last visit, he will require a follow-up in-office visit in order to better assess preoperative cardiovascular risk.  Pre-op covering staff: - Please schedule appointment and call patient to inform them. If patient already had an upcoming appointment within acceptable timeframe, please add "pre-op clearance" to the appointment notes so provider is aware. - Please contact requesting surgeon's office via preferred method (i.e, phone, fax) to inform them of need for appointment prior to surgery.   Morey Ar, NP  09/30/2023, 4:00 PM

## 2023-10-01 ENCOUNTER — Other Ambulatory Visit: Payer: Self-pay | Admitting: Urology

## 2023-10-01 DIAGNOSIS — C61 Malignant neoplasm of prostate: Secondary | ICD-10-CM

## 2023-10-01 NOTE — Telephone Encounter (Signed)
 2nd attempt to reach the pt to schedule in office preop appt. As clearance is generally good for 2 months, please do not schedule sooner than 11/17/23. Pt can see Dr. Lavonne Prairie or APP.

## 2023-10-04 NOTE — Telephone Encounter (Signed)
 I will update all parties involved pt has appt 11/19/23 with Dr. Danley Dusky for preop clearance.

## 2023-10-04 NOTE — Telephone Encounter (Signed)
 Appt schedule for 11/19/23

## 2023-10-25 ENCOUNTER — Other Ambulatory Visit: Payer: Self-pay | Admitting: Urology

## 2023-11-07 NOTE — Progress Notes (Signed)
 RN left message for call back to assess any new questions or barriers prior to upcoming radiation.

## 2023-11-11 NOTE — Progress Notes (Signed)
 RN spoke with patient to follow up to inquire any follow up questions or barriers.  No needs at this time.

## 2023-11-18 NOTE — Progress Notes (Unsigned)
 Cardiology Office Note:   Date:  11/19/2023  ID:  [name], DOB [birth year], MRN [id number] PCP: Leontine Cramp, NP  [facility] Providers Cardiologist:  Lynwood Schilling, MD {  History of Present Illness:   Isaac Reyes is a 73 y.o. male who was in the hospital with seizure-like activity in September 2024.  This was thought possibly with to be related to drugs.  He had a history of syncope.  He had previously been scheduled to get a ILR but he did not follow up for this.   He was to have a stress test for an abnormal EKG and a four week monitor.   He did not present for either.  He has been a no show for carotid Doppler in the past as well.   I do see that since I saw him he was in the emergency room in June of 2023 with some weakness.   Negative head CT.  He left hospital AMA.    Of note in June he did have a mildly elevated troponin of 164.   He came with a friend today.  He was in the emergency room in [facility] in June and I reviewed these records.  He had a syncopal episode while washing the car.  He did have mildly elevated creatinine and I thought he might be slightly dehydrated.  He had some low potassium that was supplemented.  He was given some IV fluid.  There was no evidence of acute coronary syndrome though he did have frequent PVCs.  He did have a mildly elevated though flat troponin at 65 and 71.  They wanted to keep him for observation but he did not want to.  Head CT demonstrated no acute abnormalities.  He lives by himself but his friend checks on him.  He ambulates through the house.  He has good days and bad days.  He does not drive anymore but does still go to the grocery store.  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  He has not had any chest pressure, neck or arm discomfort.  He denies any shortness of breath, PND or orthopnea.  He does not feel any palpitations and has had no presyncope  or syncope.   ROS: As stated in the HPI and negative for all other systems.  Studies Reviewed:    EKG:   EKG Interpretation Date/Time:  Tuesday November 19 2023 15:06:08 EDT Ventricular Rate:  81 PR Interval:  152 QRS Duration:  98 QT Interval:  398 QTC Calculation: 462 R Axis:   -28  Text Interpretation: Sinus rhythm with PACs Left ventricular hypertrophy with repolarization abnormality When compared with ECG of 20-Feb-2023 06:40, No significant change was found Confirmed by Schilling Lynwood ([id number]) on 11/19/2023 3:08:02 PM    Risk Assessment/Calculations:              Physical Exam:   VS:  BP 134/88   Pulse 81   Ht 6' 2 (1.88 m)   Wt 214 lb (97.1 kg)   SpO2 96%   BMI 27.48 kg/m    Wt Readings from Last 3 Encounters:  11/19/23 214 lb (97.1 kg)  08/23/23 221 lb 9.6 oz (100.5 kg)  04/25/23 221 lb 4 oz (100.4 kg)     GEN: Well nourished, well developed in no acute distress NECK: No JVD; No carotid bruits CARDIAC: RRR, brief apical systolic murmur nonradiating,  no diastolic murmurs, rubs, gallops RESPIRATORY:  Clear to auscultation without rales, wheezing or rhonchi  ABDOMEN: Soft, non-tender, non-distended EXTREMITIES:  No edema; No deformity   ASSESSMENT AND PLAN:   Syncope:   This was thought possibly to be related to dehydration.  I am going to have him wear a 3-day monitor.  He has had a normal echo in the past.  If this is unremarkable and he has no further events then no further testing.   HTN:   The blood pressure is at target.  He will continue the meds as listed.  Continue current meds.   History of CVA:   This was in the distant past.  No further evidence of recurrent events.  He continues on aspirin  and Plavix  per his primary provider.  Elevated troponin:    He has been a no-show for previous stress testing but he does agree to a perfusion study and I will arrange this.     Follow up with me based on the results of the above  Signed, Lynwood Schilling, MD

## 2023-11-19 ENCOUNTER — Ambulatory Visit: Attending: Cardiology | Admitting: Cardiology

## 2023-11-19 ENCOUNTER — Ambulatory Visit

## 2023-11-19 ENCOUNTER — Encounter: Payer: Self-pay | Admitting: Cardiology

## 2023-11-19 VITALS — BP 134/88 | HR 81 | Ht 74.0 in | Wt 214.0 lb

## 2023-11-19 DIAGNOSIS — I10 Essential (primary) hypertension: Secondary | ICD-10-CM

## 2023-11-19 DIAGNOSIS — R7989 Other specified abnormal findings of blood chemistry: Secondary | ICD-10-CM

## 2023-11-19 DIAGNOSIS — R55 Syncope and collapse: Secondary | ICD-10-CM

## 2023-11-19 NOTE — Progress Notes (Unsigned)
Applied a 3 day Zio XT monitor to patient in the office 

## 2023-11-19 NOTE — Patient Instructions (Signed)
 Medication Instructions:  Your physician recommends that you continue on your current medications as directed. Please refer to the Current Medication list given to you today.  *If you need a refill on your cardiac medications before your next appointment, please call your pharmacy*  Lab Work: NONE If you have labs (blood work) drawn today and your tests are completely normal, you will receive your results only by: MyChart Message (if you have MyChart) OR A paper copy in the mail If you have any lab test that is abnormal or we need to change your treatment, we will call you to review the results.  Testing/Procedures: Lexiscan  Myoview Stress Test Your physician has requested that you have a lexiscan  myoview. For further information please visit [email or web address] Please follow instruction sheet, as given.  3 Day Zio Heart Monitor Your physician has requested that you wear a Zio heart monitor for__3_ days. This will be mailed to your home with instructions on how to apply the monitor and how to return it when finished. Please allow 2 weeks after returning the heart monitor before our office calls you with the results.   Follow-Up: At [facility], you and your health needs are our priority.  As part of our continuing mission to provide you with exceptional heart care, our providers are all part of one team.  This team includes your primary Cardiologist (physician) and Advanced Practice Providers or APPs (Physician Assistants and Nurse Practitioners) who all work together to provide you with the care you need, when you need it.  Your next appointment:   As needed  Provider:   Lavona, MD  We recommend signing up for the patient portal called MyChart.  Sign up information is provided on this After Visit Summary.  MyChart is used to connect with patients for Virtual Visits (Telemedicine).  Patients are able to view lab/test results, encounter notes, upcoming appointments, etc.   Non-urgent messages can be sent to your provider as well.   To learn more about what you can do with MyChart, go to [email or web address].   Other Instructions ZIO XT- Long Term Monitor Instructions  Your physician has requested you wear a ZIO patch monitor for 3 days.  This is a single patch monitor. [employer] supplies one patch monitor per enrollment. Additional stickers are not available. Please do not apply patch if you will be having a Nuclear Stress Test,  Echocardiogram, Cardiac CT, MRI, or Chest Xray during the period you would be wearing the  monitor. The patch cannot be worn during these tests. You cannot remove and re-apply the  ZIO XT patch monitor.  Your ZIO patch monitor will be mailed 3 day USPS to your address on file. It may take 3-5 days  to receive your monitor after you have been enrolled.  Once you have received your monitor, please review the enclosed instructions. Your monitor  has already been registered assigning a specific monitor serial # to you.  Billing and Patient Assistance Program Information  We have supplied [employer] with any of your insurance information on file for billing purposes. [employer] offers a sliding scale Patient Assistance Program for patients that do not have  insurance, or whose insurance does not completely cover the cost of the ZIO monitor.  You must apply for the Patient Assistance Program to qualify for this discounted rate.  To apply, please call [employer] at [phone], select option 4, select option 2, ask to apply for  Patient Assistance Program. Meredeth will ask your household income, and  how many people  are in your household. They will quote your out-of-pocket cost based on that information.  [employer] will also be able to set up a 18-month, interest-free payment plan if needed.  Applying the monitor   Shave hair from upper left chest.  Hold abrader disc by orange tab. Rub abrader in 40 strokes over the upper left chest as   indicated in your monitor instructions.  Clean area with 4 enclosed alcohol pads. Let dry.  Apply patch as indicated in monitor instructions. Patch will be placed under collarbone on left  side of chest with arrow pointing upward.  Rub patch adhesive wings for 2 minutes. Remove white label marked 1. Remove the white  label marked 2. Rub patch adhesive wings for 2 additional minutes.  While looking in a mirror, press and release button in center of patch. A small green light will  flash 3-4 times. This will be your only indicator that the monitor has been turned on.  Do not shower for the first 24 hours. You may shower after the first 24 hours.  Press the button if you feel a symptom. You will hear a small click. Record Date, Time and  Symptom in the Patient Logbook.  When you are ready to remove the patch, follow instructions on the last 2 pages of Patient  Logbook. Stick patch monitor onto the last page of Patient Logbook.  Place Patient Logbook in the blue and white box. Use locking tab on box and tape box closed  securely. The blue and white box has prepaid postage on it. Please place it in the mailbox as  soon as possible. Your physician should have your test results approximately 7 days after the  monitor has been mailed back to [facility].  Call [facility] Customer Care at [phone] if you have questions regarding  your ZIO XT patch monitor. Call them immediately if you see an orange light blinking on your  monitor.  If your monitor falls off in less than 4 days, contact our Monitor department at [phone].  If your monitor becomes loose or falls off after 4 days call [employer] at [phone] for  suggestions on securing your monitor

## 2023-11-20 ENCOUNTER — Telehealth (HOSPITAL_COMMUNITY): Payer: Self-pay | Admitting: *Deleted

## 2023-11-20 ENCOUNTER — Other Ambulatory Visit: Payer: Self-pay | Admitting: Cardiology

## 2023-11-20 DIAGNOSIS — R7989 Other specified abnormal findings of blood chemistry: Secondary | ICD-10-CM

## 2023-11-20 NOTE — Telephone Encounter (Signed)
 Left message on voicemail per DPR in reference to upcoming appointment scheduled on 11/25/2023 at 7:30 with detailed instructions given per Myocardial Perfusion Study Information Sheet for the test. LM to arrive 15 minutes early, and that it is imperative to arrive on time for appointment to keep from having the test rescheduled. If you need to cancel or reschedule your appointment, please call the office within 24 hours of your appointment. Failure to do so may result in a cancellation of your appointment, and a $50 no show fee. Phone number given for call back for any questions.

## 2023-11-25 ENCOUNTER — Ambulatory Visit (HOSPITAL_COMMUNITY): Admission: RE | Admit: 2023-11-25 | Source: Ambulatory Visit | Attending: Cardiology | Admitting: Cardiology

## 2023-11-25 ENCOUNTER — Telehealth (HOSPITAL_COMMUNITY): Payer: Self-pay | Admitting: Cardiology

## 2023-11-25 NOTE — Telephone Encounter (Signed)
 Patient cancelled Myoview fro reason below:  11/21/2023 3:09 PM Ab:FRWZPO, CHLOE R  Cancel Rsn: Patient (pt spouse called in stating this day does not work and pt concerned about sitting still for so long. they will hold off on scheduling for now)   Order will be removed from the echo/nuc WQ. Thank you.

## 2023-11-29 NOTE — Telephone Encounter (Addendum)
 I will update the requesting office that our preop team that has been in place for 6+ yrs has a protocol, which entails all request are implemented into a format designed for our preop APP's and MD's for consistency. We have 7 office locations and keeping everything in uniform in one format helps to be sure that our team has not missed any necessary information for the pt or the requesting office. Both your office and our practice are both in EPIC and all notes can be seen as well.   Please do let us  know that if our office did miss any information for the surgeon. However, our office prides ourself on be as accurate to the best of our abilities. We are all here to care for the pt.    I also see the pt is on Plavix  and ASA. I apologize if the team missed this if it is needed to be held. I am going to run this past our preop APP as well today to ensure if the pt has been cleared. I see the pt was ordered to have some cardiac testing as well.

## 2023-11-29 NOTE — Telephone Encounter (Signed)
 Office called to say they need the clearance form back. Please advise

## 2023-12-02 NOTE — Telephone Encounter (Signed)
 I am helping in preop today. Per chart review, patient was needing to see MD in the office for preoperative evaluation for urologic surgery before he could be granted OK to proceed. Patient saw Dr. Lavona 11/19/23 at which time he was evaluated for syncope and elevated troponin. 3-day monitor and nuclear stress test were arranged. Monitor is still awaiting finalization and the patient deferred the stress test. Preop input or antiplatelet holding was not explicitly mentioned in recent OV, so will route to Dr. Lavona for input on both of these issues given that testing is not complete and patient decided not to have stress test. Dr. Lavona - Please route response to P CV DIV PREOP (the pre-op pool). Thank you.

## 2023-12-05 DIAGNOSIS — R55 Syncope and collapse: Secondary | ICD-10-CM | POA: Diagnosis not present

## 2023-12-06 ENCOUNTER — Ambulatory Visit: Payer: Self-pay | Admitting: Cardiology

## 2023-12-09 NOTE — Telephone Encounter (Signed)
 Patient returned RN's call regarding results.

## 2023-12-11 NOTE — Telephone Encounter (Signed)
 Patient did not come to stress test appointment scheduled for 12/03/2023. Will send note to surgeon's office that patient is not cleared until stress test is completed.

## 2023-12-11 NOTE — Telephone Encounter (Signed)
   Name:  Isaac Reyes  DOB:  11/11/1950  MRN:  [id number]   Primary Cardiologist: Lynwood Schilling, MD  Chart reviewed as part of pre-operative protocol coverage. Patient was contacted 12/11/2023 in reference to pre-operative risk assessment for pending surgery as outlined below.  [name] was last seen on 11/19/2023 by Dr.Hochrein.  Since that day, [name] required a stress test for clearance per Dr. Schilling.  He did not show for stress test ordered for 12/03/2023, and has repeatedly not shown for stress tests ordered in the past. We therefore cannot clear patient from cardiology standpoint.    Lamarr Satterfield, NP 12/11/2023, 10:10 AM

## 2023-12-12 NOTE — Telephone Encounter (Signed)
 Patient with [name] at returned call regarding results.

## 2023-12-19 ENCOUNTER — Telehealth: Payer: Self-pay | Admitting: Cardiology

## 2023-12-19 NOTE — Telephone Encounter (Signed)
 I will update the requesting office to see notes from Lamarr Satterfield, [facility] 12/11/23 cannot clear pt until he completes the stress test.

## 2023-12-19 NOTE — Telephone Encounter (Signed)
 Caller (Crystal) called to follow-up on the status of patient's clearance and if he needs to do the MYOCARDIAL PERFUSION test.

## 2023-12-19 NOTE — Telephone Encounter (Signed)
 Note I will update the requesting office to see notes from Lamarr Satterfield, [facility] 12/11/23 cannot clear pt until he completes the stress test.        12/19/23 12:32 PM Tanda Needle B routed this conversation to Cv Div Preop Callback Wilson, Jasmin B JW   12/19/23 12:32 PM Note Caller (Crystal) called to follow-up on the status of patient's clearance and if he needs to do the MYOCARDIAL PERFUSION test.       12/19/23 12:30

## 2023-12-27 ENCOUNTER — Encounter (HOSPITAL_COMMUNITY): Payer: Self-pay | Admitting: *Deleted

## 2024-01-06 ENCOUNTER — Ambulatory Visit (HOSPITAL_COMMUNITY)
Admission: RE | Admit: 2024-01-06 | Discharge: 2024-01-06 | Disposition: A | Discharge status: Home / Self Care | Source: Ambulatory Visit | Attending: Cardiology | Admitting: Cardiology

## 2024-01-06 DIAGNOSIS — R55 Syncope and collapse: Secondary | ICD-10-CM | POA: Insufficient documentation

## 2024-01-06 DIAGNOSIS — F17210 Nicotine dependence, cigarettes, uncomplicated: Secondary | ICD-10-CM | POA: Diagnosis not present

## 2024-01-06 DIAGNOSIS — Z136 Encounter for screening for cardiovascular disorders: Secondary | ICD-10-CM | POA: Diagnosis not present

## 2024-01-06 DIAGNOSIS — I700 Atherosclerosis of aorta: Secondary | ICD-10-CM | POA: Insufficient documentation

## 2024-01-06 DIAGNOSIS — R7989 Other specified abnormal findings of blood chemistry: Secondary | ICD-10-CM | POA: Insufficient documentation

## 2024-01-06 DIAGNOSIS — I10 Essential (primary) hypertension: Secondary | ICD-10-CM | POA: Insufficient documentation

## 2024-01-06 LAB — MYOCARDIAL PERFUSION IMAGING
LV dias vol: 159 mL (ref 62–150)
LV sys vol: 86 mL (ref 4.2–5.8)
Nuc Stress EF: 46 %
Peak HR: 86 {beats}/min
Rest HR: 65 {beats}/min
Rest Nuclear Isotope Dose: 10.2 mCi
SDS: 4
SRS: 2
SSS: 7
ST Depression (mm): 0 mm
Stress Nuclear Isotope Dose: 32.7 mCi
TID: 1.21

## 2024-01-06 MED ORDER — TECHNETIUM TC 99M TETROFOSMIN IV KIT
10.2000 | PACK | Freq: Once | INTRAVENOUS | Status: AC | PRN
  Administered 2024-01-06: 10.2 via INTRAVENOUS

## 2024-01-06 MED ORDER — REGADENOSON 0.4 MG/5ML IV SOLN
INTRAVENOUS | Status: AC
  Filled 2024-01-06: qty 5

## 2024-01-06 MED ORDER — REGADENOSON 0.4 MG/5ML IV SOLN
0.4000 mg | Freq: Once | INTRAVENOUS | Status: AC
  Administered 2024-01-06: 0.4 mg via INTRAVENOUS

## 2024-01-06 MED ORDER — TECHNETIUM TC 99M TETROFOSMIN IV KIT
32.7000 | PACK | Freq: Once | INTRAVENOUS | Status: AC | PRN
  Administered 2024-01-06: 32.7 via INTRAVENOUS

## 2024-01-08 NOTE — Telephone Encounter (Signed)
 I will forward to surgeon office the pt has been cleared.

## 2024-01-08 NOTE — Telephone Encounter (Signed)
 I will forward to preop APP to review if pt has been cleared. Pt was needing stress test which was completed 01/06/24.

## 2024-01-08 NOTE — Telephone Encounter (Signed)
 Office calling to f/u on Clearance being that pt is scheduled for procedure on 8/29. Please advise

## 2024-01-08 NOTE — Telephone Encounter (Signed)
   Patient Name: Isaac Reyes  DOB: [birth year] MRN: [id number]  Primary Cardiologist: Lynwood Schilling, MD  Chart reviewed as part of pre-operative protocol coverage. Given past medical history and time since last visit, based on ACC/AHA guidelines, DEMICO PLOCH is at acceptable risk for the planned procedure without further cardiovascular testing.   There was a questionable small area of ischemia but this was not high risk. However, the EF was reported to mildly reduced. This was not the case on echo in October 2024. Given the absence of new symptoms and low risk with previous normal echo I do not think further testing is indicated. I would like to have him come back in 6 months with an APP to make sure he still has no symptoms.   Plavix  will need to be address by PCP Leontine Cramp, NP as they are providing Rx for hx of CVA.   The patient was advised that if he develops new symptoms prior to surgery to contact our office to arrange for a follow-up visit, and he verbalized understanding.  I will route this recommendation to the requesting party via Epic fax function and remove from pre-op pool.  Please call with questions.  Lamarr Satterfield, NP 01/08/2024, 10:20 AM

## 2024-01-09 ENCOUNTER — Other Ambulatory Visit: Payer: Self-pay | Admitting: Urology

## 2024-01-10 ENCOUNTER — Telehealth: Payer: Self-pay | Admitting: Cardiology

## 2024-01-10 NOTE — Telephone Encounter (Signed)
 Patient returned RN's call regarding test results.  Patient noted can also call Crystal at [phone] if can't reach him at [phone].

## 2024-01-10 NOTE — Telephone Encounter (Signed)
 Called patient to give results over phone.   There was a questionable small area of ischemia but this was not high risk. However, the EF was reported to mildly reduced. This was not the case on echo in October 2024. Given the absence of new symptoms and low risk with previous normal echo I do not think further testing is indicated. I would like to have him come back in 6 months with an APP to make sure he still has no symptoms.   Patient verbalized understanding. Patient asked if this is considered clearance to procedure, I advised patient that the office that scheduled procedure usually requests a surgical clearance and we will contact them regarding clearance. Patient verbalized understanding.   Josie RN

## 2024-01-12 ENCOUNTER — Ambulatory Visit: Payer: Self-pay | Admitting: Cardiology

## 2024-01-13 NOTE — H&P (Signed)
 01/08/2024: Patient here today for preoperative evaluation prior to undergoing fiducial marker and SpaceOAR placement with Dr. Watt on 8/29 in anticipation of starting XRT under the guidance of radiation oncology shortly thereafter for treatment of his underlying prostate cancer diagnosis. Patient has already initiated ADT with Eligard, this was administered in April of this year. He denies any changes in past medical history or prescription medications taken on a daily basis. He remains on Plavix , we are awaiting cardiac clearance. Cardiology required a stress test to be performed prior to clearing him and that was just completed on 8/18. We are awaiting their review and clearance decision through time of this morning's appointment. Patient denies any recent dysuria or gross hematuria. No recent treatment for UTI or other infectious process. At baseline he has urinary frequency/urgency which can be intermittently problematic. He also has nocturia times many but that also seems to occur intermittently, mostly fluid intake dependent.   He has tolerated ADT well. Denies any excessive fatigue or hot flashes. He denies any recent chest pain or shortness of breath. No recent fever/chills or nausea/vomiting     ALLERGIES: No Allergies    MEDICATIONS: amLODIPine  Bes+SyrSpend SF 1 tablet PO Daily  Atorvastatin  Calcium      GU PSH: Prostate Needle Biopsy - 06/24/2023       PSH Notes: Knee Surgery   NON-GU PSH: Surgical Pathology, Gross And Microscopic Examination For Prostate Needle - 06/24/2023 Visit Complexity (formerly GPC1X) - 09/02/2023     GU PMH: Prostate Cancer - 09/09/2023, - 09/02/2023, - 08/13/2023, - 07/04/2023 Elevated PSA - 09/02/2023, - 07/04/2023, - 06/24/2023, - 05/06/2023, Elevated prostate specific antigen (PSA), - 2017 BPH w/LUTS, Benign localized hyperplasia of prostate with urinary obstruction - 2017 Nocturia, Nocturia - 2017      PMH Notes:  2012-07-14 14:51:34 - Note: Acute Cutaneous  Lupus Erythematosus   NON-GU PMH: Encounter for general adult medical examination without abnormal findings, Encounter for preventive health examination - 2017 Asthma, Asthma - 2014 Depression Hypertension Stroke/TIA    FAMILY HISTORY: 1 Daughter - Runs in Family 1 son - Runs in Family Acute Myocardial Infarction - Father Death - Mother Diabetes - Father Hypertension - Mother, Father Kidney Failure - Father renal failure - Father   SOCIAL HISTORY: Marital Status: Divorced Preferred Language: English; Ethnicity: Not Hispanic Or Latino; Race: Black or African American Current Smoking Status: Patient smokes. Has smoked since 04/21/1983. Smokes less than 1/2 pack per day.   Tobacco Use Assessment Completed: Used Tobacco in last 30 days? Does not use smokeless tobacco. Does not use drugs. Does not drink caffeine. Has not had a blood transfusion.     Notes: Former smoker, Alcohol Use, Caffeine Use, Marital History - Divorced, Occupation:   REVIEW OF SYSTEMS:    GU Review Male:   Patient reports frequent urination, hard to postpone urination, and get up at night to urinate. Patient denies burning/ pain with urination, leakage of urine, stream starts and stops, trouble starting your stream, have to strain to urinate , erection problems, and penile pain.  Gastrointestinal (Upper):   Patient denies nausea, vomiting, and indigestion/ heartburn.  Gastrointestinal (Lower):   Patient denies diarrhea and constipation.  Constitutional:   Patient denies fever, night sweats, weight loss, and fatigue.  Skin:   Patient denies skin rash/ lesion and itching.  Eyes:   Patient denies blurred vision and double vision.  Ears/ Nose/ Throat:   Patient denies sore throat and sinus problems.  Hematologic/Lymphatic:   Patient denies swollen  glands and easy bruising.  Cardiovascular:   Patient denies leg swelling and chest pains.  Respiratory:   Patient denies cough and shortness of breath.  Endocrine:    Patient denies excessive thirst.  Musculoskeletal:   Patient denies back pain and joint pain.  Neurological:   Patient denies headaches and dizziness.  Psychologic:   Patient denies depression and anxiety.   VITAL SIGNS:      01/08/2024 11:38 AM  Weight 220 lb / 99.79 kg  Height 7 in / 17.78 cm  BP 133/79 mmHg  Pulse 76 /min  BMI 3,156.3 kg/m   MULTI-SYSTEM PHYSICAL EXAMINATION:    Constitutional: Well-nourished. No physical deformities. Normally developed. Good grooming.  Neck: Neck symmetrical, not swollen. Normal tracheal position.  Respiratory: No labored breathing, no use of accessory muscles.   Cardiovascular: Normal temperature, normal extremity pulses, no swelling, no varicosities.  Skin: No paleness, no jaundice, no cyanosis. No lesion, no ulcer, no rash.  Neurologic / Psychiatric: Oriented to time, oriented to place, oriented to person. No depression, no anxiety, no agitation.  Gastrointestinal: No mass, no tenderness, no rigidity, non obese abdomen.  Musculoskeletal: Normal gait and station of head and neck.     Complexity of Data:  Source Of History:  Patient, Family/Caregiver, Medical Record Summary  Lab Test Review:   PSA  Records Review:   Pathology Reports, Previous Doctor Records, Previous Hospital Records, Previous Patient Records  X-Ray Review: PET- PSMA Scan: Reviewed Report.     07/19/16 07/07/15 07/29/13 07/23/12  PSA  Total PSA 4.48 ng/dl 5.94  7.28  7.63   Free PSA 0.60 ng/dl 9.34     % Free PSA 13 % 16       PROCEDURES:          Visit Complexity - G2211    ASSESSMENT:      ICD-10 Details  1 GU:   Prostate Cancer - C61 Chronic, Threat to Bodily Function  2   BPH w/LUTS - N40.1 Chronic, Stable  3   Urinary Frequency - R35.0 Chronic, Stable  4   Nocturia - R35.1 Chronic, Stable  5 NON-GU:   Encounter for other preprocedural examination - Z01.818 Undiagnosed New Problem   PLAN:           Orders Labs Urine Culture, Total Testosterone, PSA           Schedule Return Visit/Planned Activity: Keep Scheduled Appointment - Follow up MD, Schedule Surgery          Document Letter(s):  Created for Patient: Clinical Summary         Notes:   We are awaiting clearance from cardiology following his stress test from earlier this week. Patient is aware that this may delay his upcoming procedure.   He has tolerated ADT well so far, ongoing updated PSA and testosterone labs today. Given the fact he does endorse some underlying urinary frequency and nocturia, I consider placing him on tamsulosin  to help augment the symptoms as well as prevent worsening symptomology when he starts radiation treatment but upon further review in epic he had a prior syncopal episode with tamsulosin  last year so I will defer alpha-blocker therapy at this time. Behavioral and fluid modification strategies discussed in detail in an effort to augment his LUTS. Appropriate return to clinic instructions also discussed for any worsening/poorly controlled symptoms as he undergoes XRT.   He will stop by the lab before leaving to leave a voided urine specimen so that a baseline culture/sensitivity  can be sent.   All questions answered to the best of my ability regarding the upcoming procedure and expected postoperative course with understanding expressed by the patient and family member present today. Pending cardiology clearance, he will keep his previously scheduled procedure date to undergo fiducial marker placement and SpaceOAR on 8/29.        Next Appointment:      Next Appointment: 01/17/2024 08:30 AM    Appointment Type: Surgery     Location: [facility] [phone]    Provider: Norleen Seltzer, M.D.    Reason for Visit: MC/OP FIDUCIAL MARKERS AND SPACE OAR

## 2024-01-16 ENCOUNTER — Encounter (HOSPITAL_COMMUNITY): Payer: Self-pay | Admitting: Urology

## 2024-01-16 NOTE — Progress Notes (Addendum)
 Addendum:  Chart reviewed w/ anesthesia, Dr Keneth MDA, okay to proceed ask patient about drug use and MDA before doing urine drug screen. Received pt's pcp plavix  clearance via fax , printed copy for chart.  Spoke w/ via phone for pre-op interview--- pt Lab needs dos----   cbc/ bmp/ urine drug screen      Lab results------ current EKG in epic/ chart COVID test -----patient states asymptomatic no test needed Arrive at -------  0630 on 01-16-224 NPO after MN w/ exception sips of water w/ meds Pre-Surgery Ensure or G2:  n/a  Med rec completed Medications to take morning of surgery ----- norvasc  Diabetic medication ----- n/a  GLP1 agonist last dose: n/a GLP1 instructions:  Patient instructed no nail polish to be worn day of surgery Patient instructed to bring photo id and insurance card day of surgery Patient aware to have Driver (ride ) / caregiver    for 24 hours after surgery - friend, crystal williams ,  whom is also his personal caregiver  Patient Special Instructions ----- will do soap shower morning of surgery and do fleet enema night before surgery Pre-Op special Instructions ----- pt has cardiac clearance by Dr Debbrah Lamarr Braun NP dated 01-08-2024 in epic/ chart Called and spoke w/ Zona, OR scheduler , requested clearance for plavix  from pt's pcp, she will fax it.  Patient verbalized understanding of instructions that were given at this phone interview. Patient denies chest pain, sob, fever, cough at the interview.    Anesthesia Review:  HTN;  hx multiple syncope's;  hx CVA's w/ residual deficit's;  CKD 3  PCP:  Luke Miyamoto NP Cardiologist :  Dr Lavona [facility] 11-19-2023)  Chest x-ray : 02-18-2023 EKG :  11-19-2023 Echo : 02-19-2023 Stress test:  [facility] 12-27-2023 Cardiac Cath :  no  Activity level:   gets around the house without sob but unable to do stairs / long distance due to stroke deficit (right side weakness) Sleep Study/ CPAP :  no Fasting Blood Sugar :       / Checks Blood Sugar -- times a day:  no  Blood Thinner/ Instructions /Last Dose:  Plavix  ASA / Instructions/ Last Dose :  ASA 81 These were not address for cardiac clearance due to pt being on them for stroke prevention.  Pt has continued his ASA but stated had stopped his plavix  last week on his own due to knowing he had surgery this week ,  stated he was not given instructions from anyone. However,  pt Zona OR scheduler stated she had given patient instructions to stop his plavix  week prior to his surgery.

## 2024-01-17 ENCOUNTER — Telehealth: Payer: Self-pay | Admitting: *Deleted

## 2024-01-17 ENCOUNTER — Ambulatory Visit (HOSPITAL_COMMUNITY): Admitting: Anesthesiology

## 2024-01-17 ENCOUNTER — Encounter (HOSPITAL_COMMUNITY): Payer: Self-pay | Admitting: Urology

## 2024-01-17 ENCOUNTER — Encounter (HOSPITAL_COMMUNITY)
Admission: RE | Disposition: A | Discharge status: Home / Self Care | Payer: Self-pay | Source: Home / Self Care | Attending: Urology

## 2024-01-17 ENCOUNTER — Ambulatory Visit (HOSPITAL_COMMUNITY)
Admission: RE | Admit: 2024-01-17 | Discharge: 2024-01-17 | Disposition: A | Discharge status: Home / Self Care | Attending: Urology | Admitting: Urology

## 2024-01-17 DIAGNOSIS — R350 Frequency of micturition: Secondary | ICD-10-CM | POA: Diagnosis not present

## 2024-01-17 DIAGNOSIS — N401 Enlarged prostate with lower urinary tract symptoms: Secondary | ICD-10-CM | POA: Diagnosis not present

## 2024-01-17 DIAGNOSIS — Z87891 Personal history of nicotine dependence: Secondary | ICD-10-CM | POA: Insufficient documentation

## 2024-01-17 DIAGNOSIS — I10 Essential (primary) hypertension: Secondary | ICD-10-CM | POA: Diagnosis not present

## 2024-01-17 DIAGNOSIS — F1411 Cocaine abuse, in remission: Secondary | ICD-10-CM | POA: Insufficient documentation

## 2024-01-17 DIAGNOSIS — Z8249 Family history of ischemic heart disease and other diseases of the circulatory system: Secondary | ICD-10-CM | POA: Diagnosis not present

## 2024-01-17 DIAGNOSIS — F32A Depression, unspecified: Secondary | ICD-10-CM | POA: Diagnosis not present

## 2024-01-17 DIAGNOSIS — Z7902 Long term (current) use of antithrombotics/antiplatelets: Secondary | ICD-10-CM | POA: Diagnosis not present

## 2024-01-17 DIAGNOSIS — C61 Malignant neoplasm of prostate: Secondary | ICD-10-CM

## 2024-01-17 DIAGNOSIS — N289 Disorder of kidney and ureter, unspecified: Secondary | ICD-10-CM | POA: Diagnosis not present

## 2024-01-17 DIAGNOSIS — R351 Nocturia: Secondary | ICD-10-CM | POA: Insufficient documentation

## 2024-01-17 DIAGNOSIS — Z8673 Personal history of transient ischemic attack (TIA), and cerebral infarction without residual deficits: Secondary | ICD-10-CM | POA: Insufficient documentation

## 2024-01-17 DIAGNOSIS — Z01818 Encounter for other preprocedural examination: Secondary | ICD-10-CM

## 2024-01-17 HISTORY — PX: SPACE OAR INSTILLATION: SHX6769

## 2024-01-17 HISTORY — DX: Unspecified abnormalities of gait and mobility: R26.9

## 2024-01-17 HISTORY — DX: Personal history of transient ischemic attack (TIA), and cerebral infarction without residual deficits: Z86.73

## 2024-01-17 HISTORY — DX: Slurred speech: R47.81

## 2024-01-17 HISTORY — DX: Alcohol abuse, uncomplicated: F10.10

## 2024-01-17 HISTORY — DX: Benign prostatic hyperplasia with lower urinary tract symptoms: N40.1

## 2024-01-17 HISTORY — PX: GOLD SEED IMPLANT: SHX6343

## 2024-01-17 HISTORY — DX: Cocaine abuse, uncomplicated: F14.10

## 2024-01-17 HISTORY — DX: Heart disease, unspecified: I51.9

## 2024-01-17 HISTORY — DX: Other local lupus erythematosus: L93.2

## 2024-01-17 HISTORY — DX: Hyperlipidemia, unspecified: E78.5

## 2024-01-17 HISTORY — DX: Personal history of other specified conditions: Z87.898

## 2024-01-17 LAB — RAPID URINE DRUG SCREEN, HOSP PERFORMED
Amphetamines: NOT DETECTED
Barbiturates: NOT DETECTED
Benzodiazepines: NOT DETECTED
Cocaine: NOT DETECTED
Opiates: NOT DETECTED
Tetrahydrocannabinol: NOT DETECTED

## 2024-01-17 LAB — CBC
HCT: 42.6 % (ref 39.0–52.0)
Hemoglobin: 13.3 g/dL (ref 13.0–17.0)
MCH: 25.9 pg — ABNORMAL LOW (ref 26.0–34.0)
MCHC: 31.2 g/dL (ref 30.0–36.0)
MCV: 82.9 fL (ref 80.0–100.0)
Platelets: 176 K/uL (ref 150–400)
RBC: 5.14 MIL/uL (ref 4.22–5.81)
RDW: 15.7 % — ABNORMAL HIGH (ref 11.5–15.5)
WBC: 4.3 K/uL (ref 4.0–10.5)
nRBC: 0 % (ref 0.0–0.2)

## 2024-01-17 LAB — BASIC METABOLIC PANEL WITH GFR
Anion gap: 9 (ref 5–15)
BUN: 23 mg/dL (ref 8–23)
CO2: 25 mmol/L (ref 22–32)
Calcium: 9.1 mg/dL (ref 8.9–10.3)
Chloride: 106 mmol/L (ref 98–111)
Creatinine, Ser: 1.71 mg/dL — ABNORMAL HIGH (ref 0.61–1.24)
GFR, Estimated: 42 mL/min — ABNORMAL LOW (ref >60)
Glucose, Bld: 93 mg/dL (ref 70–99)
Potassium: 3.8 mmol/L (ref 3.5–5.1)
Sodium: 140 mmol/L (ref 135–145)

## 2024-01-17 SURGERY — INSERTION, GOLD SEEDS
Anesthesia: Monitor Anesthesia Care

## 2024-01-17 MED ORDER — PROPOFOL 10 MG/ML IV BOLUS
INTRAVENOUS | Status: DC | PRN
  Administered 2024-01-17: 150 mg via INTRAVENOUS

## 2024-01-17 MED ORDER — OXYCODONE HCL 5 MG PO TABS: 5.0000 mg | ORAL_TABLET | ORAL | Status: DC | PRN

## 2024-01-17 MED ORDER — SODIUM CHLORIDE 0.9 % IV SOLN: INTRAVENOUS | Status: DC

## 2024-01-17 MED ORDER — ACETAMINOPHEN 325 MG PO TABS: 650.0000 mg | ORAL_TABLET | ORAL | Status: DC | PRN

## 2024-01-17 MED ORDER — LIDOCAINE HCL 2 % IJ SOLN
INTRAMUSCULAR | Status: DC | PRN
  Administered 2024-01-17: 8 mL

## 2024-01-17 MED ORDER — SODIUM CHLORIDE (PF) 0.9 % IJ SOLN
INTRAMUSCULAR | Status: DC | PRN
  Administered 2024-01-17: 10 mL via INTRAVENOUS

## 2024-01-17 MED ORDER — SODIUM CHLORIDE 0.9% FLUSH: 3.0000 mL | Freq: Two times a day (BID) | INTRAVENOUS | Status: DC

## 2024-01-17 MED ORDER — ACETAMINOPHEN 650 MG RE SUPP
650.0000 mg | RECTAL | Status: DC | PRN
Start: 2024-01-17 — End: 2024-01-17

## 2024-01-17 MED ORDER — CHLORHEXIDINE GLUCONATE 0.12 % MT SOLN
15.0000 mL | Freq: Once | OROMUCOSAL | Status: AC
  Administered 2024-01-17: 15 mL via OROMUCOSAL

## 2024-01-17 MED ORDER — ONDANSETRON HCL 4 MG/2ML IJ SOLN
INTRAMUSCULAR | Status: AC
Start: 2024-01-17 — End: 2024-01-17
  Filled 2024-01-17: qty 2

## 2024-01-17 MED ORDER — ONDANSETRON HCL 4 MG/2ML IJ SOLN
INTRAMUSCULAR | Status: DC | PRN
  Administered 2024-01-17: 4 mg via INTRAVENOUS

## 2024-01-17 MED ORDER — CEFAZOLIN SODIUM-DEXTROSE 2-4 GM/100ML-% IV SOLN
2.0000 g | INTRAVENOUS | Status: AC
  Administered 2024-01-17: 2 g via INTRAVENOUS

## 2024-01-17 MED ORDER — ORAL CARE MOUTH RINSE: 15.0000 mL | Freq: Once | OROMUCOSAL | Status: AC

## 2024-01-17 MED ORDER — SODIUM CHLORIDE (PF) 0.9 % IJ SOLN
INTRAMUSCULAR | Status: AC
  Filled 2024-01-17: qty 40

## 2024-01-17 MED ORDER — EPHEDRINE SULFATE-NACL 50-0.9 MG/10ML-% IV SOSY
PREFILLED_SYRINGE | INTRAVENOUS | Status: DC | PRN
  Administered 2024-01-17: 5 mg via INTRAVENOUS

## 2024-01-17 MED ORDER — LIDOCAINE HCL 2 % IJ SOLN
INTRAMUSCULAR | Status: AC
  Filled 2024-01-17: qty 80

## 2024-01-17 MED ORDER — DEXAMETHASONE SODIUM PHOSPHATE 10 MG/ML IJ SOLN
INTRAMUSCULAR | Status: AC
  Filled 2024-01-17: qty 1

## 2024-01-17 MED ORDER — SODIUM CHLORIDE 0.9% FLUSH: 3.0000 mL | INTRAVENOUS | Status: DC | PRN

## 2024-01-17 MED ORDER — MORPHINE SULFATE (PF) 2 MG/ML IV SOLN: 2.0000 mg | INTRAVENOUS | Status: DC | PRN

## 2024-01-17 MED ORDER — CHLORHEXIDINE GLUCONATE 0.12 % MT SOLN
OROMUCOSAL | Status: AC
  Filled 2024-01-17: qty 15

## 2024-01-17 MED ORDER — SODIUM CHLORIDE 0.9 % IV SOLN: 250.0000 mL | INTRAVENOUS | Status: DC | PRN

## 2024-01-17 MED ORDER — LACTATED RINGERS IV SOLN: INTRAVENOUS | Status: DC

## 2024-01-17 MED ORDER — DEXAMETHASONE SODIUM PHOSPHATE 10 MG/ML IJ SOLN
INTRAMUSCULAR | Status: DC | PRN
  Administered 2024-01-17: 10 mg via INTRAVENOUS

## 2024-01-17 MED ORDER — CEFAZOLIN SODIUM-DEXTROSE 2-4 GM/100ML-% IV SOLN
INTRAVENOUS | Status: AC
  Filled 2024-01-17: qty 100

## 2024-01-17 SURGICAL SUPPLY — 19 items
BLADE CLIPPER SENSICLIP SURGIC (BLADE) ×1 IMPLANT
CNTNR URN SCR LID CUP LEK RST (MISCELLANEOUS) ×1 IMPLANT
COVER BACK TABLE 60X90IN (DRAPES) ×1 IMPLANT
DRSG TEGADERM 4X4.75 (GAUZE/BANDAGES/DRESSINGS) ×1 IMPLANT
DRSG TEGADERM 8X12 (GAUZE/BANDAGES/DRESSINGS) ×1 IMPLANT
GAUZE SPONGE 4X4 12PLY STRL (GAUZE/BANDAGES/DRESSINGS) ×1 IMPLANT
GLOVE SURG ORTHO 8.5 STRL (GLOVE) ×1 IMPLANT
GLOVE SURG SS PI 8.0 STRL IVOR (GLOVE) ×1 IMPLANT
IMPL SPACEOAR SYSTEM 10ML (Spacer) ×1 IMPLANT
MARKER GOLD PRELOAD 1.2X3 (Urological Implant) ×1 IMPLANT
MARKER SKIN DUAL TIP RULER LAB (MISCELLANEOUS) ×1 IMPLANT
NDL SPNL 22GX3.5 QUINCKE BK (NEEDLE) ×1 IMPLANT
NEEDLE SPNL 22GX3.5 QUINCKE BK (NEEDLE) ×1 IMPLANT
SHEATH ULTRASOUND LTX NONSTRL (SHEATH) IMPLANT
SLEEVE SCD COMPRESS KNEE MED (STOCKING) ×1 IMPLANT
SURGILUBE 2OZ TUBE FLIPTOP (MISCELLANEOUS) ×1 IMPLANT
SYR 10ML LL (SYRINGE) IMPLANT
SYR CONTROL 10ML LL (SYRINGE) ×1 IMPLANT
UNDERPAD 30X36 HEAVY ABSORB (UNDERPADS AND DIAPERS) ×1 IMPLANT

## 2024-01-17 NOTE — Op Note (Signed)
 Procedure: 1.  Transrectal ultrasound-guided placement of fiducial markers. 2.  Transrectal ultrasound-guided placement of SpaceOAR gel.   Preop diagnosis: Prostate cancer.   Postop diagnosis: Same.   Surgeon: Dr. Norleen Seltzer.   Anesthesia: LMA.   Specimen: None   EBL: None.   Drain: None.   Complications: None. . Indications: The patient is a 73 year old male with history of prostate cancer he is to undergo radiation therapy and in preparation is to have fiducial markers and SpaceOAR gel placed.   Procedure: He was given ancef  for antibiotic coverage.  He was taken the operating room was placed in lithotomy position and given sedation as needed.  His perineum was clipped and the scrotum was reflected superiorly with an OpSite.  Perineum was prepped with Betadine solution.   The B and K transrectal ultrasound probe was assembled and inserted after generous lubrication.  The prostate was visualized in the sagittal and transverse planes.   The mid perineum was infiltrated with 8mL of 2% lidocaine  from the skin level down to the prostatic apex.   The fiducial markers were then placed under ultrasound guidance into the right base medial prostate, the right apex and the left mid lateral prostate.   The SpaceOAR needle was then placed under ultrasound guidance into the post prostatic fat strip and advanced to the base of the prostate.  Saline was infiltrated and the space expanded appropriately.  No blood was returned with aspiration.   The SpaceOAR  gel polymer was then instilled over a 10-second.  With excellent distribution underneath the prostate and no evidence of rectal infiltration.  The needle was then removed.   The ultrasound probe was removed and he was taken down from the lithotomy position and moved recovery room in stable condition after the wound was cleansed and a dressing was placed.

## 2024-01-17 NOTE — Transfer of Care (Signed)
 Immediate Anesthesia Transfer of Care Note  Patient: Isaac Reyes [facility]  Procedure(s) Performed: INSERTION, GOLD SEEDS INJECTION, HYDROGEL SPACER  Patient Location: PACU  Anesthesia Type:General  Level of Consciousness: awake, alert , oriented, and patient cooperative  Airway & Oxygen Therapy: Patient Spontanous Breathing  Post-op Assessment: Report given to RN, Post -op Vital signs reviewed and stable, Patient moving all extremities X 4, and Patient able to stick tongue midline  Post vital signs: Reviewed and stable  Last Vitals:  Vitals Value Taken Time  BP 131/81 01/17/24 09:05  Temp 37 C 01/17/24 09:06  Pulse 68   Resp 16 01/17/24 09:06  SpO2 100   Vitals shown include unfiled device data.  Last Pain:  Vitals:   01/17/24 0644  TempSrc: Oral  PainSc: 0-No pain      Patients Stated Pain Goal: 2 (01/17/24 9355)  Complications: No notable events documented.

## 2024-01-17 NOTE — Anesthesia Postprocedure Evaluation (Signed)
 Anesthesia Post Note  Patient: Isaac Reyes  Procedure(s) Performed: INSERTION, GOLD SEEDS INJECTION, HYDROGEL SPACER     Patient location during evaluation: PACU Anesthesia Type: MAC Level of consciousness: awake and alert Pain management: pain level controlled Vital Signs Assessment: post-procedure vital signs reviewed and stable Respiratory status: spontaneous breathing, nonlabored ventilation, respiratory function stable and patient connected to nasal cannula oxygen Cardiovascular status: blood pressure returned to baseline and stable Postop Assessment: no apparent nausea or vomiting Anesthetic complications: no   No notable events documented.  Last Vitals:  Vitals:   01/17/24 0930 01/17/24 0935  BP:  (!) 144/92  Pulse: 74 70  Resp: (!) 21 (!) 21  Temp:    SpO2: 94% 97%    Last Pain:  Vitals:   01/17/24 0935  TempSrc:   PainSc: 0-No pain                 Lynwood MARLA Cornea

## 2024-01-17 NOTE — Anesthesia Procedure Notes (Deleted)
 Procedure Name: LMA Insertion Date/Time: 01/17/2024 8:34 AM  Performed by: Viviana Almarie DASEN, CRNAPre-anesthesia Checklist: Patient identified, Emergency Drugs available, Suction available and Patient being monitored Patient Re-evaluated:Patient Re-evaluated prior to induction Oxygen Delivery Method: Circle System Utilized Preoxygenation: Pre-oxygenation with 100% oxygen Induction Type: IV induction Ventilation: Mask ventilation without difficulty LMA: LMA inserted LMA Size: 5.0 Number of attempts: 1 Airway Equipment and Method: Bite block Placement Confirmation: positive ETCO2 Tube secured with: Tape Dental Injury: Teeth and Oropharynx as per pre-operative assessment

## 2024-01-17 NOTE — Telephone Encounter (Signed)
 CALLED PATIENT TO REMIND OF MRI AND SIM APPT. FOR 01-21-24, LVM FOR A RETURN CALL

## 2024-01-17 NOTE — Interval H&P Note (Signed)
 History and Physical Interval Note:  01/17/2024 7:08 AM  Isaac Reyes Isaac Reyes  has presented today for surgery, with the diagnosis of PROSTATE CANCER.  The various methods of treatment have been discussed with the patient and family. After consideration of risks, benefits and other options for treatment, the patient has consented to  Procedure(s): INSERTION, GOLD SEEDS (N/A) INJECTION, HYDROGEL SPACER (N/A) as a surgical intervention.  The patient's history has been reviewed, patient examined, no change in status, stable for surgery.  I have reviewed the patient's chart and labs.  Questions were answered to the patient's satisfaction.     Shylee Durrett

## 2024-01-17 NOTE — Anesthesia Preprocedure Evaluation (Addendum)
 Anesthesia Evaluation  Patient identified by MRN, date of birth, ID band Patient awake    Reviewed: Allergy & Precautions, NPO status , Patient's Chart, lab work & pertinent test results, reviewed documented beta blocker date and time   History of Anesthesia Complications Negative for: history of anesthetic complications  Airway Mallampati: III  TM Distance: >3 FB     Dental no notable dental hx.    Pulmonary neg COPD, neg recent URI, former smoker   breath sounds clear to auscultation       Cardiovascular hypertension, (-) angina (-) CAD and (-) Past MI + Valvular Problems/Murmurs  Rhythm:Regular Rate:Normal     Neuro/Psych neg Seizures PSYCHIATRIC DISORDERS  Depression    CVA, Residual Symptoms    GI/Hepatic ,neg GERD  ,,(+) neg Cirrhosis    substance abuse  alcohol use and cocaine use  Endo/Other    Renal/GU Renal disease     Musculoskeletal   Abdominal   Peds  Hematology  (+) Blood dyscrasia, anemia   Anesthesia Other Findings   Reproductive/Obstetrics                              Anesthesia Physical Anesthesia Plan  ASA: 3  Anesthesia Plan: General   Post-op Pain Management:    Induction: Intravenous  PONV Risk Score and Plan: 1 and Ondansetron  and Propofol  infusion  Airway Management Planned: LMA  Additional Equipment:   Intra-op Plan:   Post-operative Plan: Extubation in OR  Informed Consent: I have reviewed the patients History and Physical, chart, labs and discussed the procedure including the risks, benefits and alternatives for the proposed anesthesia with the patient or authorized representative who has indicated his/her understanding and acceptance.     Dental advisory given  Plan Discussed with: CRNA  Anesthesia Plan Comments:          Anesthesia Quick Evaluation

## 2024-01-17 NOTE — Discharge Instructions (Signed)

## 2024-01-17 NOTE — Anesthesia Procedure Notes (Signed)
 Procedure Name: LMA Insertion Date/Time: 01/17/2024 8:34 AM  Performed by: Viviana Almarie DASEN, CRNAPre-anesthesia Checklist: Patient identified, Emergency Drugs available, Suction available and Patient being monitored Patient Re-evaluated:Patient Re-evaluated prior to induction Oxygen Delivery Method: Circle System Utilized Preoxygenation: Pre-oxygenation with 100% oxygen Induction Type: IV induction Ventilation: Mask ventilation without difficulty LMA: LMA inserted LMA Size: 4.0 Tube type: Oral Number of attempts: 1 Airway Equipment and Method: Bite block Placement Confirmation: positive ETCO2 Tube secured with: Tape Dental Injury: Teeth and Oropharynx as per pre-operative assessment

## 2024-01-21 ENCOUNTER — Encounter (HOSPITAL_COMMUNITY): Payer: Self-pay | Admitting: Urology

## 2024-01-21 ENCOUNTER — Other Ambulatory Visit: Payer: Self-pay | Admitting: Radiation Oncology

## 2024-01-21 ENCOUNTER — Ambulatory Visit: Admitting: Radiation Oncology

## 2024-01-21 ENCOUNTER — Ambulatory Visit (HOSPITAL_COMMUNITY): Admission: RE | Admit: 2024-01-21 | Source: Ambulatory Visit

## 2024-01-21 ENCOUNTER — Telehealth: Payer: Self-pay | Admitting: *Deleted

## 2024-01-21 DIAGNOSIS — Z510 Encounter for antineoplastic radiation therapy: Secondary | ICD-10-CM | POA: Insufficient documentation

## 2024-01-21 DIAGNOSIS — C61 Malignant neoplasm of prostate: Secondary | ICD-10-CM | POA: Insufficient documentation

## 2024-01-21 NOTE — Progress Notes (Incomplete)
  Radiation Oncology         [phone] ________________________________  Name: Isaac Reyes MRN: [id number]  Date: 01/21/2024  DOB: August 04, 1950  SIMULATION AND TREATMENT PLANNING NOTE    ICD-10-CM   1. Malignant neoplasm of prostate (HCC)  C61       DIAGNOSIS:  73 y.o. 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  NARRATIVE:  The patient was brought to the CT Simulation planning suite.  Identity was confirmed.  All relevant records and images related to the planned course of therapy were reviewed.  The patient freely provided informed written consent to proceed with treatment after reviewing the details related to the planned course of therapy. The consent form was witnessed and verified by the simulation staff.  Then, the patient was set-up in a stable reproducible supine position for radiation therapy.  A vacuum lock pillow device was custom fabricated to position his legs in a reproducible immobilized position.  Then, supervised the performance of a urethrogram under sterile conditions to identify the prostatic apex.  CT images were obtained.  Surface markings were placed.  The CT images were loaded into the planning software.  Then the prostate target and avoidance structures including the rectum, bladder, bowel and hips were contoured.  Treatment planning then occurred.  The radiation prescription was entered and confirmed.  A total of one complex treatment devices was fabricated. I have requested : Intensity Modulated Radiotherapy (IMRT) is medically necessary for this case for the following reason:  Rectal sparing.  I have requested daily cone beam CT volumetric image gudiance to track gold fiducial posiitoning along with bladder and rectal filling, this is medically necessary to assure accurate positioning of high dose radiation.  PLAN:  The patient will receive 70 Gy in 28 fractions.  ________________________________  Donnice FELIX Patrcia, M.D.

## 2024-01-21 NOTE — Telephone Encounter (Signed)
 CALLED PATIENT TO ASK HIM ABOUT RESCHEDULING MRI AND SIM, LVM FOR A RETURN CALL

## 2024-01-23 NOTE — Progress Notes (Signed)
 RN left message for call back to review need for CT Simulation to be reschedule and assess any possible new barriers.

## 2024-01-24 ENCOUNTER — Other Ambulatory Visit: Payer: Self-pay | Admitting: Urology

## 2024-01-24 ENCOUNTER — Telehealth: Payer: Self-pay | Admitting: *Deleted

## 2024-01-24 MED ORDER — LORAZEPAM 1 MG PO TABS: 1.0000 mg | ORAL_TABLET | ORAL | 0 refills | Status: AC

## 2024-01-24 NOTE — Telephone Encounter (Signed)
 Called patient's contact Crystal Williams to inform of sim and MRI being rescheduled for 02-03-24- arrival time- 10:15 am @ [facility], informed to have patient arrive with a full bladder and I also informed contact Camelia Pouch of MRI on  02-03-24- arrival time- 2:45 pm @ [facility] Radiology

## 2024-02-02 NOTE — Progress Notes (Signed)
  Radiation Oncology         [phone] ________________________________  Name: Isaac Reyes MRN: [id number]  Date: 02/03/2024  DOB: 04/16/1951  SIMULATION AND TREATMENT PLANNING NOTE    ICD-10-CM   1. Malignant neoplasm of prostate (HCC)  C61       DIAGNOSIS:   73 y.o. gentleman with Stage T1c adenocarcinoma of the prostate with Gleason Score of 4+3, and PSA of 12.4.   NARRATIVE:  The patient was brought to the CT Simulation planning suite.  Identity was confirmed.  All relevant records and images related to the planned course of therapy were reviewed.  The patient freely provided informed written consent to proceed with treatment after reviewing the details related to the planned course of therapy. The consent form was witnessed and verified by the simulation staff.  Then, the patient was set-up in a stable reproducible supine position for radiation therapy.  A vacuum lock pillow device was custom fabricated to position his legs in a reproducible immobilized position.  Then, I performed a urethrogram under sterile conditions to identify the prostatic apex.  CT images were obtained.  Surface markings were placed.  The CT images were loaded into the planning software.  Then the prostate target and avoidance structures including the rectum, bladder, bowel and hips were contoured.  Treatment planning then occurred.  The radiation prescription was entered and confirmed.  A total of one complex treatment devices was fabricated. I have requested : Intensity Modulated Radiotherapy (IMRT) is medically necessary for this case for the following reason:  Rectal sparing.SABRA  PLAN:  The patient will receive 70 Gy in 28 fractions, concurrent with Eligard ADT (45 mg 09/09/23).  ________________________________  Donnice FELIX Patrcia, M.D.

## 2024-02-03 ENCOUNTER — Ambulatory Visit
Admission: RE | Admit: 2024-02-03 | Discharge: 2024-02-03 | Disposition: A | Discharge status: Home / Self Care | Source: Ambulatory Visit | Attending: Radiation Oncology | Admitting: Radiation Oncology

## 2024-02-03 ENCOUNTER — Ambulatory Visit (HOSPITAL_COMMUNITY)
Admission: RE | Admit: 2024-02-03 | Discharge: 2024-02-03 | Disposition: A | Discharge status: Home / Self Care | Source: Ambulatory Visit | Attending: Urology | Admitting: Urology

## 2024-02-03 DIAGNOSIS — C61 Malignant neoplasm of prostate: Secondary | ICD-10-CM | POA: Insufficient documentation

## 2024-02-03 DIAGNOSIS — Z510 Encounter for antineoplastic radiation therapy: Secondary | ICD-10-CM | POA: Diagnosis present

## 2024-02-04 DIAGNOSIS — Z510 Encounter for antineoplastic radiation therapy: Secondary | ICD-10-CM | POA: Diagnosis not present

## 2024-02-12 ENCOUNTER — Other Ambulatory Visit: Payer: Self-pay

## 2024-02-12 ENCOUNTER — Ambulatory Visit
Admission: RE | Admit: 2024-02-12 | Discharge: 2024-02-12 | Disposition: A | Discharge status: Home / Self Care | Source: Ambulatory Visit | Attending: Radiation Oncology | Admitting: Radiation Oncology

## 2024-02-12 DIAGNOSIS — Z510 Encounter for antineoplastic radiation therapy: Secondary | ICD-10-CM | POA: Diagnosis not present

## 2024-02-12 LAB — RAD ONC ARIA SESSION SUMMARY
Course Elapsed Days: 0
Plan Fractions Treated to Date: 1
Plan Prescribed Dose Per Fraction: 2.5 Gy
Plan Total Fractions Prescribed: 28
Plan Total Prescribed Dose: 70 Gy
Reference Point Dosage Given to Date: 2.5 Gy
Reference Point Session Dosage Given: 2.5 Gy
Session Number: 1

## 2024-02-13 ENCOUNTER — Ambulatory Visit
Admission: RE | Admit: 2024-02-13 | Discharge: 2024-02-13 | Disposition: A | Discharge status: Home / Self Care | Source: Ambulatory Visit | Attending: Radiation Oncology | Admitting: Radiation Oncology

## 2024-02-13 ENCOUNTER — Other Ambulatory Visit: Payer: Self-pay

## 2024-02-13 ENCOUNTER — Other Ambulatory Visit: Payer: Self-pay | Admitting: Radiation Oncology

## 2024-02-13 DIAGNOSIS — Z510 Encounter for antineoplastic radiation therapy: Secondary | ICD-10-CM | POA: Diagnosis not present

## 2024-02-13 LAB — RAD ONC ARIA SESSION SUMMARY
Course Elapsed Days: 1
Plan Fractions Treated to Date: 2
Plan Prescribed Dose Per Fraction: 2.5 Gy
Plan Total Fractions Prescribed: 28
Plan Total Prescribed Dose: 70 Gy
Reference Point Dosage Given to Date: 5 Gy
Reference Point Session Dosage Given: 2.5 Gy
Session Number: 2

## 2024-02-13 MED ORDER — ALFUZOSIN HCL ER 10 MG PO TB24: 10.0000 mg | ORAL_TABLET | Freq: Every day | ORAL | 5 refills | Status: AC

## 2024-02-14 ENCOUNTER — Other Ambulatory Visit: Payer: Self-pay

## 2024-02-14 ENCOUNTER — Ambulatory Visit
Admission: RE | Admit: 2024-02-14 | Discharge: 2024-02-14 | Disposition: A | Discharge status: Home / Self Care | Source: Ambulatory Visit | Attending: Radiation Oncology | Admitting: Radiation Oncology

## 2024-02-14 DIAGNOSIS — Z510 Encounter for antineoplastic radiation therapy: Secondary | ICD-10-CM | POA: Diagnosis not present

## 2024-02-14 LAB — RAD ONC ARIA SESSION SUMMARY
Course Elapsed Days: 2
Plan Fractions Treated to Date: 3
Plan Prescribed Dose Per Fraction: 2.5 Gy
Plan Total Fractions Prescribed: 28
Plan Total Prescribed Dose: 70 Gy
Reference Point Dosage Given to Date: 7.5 Gy
Reference Point Session Dosage Given: 2.5 Gy
Session Number: 3

## 2024-02-17 ENCOUNTER — Ambulatory Visit
Admission: RE | Admit: 2024-02-17 | Discharge: 2024-02-17 | Disposition: A | Source: Ambulatory Visit | Attending: Radiation Oncology

## 2024-02-17 ENCOUNTER — Other Ambulatory Visit: Payer: Self-pay

## 2024-02-17 DIAGNOSIS — Z510 Encounter for antineoplastic radiation therapy: Secondary | ICD-10-CM | POA: Diagnosis not present

## 2024-02-17 LAB — RAD ONC ARIA SESSION SUMMARY
Course Elapsed Days: 5
Plan Fractions Treated to Date: 4
Plan Prescribed Dose Per Fraction: 2.5 Gy
Plan Total Fractions Prescribed: 28
Plan Total Prescribed Dose: 70 Gy
Reference Point Dosage Given to Date: 10 Gy
Reference Point Session Dosage Given: 2.5 Gy
Session Number: 4

## 2024-02-18 ENCOUNTER — Other Ambulatory Visit: Payer: Self-pay

## 2024-02-18 ENCOUNTER — Ambulatory Visit
Admission: RE | Admit: 2024-02-18 | Discharge: 2024-02-18 | Disposition: A | Discharge status: Home / Self Care | Source: Ambulatory Visit | Attending: Radiation Oncology | Admitting: Radiation Oncology

## 2024-02-18 DIAGNOSIS — Z510 Encounter for antineoplastic radiation therapy: Secondary | ICD-10-CM | POA: Diagnosis not present

## 2024-02-18 LAB — RAD ONC ARIA SESSION SUMMARY
Course Elapsed Days: 6
Plan Fractions Treated to Date: 5
Plan Prescribed Dose Per Fraction: 2.5 Gy
Plan Total Fractions Prescribed: 28
Plan Total Prescribed Dose: 70 Gy
Reference Point Dosage Given to Date: 12.5 Gy
Reference Point Session Dosage Given: 2.5 Gy
Session Number: 5

## 2024-02-19 ENCOUNTER — Other Ambulatory Visit: Payer: Self-pay

## 2024-02-19 ENCOUNTER — Ambulatory Visit
Admission: RE | Admit: 2024-02-19 | Discharge: 2024-02-19 | Disposition: A | Discharge status: Home / Self Care | Source: Ambulatory Visit | Attending: Radiation Oncology | Admitting: Radiation Oncology

## 2024-02-19 DIAGNOSIS — Z510 Encounter for antineoplastic radiation therapy: Secondary | ICD-10-CM | POA: Diagnosis present

## 2024-02-19 DIAGNOSIS — C61 Malignant neoplasm of prostate: Secondary | ICD-10-CM | POA: Diagnosis not present

## 2024-02-19 LAB — RAD ONC ARIA SESSION SUMMARY
Course Elapsed Days: 7
Plan Fractions Treated to Date: 6
Plan Prescribed Dose Per Fraction: 2.5 Gy
Plan Total Fractions Prescribed: 28
Plan Total Prescribed Dose: 70 Gy
Reference Point Dosage Given to Date: 15 Gy
Reference Point Session Dosage Given: 2.5 Gy
Session Number: 6

## 2024-02-20 ENCOUNTER — Other Ambulatory Visit: Payer: Self-pay

## 2024-02-20 ENCOUNTER — Ambulatory Visit
Admission: RE | Admit: 2024-02-20 | Discharge: 2024-02-20 | Disposition: A | Discharge status: Home / Self Care | Source: Ambulatory Visit | Attending: Radiation Oncology | Admitting: Radiation Oncology

## 2024-02-20 DIAGNOSIS — Z510 Encounter for antineoplastic radiation therapy: Secondary | ICD-10-CM | POA: Diagnosis not present

## 2024-02-20 LAB — RAD ONC ARIA SESSION SUMMARY
Course Elapsed Days: 8
Plan Fractions Treated to Date: 7
Plan Prescribed Dose Per Fraction: 2.5 Gy
Plan Total Fractions Prescribed: 28
Plan Total Prescribed Dose: 70 Gy
Reference Point Dosage Given to Date: 17.5 Gy
Reference Point Session Dosage Given: 2.5 Gy
Session Number: 7

## 2024-02-21 ENCOUNTER — Other Ambulatory Visit: Payer: Self-pay

## 2024-02-21 ENCOUNTER — Ambulatory Visit
Admission: RE | Admit: 2024-02-21 | Discharge: 2024-02-21 | Disposition: A | Discharge status: Home / Self Care | Source: Ambulatory Visit | Attending: Radiation Oncology | Admitting: Radiation Oncology

## 2024-02-21 DIAGNOSIS — Z510 Encounter for antineoplastic radiation therapy: Secondary | ICD-10-CM | POA: Diagnosis not present

## 2024-02-21 LAB — RAD ONC ARIA SESSION SUMMARY
Course Elapsed Days: 9
Plan Fractions Treated to Date: 8
Plan Prescribed Dose Per Fraction: 2.5 Gy
Plan Total Fractions Prescribed: 28
Plan Total Prescribed Dose: 70 Gy
Reference Point Dosage Given to Date: 20 Gy
Reference Point Session Dosage Given: 2.5 Gy
Session Number: 8

## 2024-02-24 ENCOUNTER — Ambulatory Visit
Admission: RE | Admit: 2024-02-24 | Discharge: 2024-02-24 | Disposition: A | Discharge status: Home / Self Care | Source: Ambulatory Visit | Attending: Radiation Oncology | Admitting: Radiation Oncology

## 2024-02-24 ENCOUNTER — Other Ambulatory Visit: Payer: Self-pay

## 2024-02-24 DIAGNOSIS — Z510 Encounter for antineoplastic radiation therapy: Secondary | ICD-10-CM | POA: Diagnosis not present

## 2024-02-24 LAB — RAD ONC ARIA SESSION SUMMARY
Course Elapsed Days: 12
Plan Fractions Treated to Date: 9
Plan Prescribed Dose Per Fraction: 2.5 Gy
Plan Total Fractions Prescribed: 28
Plan Total Prescribed Dose: 70 Gy
Reference Point Dosage Given to Date: 22.5 Gy
Reference Point Session Dosage Given: 2.5 Gy
Session Number: 9

## 2024-02-25 ENCOUNTER — Other Ambulatory Visit: Payer: Self-pay

## 2024-02-25 ENCOUNTER — Ambulatory Visit
Admission: RE | Admit: 2024-02-25 | Discharge: 2024-02-25 | Disposition: A | Discharge status: Home / Self Care | Source: Ambulatory Visit | Attending: Radiation Oncology | Admitting: Radiation Oncology

## 2024-02-25 DIAGNOSIS — Z510 Encounter for antineoplastic radiation therapy: Secondary | ICD-10-CM | POA: Diagnosis not present

## 2024-02-25 LAB — RAD ONC ARIA SESSION SUMMARY
Course Elapsed Days: 13
Plan Fractions Treated to Date: 10
Plan Prescribed Dose Per Fraction: 2.5 Gy
Plan Total Fractions Prescribed: 28
Plan Total Prescribed Dose: 70 Gy
Reference Point Dosage Given to Date: 25 Gy
Reference Point Session Dosage Given: 2.5 Gy
Session Number: 10

## 2024-02-26 ENCOUNTER — Ambulatory Visit
Admission: RE | Admit: 2024-02-26 | Discharge: 2024-02-26 | Disposition: A | Discharge status: Home / Self Care | Source: Ambulatory Visit | Attending: Radiation Oncology | Admitting: Radiation Oncology

## 2024-02-26 ENCOUNTER — Other Ambulatory Visit: Payer: Self-pay

## 2024-02-26 DIAGNOSIS — Z510 Encounter for antineoplastic radiation therapy: Secondary | ICD-10-CM | POA: Diagnosis not present

## 2024-02-26 LAB — RAD ONC ARIA SESSION SUMMARY
Course Elapsed Days: 14
Plan Fractions Treated to Date: 11
Plan Prescribed Dose Per Fraction: 2.5 Gy
Plan Total Fractions Prescribed: 28
Plan Total Prescribed Dose: 70 Gy
Reference Point Dosage Given to Date: 27.5 Gy
Reference Point Session Dosage Given: 2.5 Gy
Session Number: 11

## 2024-02-27 ENCOUNTER — Other Ambulatory Visit: Payer: Self-pay

## 2024-02-27 ENCOUNTER — Ambulatory Visit
Admission: RE | Admit: 2024-02-27 | Discharge: 2024-02-27 | Disposition: A | Discharge status: Home / Self Care | Source: Ambulatory Visit | Attending: Radiation Oncology | Admitting: Radiation Oncology

## 2024-02-27 DIAGNOSIS — Z510 Encounter for antineoplastic radiation therapy: Secondary | ICD-10-CM | POA: Diagnosis not present

## 2024-02-27 LAB — RAD ONC ARIA SESSION SUMMARY
Course Elapsed Days: 15
Plan Fractions Treated to Date: 12
Plan Prescribed Dose Per Fraction: 2.5 Gy
Plan Total Fractions Prescribed: 28
Plan Total Prescribed Dose: 70 Gy
Reference Point Dosage Given to Date: 30 Gy
Reference Point Session Dosage Given: 2.5 Gy
Session Number: 12

## 2024-02-28 ENCOUNTER — Ambulatory Visit

## 2024-02-28 ENCOUNTER — Other Ambulatory Visit: Payer: Self-pay

## 2024-02-28 ENCOUNTER — Ambulatory Visit
Admission: RE | Admit: 2024-02-28 | Discharge: 2024-02-28 | Disposition: A | Discharge status: Home / Self Care | Source: Ambulatory Visit | Attending: Radiation Oncology | Admitting: Radiation Oncology

## 2024-02-28 DIAGNOSIS — Z510 Encounter for antineoplastic radiation therapy: Secondary | ICD-10-CM | POA: Diagnosis not present

## 2024-02-28 LAB — RAD ONC ARIA SESSION SUMMARY
Course Elapsed Days: 16
Plan Fractions Treated to Date: 13
Plan Prescribed Dose Per Fraction: 2.5 Gy
Plan Total Fractions Prescribed: 28
Plan Total Prescribed Dose: 70 Gy
Reference Point Dosage Given to Date: 32.5 Gy
Reference Point Session Dosage Given: 2.5 Gy
Session Number: 13

## 2024-03-02 ENCOUNTER — Ambulatory Visit
Admission: RE | Admit: 2024-03-02 | Discharge: 2024-03-02 | Disposition: A | Discharge status: Home / Self Care | Source: Ambulatory Visit | Attending: Radiation Oncology | Admitting: Radiation Oncology

## 2024-03-02 ENCOUNTER — Other Ambulatory Visit: Payer: Self-pay

## 2024-03-02 DIAGNOSIS — Z510 Encounter for antineoplastic radiation therapy: Secondary | ICD-10-CM | POA: Diagnosis not present

## 2024-03-02 LAB — RAD ONC ARIA SESSION SUMMARY
Course Elapsed Days: 19
Plan Fractions Treated to Date: 14
Plan Prescribed Dose Per Fraction: 2.5 Gy
Plan Total Fractions Prescribed: 28
Plan Total Prescribed Dose: 70 Gy
Reference Point Dosage Given to Date: 35 Gy
Reference Point Session Dosage Given: 2.5 Gy
Session Number: 14

## 2024-03-03 ENCOUNTER — Other Ambulatory Visit: Payer: Self-pay

## 2024-03-03 ENCOUNTER — Ambulatory Visit
Admission: RE | Admit: 2024-03-03 | Discharge: 2024-03-03 | Disposition: A | Source: Ambulatory Visit | Attending: Radiation Oncology

## 2024-03-03 DIAGNOSIS — Z510 Encounter for antineoplastic radiation therapy: Secondary | ICD-10-CM | POA: Diagnosis not present

## 2024-03-03 LAB — RAD ONC ARIA SESSION SUMMARY
Course Elapsed Days: 20
Plan Fractions Treated to Date: 15
Plan Prescribed Dose Per Fraction: 2.5 Gy
Plan Total Fractions Prescribed: 28
Plan Total Prescribed Dose: 70 Gy
Reference Point Dosage Given to Date: 37.5 Gy
Reference Point Session Dosage Given: 2.5 Gy
Session Number: 15

## 2024-03-04 ENCOUNTER — Ambulatory Visit
Admission: RE | Admit: 2024-03-04 | Discharge: 2024-03-04 | Disposition: A | Source: Ambulatory Visit | Attending: Radiation Oncology

## 2024-03-04 ENCOUNTER — Other Ambulatory Visit: Payer: Self-pay

## 2024-03-04 DIAGNOSIS — Z510 Encounter for antineoplastic radiation therapy: Secondary | ICD-10-CM | POA: Diagnosis not present

## 2024-03-04 LAB — RAD ONC ARIA SESSION SUMMARY
Course Elapsed Days: 21
Plan Fractions Treated to Date: 16
Plan Prescribed Dose Per Fraction: 2.5 Gy
Plan Total Fractions Prescribed: 28
Plan Total Prescribed Dose: 70 Gy
Reference Point Dosage Given to Date: 40 Gy
Reference Point Session Dosage Given: 2.5 Gy
Session Number: 16

## 2024-03-05 ENCOUNTER — Ambulatory Visit
Admission: RE | Admit: 2024-03-05 | Discharge: 2024-03-05 | Disposition: A | Discharge status: Home / Self Care | Source: Ambulatory Visit | Attending: Radiation Oncology | Admitting: Radiation Oncology

## 2024-03-05 ENCOUNTER — Other Ambulatory Visit: Payer: Self-pay

## 2024-03-05 DIAGNOSIS — Z510 Encounter for antineoplastic radiation therapy: Secondary | ICD-10-CM | POA: Diagnosis not present

## 2024-03-05 LAB — RAD ONC ARIA SESSION SUMMARY
Course Elapsed Days: 22
Plan Fractions Treated to Date: 17
Plan Prescribed Dose Per Fraction: 2.5 Gy
Plan Total Fractions Prescribed: 28
Plan Total Prescribed Dose: 70 Gy
Reference Point Dosage Given to Date: 42.5 Gy
Reference Point Session Dosage Given: 2.5 Gy
Session Number: 17

## 2024-03-06 ENCOUNTER — Ambulatory Visit
Admission: RE | Admit: 2024-03-06 | Discharge: 2024-03-06 | Disposition: A | Source: Ambulatory Visit | Attending: Radiation Oncology

## 2024-03-06 ENCOUNTER — Other Ambulatory Visit: Payer: Self-pay

## 2024-03-06 ENCOUNTER — Ambulatory Visit
Admission: RE | Admit: 2024-03-06 | Discharge: 2024-03-06 | Disposition: A | Discharge status: Home / Self Care | Source: Ambulatory Visit | Attending: Radiation Oncology | Admitting: Radiation Oncology

## 2024-03-06 DIAGNOSIS — Z510 Encounter for antineoplastic radiation therapy: Secondary | ICD-10-CM | POA: Diagnosis not present

## 2024-03-06 LAB — RAD ONC ARIA SESSION SUMMARY
Course Elapsed Days: 23
Plan Fractions Treated to Date: 18
Plan Prescribed Dose Per Fraction: 2.5 Gy
Plan Total Fractions Prescribed: 28
Plan Total Prescribed Dose: 70 Gy
Reference Point Dosage Given to Date: 45 Gy
Reference Point Session Dosage Given: 2.5 Gy
Session Number: 18

## 2024-03-09 ENCOUNTER — Other Ambulatory Visit: Payer: Self-pay

## 2024-03-09 ENCOUNTER — Ambulatory Visit
Admission: RE | Admit: 2024-03-09 | Discharge: 2024-03-09 | Disposition: A | Discharge status: Home / Self Care | Source: Ambulatory Visit | Attending: Radiation Oncology | Admitting: Radiation Oncology

## 2024-03-09 DIAGNOSIS — Z510 Encounter for antineoplastic radiation therapy: Secondary | ICD-10-CM | POA: Diagnosis not present

## 2024-03-09 LAB — RAD ONC ARIA SESSION SUMMARY
Course Elapsed Days: 26
Plan Fractions Treated to Date: 19
Plan Prescribed Dose Per Fraction: 2.5 Gy
Plan Total Fractions Prescribed: 28
Plan Total Prescribed Dose: 70 Gy
Reference Point Dosage Given to Date: 47.5 Gy
Reference Point Session Dosage Given: 2.5 Gy
Session Number: 19

## 2024-03-10 ENCOUNTER — Ambulatory Visit
Admission: RE | Admit: 2024-03-10 | Discharge: 2024-03-10 | Disposition: A | Discharge status: Home / Self Care | Source: Ambulatory Visit | Attending: Radiation Oncology | Admitting: Radiation Oncology

## 2024-03-10 ENCOUNTER — Other Ambulatory Visit: Payer: Self-pay

## 2024-03-10 DIAGNOSIS — Z510 Encounter for antineoplastic radiation therapy: Secondary | ICD-10-CM | POA: Diagnosis not present

## 2024-03-10 LAB — RAD ONC ARIA SESSION SUMMARY
Course Elapsed Days: 27
Plan Fractions Treated to Date: 20
Plan Prescribed Dose Per Fraction: 2.5 Gy
Plan Total Fractions Prescribed: 28
Plan Total Prescribed Dose: 70 Gy
Reference Point Dosage Given to Date: 50 Gy
Reference Point Session Dosage Given: 2.5 Gy
Session Number: 20

## 2024-03-11 ENCOUNTER — Other Ambulatory Visit: Payer: Self-pay

## 2024-03-11 ENCOUNTER — Ambulatory Visit
Admission: RE | Admit: 2024-03-11 | Discharge: 2024-03-11 | Disposition: A | Discharge status: Home / Self Care | Source: Ambulatory Visit | Attending: Radiation Oncology | Admitting: Radiation Oncology

## 2024-03-11 DIAGNOSIS — Z510 Encounter for antineoplastic radiation therapy: Secondary | ICD-10-CM | POA: Diagnosis not present

## 2024-03-11 LAB — RAD ONC ARIA SESSION SUMMARY
Course Elapsed Days: 28
Plan Fractions Treated to Date: 21
Plan Prescribed Dose Per Fraction: 2.5 Gy
Plan Total Fractions Prescribed: 28
Plan Total Prescribed Dose: 70 Gy
Reference Point Dosage Given to Date: 52.5 Gy
Reference Point Session Dosage Given: 2.5 Gy
Session Number: 21

## 2024-03-12 ENCOUNTER — Ambulatory Visit
Admission: RE | Admit: 2024-03-12 | Discharge: 2024-03-12 | Disposition: A | Source: Ambulatory Visit | Attending: Radiation Oncology

## 2024-03-12 ENCOUNTER — Other Ambulatory Visit: Payer: Self-pay

## 2024-03-12 DIAGNOSIS — Z510 Encounter for antineoplastic radiation therapy: Secondary | ICD-10-CM | POA: Diagnosis not present

## 2024-03-12 LAB — RAD ONC ARIA SESSION SUMMARY
Course Elapsed Days: 29
Plan Fractions Treated to Date: 22
Plan Prescribed Dose Per Fraction: 2.5 Gy
Plan Total Fractions Prescribed: 28
Plan Total Prescribed Dose: 70 Gy
Reference Point Dosage Given to Date: 55 Gy
Reference Point Session Dosage Given: 2.5 Gy
Session Number: 22

## 2024-03-13 ENCOUNTER — Ambulatory Visit
Admission: RE | Admit: 2024-03-13 | Discharge: 2024-03-13 | Disposition: A | Discharge status: Home / Self Care | Source: Ambulatory Visit | Attending: Radiation Oncology | Admitting: Radiation Oncology

## 2024-03-13 ENCOUNTER — Other Ambulatory Visit: Payer: Self-pay

## 2024-03-13 DIAGNOSIS — Z510 Encounter for antineoplastic radiation therapy: Secondary | ICD-10-CM | POA: Diagnosis not present

## 2024-03-13 LAB — RAD ONC ARIA SESSION SUMMARY
Course Elapsed Days: 30
Plan Fractions Treated to Date: 23
Plan Prescribed Dose Per Fraction: 2.5 Gy
Plan Total Fractions Prescribed: 28
Plan Total Prescribed Dose: 70 Gy
Reference Point Dosage Given to Date: 57.5 Gy
Reference Point Session Dosage Given: 2.5 Gy
Session Number: 23

## 2024-03-16 ENCOUNTER — Other Ambulatory Visit: Payer: Self-pay

## 2024-03-16 ENCOUNTER — Ambulatory Visit
Admission: RE | Admit: 2024-03-16 | Discharge: 2024-03-16 | Disposition: A | Source: Ambulatory Visit | Attending: Radiation Oncology

## 2024-03-16 DIAGNOSIS — Z510 Encounter for antineoplastic radiation therapy: Secondary | ICD-10-CM | POA: Diagnosis not present

## 2024-03-16 LAB — RAD ONC ARIA SESSION SUMMARY
Course Elapsed Days: 33
Plan Fractions Treated to Date: 24
Plan Prescribed Dose Per Fraction: 2.5 Gy
Plan Total Fractions Prescribed: 28
Plan Total Prescribed Dose: 70 Gy
Reference Point Dosage Given to Date: 60 Gy
Reference Point Session Dosage Given: 2.5 Gy
Session Number: 24

## 2024-03-17 ENCOUNTER — Ambulatory Visit
Admission: RE | Admit: 2024-03-17 | Discharge: 2024-03-17 | Disposition: A | Discharge status: Home / Self Care | Source: Ambulatory Visit | Attending: Radiation Oncology | Admitting: Radiation Oncology

## 2024-03-17 ENCOUNTER — Other Ambulatory Visit: Payer: Self-pay

## 2024-03-17 DIAGNOSIS — Z510 Encounter for antineoplastic radiation therapy: Secondary | ICD-10-CM | POA: Diagnosis not present

## 2024-03-17 LAB — RAD ONC ARIA SESSION SUMMARY
Course Elapsed Days: 34
Plan Fractions Treated to Date: 25
Plan Prescribed Dose Per Fraction: 2.5 Gy
Plan Total Fractions Prescribed: 28
Plan Total Prescribed Dose: 70 Gy
Reference Point Dosage Given to Date: 62.5 Gy
Reference Point Session Dosage Given: 2.5 Gy
Session Number: 25

## 2024-03-18 ENCOUNTER — Other Ambulatory Visit: Payer: Self-pay

## 2024-03-18 ENCOUNTER — Ambulatory Visit
Admission: RE | Admit: 2024-03-18 | Discharge: 2024-03-18 | Disposition: A | Discharge status: Home / Self Care | Source: Ambulatory Visit | Attending: Radiation Oncology | Admitting: Radiation Oncology

## 2024-03-18 DIAGNOSIS — Z510 Encounter for antineoplastic radiation therapy: Secondary | ICD-10-CM | POA: Diagnosis not present

## 2024-03-18 LAB — RAD ONC ARIA SESSION SUMMARY
Course Elapsed Days: 35
Plan Fractions Treated to Date: 26
Plan Prescribed Dose Per Fraction: 2.5 Gy
Plan Total Fractions Prescribed: 28
Plan Total Prescribed Dose: 70 Gy
Reference Point Dosage Given to Date: 65 Gy
Reference Point Session Dosage Given: 2.5 Gy
Session Number: 26

## 2024-03-19 ENCOUNTER — Ambulatory Visit
Admission: RE | Admit: 2024-03-19 | Discharge: 2024-03-19 | Disposition: A | Discharge status: Home / Self Care | Source: Ambulatory Visit | Attending: Radiation Oncology | Admitting: Radiation Oncology

## 2024-03-19 ENCOUNTER — Ambulatory Visit
Admission: RE | Admit: 2024-03-19 | Discharge: 2024-03-19 | Disposition: A | Source: Ambulatory Visit | Attending: Radiation Oncology

## 2024-03-19 ENCOUNTER — Other Ambulatory Visit: Payer: Self-pay

## 2024-03-19 DIAGNOSIS — Z510 Encounter for antineoplastic radiation therapy: Secondary | ICD-10-CM | POA: Diagnosis not present

## 2024-03-19 LAB — RAD ONC ARIA SESSION SUMMARY
Course Elapsed Days: 36
Plan Fractions Treated to Date: 27
Plan Prescribed Dose Per Fraction: 2.5 Gy
Plan Total Fractions Prescribed: 28
Plan Total Prescribed Dose: 70 Gy
Reference Point Dosage Given to Date: 67.5 Gy
Reference Point Session Dosage Given: 2.5 Gy
Session Number: 27

## 2024-03-20 ENCOUNTER — Ambulatory Visit
Admission: RE | Admit: 2024-03-20 | Discharge: 2024-03-20 | Disposition: A | Discharge status: Home / Self Care | Source: Ambulatory Visit | Attending: Radiation Oncology | Admitting: Radiation Oncology

## 2024-03-20 ENCOUNTER — Other Ambulatory Visit: Payer: Self-pay

## 2024-03-20 DIAGNOSIS — Z510 Encounter for antineoplastic radiation therapy: Secondary | ICD-10-CM | POA: Diagnosis not present

## 2024-03-20 LAB — RAD ONC ARIA SESSION SUMMARY
Course Elapsed Days: 37
Plan Fractions Treated to Date: 28
Plan Prescribed Dose Per Fraction: 2.5 Gy
Plan Total Fractions Prescribed: 28
Plan Total Prescribed Dose: 70 Gy
Reference Point Dosage Given to Date: 70 Gy
Reference Point Session Dosage Given: 2.5 Gy
Session Number: 28

## 2024-03-25 NOTE — Radiation Completion Notes (Signed)
 Patient Name: Isaac Reyes, Isaac Reyes MRN: [id number] Date of Birth: [birth year] Referring Physician: ARLYSS FOOT, M.D. Date of Service: 2024-03-25 Radiation Oncologist: Adina Barge, M.D. [facility]                             RADIATION ONCOLOGY END OF TREATMENT NOTE     Diagnosis: C61 Malignant neoplasm of prostate Staging on 2023-06-24: Malignant neoplasm of prostate (HCC) T=cT1c, N=cN0, M=cM0 Intent: Curative     ==========DELIVERED PLANS==========  First Treatment Date: 2024-02-12 Last Treatment Date: 2024-03-20   Plan Name: Prostate Site: Prostate Technique: IMRT Mode: Photon Dose Per Fraction: 2.5 Gy Prescribed Dose (Delivered / Prescribed): 70 Gy / 70 Gy Prescribed Fxs (Delivered / Prescribed): 28 / 28     ==========ON TREATMENT VISIT DATES========== 2024-02-13, 2024-02-21, 2024-02-27, 2024-03-06, 2024-03-13, 2024-03-19     ==========UPCOMING VISITS========== 06/01/2024 CR-RHEUM Porter NEW RHEUM Jeannetta Lonni ORN, MD        ==========APPENDIX - ON TREATMENT VISIT NOTES==========   See weekly On Treatment Notes in Epic for details in the Media tab (listed as Progress notes on the On Treatment Visit Dates listed above).

## 2024-03-30 NOTE — Progress Notes (Signed)
 Patient was a RadOnc Consult on 08/23/23 for his stage T1c adenocarcinoma of the prostate with Gleason score of 4+3, and PSA of 12.4.  Patient proceed with treatment recommendations of 5.5 weeks of external beam therapy concurrent with ST-ADT and had his final radiation treatment on 03/20/24.   Patient will be scheduled for a post treatment nurse call.

## 2024-04-07 NOTE — Progress Notes (Signed)
  Radiation Oncology         [phone] ________________________________  Name: [name] MRN: [id number]  Date of Service: 04/14/2024  DOB: May 26, 1950  Post Treatment Telephone Note  Diagnosis:  Malignant neoplasm of prostate   First Treatment Date: 2024-02-12 Last Treatment Date: 2024-03-20   Plan Name: Prostate Site: Prostate Technique: IMRT Mode: Photon Dose Per Fraction: 2.5 Gy Prescribed Dose (Delivered / Prescribed): 70 Gy / 70 Gy Prescribed Fxs (Delivered / Prescribed): 28 / 28     Pre Treatment IPSS Score: 7 (as documented in the provider consult note)  The patient was available for call today.   Symptoms of fatigue have improved since completing therapy.  Symptoms of bladder changes have not improved since completing therapy. Current symptoms include frequency, weak stream and nocturia. Medications for bladder symptoms include Alfuzosin .  Symptoms of bowel changes have improved since completing therapy.   Post Treatment IPSS Score: IPSS Questionnaire (AUA-7): Over the past month.   1)  How often have you had a sensation of not emptying your bladder completely after you finish urinating?  3 - About half the time  2)  How often have you had to urinate again less than two hours after you finished urinating? 3 - About half the time  3)  How often have you found you stopped and started again several times when you urinated?  0 - Not at all  4) How difficult have you found it to postpone urination?  4 - More than half the time  5) How often have you had a weak urinary stream?  5 - Almost always  6) How often have you had to push or strain to begin urination?  0 - Not at all  7) How many times did you most typically get up to urinate from the time you went to bed until the time you got up in the morning?  3 - 3 times  Total score:  18. Which indicates moderate symptoms  0-7 mildly symptomatic   8-19 moderately symptomatic   20-35 severely symptomatic    Patient has a scheduled follow up visit with his urologist, Dr. Norleen Seltzer, in December for ongoing surveillance. He was counseled that PSA levels will be drawn in the urology office, and was reassured that additional time is expected to improve bowel and bladder symptoms. He was encouraged to call back with concerns or questions regarding radiation.

## 2024-04-14 ENCOUNTER — Ambulatory Visit
Admission: RE | Admit: 2024-04-14 | Discharge: 2024-04-14 | Disposition: A | Discharge status: Home / Self Care | Source: Ambulatory Visit | Attending: Radiation Oncology | Admitting: Radiation Oncology

## 2024-04-14 DIAGNOSIS — C61 Malignant neoplasm of prostate: Secondary | ICD-10-CM

## 2024-05-31 ENCOUNTER — Other Ambulatory Visit: Payer: Self-pay

## 2024-05-31 ENCOUNTER — Encounter (HOSPITAL_BASED_OUTPATIENT_CLINIC_OR_DEPARTMENT_OTHER): Payer: Self-pay

## 2024-05-31 ENCOUNTER — Emergency Department (HOSPITAL_BASED_OUTPATIENT_CLINIC_OR_DEPARTMENT_OTHER): Admission: EM | Admit: 2024-05-31 | Discharge: 2024-05-31 | Disposition: A | Discharge status: Home / Self Care

## 2024-05-31 DIAGNOSIS — Z8546 Personal history of malignant neoplasm of prostate: Secondary | ICD-10-CM | POA: Insufficient documentation

## 2024-05-31 DIAGNOSIS — R600 Localized edema: Secondary | ICD-10-CM | POA: Insufficient documentation

## 2024-05-31 DIAGNOSIS — M7989 Other specified soft tissue disorders: Secondary | ICD-10-CM | POA: Insufficient documentation

## 2024-05-31 DIAGNOSIS — N189 Chronic kidney disease, unspecified: Secondary | ICD-10-CM | POA: Insufficient documentation

## 2024-05-31 DIAGNOSIS — Z7982 Long term (current) use of aspirin: Secondary | ICD-10-CM | POA: Insufficient documentation

## 2024-05-31 LAB — CBC
HCT: 41.2 % (ref 39.0–52.0)
Hemoglobin: 12.6 g/dL — ABNORMAL LOW (ref 13.0–17.0)
MCH: 25.2 pg — ABNORMAL LOW (ref 26.0–34.0)
MCHC: 30.6 g/dL (ref 30.0–36.0)
MCV: 82.4 fL (ref 80.0–100.0)
Platelets: 177 K/uL (ref 150–400)
RBC: 5 MIL/uL (ref 4.22–5.81)
RDW: 15.5 % (ref 11.5–15.5)
WBC: 4.2 K/uL (ref 4.0–10.5)
nRBC: 0 % (ref 0.0–0.2)

## 2024-05-31 LAB — BASIC METABOLIC PANEL WITH GFR
Anion gap: 12 (ref 5–15)
BUN: 17 mg/dL (ref 8–23)
CO2: 25 mmol/L (ref 22–32)
Calcium: 9.1 mg/dL (ref 8.9–10.3)
Chloride: 106 mmol/L (ref 98–111)
Creatinine, Ser: 1.17 mg/dL (ref 0.61–1.24)
GFR, Estimated: >60 mL/min
Glucose, Bld: 123 mg/dL — ABNORMAL HIGH (ref 70–99)
Potassium: 3.5 mmol/L (ref 3.5–5.1)
Sodium: 143 mmol/L (ref 135–145)

## 2024-05-31 LAB — PRO BRAIN NATRIURETIC PEPTIDE: Pro Brain Natriuretic Peptide: 173 pg/mL

## 2024-05-31 NOTE — Discharge Instructions (Signed)
 Please follow-up with your primary doctor.  Wear compression stockings.  Try to elevate your legs above the level of your heart while sitting.  Return for fevers, chills, chest pain, shortness of breath, lightheadedness, passout, worsening swelling, legs become blue, cold, pale or conversely red hot or swollen.  You may also return if develop any new or worsening symptoms that are concerning to you.

## 2024-05-31 NOTE — ED Provider Notes (Signed)
 " [place] EMERGENCY DEPARTMENT AT [facility] Provider Note   CSN: [id number] Arrival date & time: 05/31/24  1117     Patient presents with: Leg Swelling (BIL ankle)   Isaac Reyes is a 74 y.o. male.   Is a 74 year old male presenting emergency department for bilateral ankle swelling.  No history of CHF.  No reported trauma.  No fevers no chills.  Prior to swelling he has been having some hip and knee pain and been taking more naproxen and ibuprofen.  He has been less active and sitting in a chair with his legs down towards the ground more.  He denies lightheadedness, chest pain, shortness of breath, difficulty breathing.  Significant other at bedside noted cardiac history and has been worked up in the past.        Prior to Admission medications  Medication Sig Start Date End Date Taking? Authorizing Provider  alfuzosin  (UROXATRAL ) 10 MG 24 hr tablet Take 1 tablet (10 mg total) by mouth daily with breakfast. 02/13/24   Patrcia Cough, MD  amLODipine  (NORVASC ) 10 MG tablet Take 1 tablet (10 mg total) by mouth daily. 07/27/17   [name], Saima, MD  aspirin  EC 81 MG tablet Take 81 mg by mouth every morning. Swallow whole.    [provider]  clopidogrel  (PLAVIX ) 75 MG tablet Take 1 tablet (75 mg total) by mouth daily. 02/23/23   Lenon Marien CROME, MD  ezetimibe  (ZETIA ) 10 MG tablet Take 1 tablet (10 mg total) by mouth daily. Patient taking differently: Take 10 mg by mouth daily. 02/23/23   Lenon Marien CROME, MD  ibuprofen (ADVIL) 200 MG tablet Take 200 mg by mouth every 6 (six) hours as needed for mild pain (pain score 1-3).    [provider]  LORazepam  (ATIVAN ) 1 MG tablet Take 1 tablet (1 mg total) by mouth as directed. Take one tablet 30 minutes prior to MRI and may repeat once, just prior to scan, if needed 01/24/24   Bruning, Ashlyn, PA-C  Multiple Vitamin (MULTIVITAMIN WITH MINERALS) TABS tablet Take 1 tablet by mouth every morning.    [provider]  RESTASIS 0.05 % ophthalmic emulsion Place 1 drop into both eyes 2 (two) times daily. 06/14/23   [provider]  sulfamethoxazole-trimethoprim (BACTRIM DS) 800-160 MG tablet Take 1 tablet by mouth 2 (two) times daily. 01/14/24   [provider]  Vitamin D, Ergocalciferol, (DRISDOL) 1.25 MG (50000 UNIT) CAPS capsule Take 50,000 Units by mouth once a week. Monday's 06/10/23   [provider]    Allergies: Flomax  [tamsulosin  hcl]    Review of Systems  Updated Vital Signs BP 126/69 (BP Location: Right Arm)   Pulse 74   Temp 98.8 F (37.1 C) (Oral)   Resp 17   SpO2 100%   Physical Exam Vitals and nursing note reviewed.  Constitutional:      General: He is not in acute distress.    Appearance: He is not toxic-appearing.  HENT:     Head: Normocephalic.     Nose: Nose normal.     Mouth/Throat:     Mouth: Mucous membranes are moist.  Eyes:     Conjunctiva/sclera: Conjunctivae normal.  Cardiovascular:     Rate and Rhythm: Normal rate and regular rhythm.  Pulmonary:     Effort: Pulmonary effort is normal. No respiratory distress.     Breath sounds: Normal breath sounds. No wheezing, rhonchi or rales.  Abdominal:     General: Abdomen  is flat. There is no distension.     Tenderness: There is no abdominal tenderness. There is no guarding or rebound.  Musculoskeletal:        General: Normal range of motion.     Right lower leg: Edema present.     Left lower leg: Edema present.     Comments: Patient has edema to his feet just above his malleolus bilaterally.  Swelling is symmetrical.  No overlying erythema or warmth.  No pain with manipulation or ROM to the ankles.  Soft compartments.  Equal pulses.  Neurovascularly intact.  Skin:    General: Skin is warm.     Capillary Refill: Capillary refill takes less than 2 seconds.  Neurological:     Mental Status: He is alert and oriented to person, place, and time.  Psychiatric:        Mood and Affect:  Mood normal.        Behavior: Behavior normal.     (all labs ordered are listed, but only abnormal results are displayed) Labs Reviewed  CBC - Abnormal; Notable for the following components:      Result Value   Hemoglobin 12.6 (*)    MCH 25.2 (*)    All other components within normal limits  BASIC METABOLIC PANEL WITH GFR - Abnormal; Notable for the following components:   Glucose, Bld 123 (*)    All other components within normal limits  PRO BRAIN NATRIURETIC PEPTIDE    EKG: None  Radiology: No results found.   Procedures   Medications Ordered in the ED - No data to display                                  Medical Decision Making This is a 74 year old male complex past medical history to include SVT, prior cocaine use, prior stroke hyperlipidemia, CKD, prostate cancer, history of alcohol abuse presenting with bilateral ankle swelling.  Clinically well-appearing vital signs are normal.  He has symmetric swelling to his feet and ankles.  Does not extend up the shin.  He has no history of heart failure.  His echo in 2024 with normal EF.  He has no symptoms of acute decompensated heart failure.  Lab workup today with no elevated BNP as well.  No electrolyte abnormalities.  No normal kidney function.  Minor elevated hemoglobin.  I suspect his swelling is secondary to dependent edema given his increased pain and decreased activity for the past few days and sitting with his legs down the floor.  Encouraged patient to wear compression stockings and elevate his legs.  He does have an appointment with his primary doctor in a few days.  I have a low suspicion for DVT given the clinical context and physical exam with bilateral symmetric swelling.  Will discharge in stable condition.  Strict return precautions given.  Amount and/or Complexity of Data Reviewed Independent Historian:     Details: Wife noted normal cardiac testing last year. External Data Reviewed:     Details: Echo last  year1. Left ventricular ejection fraction, by estimation, is 60 to 65%. The  left ventricle has normal function. The left ventricle has no regional  wall motion abnormalities. There is mild concentric left ventricular  hypertrophy. Left ventricular diastolic  parameters are consistent with Grade I diastolic dysfunction (impaired  relaxation).   2. Right ventricular systolic function is normal. The right ventricular  size is normal.  3. Left atrial size was mild to moderately dilated.   4. The mitral valve is normal in structure. Trivial mitral valve  regurgitation. No evidence of mitral stenosis.   5. The aortic valve is normal in structure. Aortic valve regurgitation is  trivial. No aortic stenosis is present.   6. The inferior vena cava is dilated in size with <50% respiratory  variability, suggesting right atrial pressure of 15 mmHg.   Labs: ordered. Radiology:     Details: Considered ultrasound, however again suspect dependent edema as cause of his swelling.  Risk Decision regarding hospitalization. Diagnosis or treatment significantly limited by social determinants of health.      Final diagnoses:  None    ED Discharge Orders     None          Neysa Caron PARAS, DO 05/31/24 1320  "

## 2024-05-31 NOTE — ED Triage Notes (Signed)
 Reports BIL ankle swelling for 3 days.  Denies chest pain, SHOB.  Ambulatory

## 2024-06-01 ENCOUNTER — Ambulatory Visit: Attending: Internal Medicine | Admitting: Internal Medicine

## 2024-06-01 ENCOUNTER — Encounter: Payer: Self-pay | Admitting: Internal Medicine

## 2024-06-01 VITALS — BP 145/84 | HR 75 | Temp 96.6°F | Resp 16 | Ht 72.5 in | Wt 234.0 lb

## 2024-06-01 DIAGNOSIS — R21 Rash and other nonspecific skin eruption: Secondary | ICD-10-CM

## 2024-06-01 DIAGNOSIS — R29898 Other symptoms and signs involving the musculoskeletal system: Secondary | ICD-10-CM

## 2024-06-01 DIAGNOSIS — R7689 Other specified abnormal immunological findings in serum: Secondary | ICD-10-CM

## 2024-06-01 NOTE — Assessment & Plan Note (Addendum)
" °  Muscle weakness and gait disturbance with history of strokes. Differential includes myositis and lupus. No significant inflammation. Left leg weakness possibly stroke-related. - Ordered blood tests for lupus and myositis antibody markers. - Ordered inflammatory markers. - Consider referral to neurologist or rehab specialist if tests are negative for autoimmune disease.  Orders:   Sedimentation rate   C-reactive protein   C3 and C4   RNP Antibody   Anti-Smith antibody   Sjogrens syndrome-A extractable nuclear antibody   Anti-DNA antibody, double-stranded   CK   Myositis Specific II Antibodies Panel  "

## 2024-06-01 NOTE — Progress Notes (Signed)
 "  Office Visit Note  Patient: Isaac Reyes             Date of Birth: 1951/04/06           MRN: [id number]             PCP: Leontine Cramp, NP Referring: Leontine Cramp, NP Visit Date: 06/01/2024 Occupation: Data Unavailable  Subjective:  New Patient (Initial Visit) (Muscle weakness, believes to have skin lupus )   Discussed the use of AI scribe software for clinical note transcription with the patient, who gave verbal consent to proceed.  History of Present Illness   Isaac Reyes is a 74 year old male referred due to concerns about abnormal lab tests, including a positive ANA test, and symptoms suggestive of lupus or other inflammatory conditions.  He experiences significant muscle weakness, particularly in his legs, describing them as 'jelly legs' and expressing a lack of trust in his strength. This weakness persists despite attempts at using walkers or canes. He has undergone physical therapy, but the source of the leg weakness remains unclear. An orthopedic evaluation was performed after the patient reported hip soreness. He has tried Motrin for relief, which helped temporarily, but was discontinued due to potential gastrointestinal side effects. He was also prescribed a low dose of prednisone for a week in September, but it is unclear if it provided significant benefit.  He has a history of prostate cancer treated with five weeks of radiation therapy and a single hormone therapy injection. Never had prolonged systemic therapy. He reports increased urinary urgency post-treatment, which has improved but still presents challenges, especially with his mobility issues.  He has a skin condition with patches on his face and scalp, suspected to be possibly cutaneous lupus. He has had this condition for several years, and it does not cause itching or inflammation currently. He has not had a biopsy to confirm the diagnosis.  No pain or numbness in his legs, describing the issue as primarily  weakness and wobbliness. No similar weakness in his arms.  He experienced ankle swelling a few days ago, which was evaluated at urgent care with blood tests showing no significant findings. The swelling has since decreased after elevating his legs and reducing salt intake.       Activities of Daily Living:  Patient reports morning stiffness for  none.   Patient Denies nocturnal pain.  Difficulty dressing/grooming: Reports Difficulty climbing stairs: Reports Difficulty getting out of chair: Reports Difficulty using hands for taps, buttons, cutlery, and/or writing: Reports  Review of Systems  Constitutional:  Negative for fatigue.  HENT:  Negative for mouth sores and mouth dryness.   Eyes:  Positive for dryness.  Respiratory:  Negative for shortness of breath.   Cardiovascular:  Negative for chest pain and palpitations.  Gastrointestinal:  Negative for blood in stool, constipation and diarrhea.  Endocrine: Positive for increased urination.  Genitourinary:  Positive for involuntary urination.  Musculoskeletal:  Positive for joint pain, gait problem, joint pain, joint swelling and muscle weakness. Negative for myalgias, morning stiffness, muscle tenderness and myalgias.  Skin:  Negative for color change, rash, hair loss and sensitivity to sunlight.  Allergic/Immunologic: Negative for susceptible to infections.  Neurological:  Negative for dizziness and headaches.  Hematological:  Negative for swollen glands.  Psychiatric/Behavioral:  Negative for depressed mood and sleep disturbance. The patient is not nervous/anxious.     PMFS History:  Patient Active Problem List   Diagnosis Date Noted   Positive ANA (  antinuclear antibody) 06/01/2024   Rash and other nonspecific skin eruption 06/01/2024   Leg weakness, bilateral 06/01/2024   Malignant neoplasm of prostate (HCC) 08/11/2023   Seizure-like activity (HCC) 02/20/2023   Acute ischemic stroke (HCC) 02/19/2023   History of cocaine abuse  (HCC) 02/19/2023   Stroke (HCC) 02/19/2023   Syncope and collapse 07/01/2020   Syncope 11/07/2017   Benign essential HTN 07/26/2017   Acute kidney failure 07/24/2017   CVA (cerebral vascular accident) (HCC) 07/19/2017   Stroke (cerebrum) (HCC) 07/18/2017   NSVT (nonsustained ventricular tachycardia) (HCC) 07/18/2017   Elevated blood pressure reading 07/18/2017   Normocytic normochromic anemia 07/18/2017   Malnutrition of moderate degree 07/18/2017   Normocytic anemia    Frequent falls    Renal insufficiency    Hyperlipidemia    Heavy smoker    Alcohol abuse    Abnormal EKG 03/19/2013   Pre-operative cardiovascular exam, new EKG abnormalities c/w ischemia 03/19/2013   LVH (left ventricular hypertrophy) 03/19/2013   Murmur, cardiac 03/19/2013    Past Medical History:  Diagnosis Date   Alcohol abuse    Anemia    Benign localized prostatic hyperplasia with lower urinary tract symptoms (LUTS)    CKD (chronic kidney disease) stage 3, GFR 30-59 ml/min (HCC)    Cocaine use disorder (HCC)    Cutaneous lupus erythematosus    skin kind   Deficit in communication due to slurred speech    Depression    Gait abnormality    Heart disease    cardiologist--  dr edison;   [facility] 12-27-2023  IR w/ small area mild ischemia d/t mild reduced ef 46% but per echo 02-19-2023  ef 60-65%,  mild concentrial LVH, G1DD, mild -mod LAE;   [facility] 12-04-2023  NS w/ no arrhythmias   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 [facility])    Hemorrhoids    History of CVA (cerebrovascular accident) without residual deficits    neurologist-- dr rosemarie graciela 07-14-2019)  residual right-side weakness/ speech defict ;known CVAs;  07-17-2017 acute/ subacute anterior putamen punctate w/ multiple old infarcts;  07-16-2018  acute left caudate, subacute right cerebellum;   02-18-2023 acute small punctate infact   History of supraventricular tachycardia 06/2017   in setting of admission for acute CVA    History of syncope    work-up by cardiology--- dr hochrien/   hx multiple ED / admission's for syncope/ collapse w/ LOC ,  some unknown etiology, other's pt AMA,  in setting CVAs,  in setting of positive cocaine   HLD (hyperlipidemia)    Holosystolic murmur    Hyperlipidemia    Hypertension    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 [place] it was done at [facility] in [place]   GOLD SEED IMPLANT N/A 01/17/2024   Procedure: INSERTION,  GOLD SEEDS;  Surgeon: Watt Rush, MD;  Location[facility];  Service: Urology;  Laterality: N/A;   KNEE SURGERY Left 1970   SPACE OAR INSTILLATION N/A 01/17/2024   Procedure: INJECTION, HYDROGEL SPACER;  Surgeon: Watt Rush, MD;  Location: [facility] OR;  Service: Urology;  Laterality: N/A;   TONSILLECTOMY     child   Social History[1] Social History   Social History Narrative   He lives alone.  He was a chartered certified accountant among other jobs.      Immunization History  Administered Date(s) Administered   Influenza,inj,Quad PF,6+ Mos 03/04/2018   PFIZER(Purple Top)SARS-COV-2 Vaccination 07/17/2019, 08/11/2019     Objective: Vital Signs: BP (!) 145/84 (BP Location: Left Arm, Patient Position: Sitting, Cuff Size: Normal)   Pulse 75   Temp (!) 96.6 F (35.9 C)   Resp 16   Ht 6' 0.5 (1.842 m)   Wt 234 lb (106.1 kg)   BMI 31.30 kg/m    Physical Exam Eyes:     Conjunctiva/sclera: Conjunctivae normal.  Cardiovascular:      Rate and Rhythm: Normal rate and regular rhythm.  Pulmonary:     Effort: Pulmonary effort is normal.     Breath sounds: Normal breath sounds.  Lymphadenopathy:     Cervical: No cervical adenopathy.  Skin:    General: Skin is warm and dry.     Findings: Rash present.     Comments: Hypopigmented flat patches on scalp and cheeks, no discoloration on border no erythema or swelling No visible scarring alopecia  Neurological:     Mental Status: He is alert.     Comments: Upper extremity strength intact. Lower extremity strength reduced, left weaker than right. Hamstring, quadriceps, dorsiflexion, and plantar flexion strength reduced bilaterally  Psychiatric:        Mood and Affect: Mood normal.      Musculoskeletal Exam:  Shoulders full ROM no tenderness or swelling Elbows full ROM no tenderness or swelling Wrists full ROM no tenderness or swelling Fingers full ROM b/l heberdon's nodes Knees full ROM no tenderness or swelling   Investigation: No additional findings.  Imaging: No results found.  Recent Labs: Lab Results  Component Value Date   WBC 4.2 05/31/2024   HGB 12.6 (L) 05/31/2024   PLT 177 05/31/2024   NA 143 05/31/2024   K 3.5 05/31/2024   CL 106 05/31/2024   CO2 25 05/31/2024   GLUCOSE 123 (H) 05/31/2024   BUN 17 05/31/2024   CREATININE 1.17 05/31/2024   BILITOT 0.6 04/25/2023   ALKPHOS 109 04/25/2023   AST 21 04/25/2023   ALT 21 04/25/2023   PROT 7.2 04/25/2023   ALBUMIN 3.7 04/25/2023   CALCIUM  9.1 05/31/2024   GFRAA 45 (L) 12/22/2018    Speciality Comments: No specialty comments available.  Procedures:  No procedures performed Allergies: Flomax  [tamsulosin  hcl]   Assessment / Plan:     Visit Diagnoses:  Assessment & Plan Positive ANA (antinuclear antibody) Leg weakness, bilateral  Muscle weakness and gait disturbance with history of strokes. Differential includes myositis and lupus. No significant inflammation. Left leg weakness possibly  stroke-related. - Ordered blood tests for lupus and myositis antibody markers. - Ordered inflammatory markers. - Consider referral to neurologist or rehab specialist if tests are negative for autoimmune disease.  Orders:   Sedimentation rate   C-reactive protein   C3 and C4   RNP Antibody   Anti-Smith antibody   Sjogrens syndrome-A extractable nuclear antibody   Anti-DNA antibody, double-stranded  CK   Myositis Specific II Antibodies Panel  Rash and other nonspecific skin eruption Evaluation of possible cutaneous autoimmune disease (vitiligo vs. cutaneous lupus) Facial and scalp rash with clear white areas, no inflammatory edges. Vitiligo suspected. Previous skin lupus unlikely to progress to systemic lupus. - Ordered blood tests for lupus antibody markers.       Follow-Up Instructions: No follow-ups on file.   Lonni LELON Ester, MD  Note - This record has been created using [employer].  Chart creation errors have been sought, but may not always  have been located. Such creation errors do not reflect on  the standard of medical care.     [1]  Social History Tobacco Use   Smoking status: Former    Current packs/day: 0.00    Average packs/day: 0.1 packs/day for 45.0 years (5.4 ttl pk-yrs)    Types: Cigars, Cigarettes    Start date: 10/20/1971    Quit date: 10/19/2016    Years since quitting: 7.6    Passive exposure: Never   Smokeless tobacco: Never  Vaping Use   Vaping status: Never Used  Substance Use Topics   Alcohol use: Not Currently    Comment: 01-16-2024  pt stated does not remember last drank   Drug use: Not Currently    Types: Cocaine    Comment: 01-16-2024  pt stated does not remember when last time used   "

## 2024-06-01 NOTE — Assessment & Plan Note (Addendum)
 Evaluation of possible cutaneous autoimmune disease (vitiligo vs. cutaneous lupus) Facial and scalp rash with clear white areas, no inflammatory edges. Vitiligo suspected. Previous skin lupus unlikely to progress to systemic lupus. - Ordered blood tests for lupus antibody markers.

## 2024-06-05 LAB — MYOSITIS SPECIFIC II ANTIBODIES PANEL
EJ AB: <11 SI
JO-1 AB: <11 SI
MDA-5 AB: <11 SI
MI-2 ALPHA AB: <11 SI
MI-2 BETA AB: <11 SI
NXP-2 AB: <11 SI
OJ AB: <11 SI
PL-12 AB: <11 SI
PL-7 AB: 12 SI — ABNORMAL HIGH
SRP-AB: 18 SI — ABNORMAL HIGH
TIF-1y AB: <11 SI

## 2024-06-05 LAB — SEDIMENTATION RATE: Sed Rate: 11 mm/h (ref 0–20)

## 2024-06-05 LAB — C-REACTIVE PROTEIN: CRP: 4.1 mg/L

## 2024-06-05 LAB — RNP ANTIBODY: Ribonucleic Protein(ENA) Antibody, IgG: <1 AI

## 2024-06-05 LAB — ANTI-SMITH ANTIBODY: ENA SM Ab Ser-aCnc: <1 AI

## 2024-06-05 LAB — ANTI-DNA ANTIBODY, DOUBLE-STRANDED: ds DNA Ab: <1 [IU]/mL

## 2024-06-05 LAB — SJOGRENS SYNDROME-A EXTRACTABLE NUCLEAR ANTIBODY: SSA (Ro) (ENA) Antibody, IgG: <1 AI

## 2024-06-05 LAB — CK: Total CK: 93 U/L (ref 19–278)

## 2024-06-05 LAB — C3 AND C4
C3 Complement: 132 mg/dL (ref 82–185)
C4 Complement: 29 mg/dL (ref 15–53)

## 2024-06-16 ENCOUNTER — Telehealth: Payer: Self-pay

## 2024-06-16 ENCOUNTER — Ambulatory Visit: Payer: Self-pay | Admitting: Internal Medicine

## 2024-06-16 NOTE — Progress Notes (Signed)
 His lab test show borderline positive test for possible myositis, which is a condition of inflammation affecting the muscles that can cause weakness.  His other tests for systemic lupus were all negative.  The test looking for active inflammation or muscle damage at this time were also negative so I am not clear if antibody test truly indicate a disease or not. I recommend we get a EMG/nerve conduction study to try and determine if the weakness is truly coming from muscle inflammation or due to nerve damage.  If we can confirm muscle inflammation we could treat this with medication.  We could refer him for this unless he already has an established outpatient neurologist.

## 2024-06-16 NOTE — Telephone Encounter (Signed)
 Patient and his friend Crystal called regarding lab results from 06/01/2024. Please advise. Patient is requesting a return call for the next step.

## 2024-06-16 NOTE — Telephone Encounter (Signed)
 Now addressed in associated result note

## 2024-06-17 ENCOUNTER — Telehealth: Payer: Self-pay | Admitting: *Deleted

## 2024-06-17 ENCOUNTER — Telehealth: Payer: Self-pay | Admitting: Internal Medicine

## 2024-06-17 NOTE — Telephone Encounter (Signed)
 Received a message from [employer] on behalf of patient stating they called [facility] to schedule EMG. They are booked until June for this test. She advised that [facility] told her that Ortho or the [facility] may be able to do this test. Requested call back at [phone].  Crystal I not on dpr and unable to speak with her.   Returned call to phone number requested. Patient was not with Crystal and she will call back when she is with him.

## 2024-06-17 NOTE — Telephone Encounter (Signed)
 Patient called requesting the referral for an EMG that Dr. Jeannetta is requesting be sent to:   [facility]  [address] [place]

## 2024-06-18 ENCOUNTER — Other Ambulatory Visit: Payer: Self-pay

## 2024-06-18 ENCOUNTER — Encounter: Payer: Self-pay | Admitting: Neurology

## 2024-06-18 ENCOUNTER — Other Ambulatory Visit: Payer: Self-pay | Admitting: *Deleted

## 2024-06-18 DIAGNOSIS — R531 Weakness: Secondary | ICD-10-CM

## 2024-06-18 DIAGNOSIS — R202 Paresthesia of skin: Secondary | ICD-10-CM

## 2024-06-25 ENCOUNTER — Ambulatory Visit: Payer: Self-pay | Admitting: Neurology

## 2024-06-25 DIAGNOSIS — M5416 Radiculopathy, lumbar region: Secondary | ICD-10-CM

## 2024-06-25 DIAGNOSIS — R202 Paresthesia of skin: Secondary | ICD-10-CM | POA: Diagnosis not present

## 2024-06-25 DIAGNOSIS — G629 Polyneuropathy, unspecified: Secondary | ICD-10-CM

## 2024-06-25 NOTE — Procedures (Signed)
 " [facility]  [address] [place], [address]  [place], [place] [address] Tel: [phone] Fax: [phone] Test Date:  06/25/2024  Patient: Isaac Reyes DOB: 10/09/1950 Physician: Tonita Blanch, DO  Sex: Male Height: 6' 0.5 Ref Phys: Lonni Ester, MD  ID#: [id number]   Technician:    History: This is a 74 year old man referred for evaluation of bilateral leg weakness.  NCV & EMG Findings: Extensive electrodiagnostic testing of the right lower extremity and additional studies of the left shows:  Bilateral sural and superficial peroneal sensory responses are absent. Right peroneal motor response is absent at the extensor digitorum brevis, and normal at the tibialis anterior.  Left peroneal and bilateral tibial motor responses are within normal limits. Bilateral tibial H reflex studies show prolonged latency. Chronic motor axon loss changes are seen affecting the right L5 myotome, as well as muscles below the knee bilaterally.  There is no evidence of accompanying active denervation.  Specifically, myopathic motor units are not seen in any of the tested muscles.  Of note, there is a global pattern of incomplete motor unit recruitment, which may be due to pain, poor effort, or central disorder of motor unit control.  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 Chronic right L5 radiculopathy, moderate. In particular, there is no evidence of a diffuse myopathy.   ___________________________ Tonita Blanch, DO    Nerve Conduction Studies   Stim Site NR Peak (ms) Norm Peak (ms) O-P Amp (V) Norm O-P Amp  Left Sup Peroneal Anti Sensory (Ant Lat Mall)  32 C  12 cm *NR  <4.6  >3  Right Sup Peroneal Anti Sensory (Ant Lat Mall)  32 C  12 cm *NR  <4.6  >3  Left Sural Anti Sensory (Lat Mall)  32 C  Calf *NR  <4.6  >3  Right Sural Anti Sensory (Lat Mall)  32 C  Calf *NR   <4.6  >3     Stim Site NR Onset (ms) Norm Onset (ms) O-P Amp (mV) Norm O-P Amp Site1 Site2 Delta-0 (ms) Dist (cm) Vel (m/s) Norm Vel (m/s)  Left Peroneal Motor (Ext Dig Brev)  32 C  Ankle    4.7 <6.0 3.0 >2.5 B Fib Ankle 9.7 39.0 40 >40  B Fib    14.4  2.9  Poplt B Fib 2.3 10.0 43 >40  Poplt    16.7  2.6         Right Peroneal Motor (Ext Dig Brev)  32 C  Ankle *NR  <6.0  >2.5 B Fib Ankle  0.0  >40  B Fib *NR     Poplt B Fib  0.0  >40  Poplt *NR            Left Peroneal TA Motor (Tib Ant)  32 C  Fib Head    3.2 <4.5 5.5 >3 Poplit Fib Head 1.6 10.0 63 >40  Poplit    4.8 <5.7 5.4         Right Peroneal TA Motor (Tib Ant)  32 C  Fib Head    3.8 <4.5 5.5 >3 Poplit Fib Head 1.7 10.0 59 >40  Poplit    5.5 <5.7 5.4         Left Tibial Motor (Abd Hall Brev)  32 C  Ankle    4.1 <6.0 4.1 >4 Knee Ankle 8.9 45.0 51 >40  Knee    13.0  3.2  Right Tibial Motor (Abd Hall Brev)  32 C  Ankle    5.8 <6.0 4.6 >4 Knee Ankle 11.3 45.0 40 >40  Knee    17.1  3.2          Electromyography   Side Muscle Ins.Act Fibs Fasc Recrt Amp Dur Poly Activation Comment  Right AntTibialis Nml Nml Nml *2- *1+ *1+ *1+ *Variable N/A  Right Gastroc Nml Nml Nml *2- *1+ *1+ *1+ *Variable N/A  Right Flex Dig Long Nml Nml Nml *1- *1+ *1+ *1+ *Variable N/A  Right RectFemoris Nml Nml Nml *1- *1+ *1+ *1+ *Variable N/A  Right BicepsFemS Nml Nml Nml Nml Nml Nml Nml *Variable N/A  Right GluteusMed Nml Nml Nml *2- *1+ *1+ *1+ *Variable N/A  Left AntTibialis Nml Nml Nml Nml Nml Nml Nml *Variable N/A  Left Gastroc Nml Nml Nml *1- *1+ *1+ *1+ *Variable N/A  Left Flex Dig Long Nml Nml Nml *1- *1+ *1+ *1+ *Variable N/A  Left RectFemoris Nml Nml Nml Nml Nml Nml Nml *Variable N/A  Left GluteusMed Nml Nml Nml Nml Nml Nml Nml *Variable N/A      Waveforms:                             "

## 2024-06-26 NOTE — Progress Notes (Signed)
 LMOM for patient to call the office for results

## 2024-06-26 NOTE — Progress Notes (Signed)
 Patient called the office back and was advised that Isaac Reyes's nerve study demonstrated a few abnormalities.   He has peripheral neuropathy affecting the feeling in his lower legs and feet. He has some decreased nerve signal to his muscles probably related to previous stroke. There was also decreased signal by the L5 nerve which is usually coming from the low back.   There was no indication of current muscle inflammation by this study. Along with the normal muscle enzyme numbers this is reassuring against having myositis. The Antibody test we saw before was probably a false positive related to his prostate cancer.   Is he already in physical therapy or seeing them within the past 6 months? If not we should refer him back to PT for this problem. If so, I recommend referral to physical medicine and rehabilitation for further management advice.    Patient started Physical Therapy but with the legs being so weak atter 2 visits they wanted to wait till the nerve study was done and see what the next recommendations were. Patient agrees to the referral to physical medicine and rehabilitation .
